# Patient Record
Sex: Male | Born: 1965
Health system: Southern US, Community
[De-identification: ages and names within clinical notes are randomized; demographics above are authoritative.]

## PROBLEM LIST (undated history)

## (undated) DIAGNOSIS — Z720 Tobacco use: Secondary | ICD-10-CM

## (undated) DIAGNOSIS — E785 Hyperlipidemia, unspecified: Secondary | ICD-10-CM

## (undated) DIAGNOSIS — E781 Pure hyperglyceridemia: Secondary | ICD-10-CM

## (undated) DIAGNOSIS — I878 Other specified disorders of veins: Secondary | ICD-10-CM

## (undated) DIAGNOSIS — I10 Essential (primary) hypertension: Secondary | ICD-10-CM

## (undated) DIAGNOSIS — I677 Cerebral arteritis, not elsewhere classified: Secondary | ICD-10-CM

## (undated) DIAGNOSIS — R519 Headache, unspecified: Secondary | ICD-10-CM

## (undated) DIAGNOSIS — I639 Cerebral infarction, unspecified: Secondary | ICD-10-CM

## (undated) DIAGNOSIS — J449 Chronic obstructive pulmonary disease, unspecified: Secondary | ICD-10-CM

## (undated) DIAGNOSIS — F419 Anxiety disorder, unspecified: Secondary | ICD-10-CM

## (undated) DIAGNOSIS — G8929 Other chronic pain: Secondary | ICD-10-CM

## (undated) DIAGNOSIS — R51 Headache: Secondary | ICD-10-CM

## (undated) DIAGNOSIS — G4730 Sleep apnea, unspecified: Secondary | ICD-10-CM

## (undated) DIAGNOSIS — K5190 Ulcerative colitis, unspecified, without complications: Secondary | ICD-10-CM

## (undated) DIAGNOSIS — M1990 Unspecified osteoarthritis, unspecified site: Secondary | ICD-10-CM

## (undated) HISTORY — DX: Pure hyperglyceridemia: E78.1

## (undated) HISTORY — DX: Other specified disorders of veins: I87.8

## (undated) HISTORY — DX: Hyperlipidemia, unspecified: E78.5

## (undated) HISTORY — DX: Headache: R51

## (undated) HISTORY — DX: Tobacco use: Z72.0

## (undated) HISTORY — DX: Other chronic pain: G89.29

## (undated) HISTORY — DX: Headache, unspecified: R51.9

---

## 2001-07-13 ENCOUNTER — Ambulatory Visit (HOSPITAL_COMMUNITY): Admission: RE | Admit: 2001-07-13 | Discharge: 2001-07-13 | Payer: Self-pay | Admitting: Family Medicine

## 2001-07-20 ENCOUNTER — Encounter: Payer: Self-pay | Admitting: *Deleted

## 2001-07-20 ENCOUNTER — Emergency Department (HOSPITAL_COMMUNITY): Admission: EM | Admit: 2001-07-20 | Discharge: 2001-07-20 | Payer: Self-pay | Admitting: Emergency Medicine

## 2001-12-01 ENCOUNTER — Inpatient Hospital Stay (HOSPITAL_COMMUNITY): Admission: RE | Admit: 2001-12-01 | Discharge: 2001-12-04 | Payer: Self-pay | Admitting: Family Medicine

## 2001-12-01 ENCOUNTER — Encounter: Payer: Self-pay | Admitting: Family Medicine

## 2002-07-15 HISTORY — PX: SHOULDER ARTHROSCOPY: SHX128

## 2002-08-12 ENCOUNTER — Encounter: Payer: Self-pay | Admitting: Family Medicine

## 2002-08-12 ENCOUNTER — Ambulatory Visit (HOSPITAL_COMMUNITY): Admission: RE | Admit: 2002-08-12 | Discharge: 2002-08-12 | Payer: Self-pay | Admitting: Family Medicine

## 2002-10-09 ENCOUNTER — Encounter: Admission: RE | Admit: 2002-10-09 | Discharge: 2002-10-09 | Payer: Self-pay | Admitting: Family Medicine

## 2002-10-27 ENCOUNTER — Ambulatory Visit (HOSPITAL_BASED_OUTPATIENT_CLINIC_OR_DEPARTMENT_OTHER): Admission: RE | Admit: 2002-10-27 | Discharge: 2002-10-27 | Payer: Self-pay | Admitting: Orthopedic Surgery

## 2004-03-09 ENCOUNTER — Emergency Department (HOSPITAL_COMMUNITY): Admission: EM | Admit: 2004-03-09 | Discharge: 2004-03-10 | Payer: Self-pay | Admitting: *Deleted

## 2005-02-22 ENCOUNTER — Ambulatory Visit: Payer: Self-pay | Admitting: Gastroenterology

## 2005-07-31 ENCOUNTER — Ambulatory Visit (HOSPITAL_COMMUNITY): Admission: RE | Admit: 2005-07-31 | Discharge: 2005-07-31 | Payer: Self-pay | Admitting: Family Medicine

## 2005-09-05 ENCOUNTER — Ambulatory Visit (HOSPITAL_COMMUNITY): Admission: RE | Admit: 2005-09-05 | Discharge: 2005-09-05 | Payer: Self-pay | Admitting: Family Medicine

## 2005-09-16 ENCOUNTER — Ambulatory Visit: Admission: RE | Admit: 2005-09-16 | Discharge: 2005-09-16 | Payer: Self-pay | Admitting: Orthopedic Surgery

## 2005-11-21 ENCOUNTER — Ambulatory Visit (HOSPITAL_COMMUNITY): Admission: RE | Admit: 2005-11-21 | Discharge: 2005-11-21 | Payer: Self-pay | Admitting: Orthopedic Surgery

## 2006-04-01 ENCOUNTER — Inpatient Hospital Stay (HOSPITAL_COMMUNITY): Admission: RE | Admit: 2006-04-01 | Discharge: 2006-04-02 | Payer: Self-pay | Admitting: Orthopedic Surgery

## 2006-04-01 ENCOUNTER — Encounter (INDEPENDENT_AMBULATORY_CARE_PROVIDER_SITE_OTHER): Payer: Self-pay | Admitting: *Deleted

## 2006-07-15 HISTORY — PX: SHOULDER ARTHROSCOPY: SHX128

## 2006-07-15 HISTORY — PX: CERVICAL SPINE SURGERY: SHX589

## 2006-07-24 ENCOUNTER — Ambulatory Visit: Payer: Self-pay | Admitting: Internal Medicine

## 2006-09-10 ENCOUNTER — Ambulatory Visit (HOSPITAL_COMMUNITY): Admission: RE | Admit: 2006-09-10 | Discharge: 2006-09-10 | Payer: Self-pay | Admitting: Family Medicine

## 2007-04-13 ENCOUNTER — Ambulatory Visit (HOSPITAL_COMMUNITY): Admission: RE | Admit: 2007-04-13 | Discharge: 2007-04-13 | Payer: Self-pay | Admitting: Family Medicine

## 2008-10-17 ENCOUNTER — Ambulatory Visit (HOSPITAL_COMMUNITY): Admission: RE | Admit: 2008-10-17 | Discharge: 2008-10-17 | Payer: Self-pay | Admitting: Family Medicine

## 2010-08-05 ENCOUNTER — Encounter: Payer: Self-pay | Admitting: Orthopedic Surgery

## 2010-09-04 ENCOUNTER — Other Ambulatory Visit (HOSPITAL_COMMUNITY): Payer: Self-pay | Admitting: Family Medicine

## 2010-09-04 DIAGNOSIS — J410 Simple chronic bronchitis: Secondary | ICD-10-CM

## 2010-09-05 ENCOUNTER — Ambulatory Visit (HOSPITAL_COMMUNITY)
Admission: RE | Admit: 2010-09-05 | Discharge: 2010-09-05 | Disposition: A | Discharge status: Home / Self Care | Payer: Medicare Other | Source: Ambulatory Visit | Attending: Family Medicine | Admitting: Family Medicine

## 2010-09-05 DIAGNOSIS — R0602 Shortness of breath: Secondary | ICD-10-CM | POA: Insufficient documentation

## 2010-09-05 DIAGNOSIS — R509 Fever, unspecified: Secondary | ICD-10-CM | POA: Insufficient documentation

## 2010-09-05 DIAGNOSIS — R0789 Other chest pain: Secondary | ICD-10-CM | POA: Insufficient documentation

## 2010-09-05 DIAGNOSIS — R059 Cough, unspecified: Secondary | ICD-10-CM | POA: Insufficient documentation

## 2010-09-05 DIAGNOSIS — J410 Simple chronic bronchitis: Secondary | ICD-10-CM

## 2010-09-05 DIAGNOSIS — R9389 Abnormal findings on diagnostic imaging of other specified body structures: Secondary | ICD-10-CM | POA: Insufficient documentation

## 2010-09-05 DIAGNOSIS — R05 Cough: Secondary | ICD-10-CM | POA: Insufficient documentation

## 2010-09-17 ENCOUNTER — Ambulatory Visit (HOSPITAL_COMMUNITY)
Admission: RE | Admit: 2010-09-17 | Discharge: 2010-09-17 | Disposition: A | Discharge status: Home / Self Care | Payer: Medicare Other | Source: Ambulatory Visit | Attending: Family Medicine | Admitting: Family Medicine

## 2010-09-17 ENCOUNTER — Other Ambulatory Visit (HOSPITAL_COMMUNITY): Payer: Self-pay | Admitting: Family Medicine

## 2010-09-17 DIAGNOSIS — R0602 Shortness of breath: Secondary | ICD-10-CM | POA: Insufficient documentation

## 2010-09-17 DIAGNOSIS — R51 Headache: Secondary | ICD-10-CM | POA: Insufficient documentation

## 2010-09-17 DIAGNOSIS — R05 Cough: Secondary | ICD-10-CM | POA: Insufficient documentation

## 2010-09-17 DIAGNOSIS — R059 Cough, unspecified: Secondary | ICD-10-CM | POA: Insufficient documentation

## 2010-09-17 DIAGNOSIS — R4182 Altered mental status, unspecified: Secondary | ICD-10-CM | POA: Insufficient documentation

## 2010-09-17 DIAGNOSIS — R0600 Dyspnea, unspecified: Secondary | ICD-10-CM

## 2010-09-17 LAB — BLOOD GAS, ARTERIAL
Acid-Base Excess: 4.6 mmol/L — ABNORMAL HIGH (ref 0.0–2.0)
Bicarbonate: 29.3 mEq/L — ABNORMAL HIGH (ref 20.0–24.0)
O2 Saturation: 92.3 %
Patient temperature: 37
TCO2: 25.8 mmol/L (ref 0–100)
pCO2 arterial: 49.3 mmHg — ABNORMAL HIGH (ref 35.0–45.0)
pH, Arterial: 7.391 (ref 7.350–7.450)
pO2, Arterial: 81.7 mmHg (ref 80.0–100.0)

## 2010-09-21 ENCOUNTER — Other Ambulatory Visit (HOSPITAL_COMMUNITY): Payer: Medicare Other

## 2010-11-30 NOTE — H&P (Signed)
[facility]  Patient:    Shaun Harris, Shaun Harris Visit Number: [id number] MRN: [id number]          Service Type: MED Location: 3A A302 01 Attending Physician:  Harlow Asa Dictated by:   Donna Bernard, M.D. Admit Date:  12/01/2001                           History and Physical  CHIEF COMPLAINT:  Sugar is out of control.  Serious dental and gum infection not responding.  HISTORY OF PRESENT ILLNESS:  This patient is a 45 year old white male with a complicated medical history including type 2 diabetes with significant insulin resistance, hypertension, chronic headaches rendering disability, ulcerative colitis, asthma, proteinuria, and hyperlipidemia.  He arrived at the office on the day of admission stating that his sugars have mostly been in the range of 350-500 over the past couple of weeks.  He currently is on Novolin 70/30 up to 100 units q.a.m., 85 units q.h.s.  Despite this, he reports sugars basically through the roof.  For the past month he has been in and out of the dental clinic at [facility] due to a serious gum and dental infection in his upper maxilla.  He is currently on oral clindamycin.  Despite this he continues to manifest infection and ______ until his sugars get in tight control he is going to have ongoing tremendous problems.  The patient claims compliance with his medications.  MEDICATIONS:  1. Asacol 1200 mg b.i.d.  2. D2 200 mg b.i.d.  3. Hydrocodone p.r.n.  4. Glucotrol XL 10 mg b.i.d.  5. Norvasc 5 mg b.i.d.  6. Novolin 70/30 100 units q.a.m., 85 units q.p.m.  7. Magnesium supplement 400 mg daily.  8. Zanaflex 400 mg 2 t.i.d.  9. Lipitor 20 mg daily. 10. Keppra 500 mg 3 b.i.d. 11. Tricor 67 mg b.i.d. 12. Actos 45 mg daily. 13. Xanax 1 mg q.h.s. p.r.n. 14. Hyoscyamine p.r.n.  PAST MEDICAL HISTORY:  Significant for as noted above.  PAST SURGICAL HISTORY:  Recent surgery for gum and maxillary infection.  FAMILY HISTORY:   Positive for hypertension, diabetes, coronary artery disease.  ALLERGIES:  No true allergies.  SOCIAL HISTORY:  The patient is disabled.  Married.  Smokes a pack a day. Three children.  REVIEW OF SYSTEMS:  Otherwise negative.  PHYSICAL EXAMINATION:  VITAL SIGNS:  BP 150/96.  Sugar 320.  GENERAL:  The patient is alert, in moderate malaise.  HEENT:  Mouth dry.  TMs normal.  Fundi, discs sharp.  Gum, inflammation and infection noted.  Pharynx normal.  NECK:  Supple.  LUNGS:  Rare wheeze.  Significant obesity.  HEART:  Regular rate and rhythm.  ABDOMEN:  Soft.  No significant tenderness.  EXTREMITIES:  No edema.  LABORATORY DATA:  Glucose in the office 350.  Other laboratories pending.  IMPRESSION: 1. Type 2 diabetes, basically out of control with tremendous insulin    resistance. 2. Odontogenic infection with patients dental oral surgeons advising the    patient needs to get #1 in control before they can get #2 in control. 3. Hypertension. 4. Chronic disabling headaches. 5. Ulcerative colitis. 6. Hyperlipidemia.  PLAN:  As per orders. Dictated by:   Donna Bernard, M.D. Attending Physician:  Harlow Asa DD:  12/02/01 TD:  12/03/01 Job: [id number]

## 2010-11-30 NOTE — Discharge Summary (Signed)
   NAME:  Shaun Harris, Shaun Harris NO.:  [id number]   MEDICAL RECORD NO.:  [id number]                  PATIENT TYPE:   LOCATION:                                       FACILITY:   PHYSICIAN:  Donna Bernard, M.D.             DATE OF BIRTH:   DATE OF ADMISSION:  DATE OF DISCHARGE:  12/04/2001                                 DISCHARGE SUMMARY   FINAL DIAGNOSES:  1. Direct bilirubin out of control, unresponsive outpatient management.  2. Odontogenic infection.  3. Hypertension.  4. Chronic disabling headaches.   DISPOSITION:  1. The patient discharged to home.  2. Discharge medications:     a. Oral clindamycin as directed.     b. Maintain all chronic medicines.     c. Initiate Lantus q.h.s. 36 units along with sliding scale Humalog for        meals.   ADMISSION HISTORY AND PHYSICAL:  Please see H&P as dictated.   HOSPITAL COURSE:  This patient is a 45 year old white male with history of  complicated type 2 diabetes with significant insulin-resistance,  hypertension, chronic headaches, asthma, who by the office day of admission  with complaints of sugars ranging from 300-500.  He has been in and out with  the dental surgeons due to odontogenic infection.  Currently, he is on oral  clindamycin and they feel until his sugars get better he is going to have no  significant response, as far as his infection.  Based on all this, the  patient was admitted to the hospital.  He was switched over to Lantus at  nighttime, along with Humulog sliding scale.  Within 48-72 hours, his sugars  had improved considerably.  Today, day of discharge, the patient is better  and will be discharged home with diagnosis and disposition as noted above.                                               Donna Bernard, M.D.    Karie Chimera  D:  05/03/2002  T:  05/03/2002  Job:  [id number]

## 2010-11-30 NOTE — Op Note (Signed)
NAME:  Shaun Harris, Shaun Harris NO.:  [id number]   MEDICAL RECORD NO.:  [id number]          PATIENT TYPE:  INP   LOCATION:  5003                         FACILITY:  [facility]   PHYSICIAN:  Nelda Severe, MD      DATE OF BIRTH:  May 29, 1966   DATE OF PROCEDURE:  04/01/2006  DATE OF DISCHARGE:                                 OPERATIVE REPORT   SURGEON:  Nelda Severe, MD.   ASSISTANT:  Lianne Cure, P.A.   PREOPERATIVE DIAGNOSIS:  C6-7 right-sided disk herniation.   POSTOPERATIVE DIAGNOSIS:  C6-7 right-sided foraminal stenosis (hard disk).   OPERATIVE PROCEDURE:  Anterior C6-7 decompression and fusion using an  interbody cage, Vitoss (beta tricalcium phosphate), bone marrow aspirate,  anterior screws and plate.   OPERATIVE NOTE:  The patient was placed under general endotracheal  anesthetic.  No Foley catheter was inserted.  Bilateral sequential  compression devices were placed.  He had received a gram of vancomycin  intravenously.  He was positioned supine on a regular operating table with a  folded sheet under the scapula with the head being supported on foam and  with a donut ring so as to extend the neck.  A cross-table lateral  radiograph was taken prior to preparation and draping with a needle taped to  the skin to establish level.  The decision as to where to make the incision  was somewhat critical in this individual, who has a very thick, short neck.  I knew that with a C6-7 fusion we would be unable to get a satisfactory  lateral radiograph confirming the actual space with a marker in the C6-7  space.  Therefore, we would need to place a marker proximally and then able  to count down.  Therefore, I wanted to be able to expose as far proximally  as C4-5 and, hopefully, C3-4 in order to ensure that a needle could be  placed in the disk and seen on a cross-table lateral.   Therefore, we made a transverse incision to the left side from the midline  laterally a  distance of about 4-5 cm at what was judged to be approximately  the C5-6 level.  The platysma was cut in line with the incision.  Some small  tributaries of the external jugular vein were bipolar cauterized and one  large one ligated with 2-0 Vicryl.  The interval between the strap muscles  of the neck and the sternocleidomastoid was identified and the deep cervical  fascia bluntly dissected.  The carotid sheath was identified and retracted  laterally.  The midline structures were retracted to the right side.  The  longus coli muscles were identified as well as the midline.  We then exposed  somewhat more proximally, placed a needle in the most proximal disk we could  expose and took a cross-table lateral radiograph, which turned out to be C3-  4.  We then counted down and identified the C6-7 level.  Exposure was fairly  difficult because the wound was extremely deep at this point.  This C6-7  level was distal to the  apex of the lordotic curve of the cervical spine.  Eventually Shadow Line self-retaining retractors were adequately placed so  as to expose the annulus of C6-7.  We did take several other x-rays to  confirm that we had counted correctly, etc.  However, never was an x-ray  taken in which a needle was placed in the C6-7 disk space, as this disk  space was literally not visible on a cross-table lateral radiograph because  of overshadowing by his shoulders.   The annulus of the disk was then excised and some of the nucleus curetted  out.  The disk fragments were submitted as specimen.  Ultimately we used a  osteotome to remove the anterior inferior lip of the C6-7 vertebra so as to  gain better exposure towards the back of the disk.  Disk was then enucleated  and removed with pituitary rongeurs and curettes and lateral disk with  Kerrison rongeurs to a point where the disk space became very narrow  posteriorly.  At this point a high-speed bur was used to begin removing bone   primarily from the upper portion of C7 on the right side posteriorly.  It  became evident at this point that there was a large posterior spondylophyte  and that we were drilling very far posteriorly.  Ultimately we were able to  get a nerve hook behind a shell of bone and remove it with a 2-mm Kerrison  rongeur.  We worked our way to the left side and, in fact, the  intervertebral space was significantly shallower on the left.  Ultimately we  removed all of what appeared to be the obstructing the bony structure using  Kerrison rongeurs and doing good foraminal decompression.  We decompressed  back to dura on both sides but did not do a foraminotomy on the left side,  which was asymptomatic.   We also curetted away endplate cartilage from the superior endplate of C7.  We then had two well prepared, raw bleeding bony surfaces on the adjacent  vertebra.   We then chose a 10 mm, 7 degree lordotic intervertebral cage implant  (Invictus), and the trial fit nicely.  At this point another x-ray was taken  with a needle in the immediately proximal disk space C5-6, showing that we  were in the correct disk space.  The x-ray did not reveal the trial at C6-7  however.   We then aspirated approximately 5 mL of bone marrow from the body of C6  using an 18 gauge spinal needle.  This was mixed with 5 mL of beta  tricalcium phosphate (Vitoss).  This was then placed in a 10 mm x 7 degree  cage and the cage impacted carefully and countersunk about 2 mm.   Next a one-level plate was chosen and further contoured and fixed to the  bone of C6 above and C7 below using self-drilling 14 mm screws.  A cross-  table lateral radiograph was taken, but the plate and screws were not  visible.   The wound was then closed in layers using 2-0 interrupted Vicryl suture in  the platysma and 3-0 undyed Vicryl in subcuticular fashion in the skin. Prior to closure a quarter-inch Penrose drain was placed in the depths of   the wound and brought out through the incision medially and secured with a 4-  0 nylon suture.  Skin edges were reinforced with Steri-Strips.  A gauze  bandage was then applied and secured with a towel around the neck.  There were no intraoperative complications.  The blood loss was estimated at  150 mL maximum.  The patient awakened and was able to move all four  extremities to command.  In the recovery room he stated that he did not have  any appreciable pain in his right arm.   Sponge and needle counts were correct.      Nelda Severe, MD  Electronically Signed     MT/MEDQ  D:  04/01/2006  T:  04/02/2006  Job:  [id number]

## 2010-11-30 NOTE — Discharge Summary (Signed)
NAME:  Shaun Harris, DZIEDZIC NO.:  [id number]   MEDICAL RECORD NO.:  [id number]          PATIENT TYPE:  INP   LOCATION:  5003                         FACILITY:  [facility]   PHYSICIAN:  Nelda Severe, MD      DATE OF BIRTH:  11/30/1965   DATE OF ADMISSION:  04/01/2006  DATE OF DISCHARGE:  04/02/2006                                 DISCHARGE SUMMARY   ADMISSION DIAGNOSIS:  C6, C7 herniated nucleus pulposus.   POSTOP DIAGNOSIS:  Cervical C6, C7 fusion anterior cervical spine.   COURSE OF STAY:  Postoperatively, Mr. Cowles was admitted to 5000.  He was  placed on medications as per home use.  He was placed morphine 1 to 4 mg IVP  q.2 hour p.r.n. for pain.  He was also placed on hydrocodone 10 mg/325,  Tylenol 1 to 2 p.o. q.4 hours once on p.o.  There was a Penrose drain placed  on the neck postoperatively and cleaned well.  Dressing was placed over the  dressing and held in place a green surgical sterile towel.  Postoperatively  in the PACU unit, he was actively both bilateral upper and lower  extremities.  Postoperative day 1, patient was ambulating with assistance  from his wife secondary to the IV being connected to him.  He reports  soreness with swallowing.  His speech is clear.  He has been drinking plenty  of fluids over night.  He complains of decreased feeling in the thumb and  pointer finger, which is C5.  No numbness at the level of C6-C7, which was  numb on the right upper extremity prior to surgery.  He states he has  urinated without difficulties.  He is comfortable on pain medicine.  We  ordered an Aspen collar for him to wear when he is out at the house.  He  does have a soft cervical collar at bedside that can be worn for comfort  only, but it is not necessary to wear it at all times.  The Penrose drain  was removed, patient tolerated this well.  A clean, dry dressing was placed  back on his neck.  He and his wife are instructed to change this dressing as  needed  when saturated and he may shower and we want him to followup in  approximately 2 to 3 weeks.  He had hydrocodone at home to be used for pain  control.  He has got 10 of the 650s.  He can take one q.4 p.r.n. for pain  control.  He is welcome to call our office if he needs a refill on his pain  medications.  He had this at home secondary to being treated for headaches  from primary care physician.   DISPOSITION ON DISCHARGE:  Is stable.   DIET:  Advance as tolerate.      Lianne Cure, P.A.      Nelda Severe, MD  Electronically Signed    MC/MEDQ  D:  04/02/2006  T:  04/02/2006  Job:  [id number]

## 2010-11-30 NOTE — Op Note (Signed)
NAME:  Shaun Harris, Shaun Harris                           ACCOUNT NO.:  [id number]   MEDICAL RECORD NO.:  [id number]                   PATIENT TYPE:  AMB   LOCATION:  DSC                                  FACILITY:  [facility]   PHYSICIAN:  Feliberto Gottron. Turner Daniels, M.D.                DATE OF BIRTH:  June 06, 1966   DATE OF PROCEDURE:  10/27/2002  DATE OF DISCHARGE:                                 OPERATIVE REPORT   PREOPERATIVE DIAGNOSES:  Left shoulder impingement with severe  acromioclavicular joint arthritis.   POSTOPERATIVE DIAGNOSES:  Left shoulder impingement with severe  acromioclavicular joint arthritis.   OPERATION PERFORMED:  Left shoulder arthroscopic distal clavicle excision,  anterior-inferior acromioplasty and subacromial bursectomy.   SURGEON:  Feliberto Gottron. Turner Daniels, M.D.   ASSISTANTLaural Benes. Jannet Mantis.   ANESTHESIA:  Attempted interscalene block followed by general endotracheal.   ESTIMATED BLOOD LOSS:  Minimal.   FLUIDS REPLACED:  of crystalloid.   DRAINS:  None.   TOURNIQUET TIME:  None.   INDICATIONS FOR PROCEDURE:  The patient is a 45 year old gentleman with  significant diabetes who has been treated by one of my partners, Otho Darner, M.D. for fairly impressive left shoulder acromioclavicular joint  arthritis.  He only got partial temporary relief from an acromioclavicular  joint injection a few weeks ago, had severe pain that is refractory to anti-  inflammatory medicines, stretching exercises and of course, the cortisone  injections.  Because of severe pain that keeps him up seven of seven nights  per week and because of an MRI scan that clearly shows severe  acromioclavicular joint arthritis which is where he is tender with an intact  rotator cuff, he is taken for arthroscopic distal clavicle excision,  evaluation of the rotator cuff and subacromial decompression.  The risks and  benefits of surgery were understood by the patient.   DESCRIPTION OF PROCEDURE:   The patient was identified by arm band and taken  to the operating room at [facility] where appropriate  anesthetic monitors are attached and an attempt at interscalene block  anesthesia was made.  This failed and he then underwent general endotracheal  anesthesia.  The patient was placed in the beach chair position and the left  upper extremity was prepped and draped in the usual sterile fashion from the  wrist to the hemithorax.  The skin along the anterolateral and posterior  aspects of the acromion process was infiltrated with 3mL of 0.5% Marcaine  with epinephrine solution at each quadrant and then using a #11 blade,  standard portals were made 1.5 cm anterior to the acromioclavicular joint,  lateral to the junction of the middle and posterior thirds of the acromion  and posterior to the posterolateral corner of the acromion process.  The  inflow was placed anteriorly, the arthroscope laterally and a 4.2 great  white sucker  shaver posteriorly allowing visualization of the subacromial  bursa which was then resected.  Small bleeders were cauterized using the  underwater electrocautery.  Using a 4.5 hooded Vortex bur, very significant  subclavicular spurs were then removed and the anterior inferior acromion was  coplaned down to a type 1 acromion.  This allowed better visualization of  the rotator cuff which was noted to be intact consistent with the MRI scan.  The [facility] joint was completely denuded of cartilage, bare bone rubbing on bare  bone and we then removed the distal inferior 1 cm of the distal clavicle  using the posterior portal and then swapped portals bringing the scope in  posteriorly, the Vortex in anteriorly and the water in laterally allowing  completion of the distal clavicle excision and photographic documentation  was made of this as well as the acromioplasty. The arthroscope was then  repositioned into the glenohumeral joint itself using a posterior  portal  where we documented an intact labrum, biceps anchor, biceps tendon, subscap  tendon, supraspinatus and infraspinatus insertions were all noted to be  intact.  The shoulder was then washed out with normal saline solution.  The  arthroscopic instruments were removed.  A dressing of Xeroform, 4 x 4  dressing sponges and paper tape applied, followed by a sling.  Patient  awakened and taken to the recovery room without difficulty.                                                  Feliberto Gottron. Turner Daniels, M.D.    Ovid Curd  D:  10/27/2002  T:  10/27/2002  Job:  [id number]

## 2011-10-01 DIAGNOSIS — E119 Type 2 diabetes mellitus without complications: Secondary | ICD-10-CM | POA: Diagnosis not present

## 2011-10-01 DIAGNOSIS — J449 Chronic obstructive pulmonary disease, unspecified: Secondary | ICD-10-CM | POA: Diagnosis not present

## 2011-10-01 DIAGNOSIS — I10 Essential (primary) hypertension: Secondary | ICD-10-CM | POA: Diagnosis not present

## 2011-10-01 DIAGNOSIS — G8929 Other chronic pain: Secondary | ICD-10-CM | POA: Diagnosis not present

## 2012-01-02 DIAGNOSIS — E119 Type 2 diabetes mellitus without complications: Secondary | ICD-10-CM | POA: Diagnosis not present

## 2012-01-02 DIAGNOSIS — E785 Hyperlipidemia, unspecified: Secondary | ICD-10-CM | POA: Diagnosis not present

## 2012-01-02 DIAGNOSIS — M25569 Pain in unspecified knee: Secondary | ICD-10-CM | POA: Diagnosis not present

## 2012-01-02 DIAGNOSIS — I10 Essential (primary) hypertension: Secondary | ICD-10-CM | POA: Diagnosis not present

## 2012-02-11 ENCOUNTER — Other Ambulatory Visit (HOSPITAL_COMMUNITY): Payer: Self-pay | Admitting: Orthopaedic Surgery

## 2012-02-11 DIAGNOSIS — M25569 Pain in unspecified knee: Secondary | ICD-10-CM

## 2012-02-13 ENCOUNTER — Ambulatory Visit (HOSPITAL_COMMUNITY)
Admission: RE | Admit: 2012-02-13 | Discharge: 2012-02-13 | Disposition: A | Discharge status: Home / Self Care | Payer: Medicare Other | Source: Ambulatory Visit | Attending: Orthopaedic Surgery | Admitting: Orthopaedic Surgery

## 2012-02-13 DIAGNOSIS — M23329 Other meniscus derangements, posterior horn of medial meniscus, unspecified knee: Secondary | ICD-10-CM | POA: Diagnosis not present

## 2012-02-13 DIAGNOSIS — M25569 Pain in unspecified knee: Secondary | ICD-10-CM | POA: Diagnosis not present

## 2012-02-18 DIAGNOSIS — M25569 Pain in unspecified knee: Secondary | ICD-10-CM | POA: Diagnosis not present

## 2012-02-18 DIAGNOSIS — IMO0002 Reserved for concepts with insufficient information to code with codable children: Secondary | ICD-10-CM | POA: Diagnosis not present

## 2012-02-21 ENCOUNTER — Encounter (HOSPITAL_COMMUNITY): Payer: Self-pay

## 2012-02-21 ENCOUNTER — Ambulatory Visit (HOSPITAL_COMMUNITY)
Admission: RE | Admit: 2012-02-21 | Discharge: 2012-02-21 | Disposition: A | Discharge status: Home / Self Care | Payer: Medicare Other | Source: Ambulatory Visit | Attending: Orthopaedic Surgery | Admitting: Orthopaedic Surgery

## 2012-02-21 ENCOUNTER — Encounter (HOSPITAL_COMMUNITY)
Admission: RE | Admit: 2012-02-21 | Discharge: 2012-02-21 | Disposition: A | Discharge status: Home / Self Care | Payer: Medicare Other | Source: Ambulatory Visit | Attending: Orthopaedic Surgery | Admitting: Orthopaedic Surgery

## 2012-02-21 DIAGNOSIS — M23305 Other meniscus derangements, unspecified medial meniscus, unspecified knee: Secondary | ICD-10-CM | POA: Diagnosis not present

## 2012-02-21 DIAGNOSIS — R0902 Hypoxemia: Secondary | ICD-10-CM | POA: Diagnosis not present

## 2012-02-21 DIAGNOSIS — I10 Essential (primary) hypertension: Secondary | ICD-10-CM | POA: Diagnosis not present

## 2012-02-21 DIAGNOSIS — Z01810 Encounter for preprocedural cardiovascular examination: Secondary | ICD-10-CM | POA: Diagnosis not present

## 2012-02-21 DIAGNOSIS — Z794 Long term (current) use of insulin: Secondary | ICD-10-CM | POA: Diagnosis not present

## 2012-02-21 DIAGNOSIS — J449 Chronic obstructive pulmonary disease, unspecified: Secondary | ICD-10-CM | POA: Diagnosis not present

## 2012-02-21 DIAGNOSIS — Z6841 Body Mass Index (BMI) 40.0 and over, adult: Secondary | ICD-10-CM | POA: Diagnosis not present

## 2012-02-21 DIAGNOSIS — E119 Type 2 diabetes mellitus without complications: Secondary | ICD-10-CM | POA: Diagnosis not present

## 2012-02-21 DIAGNOSIS — G4733 Obstructive sleep apnea (adult) (pediatric): Secondary | ICD-10-CM | POA: Diagnosis not present

## 2012-02-21 DIAGNOSIS — Z01812 Encounter for preprocedural laboratory examination: Secondary | ICD-10-CM | POA: Diagnosis not present

## 2012-02-21 DIAGNOSIS — Z79899 Other long term (current) drug therapy: Secondary | ICD-10-CM | POA: Diagnosis not present

## 2012-02-21 HISTORY — DX: Sleep apnea, unspecified: G47.30

## 2012-02-21 HISTORY — DX: Essential (primary) hypertension: I10

## 2012-02-21 HISTORY — DX: Chronic obstructive pulmonary disease, unspecified: J44.9

## 2012-02-21 HISTORY — DX: Ulcerative colitis, unspecified, without complications: K51.90

## 2012-02-21 HISTORY — DX: Anxiety disorder, unspecified: F41.9

## 2012-02-21 LAB — URINALYSIS, ROUTINE W REFLEX MICROSCOPIC
Bilirubin Urine: NEGATIVE
Glucose, UA: 250 mg/dL — AB
Hgb urine dipstick: NEGATIVE
Ketones, ur: NEGATIVE mg/dL
Leukocytes, UA: NEGATIVE
Nitrite: NEGATIVE
Protein, ur: NEGATIVE mg/dL
Specific Gravity, Urine: 1.025 (ref 1.005–1.030)
Urobilinogen, UA: 0.2 mg/dL (ref 0.0–1.0)
pH: 6 (ref 5.0–8.0)

## 2012-02-21 LAB — COMPREHENSIVE METABOLIC PANEL
ALT: 16 U/L (ref 0–53)
AST: 15 U/L (ref 0–37)
Albumin: 4 g/dL (ref 3.5–5.2)
Alkaline Phosphatase: 97 U/L (ref 39–117)
BUN: 18 mg/dL (ref 6–23)
CO2: 31 mEq/L (ref 19–32)
Calcium: 9.5 mg/dL (ref 8.4–10.5)
Chloride: 100 mEq/L (ref 96–112)
Creatinine, Ser: 0.81 mg/dL (ref 0.50–1.35)
GFR calc Af Amer: >90 mL/min (ref >90)
GFR calc non Af Amer: >90 mL/min (ref >90)
Glucose, Bld: 201 mg/dL — ABNORMAL HIGH (ref 70–99)
Potassium: 4.8 mEq/L (ref 3.5–5.1)
Sodium: 139 mEq/L (ref 135–145)
Total Bilirubin: 0.2 mg/dL — ABNORMAL LOW (ref 0.3–1.2)
Total Protein: 6.7 g/dL (ref 6.0–8.3)

## 2012-02-21 LAB — CBC
HCT: 44 % (ref 39.0–52.0)
Hemoglobin: 14.4 g/dL (ref 13.0–17.0)
MCH: 30.3 pg (ref 26.0–34.0)
MCHC: 32.7 g/dL (ref 30.0–36.0)
MCV: 92.6 fL (ref 78.0–100.0)
Platelets: 200 10*3/uL (ref 150–400)
RBC: 4.75 MIL/uL (ref 4.22–5.81)
RDW: 13.6 % (ref 11.5–15.5)
WBC: 7.4 10*3/uL (ref 4.0–10.5)

## 2012-02-21 LAB — SURGICAL PCR SCREEN
MRSA, PCR: NEGATIVE
Staphylococcus aureus: NEGATIVE

## 2012-02-21 NOTE — Patient Instructions (Addendum)
20 Shaun Harris  02/21/2012   Your procedure is scheduled on:  02/25/2012  Report to [facility] at 7:15 am AM.  Call this number if you have problems the morning of surgery: [phone]   Remember:   Do not eat food:After Midnight.  May have clear liquids:until Midnight .  Clear liquids include soda, tea, black coffee, apple or grape juice, broth.  Take these medicines the morning of surgery with A SIP OF WATER: Vasotec, Advair, Xanax, Keppra Hytrin, Pro Air, Lantus (take 1/2 the dosage the night before)   Do not wear jewelry, make-up or nail polish.  Do not wear lotions, powders, or perfumes. You may wear deodorant.  Do not shave 48 hours prior to surgery. Men may shave face and neck.  Do not bring valuables to the hospital.  Contacts, dentures or bridgework may not be worn into surgery.  Leave suitcase in the car. After surgery it may be brought to your room.  For patients admitted to the hospital, checkout time is 11:00 AM the day of discharge.   Patients discharged the day of surgery will not be allowed to drive home.  Name and phone number of your driver: Wife Shaun Harris  [phone]  Special Instructions: CHG Shower Use Special Wash: 1/2 bottle night before surgery and 1/2 bottle morning of surgery.   Please read over the following fact sheets that you were given: Care and Recovery After Surgery

## 2012-02-24 NOTE — H&P (Signed)
Shaun Harris is an 46 y.o. male.   Chief Complaint: Right knee pain HPI: He has had right knee pain since January, getting worse.  He has had swelling and popping of the right knee.  It started giving way several months ago.  I first saw him in the office end of July of this year.  I was concerned about a medial meniscus tear.  MRI done on August 1st shows a tear of the medial meniscus of the right knee as well as degenerative changes of the joint.  I have recommended arthroscopy of the right knee.  Risks and imponderables were discussed.  He appeared to understand and agrees to the procedure as an outpatient.  PT will be done post op.  Past Medical History  Diagnosis Date  . Hypertension   . Diabetes mellitus   . COPD (chronic obstructive pulmonary disease)   . Anxiety   . Sleep apnea   . Ulcerative colitis     Past Surgical History  Procedure Date  . Cervical spine surgery 2008  . Shoulder arthroscopy 2004    left shoulder  . Shoulder arthroscopy 2008    right shoulder    No family history on file. Social History:  reports that he has been smoking Cigarettes.  He has a 70 pack-year smoking history. He does not have any smokeless tobacco history on file. He reports that he does not drink alcohol or use illicit drugs.  Allergies: No Known Allergies  No prescriptions prior to admission    No results found for this or any previous visit (from the past 48 hour(s)). No results found.  Review of Systems  Constitutional: Negative.   HENT: Negative.   Eyes: Negative.   Respiratory:       History of COPD  Cardiovascular:       History of hypertension  Gastrointestinal: Negative.   Musculoskeletal: Positive for joint pain (Pain right knee with giving way and popping and swelling.  He has no locking or redness.).  Skin: Negative.   Endo/Heme/Allergies:       History of diabetes.    There were no vitals taken for this visit. Physical Exam  Constitutional: He appears  well-developed and well-nourished.  HENT:  Head: Normocephalic and atraumatic.  Eyes: Conjunctivae and EOM are normal. Pupils are equal, round, and reactive to light.  Neck: Normal range of motion. Neck supple.  Cardiovascular: Normal rate, regular rhythm, normal heart sounds and intact distal pulses.   Respiratory: Effort normal and breath sounds normal.  GI: Soft. Bowel sounds are normal.  Musculoskeletal: He exhibits tenderness (Pain right knee, swelling, medial tenderness.).       Right knee: He exhibits decreased range of motion, swelling and effusion.       Legs: Neurological: He is alert. He has normal reflexes.  Skin: Skin is warm and dry.  Psychiatric: He has a normal mood and affect. His behavior is normal. Judgment and thought content normal.     Assessment/Plan Tear of the right knee medial meniscus.  History of diabetes.  For arthroscopy of the right knee as an outpatient.  Physical therapy scheduled later in the week.  Emily Massar 02/24/2012, 2:52 PM

## 2012-02-25 ENCOUNTER — Encounter (HOSPITAL_COMMUNITY): Payer: Self-pay | Admitting: Anesthesiology

## 2012-02-25 ENCOUNTER — Ambulatory Visit (HOSPITAL_COMMUNITY)
Admission: RE | Admit: 2012-02-25 | Discharge: 2012-02-25 | Disposition: A | Discharge status: Home / Self Care | Payer: Medicare Other | Source: Ambulatory Visit | Attending: Orthopaedic Surgery | Admitting: Orthopaedic Surgery

## 2012-02-25 ENCOUNTER — Encounter (HOSPITAL_COMMUNITY): Payer: Self-pay

## 2012-02-25 ENCOUNTER — Ambulatory Visit (HOSPITAL_COMMUNITY): Payer: Medicare Other | Admitting: Anesthesiology

## 2012-02-25 ENCOUNTER — Ambulatory Visit (HOSPITAL_COMMUNITY)
Admission: RE | Admit: 2012-02-25 | Discharge: 2012-02-25 | Disposition: A | Discharge status: Home / Self Care | Payer: Medicare Other | Source: Ambulatory Visit | Attending: Family Medicine | Admitting: Family Medicine

## 2012-02-25 ENCOUNTER — Encounter (HOSPITAL_COMMUNITY)
Admission: RE | Disposition: A | Discharge status: Home / Self Care | Payer: Self-pay | Source: Ambulatory Visit | Attending: Orthopaedic Surgery

## 2012-02-25 DIAGNOSIS — I10 Essential (primary) hypertension: Secondary | ICD-10-CM | POA: Diagnosis not present

## 2012-02-25 DIAGNOSIS — Z01810 Encounter for preprocedural cardiovascular examination: Secondary | ICD-10-CM | POA: Diagnosis not present

## 2012-02-25 DIAGNOSIS — IMO0001 Reserved for inherently not codable concepts without codable children: Secondary | ICD-10-CM | POA: Insufficient documentation

## 2012-02-25 DIAGNOSIS — M25669 Stiffness of unspecified knee, not elsewhere classified: Secondary | ICD-10-CM | POA: Insufficient documentation

## 2012-02-25 DIAGNOSIS — IMO0002 Reserved for concepts with insufficient information to code with codable children: Secondary | ICD-10-CM | POA: Diagnosis not present

## 2012-02-25 DIAGNOSIS — Z79899 Other long term (current) drug therapy: Secondary | ICD-10-CM | POA: Insufficient documentation

## 2012-02-25 DIAGNOSIS — M23305 Other meniscus derangements, unspecified medial meniscus, unspecified knee: Secondary | ICD-10-CM | POA: Diagnosis not present

## 2012-02-25 DIAGNOSIS — J449 Chronic obstructive pulmonary disease, unspecified: Secondary | ICD-10-CM | POA: Insufficient documentation

## 2012-02-25 DIAGNOSIS — M25569 Pain in unspecified knee: Secondary | ICD-10-CM | POA: Insufficient documentation

## 2012-02-25 DIAGNOSIS — M6281 Muscle weakness (generalized): Secondary | ICD-10-CM | POA: Diagnosis not present

## 2012-02-25 DIAGNOSIS — M23302 Other meniscus derangements, unspecified lateral meniscus, unspecified knee: Secondary | ICD-10-CM | POA: Diagnosis not present

## 2012-02-25 DIAGNOSIS — G4733 Obstructive sleep apnea (adult) (pediatric): Secondary | ICD-10-CM | POA: Insufficient documentation

## 2012-02-25 DIAGNOSIS — J4489 Other specified chronic obstructive pulmonary disease: Secondary | ICD-10-CM | POA: Insufficient documentation

## 2012-02-25 DIAGNOSIS — Z794 Long term (current) use of insulin: Secondary | ICD-10-CM | POA: Insufficient documentation

## 2012-02-25 DIAGNOSIS — R269 Unspecified abnormalities of gait and mobility: Secondary | ICD-10-CM | POA: Diagnosis not present

## 2012-02-25 DIAGNOSIS — Z6841 Body Mass Index (BMI) 40.0 and over, adult: Secondary | ICD-10-CM | POA: Insufficient documentation

## 2012-02-25 DIAGNOSIS — Z01812 Encounter for preprocedural laboratory examination: Secondary | ICD-10-CM | POA: Insufficient documentation

## 2012-02-25 DIAGNOSIS — E119 Type 2 diabetes mellitus without complications: Secondary | ICD-10-CM | POA: Insufficient documentation

## 2012-02-25 HISTORY — PX: KNEE ARTHROSCOPY: SHX127

## 2012-02-25 LAB — GLUCOSE, CAPILLARY
Glucose-Capillary: 97 mg/dL (ref 70–99)
Glucose-Capillary: 121 mg/dL — ABNORMAL HIGH (ref 70–99)

## 2012-02-25 SURGERY — ARTHROSCOPY, KNEE
Anesthesia: General | Site: Knee | Laterality: Right | Wound class: Clean

## 2012-02-25 MED ORDER — LACTATED RINGERS IV SOLN
INTRAVENOUS | Status: DC
  Administered 2012-02-25: 08:00:00 via INTRAVENOUS

## 2012-02-25 MED ORDER — DEXAMETHASONE SODIUM PHOSPHATE 4 MG/ML IJ SOLN
INTRAMUSCULAR | Status: AC
  Filled 2012-02-25: qty 1

## 2012-02-25 MED ORDER — GLYCOPYRROLATE 0.2 MG/ML IJ SOLN
INTRAMUSCULAR | Status: AC
  Filled 2012-02-25: qty 1

## 2012-02-25 MED ORDER — LACTATED RINGERS IV SOLN
INTRAVENOUS | Status: DC | PRN
  Administered 2012-02-25 (×2): via INTRAVENOUS

## 2012-02-25 MED ORDER — DEXAMETHASONE SODIUM PHOSPHATE 4 MG/ML IJ SOLN
4.0000 mg | Freq: Once | INTRAMUSCULAR | Status: AC
  Administered 2012-02-25: 4 mg via INTRAVENOUS

## 2012-02-25 MED ORDER — BUPIVACAINE HCL (PF) 0.25 % IJ SOLN
INTRAMUSCULAR | Status: DC | PRN
  Administered 2012-02-25: 30 mL

## 2012-02-25 MED ORDER — BUPIVACAINE HCL (PF) 0.25 % IJ SOLN
INTRAMUSCULAR | Status: AC
  Filled 2012-02-25: qty 30

## 2012-02-25 MED ORDER — FENTANYL CITRATE 0.05 MG/ML IJ SOLN
INTRAMUSCULAR | Status: DC | PRN
  Administered 2012-02-25 (×4): 50 ug via INTRAVENOUS

## 2012-02-25 MED ORDER — MIDAZOLAM HCL 2 MG/2ML IJ SOLN
INTRAMUSCULAR | Status: AC
  Filled 2012-02-25: qty 2

## 2012-02-25 MED ORDER — FENTANYL CITRATE 0.05 MG/ML IJ SOLN
INTRAMUSCULAR | Status: AC
  Filled 2012-02-25: qty 2

## 2012-02-25 MED ORDER — GLYCOPYRROLATE 0.2 MG/ML IJ SOLN
0.2000 mg | Freq: Once | INTRAMUSCULAR | Status: AC
  Administered 2012-02-25: 0.2 mg via INTRAVENOUS

## 2012-02-25 MED ORDER — PROPOFOL 10 MG/ML IV EMUL
INTRAVENOUS | Status: AC
  Filled 2012-02-25: qty 20

## 2012-02-25 MED ORDER — PROPOFOL 10 MG/ML IV EMUL
INTRAVENOUS | Status: DC | PRN
  Administered 2012-02-25: 200 mg via INTRAVENOUS

## 2012-02-25 MED ORDER — MIDAZOLAM HCL 2 MG/2ML IJ SOLN
1.0000 mg | INTRAMUSCULAR | Status: DC | PRN
  Administered 2012-02-25: 2 mg via INTRAVENOUS

## 2012-02-25 MED ORDER — ONDANSETRON HCL 4 MG/2ML IJ SOLN
4.0000 mg | Freq: Once | INTRAMUSCULAR | Status: AC
  Administered 2012-02-25: 4 mg via INTRAVENOUS

## 2012-02-25 MED ORDER — LIDOCAINE HCL (PF) 1 % IJ SOLN
INTRAMUSCULAR | Status: AC
  Filled 2012-02-25: qty 5

## 2012-02-25 MED ORDER — OXYCODONE-ACETAMINOPHEN 7.5-325 MG PO TABS: 1.0000 | ORAL_TABLET | ORAL | Status: AC | PRN

## 2012-02-25 MED ORDER — SODIUM CHLORIDE 0.9 % IR SOLN
Status: DC | PRN
  Administered 2012-02-25: 1000 mL

## 2012-02-25 MED ORDER — SUCCINYLCHOLINE CHLORIDE 20 MG/ML IJ SOLN
INTRAMUSCULAR | Status: DC | PRN
  Administered 2012-02-25: 160 mg via INTRAVENOUS

## 2012-02-25 MED ORDER — ONDANSETRON HCL 4 MG/2ML IJ SOLN: 4.0000 mg | Freq: Once | INTRAMUSCULAR | Status: DC | PRN

## 2012-02-25 MED ORDER — FENTANYL CITRATE 0.05 MG/ML IJ SOLN
25.0000 ug | INTRAMUSCULAR | Status: DC | PRN
  Administered 2012-02-25 (×4): 50 ug via INTRAVENOUS

## 2012-02-25 MED ORDER — SUCCINYLCHOLINE CHLORIDE 20 MG/ML IJ SOLN
INTRAMUSCULAR | Status: AC
  Filled 2012-02-25: qty 1

## 2012-02-25 MED ORDER — ONDANSETRON HCL 4 MG/2ML IJ SOLN
INTRAMUSCULAR | Status: AC
  Filled 2012-02-25: qty 2

## 2012-02-25 MED ORDER — GLYCOPYRROLATE 0.2 MG/ML IJ SOLN
INTRAMUSCULAR | Status: DC | PRN
  Administered 2012-02-25: 0.2 mg via INTRAVENOUS

## 2012-02-25 MED ORDER — LIDOCAINE HCL (CARDIAC) 10 MG/ML IV SOLN
INTRAVENOUS | Status: DC | PRN
  Administered 2012-02-25: 50 mg via INTRAVENOUS

## 2012-02-25 MED ORDER — LACTATED RINGERS IR SOLN
Status: DC | PRN
  Administered 2012-02-25 (×2): 3000 mL

## 2012-02-25 MED ORDER — ARTIFICIAL TEARS OP OINT
TOPICAL_OINTMENT | OPHTHALMIC | Status: AC
  Filled 2012-02-25: qty 3.5

## 2012-02-25 SURGICAL SUPPLY — 59 items
4319 ×3 IMPLANT
BAG HAMPER (MISCELLANEOUS) ×3 IMPLANT
BANDAGE ELASTIC 6 VELCRO NS (GAUZE/BANDAGES/DRESSINGS) ×3 IMPLANT
BANDAGE ESMARK 4X12 BL STRL LF (DISPOSABLE) ×2 IMPLANT
BLADE SURG 15 STRL LF DISP TIS (BLADE) ×2 IMPLANT
BLADE SURG 15 STRL SS (BLADE) ×1
BLADE SURG SZ11 CARB STEEL (BLADE) ×3 IMPLANT
BNDG ESMARK 4X12 BLUE STRL LF (DISPOSABLE) ×3
CLOTH BEACON ORANGE TIMEOUT ST (SAFETY) ×3 IMPLANT
CUFF TOURNIQUET SINGLE 34IN LL (TOURNIQUET CUFF) IMPLANT
CUFF TOURNIQUET SINGLE 44IN (TOURNIQUET CUFF) ×3 IMPLANT
CUTTER TOMCAT 4.0M (BURR) ×3 IMPLANT
CUTTER TOMCAT 5.0MM (BURR) ×3 IMPLANT
DECANTER SPIKE VIAL GLASS SM (MISCELLANEOUS) ×3 IMPLANT
DRAPE PROXIMA HALF (DRAPES) ×3 IMPLANT
DRSG XEROFORM 1X8 (GAUZE/BANDAGES/DRESSINGS) ×3 IMPLANT
DURAPREP 26ML APPLICATOR (WOUND CARE) ×3 IMPLANT
ELECT MENISCUS 165MM 90D (ELECTRODE) IMPLANT
FORMALIN 10 PREFIL 480ML (MISCELLANEOUS) ×3 IMPLANT
GAUZE SPONGE 4X4 16PLY XRAY LF (GAUZE/BANDAGES/DRESSINGS) ×3 IMPLANT
GAUZE XEROFORM 5X9 LF (GAUZE/BANDAGES/DRESSINGS) ×3 IMPLANT
GLOVE BIO SURGEON STRL SZ8 (GLOVE) ×3 IMPLANT
GLOVE BIO SURGEON STRL SZ8.5 (GLOVE) ×3 IMPLANT
GLOVE BIOGEL PI IND STRL 7.0 (GLOVE) ×2 IMPLANT
GLOVE BIOGEL PI INDICATOR 7.0 (GLOVE) ×1
GLOVE EXAM NITRILE MD LF STRL (GLOVE) ×3 IMPLANT
GLOVE OPTIFIT SS 6.5 STRL BRWN (GLOVE) ×3 IMPLANT
GOWN STRL REIN XL XLG (GOWN DISPOSABLE) ×6 IMPLANT
HANDPIECE (MISCELLANEOUS) IMPLANT
HLDR LEG FOAM (MISCELLANEOUS) ×2 IMPLANT
IV LACTATED RINGER IRRG 3000ML (IV SOLUTION) ×2
IV LR IRRIG 3000ML ARTHROMATIC (IV SOLUTION) ×4 IMPLANT
KIT BLADEGUARD II DBL (SET/KITS/TRAYS/PACK) ×3 IMPLANT
KIT ROOM TURNOVER AP CYSTO (KITS) ×3 IMPLANT
KNIFE HOOK (BLADE) IMPLANT
KNIFE MENISECTOMY (BLADE) IMPLANT
LEG HOLDER FOAM (MISCELLANEOUS) ×1
MANIFOLD NEPTUNE II (INSTRUMENTS) ×3 IMPLANT
MANIFOLD NEPTUNE WASTE (CANNULA) ×3 IMPLANT
MARKER SKIN DUAL TIP RULER LAB (MISCELLANEOUS) ×3 IMPLANT
NEEDLE HYPO 21X1.5 SAFETY (NEEDLE) ×3 IMPLANT
NEEDLE SPNL 18GX3.5 QUINCKE PK (NEEDLE) ×3 IMPLANT
NS IRRIG 1000ML POUR BTL (IV SOLUTION) ×3 IMPLANT
PACK ARTHRO LIMB DRAPE STRL (MISCELLANEOUS) ×3 IMPLANT
PAD ABD 5X9 TENDERSORB (GAUZE/BANDAGES/DRESSINGS) ×3 IMPLANT
PAD ARMBOARD 7.5X6 YLW CONV (MISCELLANEOUS) ×3 IMPLANT
PADDING CAST COTTON 6X4 STRL (CAST SUPPLIES) ×3 IMPLANT
SET ARTHROSCOPY INST (INSTRUMENTS) ×3 IMPLANT
SET BASIN LINEN APH (SET/KITS/TRAYS/PACK) ×3 IMPLANT
SPONGE GAUZE 4X4 12PLY (GAUZE/BANDAGES/DRESSINGS) ×3 IMPLANT
SUT ETHILON 3 0 FSL (SUTURE) ×3 IMPLANT
SYR 30ML LL (SYRINGE) ×3 IMPLANT
TIP 0 DEGREE (MISCELLANEOUS) IMPLANT
TIP 30 DEGREE (MISCELLANEOUS) IMPLANT
TIP 70 DEGREE (MISCELLANEOUS) IMPLANT
TOWEL OR 17X26 4PK STRL BLUE (TOWEL DISPOSABLE) ×3 IMPLANT
TUBING FLOSTEADY ARTHRO PUMP (IRRIGATION / IRRIGATOR) ×3 IMPLANT
WATER STERILE IRR 1000ML POUR (IV SOLUTION) ×3 IMPLANT
YANKAUER SUCT BULB TIP 10FT TU (MISCELLANEOUS) ×6 IMPLANT

## 2012-02-25 NOTE — Preoperative (Signed)
Beta Blockers   Reason not to administer Beta Blockers:Not Applicable 

## 2012-02-25 NOTE — Progress Notes (Signed)
The History and Physical is unchanged. I have examined the patient. The patient is medically able to have surgery on the right knee . Shaun Harris 

## 2012-02-25 NOTE — Brief Op Note (Signed)
02/25/2012  9:30 AM  PATIENT:  Shaun Harris  46 y.o. male  PRE-OPERATIVE DIAGNOSIS:  tear medial meniscus right knee  POST-OPERATIVE DIAGNOSIS:  tear medial meniscus right knee  PROCEDURE:  Procedure(s) (LRB): ARTHROSCOPY KNEE (Right) partial right medial menisectomy  SURGEON:  Surgeon(s) and Role:    * Darreld Mclean, MD - Primary  PHYSICIAN ASSISTANT:   ASSISTANTS: none   ANESTHESIA:   general  EBL:  Total I/O In: 700 [I.V.:700] Out: 0   BLOOD ADMINISTERED:none  DRAINS: none   LOCAL MEDICATIONS USED:  MARCAINE     SPECIMEN:  Source of Specimen:  right knee medial meniscus shavings  DISPOSITION OF SPECIMEN:  PATHOLOGY  COUNTS:  YES  TOURNIQUET:   Total Tourniquet Time Documented: Thigh (Right) - 18 minutes  DICTATION: .Other Dictation: Dictation Number [id number]  PLAN OF CARE: Discharge to home after PACU  PATIENT DISPOSITION:  PACU - hemodynamically stable.   Delay start of Pharmacological VTE agent (>24hrs) due to surgical blood loss or risk of bleeding: not applicable

## 2012-02-25 NOTE — Progress Notes (Signed)
Physical Therapy gait training note  S: Pt reports that he is a little sore, but is aware of his current situation.  O: Pt is present with wife (AMY).  Level entrance into home (via ramp) Gait training w/ Bilateral axillary crutches x10 minutes for set up and instruction.   A:Pt able to independently demonstrate proper step to pattern w/RLE.  Able to ambulate x50' w/bilateral axillary crutches w/min cueing.   P: All education complete, pt is d/c home w/wife.

## 2012-02-25 NOTE — Anesthesia Preprocedure Evaluation (Signed)
Anesthesia Evaluation  Patient identified by MRN, date of birth, ID band Patient awake    Reviewed: Allergy & Precautions, H&P , NPO status , Patient's Chart, lab work & pertinent test results  History of Anesthesia Complications (+) DIFFICULT AIRWAY  Airway Mallampati: IV TM Distance: <3 FB   Mouth opening: Limited Mouth Opening  Dental  (+) Partial Upper   Pulmonary sleep apnea and Continuous Positive Airway Pressure Ventilation , COPDCurrent Smoker (2 ppd),    Pulmonary exam normal       Cardiovascular Rhythm:Regular     Neuro/Psych Anxiety    GI/Hepatic   Endo/Other  Poorly Controlled, Type 2, Oral Hypoglycemic Agents and Insulin DependentMorbid obesity  Renal/GU      Musculoskeletal   Abdominal   Peds  Hematology   Anesthesia Other Findings   Reproductive/Obstetrics                           Anesthesia Physical Anesthesia Plan  ASA: III  Anesthesia Plan: General   Post-op Pain Management:    Induction: Intravenous, Rapid sequence and Cricoid pressure planned  Airway Management Planned: Oral ETT and Video Laryngoscope Planned  Additional Equipment:   Intra-op Plan:   Post-operative Plan: Extubation in OR  Informed Consent: I have reviewed the patients History and Physical, chart, labs and discussed the procedure including the risks, benefits and alternatives for the proposed anesthesia with the patient or authorized representative who has indicated his/her understanding and acceptance.     Plan Discussed with:   Anesthesia Plan Comments:         Anesthesia Quick Evaluation

## 2012-02-25 NOTE — Anesthesia Postprocedure Evaluation (Signed)
  Anesthesia Post-op Note  Patient: Shaun Harris  Procedure(s) Performed: Procedure(s) (LRB): ARTHROSCOPY KNEE (Right)  Patient Location: PACU  Anesthesia Type: General  Level of Consciousness: awake, alert , oriented and patient cooperative  Airway and Oxygen Therapy: Patient Spontanous Breathing and Patient connected to face mask oxygen  Post-op Pain: mild  Post-op Assessment: Post-op Vital signs reviewed, Patient's Cardiovascular Status Stable, Respiratory Function Stable, Patent Airway and No signs of Nausea or vomiting  Post-op Vital Signs: Reviewed and stable  Complications: No apparent anesthesia complications

## 2012-02-25 NOTE — Anesthesia Procedure Notes (Signed)
Procedure Name: Intubation Date/Time: 02/25/2012 8:49 AM Performed by: Carolyne Littles, AMY L Pre-anesthesia Checklist: Patient identified, Patient being monitored, Timeout performed, Emergency Drugs available and Suction available Patient Re-evaluated:Patient Re-evaluated prior to inductionOxygen Delivery Method: Circle System Utilized Preoxygenation: Pre-oxygenation with 100% oxygen Intubation Type: IV induction and Rapid sequence Grade View: Grade I Tube type: Oral Tube size: 8.0 mm Number of attempts: 1 Airway Equipment and Method: stylet and Video-laryngoscopy Placement Confirmation: ETT inserted through vocal cords under direct vision,  positive ETCO2 and breath sounds checked- equal and bilateral Secured at: 21 cm Tube secured with: Tape Dental Injury: Teeth and Oropharynx as per pre-operative assessment  Difficulty Due To: Difficulty was anticipated

## 2012-02-25 NOTE — Transfer of Care (Signed)
Immediate Anesthesia Transfer of Care Note  Patient: Shaun Harris  Procedure(s) Performed: Procedure(s) (LRB): ARTHROSCOPY KNEE (Right)  Patient Location: PACU  Anesthesia Type: General  Level of Consciousness: awake, alert , oriented and patient cooperative  Airway & Oxygen Therapy: Patient Spontanous Breathing and Patient connected to face mask oxygen  Post-op Assessment: Report given to PACU RN and Post -op Vital signs reviewed and stable  Post vital signs: Reviewed and stable  Complications: No apparent anesthesia complications

## 2012-02-26 NOTE — Op Note (Signed)
NAME:  Shaun Harris, Shaun Harris                 ACCOUNT NO.:  [id number]  MEDICAL RECORD NO.:  [id number]  LOCATION:  [facility]                          FACILITY:  [facility]  PHYSICIAN:  J. Darreld Mclean, M.D. DATE OF BIRTH:  [birth year]  DATE OF PROCEDURE:  02/25/2012 DATE OF DISCHARGE:  02/25/2012                              OPERATIVE REPORT   PREOPERATIVE DIAGNOSIS:  Tear of medial meniscus, right knee.  POSTOPERATIVE DIAGNOSIS:  Tear of medial meniscus, right knee.  PROCEDURE:  Operative arthroscopy of the right knee, partial medial meniscectomy.  ANESTHESIA:  General.  TOURNIQUET TIME:  16 minutes.  SURGEON:  J. Darreld Mclean, M.D.  DRAINS:  No drains.  INDICATIONS:  Patient has trouble with his knee since January of this year.  It progressively got worse.  I first saw him in my office in end of July.  I thought he had a medial meniscal tear by examination and by history.  He had an MRI that showed tear of medial meniscus, posterior horn, and some of the anterior horn.  Surgery was recommended.  I went over the risks and imponderables of the procedure in the office.  He appeared to understand.  He asked appropriate questions.  DESCRIPTION OF PROCEDURE:  The patient was seen in the holding area and identified the right knee as the correct surgical site.  I placed a mark on the right knee.  He was brought to the operating room and given general anesthesia while supine.  Tourniquet and leg holder applied to the left upper thigh after anesthesia was obtained.  He was prepped and draped in usual manner.  A generalized time-out identifying [name] as the patient and his right knee for medial meniscectomy.  All instrumentations were working properly in the room and the OR knew each other.  Leg was elevated, wrapped circumferentially with an Esmarch bandage. Tourniquet inflated to 300 mmHg.  Esmarch bandage was removed.  Inflow cannula was inserted medially.  Lactated Ringer's instilled into  the knee by an infusion pump.  Arthroscope inserted laterally and the knee systematically examined.  Suprapatellar pouch looked good, and grade 2 changes around the patella.  Medially, there was a tear in the anterior and posterior horns with grade 3 changes of femoral condyle and tibial plateau.  Anterior cruciate was intact laterally.  The meniscus with some slight fraying but grade 2 changes of the articular surface. Attention was directed back to the medial side.  Using a meniscal punch and a shaver, good smooth contour was obtained.  Specimen was sent to pathology.  Knee was systematically reexamined and no new pathology found.  Permanent pictures were taken throughout this entire case.  The knee was irrigated with remaining part of lactated Ringer's.  The wound was reapproximated using 3-0 nylon interrupted vertical mattress manner. Marcaine 0.25% was instilled into each portal.  The patient tolerated the procedure well.  Total tourniquet time was 16 minutes.  To recovery in good condition, will be given appropriate analgesia, and I will see him in the office in approximately 10 days to 2 weeks.  Physical therapy has been setup as an outpatient.  If any difficulties, contact me through the  office or hospital beeper system.          ______________________________ Shela Commons. Darreld Mclean, M.D.     JWK/MEDQ  D:  02/25/2012  T:  02/26/2012  Job:  [id number]

## 2012-03-02 ENCOUNTER — Ambulatory Visit (HOSPITAL_COMMUNITY)
Admission: RE | Admit: 2012-03-02 | Discharge: 2012-03-02 | Disposition: A | Discharge status: Home / Self Care | Payer: Medicare Other | Source: Ambulatory Visit | Attending: Orthopaedic Surgery | Admitting: Orthopaedic Surgery

## 2012-03-02 ENCOUNTER — Encounter (HOSPITAL_COMMUNITY): Payer: Self-pay | Admitting: Orthopaedic Surgery

## 2012-03-02 DIAGNOSIS — M25569 Pain in unspecified knee: Secondary | ICD-10-CM | POA: Insufficient documentation

## 2012-03-02 DIAGNOSIS — M25669 Stiffness of unspecified knee, not elsewhere classified: Secondary | ICD-10-CM | POA: Diagnosis not present

## 2012-03-02 DIAGNOSIS — M6281 Muscle weakness (generalized): Secondary | ICD-10-CM | POA: Diagnosis not present

## 2012-03-02 DIAGNOSIS — IMO0001 Reserved for inherently not codable concepts without codable children: Secondary | ICD-10-CM | POA: Diagnosis not present

## 2012-03-02 DIAGNOSIS — R269 Unspecified abnormalities of gait and mobility: Secondary | ICD-10-CM | POA: Diagnosis not present

## 2012-03-02 NOTE — Evaluation (Signed)
Physical Therapy Evaluation  Patient Details  Name: Shaun Harris MRN: [id number] Date of Birth: 03/28/1966  Today's Date: 03/02/2012 Time: 4401-0272 PT Time Calculation (min): 41 min Charges: 1 eval Visit#: 1  of 8   Re-eval: 04/01/12 Assessment Diagnosis: L knee scope  Surgical Date: 02/25/12 Next MD Visit: Dr. Hilda Lias - 03/06/12 Prior Therapy: None  Authorization: MEDICARE  Authorization Time Period: MOBILITY: G CODE: CURRENT: CL, GOAL: CJ  Authorization Visit#: 1  of 10    Past Medical History:  Past Medical History  Diagnosis Date  . Hypertension   . Diabetes mellitus   . COPD (chronic obstructive pulmonary disease)   . Anxiety   . Sleep apnea   . Ulcerative colitis    Past Surgical History:  Past Surgical History  Procedure Date  . Cervical spine surgery 2008  . Shoulder arthroscopy 2004    left shoulder  . Shoulder arthroscopy 2008    right shoulder  . Knee arthroscopy 02/25/2012    Procedure: ARTHROSCOPY KNEE;  Surgeon: Darreld Mclean, MD;  Location: [facility];  Service: Orthopedics;  Laterality: Right;    Subjective Symptoms/Limitations Symptoms: PMH: see hx section. Significant for continous headaches Pertinent History: Pt is referred to PT s/p R knee scope to removal medial meniscus.  He reoprts that back in January his knee started popping of insidious nature.  He then recieved a medial menisectomy on 8/13.  His c/co pain, favoring RLE (increasing L LE pain), difficulty with ADL's (getting into and out of the bathtub) How long can you stand comfortably?: No problems getting ready, able to stand longer as long as he is propping.  How long can you walk comfortably?: Has not attempted to walk around town yet.  Able to complete household ambulating.  Patient Stated Goals: "Pt wishes would like to walk further with less pain" Pain Assessment Currently in Pain?: Yes Pain Score:   6 Pain Location: Knee Pain Orientation: Right Pain Type: Acute pain;Surgical  pain Effect of Pain on Daily Activities: Pt has difficulty getting into and out of the tub and going up and down stairs.   Prior Functioning  Prior Function Vocation: On disability Vocation Requirements: for increased headaches Comments: He reports that he does not have   Cognition/Observation Observation/Other Assessments Observations: Hemosiderin staining to BLE w/increased pitting edema, significant impaired flexibility to hip flexors, gastroc, solues, hamstrings and quadriceps Other Assessments: Favors RLE when standing and with transitional movements  Sensation/Coordination/Flexibility/Functional Tests Functional Tests Functional Tests: LEFS: 24/80  Assessment RLE AROM (degrees) Right Knee Extension: 6  Right Knee Flexion: 82  RLE PROM (degrees) Right Knee Extension: 3  Right Knee Flexion: 93  RLE Strength RLE Overall Strength Comments: R SLR: 3 inches from ground secondary to decreased strength Right Hip Flexion: 5/5 Right Hip Extension: 3+/5 (taken in S/L position) Right Hip ABduction: 3+/5 Right Knee Flexion: 3+/5 Right Knee Extension: 4/5 Palpation Palpation: increased pain and tenderness to R popliteal region   Mobility/Balance  Ambulation/Gait Ambulation/Gait: Yes Ambulation/Gait Assistance: 7: Independent Gait Pattern: Right foot flat;Decreased hip/knee flexion - right;Decreased weight shift to right Stairs: Yes Stairs Assistance: 6: Modified independent (Device/Increase time) Stair Management Technique: One rail Right;Step to pattern Number of Stairs: 2  Height of Stairs: 6  Posture/Postural Control Posture/Postural Control: Postural limitations Postural Limitations: significant upper and lower cross syndrome Static Standing Balance Static Standing - Comment/# of Minutes: each position held for a maximum of 10 seconds Single Leg Stance - Right Leg: 5  Single Leg Stance -  Left Leg: 3  Tandem Stance - Right Leg: 5  Tandem Stance - Left Leg: 8     Exercise/Treatments Stretches Gastroc Stretch: 30 seconds (standing) Seated Long Arc Quad: Right;5 reps Supine Quad Sets: Right;5 reps (5 sec holds) Short Arc [employer]: Right;5 reps (5 sec holds ) Heel Slides: 10 reps  Physical Therapy Assessment and Plan PT Assessment and Plan Clinical Impression Statement: Pt is a 46 year old male referred to PT s/p R knee scope w/medial menisectomy on 02/25/12.   Pt will benefit from skilled therapeutic intervention in order to improve on the following deficits: Abnormal gait;Pain;Decreased range of motion;Decreased strength;Impaired flexibility;Impaired perceived functional ability;Decreased balance Rehab Potential: Fair Clinical Impairments Affecting Rehab Potential: secondary to current medical conditions.  PT Frequency: Min 2X/week PT Duration: 4 weeks PT Treatment/Interventions: Gait training;Stair training;Functional mobility training;Therapeutic activities;Therapeutic exercise;Balance training;Neuromuscular re-education;Patient/family education;Manual techniques;Modalities PT Plan: Continue with LE streghtening.  Squats, heel/toe raises, standing knee flexion, gastroc/hamstring stretch on slant board, standing hip abd and extension, continue with mat exercises.  Use manual techniques to increase ROM, do not use bike for intial visits.     Goals Home Exercise Program Pt will Perform Home Exercise Program: Independently PT Goal: Perform Home Exercise Program - Progress: Goal set today PT Short Term Goals Time to Complete Short Term Goals: 4 weeks PT Short Term Goal 1: Pt will report pain less than 3/10 to his knee and popliteal region for improved QOL/ PT Short Term Goal 2: Pt will improve knee AROM 0-120 degrees in order to comfortably enter his shower with greater ease.  PT Short Term Goal 3: Pt will improve his LE strength to WNL in order to ascend and descend stairs with 1 handrail with reciprocal pattern in order to safely enter  community areas.  PT Short Term Goal 4: Pt will improve his static balance and demonstrate 10 sec on R and L LE.   PT Short Term Goal 5: Pt will improve his LEFS to 40/80 for improved QOL.  Problem List Patient Active Problem List  Diagnosis  . Knee pain  . Knee stiffness    PT Plan of Care PT Home Exercise Plan: see scanned recport  PT Patient Instructions: importance of HEP  Consulted and Agree with Plan of Care: Patient  GP Functional Assessment Tool Used: LEFS: (24/80), MMT, ROM, self-report Functional Limitation: Mobility: Walking and moving around Mobility: Walking and Moving Around Current Status ([id number]): At least 60 percent but less than 80 percent impaired, limited or restricted Mobility: Walking and Moving Around Goal Status [phone]): At least 20 percent but less than 40 percent impaired, limited or restricted  Joselynn Amoroso 03/02/2012, 12:15 PM  Physician Documentation Your signature is required to indicate approval of the treatment plan as stated above.  Please sign and either send electronically or make a copy of this report for your files and return this physician signed original.   Please mark one 1.__approve of plan  2. ___approve of plan with the following conditions.   ______________________________                                                          _____________________ Physician Signature  Date  

## 2012-03-04 ENCOUNTER — Ambulatory Visit (HOSPITAL_COMMUNITY)
Admission: RE | Admit: 2012-03-04 | Discharge: 2012-03-04 | Disposition: A | Discharge status: Home / Self Care | Payer: Medicare Other | Source: Ambulatory Visit | Attending: Physical Therapy | Admitting: Physical Therapy

## 2012-03-04 DIAGNOSIS — R269 Unspecified abnormalities of gait and mobility: Secondary | ICD-10-CM | POA: Diagnosis not present

## 2012-03-04 DIAGNOSIS — M25669 Stiffness of unspecified knee, not elsewhere classified: Secondary | ICD-10-CM | POA: Diagnosis not present

## 2012-03-04 DIAGNOSIS — M25569 Pain in unspecified knee: Secondary | ICD-10-CM | POA: Diagnosis not present

## 2012-03-04 DIAGNOSIS — M6281 Muscle weakness (generalized): Secondary | ICD-10-CM | POA: Diagnosis not present

## 2012-03-04 DIAGNOSIS — IMO0001 Reserved for inherently not codable concepts without codable children: Secondary | ICD-10-CM | POA: Diagnosis not present

## 2012-03-04 NOTE — Progress Notes (Signed)
Physical Therapy Treatment Patient Details  Name: Shaun Harris MRN: [id number] Date of Birth: 1966-02-18  Today's Date: 03/04/2012 Time: 4098-1191 PT Time Calculation (min): 46 min Charges: 1 estim, 10' manual, 20' TE Visit#: 2  of 8   Re-eval: 04/01/12 Assessment Diagnosis: L knee scope  Surgical Date: 02/25/12 Next MD Visit: Dr. Hilda Lias - 03/06/12  Authorization: MEDICARE  Authorization Time Period: MOBILITY: G CODE: CURRENT: CL, GOAL: CJ  Authorization Visit#: 2  of 10    Subjective: Symptoms/Limitations Symptoms: Pt reports his knee is really hurting a lot and popping a lot.   Pain Assessment Currently in Pain?: Yes Pain Score:   7 Pain Location: Knee Pain Orientation: Right Pain Type: Acute pain  Precautions/Restrictions  Precautions Precaution Comments: No Bike - per PT POC  Exercise/Treatments Stretches Gastroc Stretch: 3 reps;30 seconds (slant board) Standing Heel Raises: 10 reps;Limitations Heel Raises Limitations: Toe Raises: 10 reps Functional Squat: 10 reps Seated Long Arc Quad: Right;10 reps (5 sec hold) Heel Slides: Right;10 reps (5 sec hold) Supine Quad Sets: Right;10 reps (5 sec) Short Arc Quad Sets: Right;15 reps;Other (comment) (5 sec) Heel Slides: Right;15 reps Bridges: 10 reps;Both Straight Leg Raises: Right;15 reps Sidelying Hip ABduction: Right;15 reps Prone  Hamstring Curl: 10 reps Hip Extension: Right;10 reps  Modalities Modalities: Electrical Stimulation;Cryotherapy Manual Therapy Manual Therapy: Other (comment) Joint Mobilization: Grade I-II to patella in all directions, proximal tib and fib to improve knee flexion.  w/PROM to improve flexion after.  Other Manual Therapy: STM to lateral joint line to decrease pain.  Cryotherapy Number Minutes Cryotherapy: 15 Minutes Cryotherapy Location: Knee Type of Cryotherapy: Ice pack Pharmacologist Location: R lateral knee Electrical Stimulation Action:  IFES started to feel at 20 volts  x15 w/ice Electrical Stimulation Parameters: Volts up to 40 Electrical Stimulation Goals: Pain  Physical Therapy Assessment and Plan PT Assessment and Plan Clinical Impression Statement: Pt was greatly limited in his activities today secondary to pain and tenderness to medial joint line.  Pt able to complete AROM to the same degrees as LLE.   Pt will benefit from skilled therapeutic intervention in order to improve on the following deficits: Abnormal gait;Pain;Decreased range of motion;Decreased strength;Impaired flexibility;Impaired perceived functional ability;Decreased balance Rehab Potential: Fair Clinical Impairments Affecting Rehab Potential: secondary to current medical conditions.  PT Frequency: Min 2X/week PT Duration: 4 weeks PT Treatment/Interventions: Gait training;Stair training;Functional mobility training;Therapeutic activities;Therapeutic exercise;Balance training;Neuromuscular re-education;Patient/family education;Manual techniques;Modalities PT Plan: Add: standing hip abd and extension, continue with mat exercises. Use manual techniques to increase ROM, do not use bike for intial visits    Goals    Problem List Patient Active Problem List  Diagnosis  . Knee pain  . Knee stiffness   Dequann Vandervelden, PT 03/04/2012, 11:44 AM

## 2012-03-09 ENCOUNTER — Ambulatory Visit (HOSPITAL_COMMUNITY)
Admission: RE | Admit: 2012-03-09 | Discharge: 2012-03-09 | Disposition: A | Discharge status: Home / Self Care | Payer: Medicare Other | Source: Ambulatory Visit | Attending: Physical Therapy | Admitting: Physical Therapy

## 2012-03-09 DIAGNOSIS — M25569 Pain in unspecified knee: Secondary | ICD-10-CM | POA: Diagnosis not present

## 2012-03-09 DIAGNOSIS — IMO0001 Reserved for inherently not codable concepts without codable children: Secondary | ICD-10-CM | POA: Diagnosis not present

## 2012-03-09 DIAGNOSIS — M25669 Stiffness of unspecified knee, not elsewhere classified: Secondary | ICD-10-CM | POA: Diagnosis not present

## 2012-03-09 DIAGNOSIS — R269 Unspecified abnormalities of gait and mobility: Secondary | ICD-10-CM | POA: Diagnosis not present

## 2012-03-09 DIAGNOSIS — M6281 Muscle weakness (generalized): Secondary | ICD-10-CM | POA: Diagnosis not present

## 2012-03-09 NOTE — Progress Notes (Signed)
Physical Therapy Treatment Patient Details  Name: EMANI MORAD MRN: [id number] Date of Birth: [birth year]  Today's Date: 03/09/2012 Time: 1100-1155 PT Time Calculation (min): 55 min  Visit#: 3  of 8   Re-eval: 04/01/12 Charges: Therex x 35' IFES w/ice x 15'  Authorization: MEDICARE  Authorization Time Period: MOBILITY: G CODE: CURRENT: CL, GOAL: CJ  Authorization Visit#: 3  of 10    Subjective: Symptoms/Limitations Symptoms: I am still really sore and popping on my leg.   Pain Assessment Currently in Pain?: Yes Pain Score:   5 Pain Location: Knee Pain Orientation: Right   Exercise/Treatments Stretches Passive Hamstring Stretch: 3 reps;30 seconds Quad Stretch: 3 reps;30 seconds Gastroc Stretch: 3 reps;30 seconds Standing Heel Raises: 15 reps Heel Raises Limitations: Toe Raises: 15 reps Knee Flexion: 10 reps;Limitations Knee Flexion Limitations: 2# Supine Short Arc Quad Sets: Right;20 reps;Other (comment) (5 sec holds) Straight Leg Raises: Right;20 reps Sidelying Hip ABduction: Right;20 reps Prone  Hamstring Curl: 20 reps;Limitations Hamstring Curl Limitations: 2# Hip Extension: Right;20 reps   Modalities Modalities: Electrical Stimulation;Cryotherapy Manual Therapy Manual Therapy: Other (comment) Cryotherapy Number Minutes Cryotherapy: 15 Minutes Cryotherapy Location: Knee Type of Cryotherapy: Ice pack Pharmacologist Location: R lateral knee Electrical Stimulation Action: IFES started to feel at 20 volts x15 w/ice  Electrical Stimulation Parameters: Volts up to 34 Electrical Stimulation Goals: Pain  Physical Therapy Assessment and Plan PT Assessment and Plan Clinical Impression Statement: Pt completes therex well. Attempted small functional squats but this increased pt's pain so they were held this session. Ice and estim applied to R knee to limit pain and swelling. Pt will benefit from skilled therapeutic intervention in  order to improve on the following deficits: Abnormal gait;Pain;Decreased range of motion;Decreased strength;Impaired flexibility;Impaired perceived functional ability;Decreased balance Rehab Potential: Fair Clinical Impairments Affecting Rehab Potential: secondary to current medical conditions.  PT Frequency: Min 2X/week PT Duration: 4 weeks PT Treatment/Interventions: Gait training;Stair training;Functional mobility training;Therapeutic activities;Therapeutic exercise;Balance training;Neuromuscular re-education;Patient/family education;Manual techniques;Modalities PT Plan: Continue to progress open chain exercises per PT POC. Continue to hold bike secondary to pain/popping.     Problem List Patient Active Problem List  Diagnosis  . Knee pain  . Knee stiffness    PT - End of Session Activity Tolerance: Patient tolerated treatment well General Behavior During Session: [facility] for tasks performed Cognition: [facility] for tasks performed   Seth Bake, PTA 03/09/2012, 12:00 PM

## 2012-03-11 ENCOUNTER — Ambulatory Visit (HOSPITAL_COMMUNITY): Payer: Medicare Other | Admitting: Physical Therapy

## 2012-03-17 ENCOUNTER — Ambulatory Visit (HOSPITAL_COMMUNITY)
Admission: RE | Admit: 2012-03-17 | Discharge: 2012-03-17 | Disposition: A | Discharge status: Home / Self Care | Payer: Medicare Other | Source: Ambulatory Visit | Attending: Family Medicine | Admitting: Family Medicine

## 2012-03-17 DIAGNOSIS — M6281 Muscle weakness (generalized): Secondary | ICD-10-CM | POA: Insufficient documentation

## 2012-03-17 DIAGNOSIS — M25569 Pain in unspecified knee: Secondary | ICD-10-CM | POA: Insufficient documentation

## 2012-03-17 DIAGNOSIS — M25669 Stiffness of unspecified knee, not elsewhere classified: Secondary | ICD-10-CM | POA: Insufficient documentation

## 2012-03-17 DIAGNOSIS — R269 Unspecified abnormalities of gait and mobility: Secondary | ICD-10-CM | POA: Diagnosis not present

## 2012-03-17 DIAGNOSIS — IMO0001 Reserved for inherently not codable concepts without codable children: Secondary | ICD-10-CM | POA: Insufficient documentation

## 2012-03-17 NOTE — Progress Notes (Addendum)
Physical Therapy Treatment Patient Details  Name: ZAAHIR PICKNEY MRN: [id number] Date of Birth: Oct 26, 1965  Today's Date: 03/17/2012 Time: 1102-1205 PT Time Calculation (min): 63 min  Visit#: 4  of 8   Re-eval: 04/01/12 Assessment Diagnosis: L knee scope  Surgical Date: 02/25/12 Next MD Visit: Dr. Hilda Lias - 03/20/2012 Charge: therex 50 min IFES/Ice 10 min/ 1 unit  Authorization: MEDICARE  Authorization Time Period: MOBILITY: G CODE: CURRENT: CL, GOAL: CJ  Authorization Visit#: 4  of 10    Subjective: Symptoms/Limitations Symptoms: My leg sore today, pain scale 4-5/10 today. Pain Assessment Currently in Pain?: Yes Pain Score:   5 Pain Location: Knee Pain Orientation: Right  Objective:   Exercise/Treatments Stretches Passive Hamstring Stretch: 3 reps;30 seconds Quad Stretch: 3 reps;30 seconds Gastroc Stretch: 3 reps;30 seconds Standing Heel Raises: 20 reps Heel Raises Limitations: Toe Raises: 20 reps Knee Flexion: 15 reps;Limitations Knee Flexion Limitations: 2# Functional Squat: 15 reps Seated Long Arc Quad: Right;20 reps;Weights Long Arc Quad Weight: 2 lbs. Supine Quad Sets: Right;10 reps;Limitations Quad Sets Limitations: 10 sec holds Short Arc [employer]: Right;20 reps;Other (comment) (5 Sec holds) Heel Slides: Right;10 reps;Limitations Heel Slides Limitations: 10 sec holds Bridges: 15 reps Straight Leg Raises: Right;20 reps;Limitations Straight Leg Raises Limitations: 2# Patellar Mobs: Done  Knee Extension: PROM;2 sets Knee Flexion: PROM;2 sets Sidelying Hip ABduction: 20 reps;Limitations Hip ABduction Limitations: 2# Prone  Hamstring Curl: 20 reps;Limitations Hamstring Curl Limitations: 3 Hip Extension: 20 reps;Limitations Hip Extension Limitations: 2  IFES/Ice with legs elevated    Physical Therapy Assessment and Plan PT Assessment and Plan Clinical Impression Statement: Pt tolerated treatment well with good control noted with therex, able to  add/increase weight with mat activities.  Added PROM to POC today with pain increased.  Pain reduced at end of session following IFES and ice. PT Plan: Reassess next session prior MD session on 03/20/2012.  Continue with therex for LE strengthening and to increase ROM.  Continue holding bike due to increased pain and knee popping.    Goals    Problem List Patient Active Problem List  Diagnosis  . Knee pain  . Knee stiffness    PT - End of Session Activity Tolerance: Patient tolerated treatment well General Behavior During Session: [facility] for tasks performed Cognition: [facility] for tasks performed  GP    Juel Burrow 03/17/2012, 12:12 PM

## 2012-03-19 ENCOUNTER — Ambulatory Visit (HOSPITAL_COMMUNITY)
Admission: RE | Admit: 2012-03-19 | Discharge: 2012-03-19 | Disposition: A | Discharge status: Home / Self Care | Payer: Medicare Other | Source: Ambulatory Visit | Attending: Physical Therapy | Admitting: Physical Therapy

## 2012-03-19 NOTE — Progress Notes (Signed)
Physical Therapy Re-evaluation  Patient Details  Name: Shaun Harris MRN: [id number] Date of Birth: 1965/11/24  Today's Date: 03/19/2012 Time: 5621-3086 PT Time Calculation (min): 65 min  Visit#: 5  of 8   Re-eval: 04/01/12 Assessment Diagnosis: L knee scope  Surgical Date: 02/25/12 Next MD Visit: Dr. Hilda Lias - 03/20/2012 Charge: MMT 1 unit ROM Measurement 1 unit therex 45 [place] 8  Authorization: MEDICARE  Authorization Time Period: MOBILITY: G CODE: CURRENT: CK, GOAL: CJ  Authorization Visit#: 5  (Gcode complete on visit 5) of 15    Subjective Symptoms/Limitations Symptoms: My leg sore today, pain scale 4-5/10 today.  Pt stated knee popping has increased since surgery. How long can you sit comfortably?: able to sit for 2 hours with knee movements How long can you stand comfortably?: Able to stand unlimited length of time but pt favors L leg, stands with more weight on R leg How long can you walk comfortably?: Able to walk for 30-45 minutes Pain Assessment Currently in Pain?: Yes Pain Score:   5 Pain Location: Knee Pain Orientation: Right  Objective:   Sensation/Coordination/Flexibility/Functional Tests Functional Tests Functional Tests: LEFS: 44/80 (was 24/80)  Assessment RLE AROM (degrees) Right Knee Extension: 1  (was 2) Right Knee Flexion: 105  (was 82 initial eval, 103 on 03/17/2012) RLE PROM (degrees) Right Knee Extension: 0  Right Knee Flexion: 107  RLE Strength Right Hip Extension: 4/5 (was 3+/5) Right Hip ABduction: 5/5 (was 3+/5) Right Knee Flexion: 4/5 (was 3+/5) Right Knee Extension:  (4+/5was 4/5)  Exercise/Treatments Stretches Active Hamstring Stretch: 2 reps;60 seconds Passive Hamstring Stretch: 3 reps;30 seconds Quad Stretch: 3 reps;30 seconds Gastroc Stretch: 3 reps;30 seconds Standing Heel Raises: 20 reps Heel Raises Limitations: Toe Raises: 20 reps Knee Flexion: 15 reps;Limitations Knee Flexion Limitations: 2# Functional Squat:  Limitations Functional Squat Limitations: held due to c/o knee popping Stairs: stair training reciprocal ascending, step to pattern desceding noted knee popping when trying reciprocal descending  Supine Quad Sets: Right;10 reps;Limitations Heel Slides: Right;10 reps;Limitations Heel Slides Limitations: 10 sec holds Patellar Mobs: Done  Knee Extension: PROM;1 set Knee Flexion: PROM;1 set  Physical Therapy Assessment and Plan PT Assessment and Plan Clinical Impression Statement: Reassessment complete prior MD apt tomorrow. [name] has had 5 OPPT sessions with the following findings: Pt has met 1/5 and progressing towards other goals. Pt stated pain average 4-5/10 with compliants of painful knee popping with closed chain activities (squats, stairs. AROM has increased to 1-105 degrees. LE strength has increased, pt is able to demonstrate reciprocal pattern ascending stairs, step to pattern descending stairs with c/o knee popping. Pt with improved static balance, able to SLS R 5", L 11". Pt with increased perceived LE functional abilites by 20 points since initial eval. Changed modalities to [place], pt was explained benefits of modality. Pt stated increased overall soreness at end of session. PT Plan: Recommend continuing PT to increase ROM, improve functional strengthening and balance and reduce pain.  Assess pain relief following [place], if positive begin continous to promote circulation to area.  Continue holding bike due to knee popping/pain.    Goals    Problem List Patient Active Problem List  Diagnosis  . Knee pain  . Knee stiffness    PT - End of Session Activity Tolerance: Patient tolerated treatment well General Behavior During Session: [facility] for tasks performed Cognition: [facility] for tasks performed  GP Functional Assessment Tool Used: LEFS, MMT, ROM, self-report Functional Limitation: Mobility: Walking and moving around Mobility: Walking  and Moving Around Current Status [phone]): At least  40 percent but less than 60 percent impaired, limited or restricted Mobility: Walking and Moving Around Goal Status [phone]): At least 20 percent but less than 40 percent impaired, limited or restricted  Juel Burrow 03/19/2012, 5:42 PM

## 2012-03-20 DIAGNOSIS — IMO0002 Reserved for concepts with insufficient information to code with codable children: Secondary | ICD-10-CM | POA: Diagnosis not present

## 2012-03-20 DIAGNOSIS — M25569 Pain in unspecified knee: Secondary | ICD-10-CM | POA: Diagnosis not present

## 2012-03-23 ENCOUNTER — Ambulatory Visit (HOSPITAL_COMMUNITY)
Admission: RE | Admit: 2012-03-23 | Discharge: 2012-03-23 | Disposition: A | Discharge status: Home / Self Care | Payer: Medicare Other | Source: Ambulatory Visit | Attending: Family Medicine | Admitting: Family Medicine

## 2012-03-23 NOTE — Progress Notes (Signed)
Physical Therapy Treatment Patient Details  Name: Shaun Harris MRN: [id number] Date of Birth: 05/06/1966  Today's Date: 03/23/2012 Time: 1478-2956 PT Time Calculation (min): 42 min  Visit#: 6  of 16   Re-eval: 04/01/12 Charges: Therex x 30' [place] x8'  Authorization: MEDICARE  Authorization Time Period: MOBILITY: G CODE: CURRENT: CK, GOAL: CJ  Authorization Visit#: 6  (Gcode complete on visit 5) of 15    Subjective: Symptoms/Limitations Symptoms: Pt states that he went to MD Friday and he removed fluid from his knee. Pain Assessment Currently in Pain?: Yes Pain Score:   5 Pain Location: Knee Pain Orientation: Right   Exercise/Treatments Standing Heel Raises: 20 reps Heel Raises Limitations: Toe Raises: 20 reps Knee Flexion: 15 reps;Limitations Knee Flexion Limitations: 3# Supine Quad Sets: Right;10 reps;Limitations Quad Sets Limitations: 10 sec holds Heel Slides: Right;10 reps;Limitations Heel Slides Limitations: 10 sec holds   Modalities Modalities: Ultrasound Manual Therapy Other Manual Therapy: STM to R medial knee to decrease fascial restrictions Ultrasound Ultrasound Location: R quad insertion; medial knee Ultrasound Parameters: .8 w/cm2 x 8' continuous Ultrasound Goals: Pain  Physical Therapy Assessment and Plan PT Assessment and Plan Clinical Impression Statement: Pt continues to have popping and pain with wt bearing exercises. Continued [place] this session to improved blood flow and facilitate heeling in R knee area. Manual techniques completed to R medial knee to decrease fascial restrictions. Pt reports no change in pain at end of session. PT Plan: Continue PT x 4 wks per MD.     Problem List Patient Active Problem List  Diagnosis  . Knee pain  . Knee stiffness    PT - End of Session Activity Tolerance: Patient tolerated treatment well General Behavior During Session: [facility] for tasks performed Cognition: [facility] for tasks performed  GP Functional  Assessment Tool Used: LEFS, MMT, ROM, self-report  Seth Bake, PTA 03/23/2012, 12:17 PM

## 2012-03-25 ENCOUNTER — Ambulatory Visit (HOSPITAL_COMMUNITY)
Admission: RE | Admit: 2012-03-25 | Discharge: 2012-03-25 | Disposition: A | Discharge status: Home / Self Care | Payer: Medicare Other | Source: Ambulatory Visit | Attending: Family Medicine | Admitting: Family Medicine

## 2012-03-25 NOTE — Progress Notes (Signed)
Physical Therapy Treatment Patient Details  Name: Shaun Harris MRN: [id number] Date of Birth: August 30, 1965  Today's Date: 03/25/2012 Time: 1103-1200 PT Time Calculation (min): 57 min Charges: 1 e-stim; Manual x43 Manual  Visit#: 7  of 16   Re-eval: 04/24/12   Authorization: MEDICARE  Authorization Time Period: MOBILITY: G CODE: CURRENT: CK, GOAL: CJ.  G-code on 5th visit  Authorization Visit#: 7  of 15    Subjective: Symptoms/Limitations Symptoms: Pt reports that he continues to be limited by his swelling and popping in his knee.    Exercise/Treatments Modalities Modalities: Electrical Stimulation;Moist Heat Manual Therapy Manual Therapy: Myofascial release Joint Mobilization: PRONE: knee flexion with grade I-III joint mobs w/PROM after to increase knee flexion and improve quadricep length. Myofascial Release: SUPINE: to anterior, anterio-later, anterio-medial of R distal quadriceps and ITB and patella to decrease fascial restrcitons and improve ROM w/STM after Moist Heat Therapy Number Minutes Moist Heat: 15 Minutes (w/estim at end of tx) Moist Heat Location: Other (comment) (knee) Electrical Stimulation Electrical Stimulation Location: R lateral knee  Electrical Stimulation Action: IFES started to feel at 20 volts x15 w/heat Electrical Stimulation Goals: Pain  Physical Therapy Assessment and Plan PT Assessment and Plan Clinical Impression Statement: Treatment consisted of decreasing fascial restrcitons and pain with use of manual techniques and mobility.  Pt able to demnstrate improved knee flexion without knee popping after manual techniques.  Encouraged pt to continue with gentle ROM thorughout the day to encourage muscle length.  PT Plan: F/u on popping frequency.     Goals    Problem List Patient Active Problem List  Diagnosis  . Knee pain  . Knee stiffness    PT Plan of Care PT Patient Instructions: Encouraged to drink extra water, increase knee AROM exercises  with all activities to encourage muscle length.  Consulted and Agree with Plan of Care: Patient  Annett Fabian, PT 03/25/2012, 11:53 AM

## 2012-03-31 ENCOUNTER — Ambulatory Visit (HOSPITAL_COMMUNITY)
Admission: RE | Admit: 2012-03-31 | Discharge: 2012-03-31 | Disposition: A | Discharge status: Home / Self Care | Payer: Medicare Other | Source: Ambulatory Visit | Attending: Family Medicine | Admitting: Family Medicine

## 2012-03-31 NOTE — Progress Notes (Signed)
Physical Therapy Treatment Patient Details  Name: Shaun Harris MRN: [id number] Date of Birth: 1965/07/16  Today's Date: 03/31/2012 Time: 7829-5621 PT Time Calculation (min): 53 min  Visit#: 8  of 16   Re-eval: 04/24/12 Assessment Diagnosis: L knee scope  Surgical Date: 02/25/12 Next MD Visit: Dr. Hilda Lias - 04/03/2012 Charge: Manual x 23, therex 15, IFES/MHP x 15   Authorization: Medicare  Authorization Time Period: MOBILITY: G CODE: CURRENT: CK, GOAL: CJ. G-code on 5th visit   Authorization Visit#: 8  of 15    Subjective: Symptoms/Limitations Symptoms: Pt reports knee felt the best it has in a long time following last session, still c/o popping and increased pain 4-5/10 Pain Assessment Currently in Pain?: Yes Pain Score:   5 Pain Location: Knee Pain Orientation: Right  Objective:   Exercise/Treatments Stretches Passive Hamstring Stretch: 3 reps;30 seconds Quad Stretch: 3 reps;30 seconds Supine Heel Slides: Right;10 reps;Limitations Heel Slides Limitations: 10 sec holds Patellar Mobs: Done  Knee Extension: PROM;1 set Knee Flexion: PROM;1 set  Manual Therapy Joint Mobilization: PRONE: knee flexion with grade I-III joint mobs w/PROM after to increase knee flexion and improve quadricep length. Myofascial Release: SUPINE: to anterior, anterio-later, anterio-medial of R distal quadriceps and ITB and patella to decrease fascial restrcitons and improve ROM w/STM after  Moist Heat Therapy Number Minutes Moist Heat: 15 Minutes Moist Heat Location:  (knee) Electrical Stimulation Electrical Stimulation Location: R lateral knee Electrical Stimulation Action: IFES/MHP hi/lo sweep 38 volts to reduce pain Electrical Stimulation Parameters: IFES hi/lo sweep 80/150 38 v Electrical Stimulation Goals: Pain  Physical Therapy Assessment and Plan PT Assessment and Plan Clinical Impression Statement: Continued manual techniques to decrease fascial restrictions, improve patella mobility  and increase ROM.  Pt able to demonstrate improved knee flexion following manual techniques but continues to c/o knee popping,   PT Plan: Reassess next session prior MD apt.    Goals    Problem List Patient Active Problem List  Diagnosis  . Knee pain  . Knee stiffness    PT - End of Session Activity Tolerance: Patient tolerated treatment well General Behavior During Session: [facility] for tasks performed Cognition: [facility] for tasks performed  GP    Juel Burrow 03/31/2012, 1:24 PM

## 2012-04-02 ENCOUNTER — Ambulatory Visit (HOSPITAL_COMMUNITY)
Admission: RE | Admit: 2012-04-02 | Discharge: 2012-04-02 | Disposition: A | Discharge status: Home / Self Care | Payer: Medicare Other | Source: Ambulatory Visit | Attending: Family Medicine | Admitting: Family Medicine

## 2012-04-02 DIAGNOSIS — I10 Essential (primary) hypertension: Secondary | ICD-10-CM | POA: Diagnosis not present

## 2012-04-02 DIAGNOSIS — R51 Headache: Secondary | ICD-10-CM | POA: Diagnosis not present

## 2012-04-02 DIAGNOSIS — E119 Type 2 diabetes mellitus without complications: Secondary | ICD-10-CM | POA: Diagnosis not present

## 2012-04-02 DIAGNOSIS — E785 Hyperlipidemia, unspecified: Secondary | ICD-10-CM | POA: Diagnosis not present

## 2012-04-02 DIAGNOSIS — Z23 Encounter for immunization: Secondary | ICD-10-CM | POA: Diagnosis not present

## 2012-04-02 NOTE — Evaluation (Signed)
Physical Therapy Re-Evaluation/Discharge/G-code  Patient Details  Name: Shaun Harris MRN: [id number] Date of Birth: 07-09-1966  Today's Date: 04/02/2012 Time: 1000-1053 PT Time Calculation (min): 53 min Charges: 1 ROM, 1 MMT, 15' TE, 10' Self Care, 1 estim Visit#: 9  of 16   Re-eval: 04/24/12 Assessment Diagnosis: L knee scope  Surgical Date: 02/25/12 Next MD Visit: Dr. Hilda Lias - 04/02/2012  Authorization: Medicare  Authorization Time Period: MOBILITY: G CODE: CURRENT: CK, GOAL: CJ. G-code on 5th visit   Authorization Visit#: 9  of 15    Subjective Symptoms/Limitations Symptoms: Pt reports that he is seeing Dr. Hilda Lias this afternoon.  He reports he has continued pain with activities which does not seem to be improving with out modalities and has increased popping which is frusturating.  Reports pain 4-5/10. Pain Assessment Currently in Pain?: Yes Pain Score:   5 Pain Location: Knee Pain Orientation: Right  Assessment RLE AROM (degrees) Right Knee Extension: 0  (was 1) Right Knee Flexion: 112  (was 105) RLE Strength Right Hip Flexion: 5/5 Right Hip Extension:  (4+/5, was 4/5) Right Hip ABduction: 5/5 Right Knee Flexion: 5/5 (was 4/5) Right Knee Extension: 5/5 (was 4+/5)  Mobility/Balance  Static Standing Balance Single Leg Stance - Right Leg: 13  (was 5)   Exercise/Treatments Aerobic Stationary Bike: 6' @ 2.0 for AROM Standing Heel Raises: 20 reps Heel Raises Limitations: Toe Raises: 20 reps Knee Flexion: 20 reps Functional Squat: 20 reps (mini squats) Stairs: 1 RT, reciprocal to ascend, step to to descend w/1 handrail SLS: R SLS max 13 seconds Supine Straight Leg Raises: Right;20 reps Sidelying Hip ABduction: 20 reps  Modalities Modalities: Electrical Stimulation;Cryotherapy Moist Heat Therapy Number Minutes Moist Heat: 15 Minutes Moist Heat Location: Other (comment) (knee) Cryotherapy Number Minutes Cryotherapy: 15 Minutes Cryotherapy Location:  Knee Type of Cryotherapy: Ice pack Pharmacologist Location: R anterior knee  Electrical Stimulation Action: IFES/MHP hi/lo sweep 38 volts to reduce pain  Electrical Stimulation Parameters: IFES hi/lo sweep 80/150 39 v Electrical Stimulation Goals: Pain  Physical Therapy Assessment and Plan PT Assessment and Plan Clinical Impression Statement: Mr. Alms has attended 9 OP PT visits s/p R knee scope with the following findings: he continues to have greatest limitations due to pain and popping to his knee which decreases his overall function.  His strength and ROM have improved, however is limited due to pain at end range of motion. Will D/C w/HEP secndary to continued lack of gains with therapy.  PT Plan: D/C w/HEP    Goals Pt will Perform Home Exercise Program: Independently - Met PT Short Term Goals: 4 weeks PT Short Term Goal 1: Pt will report pain less than 3/10 to his knee and popliteal region for improved QOL/: Not met (pain on avg 4-5/10) PT Short Term Goal 2: Pt will improve knee AROM 0-120 degrees in order to comfortably enter his shower with greater ease.  (0-112): Partly met PT Short Term Goal 3: Pt will improve his LE strength to WNL in order to ascend and descend stairs with 1 handrail with reciprocal pattern in order to safely enter community areas.: Partly met (ascend w/reciprocal, descend step to due to pain) PT Short Term Goal 4: Pt will improve his static balance and demonstrate 10 sec on R and L LE. : Met (13 seconds) PT Short Term Goal 5: Pt will improve his LEFS to 40/80 for improved QOL.: Met (41/80)  Problem List Patient Active Problem List  Diagnosis  . Knee  pain  . Knee stiffness   PT - End of Session Activity Tolerance: Patient tolerated treatment well General Behavior During Session: [facility] for tasks performed Cognition: [facility] for tasks performed PT Plan of Care PT Patient Instructions: Educated to continue with HEP exercises to  continue with functional ROM and strength.  Discussed w/pt LEFS Consulted and Agree with Plan of Care: Patient  GP Functional Assessment Tool Used: LEFS, MMT, ROM, self-report  Functional Limitation: Mobility: Walking and moving around Mobility: Walking and Moving Around Current Status [phone]): At least 20 percent but less than 40 percent impaired, limited or restricted Mobility: Walking and Moving Around Goal Status [phone]): At least 20 percent but less than 40 percent impaired, limited or restricted Mobility: Walking and Moving Around Discharge Status [phone]): At least 20 percent but less than 40 percent impaired, limited or restricted  Saori Umholtz, PT 04/02/2012, 10:47 AM  Physician Documentation Your signature is required to indicate approval of the treatment plan as stated above.  Please sign and either send electronically or make a copy of this report for your files and return this physician signed original.   Please mark one 1.__approve of plan  2. ___approve of plan with the following conditions.   ______________________________                                                          _____________________ Physician Signature                                                                                                             Date

## 2012-04-07 ENCOUNTER — Ambulatory Visit (HOSPITAL_COMMUNITY): Payer: Medicare Other

## 2012-04-09 ENCOUNTER — Ambulatory Visit (HOSPITAL_COMMUNITY): Payer: Medicare Other | Admitting: Physical Therapy

## 2012-05-01 DIAGNOSIS — M25569 Pain in unspecified knee: Secondary | ICD-10-CM | POA: Diagnosis not present

## 2012-05-07 DIAGNOSIS — G8929 Other chronic pain: Secondary | ICD-10-CM | POA: Diagnosis not present

## 2012-05-21 DIAGNOSIS — M25569 Pain in unspecified knee: Secondary | ICD-10-CM | POA: Diagnosis not present

## 2012-06-18 ENCOUNTER — Other Ambulatory Visit (HOSPITAL_COMMUNITY): Payer: Self-pay | Admitting: Orthopaedic Surgery

## 2012-06-18 DIAGNOSIS — M25561 Pain in right knee: Secondary | ICD-10-CM

## 2012-06-18 DIAGNOSIS — IMO0002 Reserved for concepts with insufficient information to code with codable children: Secondary | ICD-10-CM | POA: Diagnosis not present

## 2012-06-18 DIAGNOSIS — M25569 Pain in unspecified knee: Secondary | ICD-10-CM | POA: Diagnosis not present

## 2012-06-22 ENCOUNTER — Ambulatory Visit (HOSPITAL_COMMUNITY)
Admission: RE | Admit: 2012-06-22 | Discharge: 2012-06-22 | Disposition: A | Discharge status: Home / Self Care | Payer: Medicare Other | Source: Ambulatory Visit | Attending: Orthopaedic Surgery | Admitting: Orthopaedic Surgery

## 2012-06-22 DIAGNOSIS — M25569 Pain in unspecified knee: Secondary | ICD-10-CM | POA: Diagnosis not present

## 2012-06-22 DIAGNOSIS — M2240 Chondromalacia patellae, unspecified knee: Secondary | ICD-10-CM | POA: Diagnosis not present

## 2012-06-22 DIAGNOSIS — M1710 Unilateral primary osteoarthritis, unspecified knee: Secondary | ICD-10-CM | POA: Insufficient documentation

## 2012-06-22 DIAGNOSIS — M25561 Pain in right knee: Secondary | ICD-10-CM

## 2012-06-25 DIAGNOSIS — M25569 Pain in unspecified knee: Secondary | ICD-10-CM | POA: Diagnosis not present

## 2012-07-01 DIAGNOSIS — J45909 Unspecified asthma, uncomplicated: Secondary | ICD-10-CM | POA: Diagnosis not present

## 2012-07-01 DIAGNOSIS — E119 Type 2 diabetes mellitus without complications: Secondary | ICD-10-CM | POA: Diagnosis not present

## 2012-07-01 DIAGNOSIS — G8929 Other chronic pain: Secondary | ICD-10-CM | POA: Diagnosis not present

## 2012-07-23 DIAGNOSIS — I10 Essential (primary) hypertension: Secondary | ICD-10-CM | POA: Diagnosis not present

## 2012-07-23 DIAGNOSIS — M25569 Pain in unspecified knee: Secondary | ICD-10-CM | POA: Diagnosis not present

## 2012-07-23 DIAGNOSIS — IMO0002 Reserved for concepts with insufficient information to code with codable children: Secondary | ICD-10-CM | POA: Diagnosis not present

## 2012-08-04 DIAGNOSIS — I10 Essential (primary) hypertension: Secondary | ICD-10-CM | POA: Diagnosis not present

## 2012-08-04 DIAGNOSIS — M25569 Pain in unspecified knee: Secondary | ICD-10-CM | POA: Diagnosis not present

## 2012-08-25 DIAGNOSIS — M1710 Unilateral primary osteoarthritis, unspecified knee: Secondary | ICD-10-CM | POA: Diagnosis not present

## 2012-08-25 DIAGNOSIS — IMO0002 Reserved for concepts with insufficient information to code with codable children: Secondary | ICD-10-CM | POA: Diagnosis not present

## 2012-09-16 DIAGNOSIS — M25569 Pain in unspecified knee: Secondary | ICD-10-CM | POA: Diagnosis not present

## 2012-09-16 DIAGNOSIS — I10 Essential (primary) hypertension: Secondary | ICD-10-CM | POA: Diagnosis not present

## 2012-09-23 ENCOUNTER — Encounter: Payer: Self-pay | Admitting: *Deleted

## 2012-09-26 ENCOUNTER — Encounter: Payer: Self-pay | Admitting: *Deleted

## 2012-10-01 ENCOUNTER — Ambulatory Visit (INDEPENDENT_AMBULATORY_CARE_PROVIDER_SITE_OTHER): Payer: Medicare Other | Admitting: Family Medicine

## 2012-10-01 ENCOUNTER — Encounter: Payer: Self-pay | Admitting: Family Medicine

## 2012-10-01 VITALS — BP 156/90 | HR 90 | Wt 312.5 lb

## 2012-10-01 DIAGNOSIS — E785 Hyperlipidemia, unspecified: Secondary | ICD-10-CM | POA: Diagnosis not present

## 2012-10-01 DIAGNOSIS — E119 Type 2 diabetes mellitus without complications: Secondary | ICD-10-CM | POA: Diagnosis not present

## 2012-10-01 DIAGNOSIS — G8929 Other chronic pain: Secondary | ICD-10-CM | POA: Insufficient documentation

## 2012-10-01 DIAGNOSIS — G894 Chronic pain syndrome: Secondary | ICD-10-CM | POA: Diagnosis not present

## 2012-10-01 DIAGNOSIS — I10 Essential (primary) hypertension: Secondary | ICD-10-CM | POA: Diagnosis not present

## 2012-10-01 DIAGNOSIS — R1013 Epigastric pain: Secondary | ICD-10-CM

## 2012-10-01 LAB — LIPID PANEL
Cholesterol: 145 mg/dL (ref 0–200)
HDL: 33 mg/dL — ABNORMAL LOW (ref >39)
LDL Cholesterol: 91 mg/dL (ref 0–99)
Total CHOL/HDL Ratio: 4.4 Ratio
Triglycerides: 107 mg/dL (ref <150)
VLDL: 21 mg/dL (ref 0–40)

## 2012-10-01 LAB — HEPATIC FUNCTION PANEL
ALT: 17 U/L (ref 0–53)
AST: 16 U/L (ref 0–37)
Albumin: 4.3 g/dL (ref 3.5–5.2)
Alkaline Phosphatase: 79 U/L (ref 39–117)
Bilirubin, Direct: 0.1 mg/dL (ref 0.0–0.3)
Indirect Bilirubin: 0.4 mg/dL (ref 0.0–0.9)
Total Bilirubin: 0.5 mg/dL (ref 0.3–1.2)
Total Protein: 6.5 g/dL (ref 6.0–8.3)

## 2012-10-01 LAB — POCT GLYCOSYLATED HEMOGLOBIN (HGB A1C): Hemoglobin A1C: 5.8

## 2012-10-01 NOTE — Patient Instructions (Signed)
Stop actos as directed. Call us if glu fasting numbers exceed 175.

## 2012-10-01 NOTE — Progress Notes (Signed)
  Subjective:    Patient ID: Shaun Harris, male    DOB: Nov 27, 1965, 47 y.o.   MRN: [id number]  Diabetes He has type 2 diabetes mellitus. His disease course has been improving. Hypoglycemia symptoms include headaches (terrible headaches. ). Pertinent negatives for diabetes include no blurred vision, no chest pain and no fatigue. Symptoms are improving. (Significant swelling. Both legs chronic but getting worse) Risk factors for coronary artery disease include dyslipidemia and male sex. Current diabetic treatment includes insulin injections and diet. He is compliant with treatment most of the time.   also chronic pain. Fairly well controlled on morphine. Ongoing severe headaches. Recently had a spell that almost sent him to the emergency room. Patient would like to get off Actos if at all possible    Review of Systems  Constitutional: Positive for activity change. Negative for fatigue.  HENT: Positive for facial swelling.   Eyes: Negative for blurred vision.  Respiratory: Positive for cough. Negative for apnea and chest tightness.   Cardiovascular: Positive for leg swelling. Negative for chest pain and palpitations.  Gastrointestinal: Negative for abdominal pain.  Endocrine: Negative for heat intolerance.  Neurological: Positive for headaches (terrible headaches. ).  All other systems reviewed and are negative.       Results for orders placed in visit on 10/01/12  POCT GLYCOSYLATED HEMOGLOBIN (HGB A1C)      Result Value Range   Hemoglobin A1C 5.8     Objective:   Physical Exam  Constitutional: He appears well-developed.  HENT:  Head: Normocephalic and atraumatic.  Eyes: Pupils are equal, round, and reactive to light.  Neck: Neck supple.  Cardiovascular: Normal rate and regular rhythm.   No murmur heard. Pulmonary/Chest: Effort normal. No respiratory distress.  Musculoskeletal: He exhibits edema (2+ edema both legs).  Skin: Skin is warm.  Skin sensory exam intact with feet           Assessment & Plan:  Impression #1 type 2 diabetes control tight. #2 chronic pain ongoing. Patient in need of medications. #3 hyperlipidemia status uncertain discuss. #4 peripheral edema likely secondary to Actos.  Plan appropriate blood work. Medications refilled. Diet exercise discussed. Stop Actos. Call if glucoses rise.

## 2012-10-28 DIAGNOSIS — I10 Essential (primary) hypertension: Secondary | ICD-10-CM | POA: Diagnosis not present

## 2012-10-28 DIAGNOSIS — M25569 Pain in unspecified knee: Secondary | ICD-10-CM | POA: Diagnosis not present

## 2012-11-05 ENCOUNTER — Telehealth: Payer: Self-pay | Admitting: Family Medicine

## 2012-11-05 NOTE — Telephone Encounter (Signed)
Patient needs a refill of his xanax to [facility]

## 2012-11-05 NOTE — Telephone Encounter (Signed)
Notified Amy (wife) that RX was faxed to pharmacy.

## 2012-11-05 NOTE — Telephone Encounter (Signed)
Ok if time 

## 2012-11-13 ENCOUNTER — Telehealth: Payer: Self-pay | Admitting: Family Medicine

## 2012-11-13 MED ORDER — HYDROCODONE-ACETAMINOPHEN 10-325 MG PO TABS: 1.0000 | ORAL_TABLET | ORAL | Status: DC | PRN

## 2012-11-13 NOTE — Telephone Encounter (Signed)
Ok. i think chart came your way

## 2012-11-13 NOTE — Telephone Encounter (Signed)
Patient needs a refill of hydrocodone to [facility]

## 2012-11-16 ENCOUNTER — Telehealth: Payer: Self-pay | Admitting: *Deleted

## 2012-12-14 ENCOUNTER — Other Ambulatory Visit: Payer: Self-pay

## 2012-12-14 ENCOUNTER — Other Ambulatory Visit: Payer: Self-pay | Admitting: *Deleted

## 2012-12-14 MED ORDER — INSULIN GLARGINE 100 UNIT/ML ~~LOC~~ SOLN: 54.0000 [IU] | Freq: Two times a day (BID) | SUBCUTANEOUS | Status: DC

## 2012-12-14 MED ORDER — HYDROCODONE-ACETAMINOPHEN 10-325 MG PO TABS: 1.0000 | ORAL_TABLET | ORAL | Status: DC | PRN

## 2012-12-14 MED ORDER — CHLORZOXAZONE 500 MG PO TABS: 500.0000 mg | ORAL_TABLET | Freq: Three times a day (TID) | ORAL | Status: DC | PRN

## 2012-12-16 ENCOUNTER — Ambulatory Visit (INDEPENDENT_AMBULATORY_CARE_PROVIDER_SITE_OTHER): Payer: Medicare Other | Admitting: Family Medicine

## 2012-12-16 ENCOUNTER — Encounter: Payer: Self-pay | Admitting: Family Medicine

## 2012-12-16 VITALS — BP 132/82 | Temp 97.7°F | Wt 314.8 lb

## 2012-12-16 DIAGNOSIS — L0390 Cellulitis, unspecified: Secondary | ICD-10-CM | POA: Diagnosis not present

## 2012-12-16 DIAGNOSIS — L0291 Cutaneous abscess, unspecified: Secondary | ICD-10-CM

## 2012-12-16 MED ORDER — DOXYCYCLINE HYCLATE 100 MG PO CAPS: 100.0000 mg | ORAL_CAPSULE | Freq: Two times a day (BID) | ORAL | Status: DC

## 2012-12-16 NOTE — Progress Notes (Signed)
  Subjective:    Patient ID: Shaun Harris, male    DOB: 1966-01-21, 47 y.o.   MRN: [id number]  HPI Patient arrives office with right leg pain. And swelling. Progressive over the past week. No obvious fever. No chills. Tender at times. Now draining.   Review of Systems    no chest pain no shortness of breath. ROS otherwise negative. Objective:   Physical Exam  Alert mild distress secondary to leg pain. Vitals stable. Lungs clear heart regular rate and rhythm. Right anterior leg tender positive discharge evident. Zone of erythema around weeping wound.     Assessment & Plan:    Impression cellulitis discussed at length. Plan Doxy 100 twice a day. Symptomatic care discussed. Expect gradual resolution.

## 2012-12-20 LAB — WOUND CULTURE
Gram Stain: NONE SEEN
Gram Stain: NONE SEEN

## 2012-12-24 MED ORDER — LEVOFLOXACIN 500 MG PO TABS: 500.0000 mg | ORAL_TABLET | Freq: Every day | ORAL | Status: AC

## 2012-12-24 NOTE — Addendum Note (Signed)
Addended by: Metro Kung on: 12/24/2012 12:01 PM   Modules accepted: Orders

## 2012-12-30 ENCOUNTER — Ambulatory Visit (INDEPENDENT_AMBULATORY_CARE_PROVIDER_SITE_OTHER): Payer: Medicare Other | Admitting: Family Medicine

## 2012-12-30 ENCOUNTER — Encounter: Payer: Self-pay | Admitting: Family Medicine

## 2012-12-30 VITALS — BP 167/92 | Temp 98.1°F | Wt 308.8 lb

## 2012-12-30 DIAGNOSIS — G894 Chronic pain syndrome: Secondary | ICD-10-CM

## 2012-12-30 DIAGNOSIS — E119 Type 2 diabetes mellitus without complications: Secondary | ICD-10-CM

## 2012-12-30 DIAGNOSIS — I10 Essential (primary) hypertension: Secondary | ICD-10-CM

## 2012-12-30 DIAGNOSIS — E785 Hyperlipidemia, unspecified: Secondary | ICD-10-CM | POA: Diagnosis not present

## 2012-12-30 DIAGNOSIS — M25569 Pain in unspecified knee: Secondary | ICD-10-CM | POA: Diagnosis not present

## 2012-12-30 NOTE — Progress Notes (Signed)
  Subjective:    Patient ID: Shaun Harris, male    DOB: 06-20-1966, 47 y.o.   MRN: [id number]  HPI  Leg still painful. Feels throbing still.  States sugars overall are in good level. Patient claims compliance with his diabetes medicine. No low sugar spells. Trying to watch diet.  Chronic pain ongoing.still needs medicine. Patient claims compliance with his medication.  Positive history of hypertension. Watching salt intake. Unfortunately still smoking. Claims compliance with medication.  Review of Systems No chest pain no headache no abdominal pain no change in bowel habits ROS otherwise negative.    Objective:   Physical Exam Alert no acute distress. Vitals reviewed. Blood pressure improved on repeat 144/82. Lungs clear. Heart regular in rhythm. Feet sensation intact. Pulses good. No significant edema. Anterior leg still inflamed and somewhat tender though improved.   Results for orders placed in visit on 12/16/12  WOUND CULTURE      Result Value Range   Culture       Value: Abundant SERRATIA MARCESCENS     Moderate STAPHYLOCOCCUS AUREUS   GRAM STAIN No WBC Seen     GRAM STAIN No Squamous Epithelial Cells Seen     GRAM STAIN Rare Gram Positive Cocci In Pairs     Organism ID, Bacteria SERRATIA MARCESCENS     Organism ID, Bacteria STAPHYLOCOCCUS AUREUS         Assessment & Plan:  Impression 1 resolving cellulitis discussed. #2 hypertension decent control but not ideal. #3 type 2 diabetes control improving. #4 asthma ongoing. #5 chronic pain discussed. Plan encouraged to stop smoking. Finish antibiotics. Pain medicines refilled. Recheck in 3 months. WSL

## 2013-01-07 ENCOUNTER — Telehealth: Payer: Self-pay | Admitting: Family Medicine

## 2013-01-07 NOTE — Telephone Encounter (Signed)
I see where patient has had 2 courses of antibiotics.  According to C&S report, Levaquin should have worked.  Did it improve?  Resolve and come back?  Fever?

## 2013-01-07 NOTE — Telephone Encounter (Signed)
Pt needs another antibiotic called in because his leg is still draining and has green puss in it.  [employer].

## 2013-01-08 ENCOUNTER — Other Ambulatory Visit: Payer: Self-pay | Admitting: Nurse Practitioner

## 2013-01-08 MED ORDER — SULFAMETHOXAZOLE-TMP DS 800-160 MG PO TABS: 1.0000 | ORAL_TABLET | Freq: Two times a day (BID) | ORAL | Status: DC

## 2013-01-08 NOTE — Telephone Encounter (Signed)
Discussed with wife Amy antibiotic sent in. Pt to call back next week to schedule appt for a recheck

## 2013-01-08 NOTE — Telephone Encounter (Signed)
Amy states that patient's leg never improved. She states that it is more blisters in the area, it is red, draining green discharge and it looks "nasty". No fever has been noted so far.

## 2013-01-08 NOTE — Telephone Encounter (Signed)
Left message on voicemail to return call.

## 2013-01-08 NOTE — Telephone Encounter (Signed)
Will send in a different antibiotic; will cover both bacteria; recommend office visit next week for recheck

## 2013-01-12 ENCOUNTER — Other Ambulatory Visit: Payer: Self-pay | Admitting: *Deleted

## 2013-01-12 MED ORDER — MESALAMINE ER 0.375 G PO CP24: 375.0000 mg | ORAL_CAPSULE | Freq: Four times a day (QID) | ORAL | Status: DC

## 2013-01-12 MED ORDER — HYDROCODONE-ACETAMINOPHEN 10-325 MG PO TABS: 1.0000 | ORAL_TABLET | ORAL | Status: DC | PRN

## 2013-01-12 MED ORDER — HYOSCYAMINE SULFATE 0.125 MG SL SUBL: 0.1250 mg | SUBLINGUAL_TABLET | SUBLINGUAL | Status: DC | PRN

## 2013-02-02 ENCOUNTER — Encounter: Payer: Self-pay | Admitting: Family Medicine

## 2013-02-02 ENCOUNTER — Ambulatory Visit (INDEPENDENT_AMBULATORY_CARE_PROVIDER_SITE_OTHER): Payer: Medicare Other | Admitting: Family Medicine

## 2013-02-02 VITALS — BP 132/90 | Temp 98.3°F | Wt 309.0 lb

## 2013-02-02 DIAGNOSIS — L02419 Cutaneous abscess of limb, unspecified: Secondary | ICD-10-CM | POA: Diagnosis not present

## 2013-02-02 DIAGNOSIS — L03119 Cellulitis of unspecified part of limb: Secondary | ICD-10-CM | POA: Diagnosis not present

## 2013-02-02 MED ORDER — AMOXICILLIN-POT CLAVULANATE 875-125 MG PO TABS: 1.0000 | ORAL_TABLET | Freq: Two times a day (BID) | ORAL | Status: AC

## 2013-02-02 MED ORDER — MUPIROCIN CALCIUM 2 % EX CREA: TOPICAL_CREAM | CUTANEOUS | Status: DC

## 2013-02-02 MED ORDER — CIPROFLOXACIN HCL 500 MG PO TABS: 500.0000 mg | ORAL_TABLET | Freq: Two times a day (BID) | ORAL | Status: AC

## 2013-02-02 NOTE — Progress Notes (Signed)
  Subjective:    Patient ID: Shaun Harris, male    DOB: 06-29-1966, 47 y.o.   MRN: [id number]  HPI Patient has ongoing challenges with right leg. He has used doxycycline Levaquin and Bactrim in the past. He still continues to drain. Odor at times. No obvious fever or chills. Notes tremendous amount of pain despite his high dose of narcotics. Some swelling ongoing discharge.   Review of Systems ROS otherwise negative    Objective:   Physical Exam  Alert moderate malaise lungs clear. Heart regular rate and rhythm. Anterior leg erythema tenderness positive ulceration with discharge      Assessment & Plan:  Impression cellulitis with central ulcer plan Augmentin twice a day 10 days. Cipro twice a day 10 days. Culture wound wound care consultation. WSL

## 2013-02-05 LAB — WOUND CULTURE
Gram Stain: NONE SEEN
Gram Stain: NONE SEEN
Gram Stain: NONE SEEN

## 2013-02-22 ENCOUNTER — Encounter (HOSPITAL_BASED_OUTPATIENT_CLINIC_OR_DEPARTMENT_OTHER): Payer: Medicare Other | Attending: Plastic Surgery

## 2013-02-22 DIAGNOSIS — J45909 Unspecified asthma, uncomplicated: Secondary | ICD-10-CM | POA: Diagnosis not present

## 2013-02-22 DIAGNOSIS — T07XXXA Unspecified multiple injuries, initial encounter: Secondary | ICD-10-CM | POA: Diagnosis not present

## 2013-02-22 DIAGNOSIS — I8390 Asymptomatic varicose veins of unspecified lower extremity: Secondary | ICD-10-CM | POA: Insufficient documentation

## 2013-02-22 DIAGNOSIS — I10 Essential (primary) hypertension: Secondary | ICD-10-CM | POA: Diagnosis not present

## 2013-02-22 DIAGNOSIS — L97809 Non-pressure chronic ulcer of other part of unspecified lower leg with unspecified severity: Secondary | ICD-10-CM | POA: Insufficient documentation

## 2013-02-22 DIAGNOSIS — I999 Unspecified disorder of circulatory system: Secondary | ICD-10-CM | POA: Diagnosis not present

## 2013-02-22 DIAGNOSIS — G4730 Sleep apnea, unspecified: Secondary | ICD-10-CM | POA: Insufficient documentation

## 2013-02-22 DIAGNOSIS — E1169 Type 2 diabetes mellitus with other specified complication: Secondary | ICD-10-CM | POA: Diagnosis not present

## 2013-02-23 NOTE — Progress Notes (Signed)
[facility]  NAME:  Shaun Harris, Shaun Harris                 ACCOUNT NO.:  [id number]  MEDICAL RECORD NO.:  [id number]      DATE OF BIRTH:  [birth year]  PHYSICIAN:  Wayland Denis, DO       VISIT DATE:  02/22/2013                                  OFFICE VISIT   HISTORY OF PRESENT ILLNESS:  The patient is a 47 year old gentleman who is here for evaluation of his right lower extremity ulcer.  He states he has had this for 3 months.  He has undergone antibiotic treatment and local care without improvement, so he presents for further care.  He says this happened in the past and then it gets better.  He is concerned with the redness, swelling, cellulitis that he has had.  PAST MEDICAL HISTORY:  Positive for hypertension, cellulitis, diabetes, sleep apnea, asthma, headaches, diabetic neuropathy, and osteoarthritis.  SURGERIES:  Right knee surgery, right shoulder, left shoulder, and neck.  MEDICATIONS:  Terazosin, enalapril, Lipitor, Niaspan, Glucotrol, Actos, Lantus, NovoLog, ProAir inhaler, Advair Diskus.  ALLERGIES:  None.  SOCIAL HISTORY:  Married and lives at home.  He is a smoker.  REVIEW OF SYSTEMS:  Otherwise negative, but he is not a very healthy person.  PHYSICAL EXAMINATION:  GENERAL:  On exam, he is alert, oriented, and cooperative.  He is not in any acute distress, but complains of fair bit about his leg. LUNGS:  His breathing is unlabored. HEART:  His heart rate is regular. ABDOMEN:  His abdomen is large, but soft.  No organomegaly appreciated. NECK:  No cervical lymphadenopathy. EXTREMITIES:  His upper and lower extremity pulses are equal bilaterally.  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  This is measuring 2.0 x 1.0 x 0.1.  ASSESSMENT:  Chronic lower extremity vascular insufficiency and diabetic ulcer.  RECOMMENDATION:  Elevation, multivitamin, vitamin C,  zinc, check his pre- albumin, stop smoking, Vascular consult, and a Profore Lite, and see him back in a week.     Wayland Denis, DO     CS/MEDQ  D:  02/22/2013  T:  02/23/2013  Job:  [id number]

## 2013-02-24 ENCOUNTER — Encounter: Payer: Self-pay | Admitting: Cardiovascular Disease

## 2013-02-24 ENCOUNTER — Other Ambulatory Visit (HOSPITAL_COMMUNITY): Payer: Self-pay | Admitting: Plastic Surgery

## 2013-02-24 DIAGNOSIS — I872 Venous insufficiency (chronic) (peripheral): Secondary | ICD-10-CM

## 2013-02-24 DIAGNOSIS — I739 Peripheral vascular disease, unspecified: Secondary | ICD-10-CM

## 2013-03-01 DIAGNOSIS — I999 Unspecified disorder of circulatory system: Secondary | ICD-10-CM | POA: Diagnosis not present

## 2013-03-01 DIAGNOSIS — I8390 Asymptomatic varicose veins of unspecified lower extremity: Secondary | ICD-10-CM | POA: Diagnosis not present

## 2013-03-01 DIAGNOSIS — E1169 Type 2 diabetes mellitus with other specified complication: Secondary | ICD-10-CM | POA: Diagnosis not present

## 2013-03-01 DIAGNOSIS — L97809 Non-pressure chronic ulcer of other part of unspecified lower leg with unspecified severity: Secondary | ICD-10-CM | POA: Diagnosis not present

## 2013-03-01 NOTE — Progress Notes (Signed)
[facility]  NAME:  Shaun Harris, Shaun Harris NO.:  [id number]  MEDICAL RECORD NO.:  [id number]      DATE OF BIRTH:  Jul 08, 1966  PHYSICIAN:  Wayland Denis, DO            VISIT DATE:                                  OFFICE VISIT   HISTORY OF PRESENT ILLNESS:  The patient is a 47 year old male, who is here for followup on his right lower extremity chronic venous insufficiency ulcer.  He is still smoking.  He has had the wrap with Silvercel and Santyl over the past week, and overall the area looks better with improvement in the swelling and a decrease in the wound size.  There has been no change in his medications or social history.  PHYSICAL EXAMINATION:  GENERAL:  He is alert, oriented, cooperative, not in any acute distress. HEENT:  His pupils are equal.  Extraocular muscles are intact. LUNGS:  His breathing is unlabored. HEART:  His heart rate is regular. EXTREMITIES:  He still has obvious venous disease of the lower extremities.  Pulses present.  He is scheduled for a lower extremity ultrasound for Friday.  We continue to recommend elevation, multivitamin, vitamin C, zinc, protein, and we will do Silvercel unwrap.  His pre-albumin came back 21, so encouraged him to keep that elevated.     Wayland Denis, DO     CS/MEDQ  D:  03/01/2013  T:  03/01/2013  Job:  [id number]

## 2013-03-05 ENCOUNTER — Ambulatory Visit (HOSPITAL_COMMUNITY)
Admission: RE | Admit: 2013-03-05 | Discharge: 2013-03-05 | Disposition: A | Discharge status: Home / Self Care | Payer: Medicare Other | Source: Ambulatory Visit | Attending: Cardiovascular Disease | Admitting: Cardiovascular Disease

## 2013-03-05 DIAGNOSIS — I739 Peripheral vascular disease, unspecified: Secondary | ICD-10-CM | POA: Diagnosis not present

## 2013-03-05 DIAGNOSIS — L97909 Non-pressure chronic ulcer of unspecified part of unspecified lower leg with unspecified severity: Secondary | ICD-10-CM

## 2013-03-05 DIAGNOSIS — I872 Venous insufficiency (chronic) (peripheral): Secondary | ICD-10-CM

## 2013-03-05 DIAGNOSIS — I999 Unspecified disorder of circulatory system: Secondary | ICD-10-CM | POA: Diagnosis not present

## 2013-03-05 DIAGNOSIS — M7989 Other specified soft tissue disorders: Secondary | ICD-10-CM

## 2013-03-05 DIAGNOSIS — M79609 Pain in unspecified limb: Secondary | ICD-10-CM

## 2013-03-05 NOTE — Progress Notes (Signed)
Venous Duplex Lower Ext. Completed. Edvin Albus, BS, RDMS, RVT  

## 2013-03-05 NOTE — Progress Notes (Signed)
Lower arterial duplex complete [facility] 

## 2013-03-08 DIAGNOSIS — I8390 Asymptomatic varicose veins of unspecified lower extremity: Secondary | ICD-10-CM | POA: Diagnosis not present

## 2013-03-08 DIAGNOSIS — E1169 Type 2 diabetes mellitus with other specified complication: Secondary | ICD-10-CM | POA: Diagnosis not present

## 2013-03-08 DIAGNOSIS — L97809 Non-pressure chronic ulcer of other part of unspecified lower leg with unspecified severity: Secondary | ICD-10-CM | POA: Diagnosis not present

## 2013-03-08 DIAGNOSIS — I999 Unspecified disorder of circulatory system: Secondary | ICD-10-CM | POA: Diagnosis not present

## 2013-03-10 ENCOUNTER — Encounter: Payer: Self-pay | Admitting: Cardiovascular Disease

## 2013-03-10 ENCOUNTER — Ambulatory Visit (INDEPENDENT_AMBULATORY_CARE_PROVIDER_SITE_OTHER): Payer: Medicare Other | Admitting: Cardiovascular Disease

## 2013-03-10 VITALS — BP 102/68 | HR 89 | Ht 67.0 in | Wt 299.0 lb

## 2013-03-10 DIAGNOSIS — J449 Chronic obstructive pulmonary disease, unspecified: Secondary | ICD-10-CM | POA: Diagnosis not present

## 2013-03-10 DIAGNOSIS — Z8249 Family history of ischemic heart disease and other diseases of the circulatory system: Secondary | ICD-10-CM

## 2013-03-10 DIAGNOSIS — I10 Essential (primary) hypertension: Secondary | ICD-10-CM

## 2013-03-10 DIAGNOSIS — R600 Localized edema: Secondary | ICD-10-CM

## 2013-03-10 DIAGNOSIS — IMO0001 Reserved for inherently not codable concepts without codable children: Secondary | ICD-10-CM

## 2013-03-10 DIAGNOSIS — I83009 Varicose veins of unspecified lower extremity with ulcer of unspecified site: Secondary | ICD-10-CM | POA: Diagnosis not present

## 2013-03-10 DIAGNOSIS — L97909 Non-pressure chronic ulcer of unspecified part of unspecified lower leg with unspecified severity: Secondary | ICD-10-CM | POA: Diagnosis not present

## 2013-03-10 DIAGNOSIS — F17200 Nicotine dependence, unspecified, uncomplicated: Secondary | ICD-10-CM

## 2013-03-10 DIAGNOSIS — R609 Edema, unspecified: Secondary | ICD-10-CM

## 2013-03-10 DIAGNOSIS — Z720 Tobacco use: Secondary | ICD-10-CM | POA: Insufficient documentation

## 2013-03-10 DIAGNOSIS — E119 Type 2 diabetes mellitus without complications: Secondary | ICD-10-CM | POA: Insufficient documentation

## 2013-03-10 MED ORDER — FUROSEMIDE 20 MG PO TABS: 20.0000 mg | ORAL_TABLET | Freq: Every day | ORAL | Status: DC

## 2013-03-10 NOTE — Progress Notes (Signed)
03/10/2013 Shaun Harris   11-08-1965  [id number]  Primary Physician Shaun Asa, MD Primary Cardiologist: Shaun Gess MD Roseanne Reno   HPI:  Mr. Is an unfortunate 47 year old morbidly overweight married Caucasian male father of 3 children accompanied by his wife today. He has been disabled since 1997 for various reasons. His cardiac risk factor profile is positive for 2 diabetes, hypertension and hyperlipidemia. He smoked 1-1/2 packs a day for the last 30 years. His father recently had bypass surgery. He has never had a heart attack or stroke and denies chest pain or shortness of breath. He said changes of venous stasis the last 5 or 6 years and was referred by Dr. Alena Harris. [facility]inger at [facility] will care center for a slowly healing right pretibial venous stasis ulcer. Arterial and venous Doppler studies performed at our lab were normal. In particular, there is no evidence of venous reflux disease.   Current Outpatient Prescriptions  Medication Sig Dispense Refill  . ADVAIR DISKUS 250-50 MCG/DOSE AEPB Inhale 1 puff into the lungs 2 (two) times daily.       Marland Kitchen ALPRAZolam (XANAX) 1 MG tablet Take 1 mg by mouth 2 (two) times daily as needed.      Marland Kitchen aspirin EC 325 MG tablet Take 325 mg by mouth at bedtime.      Marland Kitchen atorvastatin (LIPITOR) 20 MG tablet Take 20 mg by mouth every morning.       . chlorzoxazone (PARAFON) 500 MG tablet Take 1 tablet (500 mg total) by mouth 3 (three) times daily as needed.  90 tablet  5  . diclofenac sodium (VOLTAREN) 1 % GEL Apply topically 3 (three) times daily.      . enalapril (VASOTEC) 20 MG tablet Take 20 mg by mouth 2 (two) times daily.       Marland Kitchen glipiZIDE (GLUCOTROL XL) 10 MG 24 hr tablet 2 (two) times daily.       Marland Kitchen HYDROcodone-acetaminophen (NORCO) 10-325 MG per tablet Take 1 tablet by mouth every 4 (four) hours as needed for pain. No more than 5 a day-Must last 30 days.  150 tablet  2  . hyoscyamine (LEVSIN/SL) 0.125 MG SL tablet Place 1 tablet  (0.125 mg total) under the tongue every 4 (four) hours as needed for cramping.  30 tablet  2  . insulin glargine (LANTUS) 100 UNIT/ML injection Inject 0.54 mLs (54 Units total) into the skin 2 (two) times daily.  10 mL  11  . levETIRAcetam (KEPPRA) 500 MG tablet Take 1,500 mg by mouth 2 (two) times daily.       . mesalamine (APRISO) 0.375 G 24 hr capsule Take 1 capsule (0.375 g total) by mouth 4 (four) times daily.  120 capsule  2  . morphine (MS CONTIN) 30 MG 12 hr tablet Take 30 mg by mouth 2 (two) times daily.       . Multiple Vitamin (MULTIVITAMIN) capsule Take 1 capsule by mouth daily.      Marland Kitchen NIASPAN 1000 MG CR tablet Take 2,000 mg by mouth at bedtime.       . NON FORMULARY cpap at night with 2L of oxygen      . NOVOLOG 100 UNIT/ML injection Inject 4-12 Units into the skin 3 (three) times daily before meals.       Marland Kitchen PROAIR HFA 108 (90 BASE) MCG/ACT inhaler Inhale 2 puffs into the lungs every 4 (four) hours as needed. For shortness of breath      .  terazosin (HYTRIN) 10 MG capsule Take 10 mg by mouth at bedtime.       . vitamin C (ASCORBIC ACID) 500 MG tablet Take 500 mg by mouth daily.      Marland Kitchen zinc gluconate 50 MG tablet Take 50 mg by mouth. 4 tablets daily      . furosemide (LASIX) 20 MG tablet Take 1 tablet (20 mg total) by mouth daily.  90 tablet  3   No current facility-administered medications for this visit.    No Known Allergies  History   Social History  . Marital Status: Married    Spouse Name: N/A    Number of Children: N/A  . Years of Education: N/A   Occupational History  . Not on file.   Social History Main Topics  . Smoking status: Current Every Day Smoker -- 1.00 packs/day for 35 years    Types: Cigarettes  . Smokeless tobacco: Not on file  . Alcohol Use: No  . Drug Use: No  . Sexual Activity: Not on file   Other Topics Concern  . Not on file   Social History Narrative  . No narrative on file     Review of Systems: General: negative for chills, fever,  night sweats or weight changes.  Cardiovascular: negative for chest pain, dyspnea on exertion, edema, orthopnea, palpitations, paroxysmal nocturnal dyspnea or shortness of breath Dermatological: negative for rash Respiratory: negative for cough or wheezing Urologic: negative for hematuria Abdominal: negative for nausea, vomiting, diarrhea, bright red blood per rectum, melena, or hematemesis Neurologic: negative for visual changes, syncope, or dizziness All other systems reviewed and are otherwise negative except as noted above.    Blood pressure 102/68, pulse 89, height 5\' 7"  (1.702 m), weight 299 lb (135.626 kg).  General appearance: alert and no distress Neck: no adenopathy, no carotid bruit, no JVD, supple, symmetrical, trachea midline and thyroid not enlarged, symmetric, no tenderness/mass/nodules Lungs: clear to auscultation bilaterally Heart: regular rate and rhythm, S1, S2 normal, no murmur, click, rub or gallop Extremities: extremities normal, atraumatic, no cyanosis or edema, venous stasis dermatitis noted and 2+ pedal pulses Pulses: 2+ and symmetric  EKG normal sinus rhythm at 89 without ST or T wave changes  ASSESSMENT AND PLAN:   Venous stasis ulcer Patient apparently had changes of venostasis 45-6 years. He developed a right pretibial venostasis ulcer and has been treated by Dr. Shella [facility]. He was referred here for further evaluation. Arterial and venous Doppler studies were essentially normal.      Shaun Gess MD [facility], [facility] 03/10/2013 4:54 PM

## 2013-03-10 NOTE — Assessment & Plan Note (Signed)
Patient apparently had changes of venostasis 45-6 years. He developed a right pretibial venostasis ulcer and has been treated by Dr. Shella Spe[facility]. He was referred here for further evaluation. Arterial and venous Doppler studies were essentially normal.

## 2013-03-10 NOTE — Patient Instructions (Addendum)
  We will see you back in follow up only as needed  Dr Allyson Sabal has ordered for you to start lasix 20mg  daily   Give [place] a call when you are ready to get measured for the compression hose

## 2013-03-22 ENCOUNTER — Encounter (HOSPITAL_BASED_OUTPATIENT_CLINIC_OR_DEPARTMENT_OTHER): Payer: Medicare Other | Attending: Plastic Surgery

## 2013-03-22 DIAGNOSIS — I739 Peripheral vascular disease, unspecified: Secondary | ICD-10-CM | POA: Insufficient documentation

## 2013-03-22 DIAGNOSIS — L97809 Non-pressure chronic ulcer of other part of unspecified lower leg with unspecified severity: Secondary | ICD-10-CM | POA: Diagnosis not present

## 2013-03-22 DIAGNOSIS — I872 Venous insufficiency (chronic) (peripheral): Secondary | ICD-10-CM | POA: Diagnosis not present

## 2013-03-22 DIAGNOSIS — F17200 Nicotine dependence, unspecified, uncomplicated: Secondary | ICD-10-CM | POA: Insufficient documentation

## 2013-03-22 DIAGNOSIS — I890 Lymphedema, not elsewhere classified: Secondary | ICD-10-CM | POA: Diagnosis not present

## 2013-03-23 NOTE — Progress Notes (Signed)
[facility]  NAME:  Shaun Harris, Shaun Harris NO.:  [id number]  MEDICAL RECORD NO.:  [id number]      DATE OF BIRTH:  06-Aug-1965  PHYSICIAN:  Wayland Denis, DO            VISIT DATE:                                  OFFICE VISIT   The patient is a 48 year old gentleman who is here for followup on his right lower extremity, chronic venous insufficiency ulcers.  He is using Profore Lite.  There is improvement in the overall appearance of both the leg and the wound.  He is still smoking and has not increased his protein intake according to what he reports.  There has been no change in his medications or social history.  On exam, he is alert, oriented, cooperative, not in any acute distress. He is pleasant.  Pupils are equal.  Extraocular muscles are intact. Review of systems is otherwise negative.  His breathing is unlabored, and his heart rate is regular.  He has a lot of hemosiderosis of the lower extremities.  He had fortunately no sign of infection, just breakdown, which does seem to be improving.  We will continue with the Profore.  Recommend elevation, multivitamin, vitamin C, zinc, no smoking, and increasing protein.     Wayland Denis, DO     CS/MEDQ  D:  03/22/2013  T:  03/23/2013  Job:  [id number]

## 2013-03-31 DIAGNOSIS — F17200 Nicotine dependence, unspecified, uncomplicated: Secondary | ICD-10-CM | POA: Diagnosis not present

## 2013-03-31 DIAGNOSIS — I10 Essential (primary) hypertension: Secondary | ICD-10-CM | POA: Diagnosis not present

## 2013-03-31 DIAGNOSIS — M25569 Pain in unspecified knee: Secondary | ICD-10-CM | POA: Diagnosis not present

## 2013-04-01 ENCOUNTER — Encounter: Payer: Self-pay | Admitting: Family Medicine

## 2013-04-01 ENCOUNTER — Ambulatory Visit (INDEPENDENT_AMBULATORY_CARE_PROVIDER_SITE_OTHER): Payer: Medicare Other | Admitting: Family Medicine

## 2013-04-01 VITALS — BP 128/80 | Ht 67.0 in | Wt 295.0 lb

## 2013-04-01 DIAGNOSIS — Z23 Encounter for immunization: Secondary | ICD-10-CM

## 2013-04-01 DIAGNOSIS — E782 Mixed hyperlipidemia: Secondary | ICD-10-CM

## 2013-04-01 DIAGNOSIS — E119 Type 2 diabetes mellitus without complications: Secondary | ICD-10-CM | POA: Diagnosis not present

## 2013-04-01 DIAGNOSIS — I10 Essential (primary) hypertension: Secondary | ICD-10-CM

## 2013-04-01 DIAGNOSIS — Z79899 Other long term (current) drug therapy: Secondary | ICD-10-CM

## 2013-04-01 DIAGNOSIS — J449 Chronic obstructive pulmonary disease, unspecified: Secondary | ICD-10-CM

## 2013-04-01 LAB — LIPID PANEL
Cholesterol: 178 mg/dL (ref 0–200)
HDL: 36 mg/dL — ABNORMAL LOW (ref >39)
LDL Cholesterol: 108 mg/dL — ABNORMAL HIGH (ref 0–99)
Total CHOL/HDL Ratio: 4.9 Ratio
Triglycerides: 168 mg/dL — ABNORMAL HIGH (ref <150)
VLDL: 34 mg/dL (ref 0–40)

## 2013-04-01 LAB — BASIC METABOLIC PANEL
BUN: 15 mg/dL (ref 6–23)
CO2: 30 mEq/L (ref 19–32)
Calcium: 10.2 mg/dL (ref 8.4–10.5)
Chloride: 100 mEq/L (ref 96–112)
Creat: 0.78 mg/dL (ref 0.50–1.35)
Glucose, Bld: 216 mg/dL — ABNORMAL HIGH (ref 70–99)
Potassium: 4.9 mEq/L (ref 3.5–5.3)
Sodium: 141 mEq/L (ref 135–145)

## 2013-04-01 LAB — POCT GLYCOSYLATED HEMOGLOBIN (HGB A1C): Hemoglobin A1C: 6.3

## 2013-04-01 LAB — HEPATIC FUNCTION PANEL
ALT: 22 U/L (ref 0–53)
AST: 13 U/L (ref 0–37)
Albumin: 4.7 g/dL (ref 3.5–5.2)
Alkaline Phosphatase: 122 U/L — ABNORMAL HIGH (ref 39–117)
Bilirubin, Direct: 0.1 mg/dL (ref 0.0–0.3)
Indirect Bilirubin: 0.3 mg/dL (ref 0.0–0.9)
Total Bilirubin: 0.4 mg/dL (ref 0.3–1.2)
Total Protein: 7.6 g/dL (ref 6.0–8.3)

## 2013-04-01 MED ORDER — MORPHINE SULFATE ER 30 MG PO TBCR: 30.0000 mg | EXTENDED_RELEASE_TABLET | Freq: Two times a day (BID) | ORAL | Status: DC

## 2013-04-01 MED ORDER — HYDROCODONE-ACETAMINOPHEN 10-325 MG PO TABS: 1.0000 | ORAL_TABLET | ORAL | Status: DC | PRN

## 2013-04-01 NOTE — Progress Notes (Signed)
  Subjective:    Patient ID: Shaun Harris, male    DOB: Jan 30, 1966, 47 y.o.   MRN: [id number]  Diabetes He presents for his follow-up diabetic visit. He has type 2 diabetes mellitus. His disease course has been stable. There are no hypoglycemic associated symptoms. There are no diabetic associated symptoms. There are no hypoglycemic complications. Symptoms are stable. There are no diabetic complications. There are no known risk factors for coronary artery disease. Current diabetic treatment includes insulin injections and oral agent (dual therapy). He is compliant with treatment all of the time.  Patient states he has no concerns today.   Results for orders placed in visit on 04/01/13  POCT GLYCOSYLATED HEMOGLOBIN (HGB A1C)      Result Value Range   Hemoglobin A1C 6.3    glu mostly in 90 to 100 range.    Breathing stable some cong. No wheezes  Headaches ongoing/severe in nature. Disabling.  Compliant with bp meds. Trying to cut down salt intake. Blood pressure usually good elsewhere.  Wound care folks watching pt. Recent cellulitis.small sore and swelling  Claims compliance with cholesterol medication. Review of Systems No chest pain no back pain no abdominal pain ROS otherwise negative    Objective:   Physical Exam  Alert HEENT normal. Lungs clear. Heart regular rate and rhythm. Feet without edema      Assessment & Plan:  Impression 1 type 2 diabetes good control. #2 hypertension good control. #3 hyperlipidemia status uncertain. #4 chronic pain ongoing severe and disabling plan medications written out. Appropriate blood work. Recheck in several months further recommendations based results. WSL

## 2013-04-05 DIAGNOSIS — L97809 Non-pressure chronic ulcer of other part of unspecified lower leg with unspecified severity: Secondary | ICD-10-CM | POA: Diagnosis not present

## 2013-04-05 DIAGNOSIS — I739 Peripheral vascular disease, unspecified: Secondary | ICD-10-CM | POA: Diagnosis not present

## 2013-04-05 DIAGNOSIS — I872 Venous insufficiency (chronic) (peripheral): Secondary | ICD-10-CM | POA: Diagnosis not present

## 2013-04-05 DIAGNOSIS — I890 Lymphedema, not elsewhere classified: Secondary | ICD-10-CM | POA: Diagnosis not present

## 2013-04-06 NOTE — Progress Notes (Signed)
[facility]  NAME:  Shaun Harris, Shaun Harris NO.:  [id number]  MEDICAL RECORD NO.:  [id number]      DATE OF BIRTH:  July 09, 1966  PHYSICIAN:  Wayland Denis, DO            VISIT DATE:                                  OFFICE VISIT   The patient is a 47 year old male here for evaluation of his right lower extremity chronic lymphedema and peripheral vascular disease.  He is actually doing very well and is completely healed up.  He has not made major changes in his lifestyle but enough to cause this to heal.  We gave him a script for pressure stockings and have recommended strongly that he get those filled and start wearing them on both legs.  He has acknowledged agreeing with this and we will see him back as needed.     Wayland Denis, DO     CS/MEDQ  D:  04/05/2013  T:  04/06/2013  Job:  [id number]

## 2013-04-08 ENCOUNTER — Encounter: Payer: Self-pay | Admitting: Family Medicine

## 2013-04-14 ENCOUNTER — Other Ambulatory Visit: Payer: Self-pay | Admitting: *Deleted

## 2013-04-14 MED ORDER — MESALAMINE ER 0.375 G PO CP24: 375.0000 mg | ORAL_CAPSULE | Freq: Four times a day (QID) | ORAL | Status: DC

## 2013-04-15 ENCOUNTER — Encounter: Payer: Self-pay | Admitting: Family Medicine

## 2013-04-29 ENCOUNTER — Telehealth: Payer: Self-pay | Admitting: Family Medicine

## 2013-04-29 MED ORDER — ALPRAZOLAM 1 MG PO TABS: 1.0000 mg | ORAL_TABLET | Freq: Two times a day (BID) | ORAL | Status: DC | PRN

## 2013-04-29 NOTE — Telephone Encounter (Signed)
Patient needs Rx for alprazolam    [facility]

## 2013-04-29 NOTE — Telephone Encounter (Signed)
Please call when complete. °

## 2013-04-29 NOTE — Telephone Encounter (Signed)
May ref plus five ref

## 2013-04-29 NOTE — Telephone Encounter (Signed)
Last office visit 04/01/13.

## 2013-04-29 NOTE — Telephone Encounter (Signed)
Wife was notified RX will be faxed to pharmacy.

## 2013-05-15 ENCOUNTER — Other Ambulatory Visit: Payer: Self-pay | Admitting: *Deleted

## 2013-05-15 MED ORDER — INSULIN GLARGINE 100 UNIT/ML ~~LOC~~ SOLN: 54.0000 [IU] | Freq: Two times a day (BID) | SUBCUTANEOUS | Status: DC

## 2013-06-01 ENCOUNTER — Other Ambulatory Visit: Payer: Self-pay | Admitting: Family Medicine

## 2013-06-05 ENCOUNTER — Other Ambulatory Visit: Payer: Self-pay | Admitting: Family Medicine

## 2013-06-14 ENCOUNTER — Other Ambulatory Visit: Payer: Self-pay | Admitting: Family Medicine

## 2013-06-28 ENCOUNTER — Encounter: Payer: Self-pay | Admitting: Family Medicine

## 2013-06-28 ENCOUNTER — Ambulatory Visit (INDEPENDENT_AMBULATORY_CARE_PROVIDER_SITE_OTHER): Payer: Medicare Other | Admitting: Family Medicine

## 2013-06-28 VITALS — BP 130/82 | Temp 98.5°F | Ht 67.0 in | Wt 286.0 lb

## 2013-06-28 DIAGNOSIS — J111 Influenza due to unidentified influenza virus with other respiratory manifestations: Secondary | ICD-10-CM

## 2013-06-28 MED ORDER — LEVOFLOXACIN 500 MG PO TABS: 500.0000 mg | ORAL_TABLET | Freq: Every day | ORAL | Status: AC

## 2013-06-28 MED ORDER — PREDNISONE 20 MG PO TABS: ORAL_TABLET | ORAL | Status: DC

## 2013-06-28 NOTE — Progress Notes (Signed)
   Subjective:    Patient ID: Shaun Harris, male    DOB: 04-10-1966, 47 y.o.   MRN: [id number]  Cough This is a new problem. The current episode started in the past 7 days. Associated symptoms include a fever, headaches, nasal congestion and wheezing.   Headache is severe. Achiness all the joints. Chills intermittently. Measured a fever of 101. Cough occasionally productive.   Review of Systems  Constitutional: Positive for fever.  Respiratory: Positive for cough and wheezing.   Neurological: Positive for headaches.   no vomiting no diarrhea ROS otherwise negative     Objective:   Physical Exam Alert hydration good significant malaise. Afebrile currently HEENT normal intermittent deep cough heart regular in rhythm       Assessment & Plan:  Impression likely flu with secondary bronchitis started 3 days ago. Tamiflu will no longer help. Plan antibiotics prescribed. Albuterol when necessary. Warning signs discussed. WSL

## 2013-06-30 DIAGNOSIS — I10 Essential (primary) hypertension: Secondary | ICD-10-CM | POA: Diagnosis not present

## 2013-06-30 DIAGNOSIS — F17200 Nicotine dependence, unspecified, uncomplicated: Secondary | ICD-10-CM | POA: Diagnosis not present

## 2013-06-30 DIAGNOSIS — M25569 Pain in unspecified knee: Secondary | ICD-10-CM | POA: Diagnosis not present

## 2013-07-02 ENCOUNTER — Ambulatory Visit (INDEPENDENT_AMBULATORY_CARE_PROVIDER_SITE_OTHER): Payer: Medicare Other | Admitting: Family Medicine

## 2013-07-02 ENCOUNTER — Encounter: Payer: Self-pay | Admitting: Family Medicine

## 2013-07-02 VITALS — BP 138/88 | Ht 67.0 in | Wt 286.0 lb

## 2013-07-02 DIAGNOSIS — E119 Type 2 diabetes mellitus without complications: Secondary | ICD-10-CM | POA: Diagnosis not present

## 2013-07-02 DIAGNOSIS — J449 Chronic obstructive pulmonary disease, unspecified: Secondary | ICD-10-CM

## 2013-07-02 DIAGNOSIS — I10 Essential (primary) hypertension: Secondary | ICD-10-CM | POA: Diagnosis not present

## 2013-07-02 LAB — POCT GLYCOSYLATED HEMOGLOBIN (HGB A1C): Hemoglobin A1C: 5.4

## 2013-07-02 MED ORDER — MORPHINE SULFATE ER 30 MG PO TBCR: 30.0000 mg | EXTENDED_RELEASE_TABLET | Freq: Two times a day (BID) | ORAL | Status: DC

## 2013-07-02 MED ORDER — HYDROCODONE-ACETAMINOPHEN 10-325 MG PO TABS: 1.0000 | ORAL_TABLET | ORAL | Status: DC | PRN

## 2013-07-02 MED ORDER — INSULIN GLARGINE 100 UNIT/ML ~~LOC~~ SOLN: 48.0000 [IU] | Freq: Two times a day (BID) | SUBCUTANEOUS | Status: DC

## 2013-07-02 NOTE — Progress Notes (Signed)
   Subjective:    Patient ID: Shaun Harris, male    DOB: 1966-01-21, 47 y.o.   MRN: [id number]  HPI Patient arrives for a follow up on diabetes and chronic pain. Results for orders placed in visit on 07/02/13  POCT GLYCOSYLATED HEMOGLOBIN (HGB A1C)      Result Value Range   Hemoglobin A1C 5.4    glicoses low but not hypoglyucemic  On steroids so glu up this wk 54 units daily, running out a few days early, runs out  Not eating as much  Less income currently. Not exercising at this point.  Claims compliance with blood pressure medicine. Trying to watch salt intake. No obvious side effects from the medicine.  Clinically improved from flu, though still very fatigued. Still significant cough. Wheezing has improved somewhat. Using the nebulizer regularly. Compliant with medications.  Chronic pain ongoing challenge. Permanently disabling to patient. Severe particularly in the back. Also severe with chronic headaches.     Review of Systems No chest pain no abdominal pain no change in bowel habits no rash no blood in stool ROS otherwise negative    Objective:   Physical Exam Alert HEENT moderate nasal congestion. Moderate malaise. Vital stable. Blood pressure improved on repeat. Lungs minimal wheezes no tachypnea no crackles heart regular rate and rhythm. Ankles without edema feet sensation intact pulses good.       Assessment & Plan:  Impression 1 type 2 diabetes control tight in fact. Discussed. #2 chronic pain ongoing discussed. #3 flu with secondary bronchitis clinically improved. #4 hypertension good control. #5 hyperlipidemia plan maintain all medications. Pain medicine refilled. Decrease Lantus to 48 units twice a day rationale discussed. Recheck in several months. WSL

## 2013-07-02 NOTE — Patient Instructions (Signed)
Decrease lantus to 48 twice per d

## 2013-07-19 ENCOUNTER — Ambulatory Visit (INDEPENDENT_AMBULATORY_CARE_PROVIDER_SITE_OTHER): Payer: Medicare Other | Admitting: Family Medicine

## 2013-07-19 ENCOUNTER — Encounter: Payer: Self-pay | Admitting: Family Medicine

## 2013-07-19 VITALS — BP 132/86 | Temp 98.5°F | Ht 67.0 in | Wt 288.4 lb

## 2013-07-19 DIAGNOSIS — J45901 Unspecified asthma with (acute) exacerbation: Secondary | ICD-10-CM | POA: Diagnosis not present

## 2013-07-19 MED ORDER — PREDNISONE 20 MG PO TABS: ORAL_TABLET | ORAL | Status: DC

## 2013-07-19 MED ORDER — AMOXICILLIN-POT CLAVULANATE ER 1000-62.5 MG PO TB12: ORAL_TABLET | ORAL | Status: DC

## 2013-07-19 NOTE — Progress Notes (Signed)
   Subjective:    Patient ID: Shaun Harris, male    DOB: [birth year], 48 y.o.   MRN: [id number]  Cough This is a new problem. The current episode started 1 to 4 weeks ago. The problem has been unchanged. The problem occurs constantly. The cough is non-productive. Associated symptoms include a fever, headaches, myalgias and wheezing. Nothing aggravates the symptoms. Treatments tried: breathing treatments, antibiotic.   Patient some wheezing intermittently.  Cough is productive. Also still having low-grade fevers at times.  Diminished energy   Review of Systems  Constitutional: Positive for fever.  Respiratory: Positive for cough and wheezing.   Musculoskeletal: Positive for myalgias.  Neurological: Positive for headaches.       Objective:   Physical Exam Moderate malaise. Vital stable. No apparent distress. Lungs bilateral wheezes. Heart regular rate and rhythm.       Assessment & Plan:  Impression persistent bronchitis and reactive airways post flu plan Augmentin XR 1000 mg 2 tabs twice a day. Prednisone taper. Decrease Lantus secondary to spells of low sugar. Followup regular appointment. Warning signs discussed.

## 2013-08-26 ENCOUNTER — Telehealth: Payer: Self-pay | Admitting: Family Medicine

## 2013-08-26 ENCOUNTER — Other Ambulatory Visit: Payer: Self-pay | Admitting: Family Medicine

## 2013-08-26 MED ORDER — TIZANIDINE HCL 4 MG PO TABS: 4.0000 mg | ORAL_TABLET | Freq: Three times a day (TID) | ORAL | Status: DC | PRN

## 2013-08-26 NOTE — Telephone Encounter (Signed)
Letter received from [place] for pt's CHLORZOXAZONE 500mg  tab, not on the formulary.  Do you want to change to a med on the formulary or have me try to get covered?  Please advise Please see paper chart (formulary list too)

## 2013-08-26 NOTE — Telephone Encounter (Signed)
Rx sent electronically to pharmacy. Patient notified. 

## 2013-08-26 NOTE — Telephone Encounter (Signed)
zanaflex 4 mg 90 one tid prn five ref

## 2013-09-29 DIAGNOSIS — I10 Essential (primary) hypertension: Secondary | ICD-10-CM | POA: Diagnosis not present

## 2013-09-29 DIAGNOSIS — F17200 Nicotine dependence, unspecified, uncomplicated: Secondary | ICD-10-CM | POA: Diagnosis not present

## 2013-09-29 DIAGNOSIS — M25569 Pain in unspecified knee: Secondary | ICD-10-CM | POA: Diagnosis not present

## 2013-09-30 ENCOUNTER — Ambulatory Visit (INDEPENDENT_AMBULATORY_CARE_PROVIDER_SITE_OTHER): Payer: Medicare Other | Admitting: Family Medicine

## 2013-09-30 ENCOUNTER — Encounter: Payer: Self-pay | Admitting: Family Medicine

## 2013-09-30 VITALS — BP 132/90 | Ht 68.0 in | Wt 285.0 lb

## 2013-09-30 DIAGNOSIS — J449 Chronic obstructive pulmonary disease, unspecified: Secondary | ICD-10-CM

## 2013-09-30 DIAGNOSIS — I83009 Varicose veins of unspecified lower extremity with ulcer of unspecified site: Secondary | ICD-10-CM

## 2013-09-30 DIAGNOSIS — J4489 Other specified chronic obstructive pulmonary disease: Secondary | ICD-10-CM

## 2013-09-30 DIAGNOSIS — E785 Hyperlipidemia, unspecified: Secondary | ICD-10-CM

## 2013-09-30 DIAGNOSIS — G894 Chronic pain syndrome: Secondary | ICD-10-CM

## 2013-09-30 DIAGNOSIS — E119 Type 2 diabetes mellitus without complications: Secondary | ICD-10-CM

## 2013-09-30 DIAGNOSIS — I10 Essential (primary) hypertension: Secondary | ICD-10-CM | POA: Diagnosis not present

## 2013-09-30 DIAGNOSIS — Z79899 Other long term (current) drug therapy: Secondary | ICD-10-CM | POA: Diagnosis not present

## 2013-09-30 DIAGNOSIS — L97909 Non-pressure chronic ulcer of unspecified part of unspecified lower leg with unspecified severity: Secondary | ICD-10-CM

## 2013-09-30 LAB — LIPID PANEL
Cholesterol: 113 mg/dL (ref 0–200)
HDL: 37 mg/dL — ABNORMAL LOW (ref >39)
LDL Cholesterol: 64 mg/dL (ref 0–99)
Total CHOL/HDL Ratio: 3.1 Ratio
Triglycerides: 62 mg/dL (ref <150)
VLDL: 12 mg/dL (ref 0–40)

## 2013-09-30 LAB — HEPATIC FUNCTION PANEL
ALT: 18 U/L (ref 0–53)
AST: 16 U/L (ref 0–37)
Albumin: 4.5 g/dL (ref 3.5–5.2)
Alkaline Phosphatase: 81 U/L (ref 39–117)
Bilirubin, Direct: 0.1 mg/dL (ref 0.0–0.3)
Indirect Bilirubin: 0.4 mg/dL (ref 0.2–1.2)
Total Bilirubin: 0.5 mg/dL (ref 0.2–1.2)
Total Protein: 6.6 g/dL (ref 6.0–8.3)

## 2013-09-30 LAB — POCT GLYCOSYLATED HEMOGLOBIN (HGB A1C): Hemoglobin A1C: 5.5

## 2013-09-30 MED ORDER — HYDROCODONE-ACETAMINOPHEN 10-325 MG PO TABS: 1.0000 | ORAL_TABLET | ORAL | Status: DC | PRN

## 2013-09-30 MED ORDER — MORPHINE SULFATE ER 30 MG PO TBCR: 30.0000 mg | EXTENDED_RELEASE_TABLET | Freq: Two times a day (BID) | ORAL | Status: DC

## 2013-09-30 MED ORDER — MORPHINE SULFATE ER 30 MG PO TBCR
30.0000 mg | EXTENDED_RELEASE_TABLET | Freq: Two times a day (BID) | ORAL | Status: DC
Start: 2013-09-30 — End: 2013-12-29

## 2013-09-30 MED ORDER — AZITHROMYCIN 250 MG PO TABS: ORAL_TABLET | ORAL | Status: DC

## 2013-09-30 MED ORDER — INSULIN GLARGINE 100 UNIT/ML ~~LOC~~ SOLN: SUBCUTANEOUS | Status: DC

## 2013-09-30 NOTE — Progress Notes (Signed)
   Subjective:    Patient ID: Shaun Harris, male    DOB: [birth year], 48 y.o.   MRN: [id number]  Diabetes He presents for his follow-up diabetic visit. He has type 2 diabetes mellitus. (100 - 150) He does not see a podiatrist.Eye exam is not current.   Sore throat for the past 4 -5 days. Cough intermittently productive at times. No fever. Next  Claims compliance with blood pressure medicine. No obvious side effects. Watching salt intake.  Asthma overall has been stable even with recent infection.  Ongoing chronic severe pain. Please see prior notes. Disabled because of this. Time for refills.   Review of Systems Chronic headaches, no chest pain no back pain no abdominal pain no change in bowel habits no blood in stool ROS otherwise negative.    Objective:   Physical Exam  Alert no acute distress. HEENT normal. Lungs clear. Heart regular in rhythm.  Results for orders placed in visit on 09/30/13  LIPID PANEL      Result Value Ref Range   Cholesterol 113  0 - 200 mg/dL   Triglycerides 62  <150 mg/dL   HDL 37 (*) >39 mg/dL   Total CHOL/HDL Ratio 3.1     VLDL 12  0 - 40 mg/dL   LDL Cholesterol 64  0 - 99 mg/dL  HEPATIC FUNCTION PANEL      Result Value Ref Range   Total Bilirubin 0.5  0.2 - 1.2 mg/dL   Bilirubin, Direct 0.1  0.0 - 0.3 mg/dL   Indirect Bilirubin 0.4  0.2 - 1.2 mg/dL   Alkaline Phosphatase 81  39 - 117 U/L   AST 16  0 - 37 U/L   ALT 18  0 - 53 U/L   Total Protein 6.6  6.0 - 8.3 g/dL   Albumin 4.5  3.5 - 5.2 g/dL  POCT GLYCOSYLATED HEMOGLOBIN (HGB A1C)      Result Value Ref Range   Hemoglobin A1C 5.5         Assessment & Plan:  Impression 1 type 2 diabetes clinically stable. #2 chronic pain discussed. #3 rhinosinusitis. #4 hypertension good control. #5 asthma stable. #6 hyperlipidemia ongoing. Plan diet exercise discussed. Maintain same medications. Recheck in several months. Encouraged to stop smoking. WSL

## 2013-10-08 ENCOUNTER — Ambulatory Visit: Payer: Medicare Other | Admitting: Family Medicine

## 2013-10-11 ENCOUNTER — Telehealth: Payer: Self-pay | Admitting: Family Medicine

## 2013-10-11 ENCOUNTER — Other Ambulatory Visit: Payer: Self-pay | Admitting: *Deleted

## 2013-10-11 MED ORDER — ALBUTEROL SULFATE HFA 108 (90 BASE) MCG/ACT IN AERS: 2.0000 | INHALATION_SPRAY | Freq: Four times a day (QID) | RESPIRATORY_TRACT | Status: DC | PRN

## 2013-10-11 NOTE — Telephone Encounter (Signed)
Pt was taking 38 units BID but was dropped to 30 units BID at last visit on 03/19. States his blood sugars are between 200 - 290 now.  Please advise.

## 2013-10-11 NOTE — Telephone Encounter (Signed)
Go back up to 36 bid

## 2013-10-11 NOTE — Telephone Encounter (Signed)
Discussed with patient to go to 36 units BID.

## 2013-10-11 NOTE — Telephone Encounter (Signed)
Ever since patients lantus has been dropped to 30 units, his sugar has been staying up past 200. Please advise.

## 2013-10-12 NOTE — Progress Notes (Signed)
Patient notified and verbalized understanding of the test results. No further questions. 

## 2013-10-29 ENCOUNTER — Telehealth: Payer: Self-pay | Admitting: Family Medicine

## 2013-10-29 MED ORDER — ALPRAZOLAM 1 MG PO TABS: 1.0000 mg | ORAL_TABLET | Freq: Two times a day (BID) | ORAL | Status: DC | PRN

## 2013-10-29 NOTE — Telephone Encounter (Signed)
Done

## 2013-10-29 NOTE — Telephone Encounter (Signed)
Ok if time plus five monthly ref 

## 2013-10-29 NOTE — Telephone Encounter (Signed)
Patient needs Rx for alprazolam.

## 2013-11-02 ENCOUNTER — Telehealth: Payer: Self-pay | Admitting: Family Medicine

## 2013-11-02 NOTE — Telephone Encounter (Signed)
See attached to chart a Tax Disability form to be filled out by PCP   Call pt when done

## 2013-11-04 NOTE — Telephone Encounter (Signed)
Will do!

## 2013-11-05 ENCOUNTER — Telehealth: Payer: Self-pay | Admitting: Family Medicine

## 2013-11-05 MED ORDER — MESALAMINE ER 0.375 G PO CP24: 375.0000 mg | ORAL_CAPSULE | Freq: Four times a day (QID) | ORAL | Status: DC

## 2013-11-05 NOTE — Telephone Encounter (Signed)
Pt.notified

## 2013-11-05 NOTE — Telephone Encounter (Signed)
Patient needs Rx for apriso ER .375 gram caps

## 2013-11-05 NOTE — Telephone Encounter (Signed)
Medication sent to pharmacy  

## 2013-11-05 NOTE — Telephone Encounter (Signed)
Last seen 10/12/13

## 2013-11-05 NOTE — Telephone Encounter (Signed)
May refill x 5 

## 2013-12-15 ENCOUNTER — Other Ambulatory Visit: Payer: Self-pay | Admitting: Family Medicine

## 2013-12-29 ENCOUNTER — Ambulatory Visit (INDEPENDENT_AMBULATORY_CARE_PROVIDER_SITE_OTHER): Payer: Medicare Other | Admitting: Family Medicine

## 2013-12-29 ENCOUNTER — Encounter: Payer: Self-pay | Admitting: Family Medicine

## 2013-12-29 VITALS — BP 134/84 | Ht 66.0 in | Wt 275.0 lb

## 2013-12-29 DIAGNOSIS — E119 Type 2 diabetes mellitus without complications: Secondary | ICD-10-CM

## 2013-12-29 DIAGNOSIS — J449 Chronic obstructive pulmonary disease, unspecified: Secondary | ICD-10-CM

## 2013-12-29 DIAGNOSIS — I10 Essential (primary) hypertension: Secondary | ICD-10-CM | POA: Diagnosis not present

## 2013-12-29 DIAGNOSIS — M25569 Pain in unspecified knee: Secondary | ICD-10-CM | POA: Diagnosis not present

## 2013-12-29 DIAGNOSIS — F17200 Nicotine dependence, unspecified, uncomplicated: Secondary | ICD-10-CM | POA: Diagnosis not present

## 2013-12-29 LAB — POCT GLYCOSYLATED HEMOGLOBIN (HGB A1C): Hemoglobin A1C: 6.6

## 2013-12-29 MED ORDER — MORPHINE SULFATE ER 30 MG PO TBCR: 30.0000 mg | EXTENDED_RELEASE_TABLET | Freq: Two times a day (BID) | ORAL | Status: DC

## 2013-12-29 MED ORDER — HYDROCODONE-ACETAMINOPHEN 10-325 MG PO TABS: 1.0000 | ORAL_TABLET | ORAL | Status: DC | PRN

## 2013-12-29 NOTE — Progress Notes (Signed)
   Subjective:    Patient ID: Shaun Harris, male    DOB: 07-Jun-1966, 48 y.o.   MRN: [id number]  Diabetes He presents for his follow-up diabetic visit. He has type 2 diabetes mellitus. There are no hypoglycemic associated symptoms. There are no diabetic associated symptoms. Symptoms are stable. Current diabetic treatment includes insulin injections and oral agent (monotherapy). He is compliant with treatment all of the time. He monitors blood glucose at home 3-4 x per day. Blood glucose monitoring compliance is excellent. His overall blood glucose range is 90-110 mg/dl. He does not see a podiatrist.Eye exam is not current.    Trying to eat the right stuff,  Not exrcising  Breathing has been acting up some but not bad. Unfortunately still smoking. Patient claims it more one outside.  Compliant with blood pressure medication. Watching salt intake. No obvious side effects.  Ongoing chronic pain. Severe in nature. Both headache and back pain. States she definitely needs his pain medication.    Review of Systems No chest pain no fever no change about habits no blood in stool no rash ROS otherwise negative    Objective:   Physical Exam  Alert substantial obesity present. Blood pressure improved on repeat. HEENT normal. Lungs no wheezes no crackles heart regular in rhythm. C. diabetic foot exam      Assessment & Plan:  Impression 1 type 2 diabetes control good. #2 asthma clinically stable. With some self abusive via smoking. #3 chronic pain ongoing. #4 hypertension stable plan maintain same meds. Diet exercise discussed. Pain medicines written. Recheck in several months. WSL

## 2013-12-30 ENCOUNTER — Ambulatory Visit: Payer: Medicare Other | Admitting: Family Medicine

## 2014-01-14 ENCOUNTER — Other Ambulatory Visit: Payer: Self-pay | Admitting: Family Medicine

## 2014-01-28 ENCOUNTER — Other Ambulatory Visit: Payer: Self-pay | Admitting: Family Medicine

## 2014-01-28 NOTE — Telephone Encounter (Signed)
Last seen 12/29/13

## 2014-02-11 ENCOUNTER — Other Ambulatory Visit: Payer: Self-pay | Admitting: Family Medicine

## 2014-02-23 ENCOUNTER — Other Ambulatory Visit: Payer: Self-pay | Admitting: *Deleted

## 2014-02-23 ENCOUNTER — Other Ambulatory Visit: Payer: Self-pay | Admitting: Family Medicine

## 2014-02-23 MED ORDER — ATORVASTATIN CALCIUM 20 MG PO TABS: 20.0000 mg | ORAL_TABLET | Freq: Every day | ORAL | Status: DC

## 2014-03-24 ENCOUNTER — Other Ambulatory Visit: Payer: Self-pay | Admitting: *Deleted

## 2014-03-24 MED ORDER — FUROSEMIDE 20 MG PO TABS: 20.0000 mg | ORAL_TABLET | Freq: Every day | ORAL | Status: DC

## 2014-03-25 ENCOUNTER — Other Ambulatory Visit: Payer: Self-pay | Admitting: Family Medicine

## 2014-03-28 ENCOUNTER — Other Ambulatory Visit: Payer: Self-pay | Admitting: *Deleted

## 2014-03-28 MED ORDER — INSULIN GLARGINE 100 UNIT/ML ~~LOC~~ SOLN: 36.0000 [IU] | Freq: Two times a day (BID) | SUBCUTANEOUS | Status: DC

## 2014-03-30 ENCOUNTER — Ambulatory Visit (INDEPENDENT_AMBULATORY_CARE_PROVIDER_SITE_OTHER): Payer: Medicare Other | Admitting: Family Medicine

## 2014-03-30 ENCOUNTER — Other Ambulatory Visit: Payer: Self-pay | Admitting: Family Medicine

## 2014-03-30 ENCOUNTER — Encounter: Payer: Self-pay | Admitting: Family Medicine

## 2014-03-30 VITALS — BP 132/80 | Ht 66.0 in | Wt 272.0 lb

## 2014-03-30 DIAGNOSIS — E119 Type 2 diabetes mellitus without complications: Secondary | ICD-10-CM | POA: Diagnosis not present

## 2014-03-30 DIAGNOSIS — I10 Essential (primary) hypertension: Secondary | ICD-10-CM | POA: Diagnosis not present

## 2014-03-30 DIAGNOSIS — M25569 Pain in unspecified knee: Secondary | ICD-10-CM | POA: Diagnosis not present

## 2014-03-30 DIAGNOSIS — Z23 Encounter for immunization: Secondary | ICD-10-CM

## 2014-03-30 DIAGNOSIS — Z79899 Other long term (current) drug therapy: Secondary | ICD-10-CM | POA: Diagnosis not present

## 2014-03-30 DIAGNOSIS — F17200 Nicotine dependence, unspecified, uncomplicated: Secondary | ICD-10-CM | POA: Diagnosis not present

## 2014-03-30 DIAGNOSIS — E782 Mixed hyperlipidemia: Secondary | ICD-10-CM | POA: Diagnosis not present

## 2014-03-30 LAB — POCT GLYCOSYLATED HEMOGLOBIN (HGB A1C): Hemoglobin A1C: 6.1

## 2014-03-30 MED ORDER — HYDROCODONE-ACETAMINOPHEN 10-325 MG PO TABS: 1.0000 | ORAL_TABLET | ORAL | Status: DC | PRN

## 2014-03-30 MED ORDER — MORPHINE SULFATE ER 30 MG PO TBCR: 30.0000 mg | EXTENDED_RELEASE_TABLET | Freq: Two times a day (BID) | ORAL | Status: DC

## 2014-03-30 NOTE — Progress Notes (Signed)
   Subjective:    Patient ID: Shaun Harris, male    DOB: [birth year], 47 y.o.   MRN: [id number]  Diabetes He presents for his follow-up diabetic visit. He has type 2 diabetes mellitus. His disease course has been stable. There are no diabetic complications. He is compliant with treatment all of the time.   Patient reports his morning sugars are generally in good control. Compliant with lipid medication. No obvious side effects. Current status uncertain.  Compliant with blood pressure medicine. No obvious side effects. Watching salt intake. Blood pressure good when checked elsewhere.  Ongoing chronic pain. States definitely needs pain medicine and alert adequately control his discomfort.  Results for orders placed in visit on 03/30/14  POCT GLYCOSYLATED HEMOGLOBIN (HGB A1C)      Result Value Ref Range   Hemoglobin A1C 6.1      A1C 6.1    Review of Systems No abdominal pain no change about habits no blood in stools no rash ROS otherwise negative    Objective:   Physical Exam Alert vital stable. HEENT normal lungs clear heart regular in rhythm substantial obesity present ankles venous stasis changes noted.       Assessment & Plan:  Impression #1 type 2 diabetes good control. #2 hypertension good control. #3 hyperlipidemia status uncertain. #4 chronic pain ongoing discussed. Plan meds refilled. Diet exercise discussed. Flu shot given. Appropriate blood work. Further recommendations based results. Recheck in 3 months. WSL

## 2014-03-31 LAB — HEPATIC FUNCTION PANEL
ALT: 21 U/L (ref 0–53)
AST: 18 U/L (ref 0–37)
Albumin: 4.4 g/dL (ref 3.5–5.2)
Alkaline Phosphatase: 86 U/L (ref 39–117)
Bilirubin, Direct: 0.1 mg/dL (ref 0.0–0.3)
Indirect Bilirubin: 0.3 mg/dL (ref 0.2–1.2)
Total Bilirubin: 0.4 mg/dL (ref 0.2–1.2)
Total Protein: 6.8 g/dL (ref 6.0–8.3)

## 2014-03-31 LAB — LIPID PANEL
Cholesterol: 118 mg/dL (ref 0–200)
HDL: 33 mg/dL — ABNORMAL LOW (ref >39)
LDL Cholesterol: 67 mg/dL (ref 0–99)
Total CHOL/HDL Ratio: 3.6 Ratio
Triglycerides: 90 mg/dL (ref <150)
VLDL: 18 mg/dL (ref 0–40)

## 2014-04-03 ENCOUNTER — Encounter: Payer: Self-pay | Admitting: Family Medicine

## 2014-04-29 ENCOUNTER — Other Ambulatory Visit: Payer: Self-pay | Admitting: Family Medicine

## 2014-04-29 MED ORDER — ALPRAZOLAM 1 MG PO TABS: 1.0000 mg | ORAL_TABLET | Freq: Two times a day (BID) | ORAL | Status: DC | PRN

## 2014-04-29 NOTE — Telephone Encounter (Signed)
Patient would like a refill on:   ALPRAZolam (XANAX) 1 MG tablet   # 60 tablet   Take 1 tablet (1 mg total) by mouth 2 (two) times daily as needed.   Last seen 9/16  Last filled: 4/17 with 5 refills

## 2014-04-29 NOTE — Telephone Encounter (Signed)
Ok plus five ref 

## 2014-05-25 ENCOUNTER — Other Ambulatory Visit: Payer: Self-pay | Admitting: Family Medicine

## 2014-05-30 ENCOUNTER — Other Ambulatory Visit: Payer: Self-pay | Admitting: Family Medicine

## 2014-06-28 ENCOUNTER — Encounter: Payer: Self-pay | Admitting: Family Medicine

## 2014-06-28 ENCOUNTER — Other Ambulatory Visit: Payer: Self-pay | Admitting: Family Medicine

## 2014-06-28 ENCOUNTER — Ambulatory Visit (INDEPENDENT_AMBULATORY_CARE_PROVIDER_SITE_OTHER): Payer: Medicare Other | Admitting: Family Medicine

## 2014-06-28 VITALS — BP 138/94 | Ht 67.0 in | Wt 277.0 lb

## 2014-06-28 DIAGNOSIS — E119 Type 2 diabetes mellitus without complications: Secondary | ICD-10-CM

## 2014-06-28 DIAGNOSIS — G894 Chronic pain syndrome: Secondary | ICD-10-CM | POA: Diagnosis not present

## 2014-06-28 DIAGNOSIS — I10 Essential (primary) hypertension: Secondary | ICD-10-CM

## 2014-06-28 LAB — POCT GLYCOSYLATED HEMOGLOBIN (HGB A1C): Hemoglobin A1C: 6.4

## 2014-06-28 MED ORDER — HYDROCODONE-ACETAMINOPHEN 10-325 MG PO TABS: 1.0000 | ORAL_TABLET | ORAL | Status: DC | PRN

## 2014-06-28 MED ORDER — MORPHINE SULFATE ER 30 MG PO TBCR: 30.0000 mg | EXTENDED_RELEASE_TABLET | Freq: Two times a day (BID) | ORAL | Status: DC

## 2014-06-28 NOTE — Progress Notes (Signed)
   Subjective:    Patient ID: Shaun Harris, male    DOB: 11-Jun-1966, 48 y.o.   MRN: [id number]  Diabetes He presents for his follow-up diabetic visit. He has type 2 diabetes mellitus. He is compliant with treatment all of the time. His breakfast blood glucose range is generally 90-110 mg/dl. He does not see a podiatrist.Eye exam is not current.   Needs refill on hydrocodone and morphine. Takes as prescribed. States definitely needs the pain medicine to maintain current function. Having ongoing knee difficulties. Sees Dr. Luna Glasgow for this. [id number]  Results for orders placed or performed in visit on 06/28/14  POCT glycosylated hemoglobin (Hb A1C)  Result Value Ref Range   Hemoglobin A1C 6.4    Compliant with blood pressure medication. No obvious side effects. Watching salt intake.  Asthma overall stable. Unfortunately still smoking. Some exercise but not a lot. Review of Systems No shortness of breath except with exertion no chest pain no nausea no diaphoresis no joint pain some headache ongoing despite meds    Objective:   Physical Exam  Alert no apparent distress. HEENT normal. Vitals reviewed. Lungs clear. Heart regular in rhythm. No wheezes currently ankles without edema.      Assessment & Plan:  Impression 1 type 2 diabetes good control. #2 hypertension good control. #3 asthma clinically stable #4 chronic pain ongoing discussed plan maintain same medications. Diet exercise discussed. Recheck in several months. WSL

## 2014-06-29 ENCOUNTER — Ambulatory Visit: Payer: Medicare Other | Admitting: Family Medicine

## 2014-06-29 ENCOUNTER — Telehealth: Payer: Self-pay | Admitting: Family Medicine

## 2014-06-29 DIAGNOSIS — M25561 Pain in right knee: Secondary | ICD-10-CM | POA: Diagnosis not present

## 2014-06-29 DIAGNOSIS — M25562 Pain in left knee: Secondary | ICD-10-CM | POA: Diagnosis not present

## 2014-06-29 DIAGNOSIS — I10 Essential (primary) hypertension: Secondary | ICD-10-CM | POA: Diagnosis not present

## 2014-06-29 DIAGNOSIS — F17200 Nicotine dependence, unspecified, uncomplicated: Secondary | ICD-10-CM | POA: Diagnosis not present

## 2014-06-29 NOTE — Telephone Encounter (Signed)
Pt was getting his insulin needles through a diabetic supply company but they  No longer carry them  She needs to have a supply of the long needles (she says 29 gauge) sent to  [facility]   She states the short needles if used will just shoot back out (?)   She said questions give her a call

## 2014-06-29 NOTE — Telephone Encounter (Signed)
ntsw and please adjust order as needed

## 2014-06-30 NOTE — Telephone Encounter (Signed)
Script faxed to pharmacy. Amy was notified.

## 2014-07-28 ENCOUNTER — Other Ambulatory Visit: Payer: Self-pay | Admitting: Family Medicine

## 2014-08-23 ENCOUNTER — Other Ambulatory Visit: Payer: Self-pay | Admitting: Family Medicine

## 2014-08-25 ENCOUNTER — Telehealth: Payer: Self-pay | Admitting: Family Medicine

## 2014-08-25 NOTE — Telephone Encounter (Signed)
error 

## 2014-09-12 ENCOUNTER — Other Ambulatory Visit: Payer: Self-pay | Admitting: Family Medicine

## 2014-09-20 ENCOUNTER — Ambulatory Visit (INDEPENDENT_AMBULATORY_CARE_PROVIDER_SITE_OTHER): Payer: Medicare Other | Admitting: Family Medicine

## 2014-09-20 ENCOUNTER — Encounter: Payer: Self-pay | Admitting: Family Medicine

## 2014-09-20 VITALS — BP 142/90 | Ht 67.0 in | Wt 283.0 lb

## 2014-09-20 DIAGNOSIS — Z79899 Other long term (current) drug therapy: Secondary | ICD-10-CM | POA: Diagnosis not present

## 2014-09-20 DIAGNOSIS — E119 Type 2 diabetes mellitus without complications: Secondary | ICD-10-CM

## 2014-09-20 DIAGNOSIS — Z13220 Encounter for screening for lipoid disorders: Secondary | ICD-10-CM | POA: Diagnosis not present

## 2014-09-20 DIAGNOSIS — G894 Chronic pain syndrome: Secondary | ICD-10-CM | POA: Diagnosis not present

## 2014-09-20 DIAGNOSIS — I10 Essential (primary) hypertension: Secondary | ICD-10-CM

## 2014-09-20 MED ORDER — MORPHINE SULFATE ER 30 MG PO TBCR: 30.0000 mg | EXTENDED_RELEASE_TABLET | Freq: Two times a day (BID) | ORAL | Status: DC

## 2014-09-20 MED ORDER — HYDROCODONE-ACETAMINOPHEN 10-325 MG PO TABS: 1.0000 | ORAL_TABLET | ORAL | Status: DC | PRN

## 2014-09-20 NOTE — Progress Notes (Signed)
   Subjective:    Patient ID: LARWENCE TU, male    DOB: [birth year], 49 y.o.   MRN: [id number]  HPI This patient was seen today for chronic pain  The medication list was reviewed and updated.   -Compliance with pain medication: yes  The patient was advised the importance of maintaining medication and not using illegal substances with these.  Refills needed: hydrocodone and morphine  The patient was educated that we can provide 3 monthly scripts for their medication, it is their responsibility to follow the instructions.  Side effects or complications from medications: none  Patient is aware that pain medications are meant to minimize the severity of the pain to allow their pain levels to improve to allow for better function. They are aware of that pain medications cannot totally remove their pain.  Due for UDT ( at least once per year) :   Diabetes. Last A1C was 6.4 on 12/15.    Pt states no concerns today.    Review of Systems Ongoing chronic headaches no chest pain no abdominal pain. Some wheezing some swelling and ankle slight    Objective:   Physical Exam  Alert no acute distress vital stable. Blood pressure good on repeat HEENT slight nasal congestion. Lungs clear. Heart regular in rhythm. Ankles currently without edema      Assessment & Plan:  Impression 1 type 2 diabetes good control #2 chronic pain discussed definitely needs meds to function #3 hypertension good control #4 asthma clinically stable plan appropriate blood work. Encouraged to see eye doctor. Diet exercise discussed. Medications refilled. Recheck in several months. WSL

## 2014-09-21 LAB — BASIC METABOLIC PANEL
BUN/Creatinine Ratio: 18 (ref 9–20)
BUN: 12 mg/dL (ref 6–24)
CO2: 26 mmol/L (ref 18–29)
Calcium: 9.3 mg/dL (ref 8.7–10.2)
Chloride: 100 mmol/L (ref 97–108)
Creatinine, Ser: 0.65 mg/dL — ABNORMAL LOW (ref 0.76–1.27)
GFR calc Af Amer: 133 mL/min/{1.73_m2} (ref >59)
GFR calc non Af Amer: 115 mL/min/{1.73_m2} (ref >59)
Glucose: 128 mg/dL — ABNORMAL HIGH (ref 65–99)
Potassium: 4.6 mmol/L (ref 3.5–5.2)
Sodium: 143 mmol/L (ref 134–144)

## 2014-09-21 LAB — HEPATIC FUNCTION PANEL
ALT: 28 IU/L (ref 0–44)
AST: 20 IU/L (ref 0–40)
Albumin: 4.5 g/dL (ref 3.5–5.5)
Alkaline Phosphatase: 94 IU/L (ref 39–117)
Bilirubin Total: 0.3 mg/dL (ref 0.0–1.2)
Bilirubin, Direct: 0.08 mg/dL (ref 0.00–0.40)
Total Protein: 6.9 g/dL (ref 6.0–8.5)

## 2014-09-21 LAB — LIPID PANEL
Chol/HDL Ratio: 3.4 ratio units (ref 0.0–5.0)
Cholesterol, Total: 141 mg/dL (ref 100–199)
HDL: 42 mg/dL (ref >39)
LDL Calculated: 83 mg/dL (ref 0–99)
Triglycerides: 82 mg/dL (ref 0–149)
VLDL Cholesterol Cal: 16 mg/dL (ref 5–40)

## 2014-09-21 LAB — MICROALBUMIN, URINE: Microalbumin, Urine: 20.2 ug/mL — ABNORMAL HIGH (ref 0.0–17.0)

## 2014-09-23 ENCOUNTER — Other Ambulatory Visit: Payer: Self-pay | Admitting: *Deleted

## 2014-09-23 MED ORDER — FUROSEMIDE 20 MG PO TABS: 20.0000 mg | ORAL_TABLET | Freq: Every day | ORAL | Status: DC

## 2014-09-23 MED ORDER — ALBUTEROL SULFATE HFA 108 (90 BASE) MCG/ACT IN AERS: INHALATION_SPRAY | RESPIRATORY_TRACT | Status: DC

## 2014-09-28 DIAGNOSIS — J449 Chronic obstructive pulmonary disease, unspecified: Secondary | ICD-10-CM | POA: Diagnosis not present

## 2014-09-28 DIAGNOSIS — I10 Essential (primary) hypertension: Secondary | ICD-10-CM | POA: Diagnosis not present

## 2014-09-28 DIAGNOSIS — M25561 Pain in right knee: Secondary | ICD-10-CM | POA: Diagnosis not present

## 2014-09-28 DIAGNOSIS — F17200 Nicotine dependence, unspecified, uncomplicated: Secondary | ICD-10-CM | POA: Diagnosis not present

## 2014-09-28 DIAGNOSIS — M25562 Pain in left knee: Secondary | ICD-10-CM | POA: Diagnosis not present

## 2014-09-28 DIAGNOSIS — Z6841 Body Mass Index (BMI) 40.0 and over, adult: Secondary | ICD-10-CM | POA: Diagnosis not present

## 2014-09-28 DIAGNOSIS — E109 Type 1 diabetes mellitus without complications: Secondary | ICD-10-CM | POA: Diagnosis not present

## 2014-10-04 ENCOUNTER — Encounter: Payer: Self-pay | Admitting: Family Medicine

## 2014-10-25 ENCOUNTER — Other Ambulatory Visit: Payer: Self-pay | Admitting: *Deleted

## 2014-10-25 MED ORDER — ALPRAZOLAM 1 MG PO TABS: 1.0000 mg | ORAL_TABLET | Freq: Two times a day (BID) | ORAL | Status: DC | PRN

## 2014-10-25 NOTE — Telephone Encounter (Signed)
Ok plus five mo ref 

## 2014-11-24 ENCOUNTER — Other Ambulatory Visit: Payer: Self-pay | Admitting: Family Medicine

## 2014-11-30 DIAGNOSIS — M542 Cervicalgia: Secondary | ICD-10-CM | POA: Diagnosis not present

## 2014-11-30 DIAGNOSIS — M25569 Pain in unspecified knee: Secondary | ICD-10-CM | POA: Diagnosis not present

## 2014-12-05 ENCOUNTER — Telehealth: Payer: Self-pay | Admitting: Family Medicine

## 2014-12-05 NOTE — Telephone Encounter (Signed)
DONE

## 2014-12-05 NOTE — Telephone Encounter (Signed)
Pt is needing to be seen for knee pain, neck pain and nauseated due to a  [place], was [place] at an urgent care but  Needs to follow up with you. I offered them this Friday but she states she  Can not wait that long to be seen   Please advise

## 2014-12-06 ENCOUNTER — Encounter: Payer: Self-pay | Admitting: Family Medicine

## 2014-12-06 ENCOUNTER — Telehealth: Payer: Self-pay | Admitting: *Deleted

## 2014-12-06 ENCOUNTER — Ambulatory Visit (INDEPENDENT_AMBULATORY_CARE_PROVIDER_SITE_OTHER): Payer: Self-pay | Admitting: Family Medicine

## 2014-12-06 VITALS — BP 136/88 | Ht 67.0 in | Wt 287.0 lb

## 2014-12-06 DIAGNOSIS — S83209A Unspecified tear of unspecified meniscus, current injury, unspecified knee, initial encounter: Secondary | ICD-10-CM

## 2014-12-06 DIAGNOSIS — M25562 Pain in left knee: Secondary | ICD-10-CM
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
## 2014-12-06 NOTE — Telephone Encounter (Signed)
Pt MRI scheduled [facility] June 2nd register 6:30 PM.

## 2014-12-06 NOTE — Telephone Encounter (Signed)
Notified Amy MRI scheduled [facility] June 2nd register 6:30 PM.

## 2014-12-06 NOTE — Telephone Encounter (Signed)
LMRC

## 2014-12-13 ENCOUNTER — Ambulatory Visit (HOSPITAL_COMMUNITY)
Admission: RE | Admit: 2014-12-13 | Discharge: 2014-12-13 | Disposition: A | Discharge status: Home / Self Care | Payer: No Typology Code available for payment source | Source: Ambulatory Visit | Attending: Family Medicine | Admitting: Family Medicine

## 2014-12-13 DIAGNOSIS — M1712 Unilateral primary osteoarthritis, left knee: Secondary | ICD-10-CM | POA: Diagnosis not present

## 2014-12-13 DIAGNOSIS — S83422A Sprain of lateral collateral ligament of left knee, initial encounter: Secondary | ICD-10-CM | POA: Insufficient documentation

## 2014-12-13 DIAGNOSIS — M25462 Effusion, left knee: Secondary | ICD-10-CM | POA: Insufficient documentation

## 2014-12-13 DIAGNOSIS — M25562 Pain in left knee: Secondary | ICD-10-CM | POA: Diagnosis not present

## 2014-12-13 DIAGNOSIS — S83242A Other tear of medial meniscus, current injury, left knee, initial encounter: Secondary | ICD-10-CM | POA: Diagnosis not present

## 2014-12-15 ENCOUNTER — Ambulatory Visit (HOSPITAL_COMMUNITY): Payer: PRIVATE HEALTH INSURANCE

## 2014-12-15 NOTE — Addendum Note (Signed)
Addended by: Carmelina Noun on: 12/15/2014 08:11 AM   Modules accepted: Orders

## 2014-12-20 ENCOUNTER — Encounter: Payer: Self-pay | Admitting: Family Medicine

## 2014-12-20 ENCOUNTER — Ambulatory Visit (INDEPENDENT_AMBULATORY_CARE_PROVIDER_SITE_OTHER): Payer: Medicare Other | Admitting: Family Medicine

## 2014-12-20 ENCOUNTER — Telehealth: Payer: Self-pay | Admitting: *Deleted

## 2014-12-20 VITALS — Ht 67.0 in

## 2014-12-20 DIAGNOSIS — Z79899 Other long term (current) drug therapy: Secondary | ICD-10-CM | POA: Diagnosis not present

## 2014-12-20 DIAGNOSIS — E119 Type 2 diabetes mellitus without complications: Secondary | ICD-10-CM | POA: Diagnosis not present

## 2014-12-20 LAB — POCT GLYCOSYLATED HEMOGLOBIN (HGB A1C): Hemoglobin A1C: 6.1

## 2014-12-20 MED ORDER — HYDROCODONE-ACETAMINOPHEN 10-325 MG PO TABS: 1.0000 | ORAL_TABLET | ORAL | Status: DC | PRN

## 2014-12-20 MED ORDER — MORPHINE SULFATE ER 30 MG PO TBCR: 30.0000 mg | EXTENDED_RELEASE_TABLET | Freq: Two times a day (BID) | ORAL | Status: DC

## 2014-12-20 NOTE — Telephone Encounter (Signed)
Pt seen today and received script for hydrocodone. Pt then saw dr. Luna Glasgow and was given rx for percocet. Wife called and wanted to let Dr. Richardson Landry know that pt wants to fill percocet. He is having surgery on Friday.  Ok per Dr.Steve to fill percocet and cancel hydrocodone. The script for hydrocodone was turned into pharm. i called to cancel script for hydrocodone.

## 2014-12-20 NOTE — Progress Notes (Signed)
   Subjective:    Patient ID: Shaun Harris, male    DOB: 26-Dec-1965, 49 y.o.   MRN: [id number]  Diabetes He presents for his follow-up diabetic visit. He has type 2 diabetes mellitus. Risk factors for coronary artery disease include dyslipidemia, diabetes mellitus, hypertension and obesity. Current diabetic treatment includes oral agent (dual therapy). He is compliant with treatment all of the time. He is following a diabetic diet. He has not had a previous visit with a dietitian. He rarely participates in exercise. He does not see a podiatrist.Eye exam current: has appt monday 6/13.   Patient also here for pain management, patient has chronic joint pain and chronic back pain. Also chronic headache.  Also has new injury with left knee tear of medial meniscus  Sugars overall running good,  Numbers generally in the 100 range  Results for orders placed or performed in visit on 12/20/14  POCT glycosylated hemoglobin (Hb A1C)  Result Value Ref Range   Hemoglobin A1C 6.1     Compliant with blood pressure medication. Overall good control watching salt intake.  Asthma overall stable. Using Advair faithfully Review of Systems    no abdominal pain no change in bowel habits no rash no blood in stool Objective:   Physical Exam  Alert vitals stable blood pressure good H&T normal. Neck supple lungs clear. Heart regular in rhythm. Ankles without edema.      Assessment & Plan:  Impression 1 hypertension good control discussed #2 type 2 diabetes good control discussed #3 chronic pain discussed #4 asthma clinically stable plan obtain same medications. Patient to see orthopedist for knee injury later today. Diet exercise discussed. Recheck in several months. Pain medicines written. WSL

## 2014-12-21 ENCOUNTER — Other Ambulatory Visit: Payer: Self-pay | Admitting: Family Medicine

## 2014-12-21 ENCOUNTER — Encounter (HOSPITAL_COMMUNITY)
Admission: RE | Admit: 2014-12-21 | Discharge: 2014-12-21 | Disposition: A | Discharge status: Home / Self Care | Payer: No Typology Code available for payment source | Source: Ambulatory Visit | Attending: Orthopaedic Surgery | Admitting: Orthopaedic Surgery

## 2014-12-21 ENCOUNTER — Encounter (HOSPITAL_COMMUNITY): Payer: Self-pay

## 2014-12-21 ENCOUNTER — Other Ambulatory Visit: Payer: Self-pay | Admitting: Radiology

## 2014-12-21 DIAGNOSIS — Z8673 Personal history of transient ischemic attack (TIA), and cerebral infarction without residual deficits: Secondary | ICD-10-CM | POA: Diagnosis not present

## 2014-12-21 DIAGNOSIS — Z01818 Encounter for other preprocedural examination: Secondary | ICD-10-CM | POA: Insufficient documentation

## 2014-12-21 DIAGNOSIS — M170 Bilateral primary osteoarthritis of knee: Secondary | ICD-10-CM | POA: Diagnosis not present

## 2014-12-21 DIAGNOSIS — S83242A Other tear of medial meniscus, current injury, left knee, initial encounter: Secondary | ICD-10-CM | POA: Diagnosis not present

## 2014-12-21 DIAGNOSIS — E119 Type 2 diabetes mellitus without complications: Secondary | ICD-10-CM | POA: Diagnosis not present

## 2014-12-21 DIAGNOSIS — E781 Pure hyperglyceridemia: Secondary | ICD-10-CM | POA: Diagnosis not present

## 2014-12-21 DIAGNOSIS — G4730 Sleep apnea, unspecified: Secondary | ICD-10-CM | POA: Diagnosis not present

## 2014-12-21 DIAGNOSIS — G894 Chronic pain syndrome: Secondary | ICD-10-CM | POA: Diagnosis not present

## 2014-12-21 DIAGNOSIS — F17210 Nicotine dependence, cigarettes, uncomplicated: Secondary | ICD-10-CM | POA: Diagnosis not present

## 2014-12-21 DIAGNOSIS — E785 Hyperlipidemia, unspecified: Secondary | ICD-10-CM | POA: Diagnosis not present

## 2014-12-21 DIAGNOSIS — M160 Bilateral primary osteoarthritis of hip: Secondary | ICD-10-CM | POA: Diagnosis not present

## 2014-12-21 DIAGNOSIS — F419 Anxiety disorder, unspecified: Secondary | ICD-10-CM | POA: Diagnosis not present

## 2014-12-21 DIAGNOSIS — I10 Essential (primary) hypertension: Secondary | ICD-10-CM | POA: Diagnosis not present

## 2014-12-21 DIAGNOSIS — J449 Chronic obstructive pulmonary disease, unspecified: Secondary | ICD-10-CM | POA: Diagnosis not present

## 2014-12-21 HISTORY — DX: Unspecified osteoarthritis, unspecified site: M19.90

## 2014-12-21 HISTORY — DX: Cerebral infarction, unspecified: I63.9

## 2014-12-21 HISTORY — DX: Cerebral arteritis, not elsewhere classified: I67.7

## 2014-12-21 LAB — CBC WITH DIFFERENTIAL/PLATELET
Basophils Absolute: 0 10*3/uL (ref 0.0–0.1)
Basophils Relative: 0 % (ref 0–1)
Eosinophils Absolute: 0.1 10*3/uL (ref 0.0–0.7)
Eosinophils Relative: 2 % (ref 0–5)
HCT: 44.7 % (ref 39.0–52.0)
Hemoglobin: 15.1 g/dL (ref 13.0–17.0)
Lymphocytes Relative: 25 % (ref 12–46)
Lymphs Abs: 2 10*3/uL (ref 0.7–4.0)
MCH: 30.9 pg (ref 26.0–34.0)
MCHC: 33.8 g/dL (ref 30.0–36.0)
MCV: 91.4 fL (ref 78.0–100.0)
Monocytes Absolute: 0.6 10*3/uL (ref 0.1–1.0)
Monocytes Relative: 8 % (ref 3–12)
Neutro Abs: 5.2 10*3/uL (ref 1.7–7.7)
Neutrophils Relative %: 65 % (ref 43–77)
Platelets: 163 10*3/uL (ref 150–400)
RBC: 4.89 MIL/uL (ref 4.22–5.81)
RDW: 12.9 % (ref 11.5–15.5)
WBC: 8.1 10*3/uL (ref 4.0–10.5)

## 2014-12-21 LAB — COMPREHENSIVE METABOLIC PANEL
ALT: 31 U/L (ref 17–63)
AST: 27 U/L (ref 15–41)
Albumin: 4.3 g/dL (ref 3.5–5.0)
Alkaline Phosphatase: 78 U/L (ref 38–126)
Anion gap: 9 (ref 5–15)
BUN: 10 mg/dL (ref 6–20)
CO2: 31 mmol/L (ref 22–32)
Calcium: 9.3 mg/dL (ref 8.9–10.3)
Chloride: 104 mmol/L (ref 101–111)
Creatinine, Ser: 0.61 mg/dL (ref 0.61–1.24)
GFR calc Af Amer: >60 mL/min (ref >60)
GFR calc non Af Amer: >60 mL/min (ref >60)
Glucose, Bld: 54 mg/dL — ABNORMAL LOW (ref 65–99)
Potassium: 3.9 mmol/L (ref 3.5–5.1)
Sodium: 144 mmol/L (ref 135–145)
Total Bilirubin: 0.6 mg/dL (ref 0.3–1.2)
Total Protein: 7 g/dL (ref 6.5–8.1)

## 2014-12-21 LAB — URINALYSIS, ROUTINE W REFLEX MICROSCOPIC
Bilirubin Urine: NEGATIVE
Glucose, UA: NEGATIVE mg/dL
Hgb urine dipstick: NEGATIVE
Ketones, ur: NEGATIVE mg/dL
Leukocytes, UA: NEGATIVE
Nitrite: NEGATIVE
Protein, ur: NEGATIVE mg/dL
Specific Gravity, Urine: 1.02 (ref 1.005–1.030)
Urobilinogen, UA: 0.2 mg/dL (ref 0.0–1.0)
pH: 5.5 (ref 5.0–8.0)
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
## 2014-12-21 NOTE — Patient Instructions (Signed)
Shaun Harris  12/21/2014     @PREFPERIOPPHARMACY @   Your procedure is scheduled on 09/24/2014  Report to [facility] at  68  A.M.  Call this number if you have problems the morning of surgery:  [phone]   Remember:  Do not eat food or drink liquids after midnight.  Take these medicines the morning of surgery with A SIP OF WATER  Hytrin,zanaflex, xanax, voltaren, vasotec, apriso, keppra, morphine. Take your inhlaer and advair before you come. Take 1.2 of your usual insulin dosage the night before your procedure. Do not take anything for your diabetes the morning of surgery.   Do not wear jewelry, make-up or nail polish.  Do not wear lotions, powders, or perfumes.   Do not shave 48 hours prior to surgery.  Men may shave face and neck.  Do not bring valuables to the hospital.  [facility] is not responsible for any belongings or valuables.  Contacts, dentures or bridgework may not be worn into surgery.  Leave your suitcase in the car.  After surgery it may be brought to your room.  For patients admitted to the hospital, discharge time will be determined by your treatment team.  Patients discharged the day of surgery will not be allowed to drive home.   Name and phone number of your driver:   Mother Special instructions:  none  Please read over the following fact sheets that you were given. Pain Booklet, Coughing and Deep Breathing, Surgical Site Infection Prevention, Anesthesia Post-op Instructions and Care and Recovery After Surgery      Arthroscopic Procedure, Knee An arthroscopic procedure can find what is wrong with your knee. PROCEDURE Arthroscopy is a surgical technique that allows your orthopedic surgeon to diagnose and treat your knee injury with accuracy. They will look into your knee through a small instrument. This is almost like a small (pencil sized) telescope. Because arthroscopy affects your knee less than open knee surgery, you can anticipate a more  rapid recovery. Taking an active role by following your caregiver's instructions will help with rapid and complete recovery. Use crutches, rest, elevation, ice, and knee exercises as instructed. The length of recovery depends on various factors including type of injury, age, physical condition, medical conditions, and your rehabilitation. Your knee is the joint between the large bones (femur and tibia) in your leg. Cartilage covers these bone ends which are smooth and slippery and allow your knee to bend and move smoothly. Two menisci, thick, semi-lunar shaped pads of cartilage which form a rim inside the joint, help absorb shock and stabilize your knee. Ligaments bind the bones together and support your knee joint. Muscles move the joint, help support your knee, and take stress off the joint itself. Because of this all programs and physical therapy to rehabilitate an injured or repaired knee require rebuilding and strengthening your muscles. AFTER THE PROCEDURE  After the procedure, you will be moved to a recovery area until most of the effects of the medication have worn off. Your caregiver will discuss the test results with you.  Only take over-the-counter or prescription medicines for pain, discomfort, or fever as directed by your caregiver. SEEK MEDICAL CARE IF:   You have increased bleeding from your wounds.  You see redness, swelling, or have increasing pain in your wounds.  You have pus coming from your wound.  You have an oral temperature above 102 F (38.9 C).  You notice a bad smell coming  from the wound or dressing.  You have severe pain with any motion of your knee. SEEK IMMEDIATE MEDICAL CARE IF:   You develop a rash.  You have difficulty breathing.  You have any allergic problems. Document Released: 06/28/2000 Document Revised: 09/23/2011 Document Reviewed: 01/20/2008 [facility] Patient Information 2015 [place], [place]. This information is not intended to replace advice  given to you by your health care provider. Make sure you discuss any questions you have with your health care provider. PATIENT INSTRUCTIONS POST-ANESTHESIA  IMMEDIATELY FOLLOWING SURGERY:  Do not drive or operate machinery for the first twenty four hours after surgery.  Do not make any important decisions for twenty four hours after surgery or while taking narcotic pain medications or sedatives.  If you develop intractable nausea and vomiting or a severe headache please notify your doctor immediately.  FOLLOW-UP:  Please make an appointment with your surgeon as instructed. You do not need to follow up with anesthesia unless specifically instructed to do so.  WOUND CARE INSTRUCTIONS (if applicable):  Keep a dry clean dressing on the anesthesia/puncture wound site if there is drainage.  Once the wound has quit draining you may leave it open to air.  Generally you should leave the bandage intact for twenty four hours unless there is drainage.  If the epidural site drains for more than 36-48 hours please call the anesthesia department.  QUESTIONS?:  Please feel free to call your physician or the hospital operator if you have any questions, and they will be happy to assist you.

## 2014-12-21 NOTE — Pre-Procedure Instructions (Signed)
Patient given information to sign up for my chart at home. 

## 2014-12-23 ENCOUNTER — Encounter (HOSPITAL_COMMUNITY): Payer: Self-pay | Admitting: *Deleted

## 2014-12-23 ENCOUNTER — Ambulatory Visit (HOSPITAL_COMMUNITY)
Admission: RE | Admit: 2014-12-23 | Discharge: 2014-12-23 | Disposition: A | Discharge status: Home / Self Care | Payer: No Typology Code available for payment source | Source: Ambulatory Visit | Attending: Orthopaedic Surgery | Admitting: Orthopaedic Surgery

## 2014-12-23 ENCOUNTER — Encounter (HOSPITAL_COMMUNITY)
Admission: RE | Disposition: A | Discharge status: Home / Self Care | Payer: Self-pay | Source: Ambulatory Visit | Attending: Orthopaedic Surgery

## 2014-12-23 ENCOUNTER — Ambulatory Visit (HOSPITAL_COMMUNITY): Payer: No Typology Code available for payment source | Admitting: Anesthesiology

## 2014-12-23 DIAGNOSIS — F419 Anxiety disorder, unspecified: Secondary | ICD-10-CM | POA: Insufficient documentation

## 2014-12-23 DIAGNOSIS — J449 Chronic obstructive pulmonary disease, unspecified: Secondary | ICD-10-CM | POA: Insufficient documentation

## 2014-12-23 DIAGNOSIS — E785 Hyperlipidemia, unspecified: Secondary | ICD-10-CM | POA: Insufficient documentation

## 2014-12-23 DIAGNOSIS — M170 Bilateral primary osteoarthritis of knee: Secondary | ICD-10-CM | POA: Diagnosis not present

## 2014-12-23 DIAGNOSIS — M160 Bilateral primary osteoarthritis of hip: Secondary | ICD-10-CM | POA: Diagnosis not present

## 2014-12-23 DIAGNOSIS — I10 Essential (primary) hypertension: Secondary | ICD-10-CM | POA: Diagnosis not present

## 2014-12-23 DIAGNOSIS — S83242A Other tear of medial meniscus, current injury, left knee, initial encounter: Secondary | ICD-10-CM | POA: Diagnosis not present

## 2014-12-23 DIAGNOSIS — G4730 Sleep apnea, unspecified: Secondary | ICD-10-CM | POA: Insufficient documentation

## 2014-12-23 DIAGNOSIS — G894 Chronic pain syndrome: Secondary | ICD-10-CM | POA: Insufficient documentation

## 2014-12-23 DIAGNOSIS — E781 Pure hyperglyceridemia: Secondary | ICD-10-CM | POA: Insufficient documentation

## 2014-12-23 DIAGNOSIS — E119 Type 2 diabetes mellitus without complications: Secondary | ICD-10-CM | POA: Diagnosis not present

## 2014-12-23 DIAGNOSIS — F17210 Nicotine dependence, cigarettes, uncomplicated: Secondary | ICD-10-CM | POA: Insufficient documentation

## 2014-12-23 DIAGNOSIS — Z8673 Personal history of transient ischemic attack (TIA), and cerebral infarction without residual deficits: Secondary | ICD-10-CM | POA: Insufficient documentation

## 2014-12-23 HISTORY — PX: KNEE ARTHROSCOPY WITH MEDIAL MENISECTOMY: SHX5651

## 2014-12-23 LAB — GLUCOSE, CAPILLARY
Glucose-Capillary: 120 mg/dL — ABNORMAL HIGH (ref 65–99)
Glucose-Capillary: 93 mg/dL (ref 65–99)

## 2014-12-23 SURGERY — ARTHROSCOPY, KNEE, WITH MEDIAL MENISCECTOMY
Anesthesia: General | Site: Knee | Laterality: Left

## 2014-12-23 MED ORDER — ROCURONIUM BROMIDE 100 MG/10ML IV SOLN
INTRAVENOUS | Status: DC | PRN
  Administered 2014-12-23: 8 mg via INTRAVENOUS

## 2014-12-23 MED ORDER — LACTATED RINGERS IR SOLN
Status: DC | PRN
  Administered 2014-12-23 (×2): 3000 mL

## 2014-12-23 MED ORDER — FENTANYL CITRATE (PF) 100 MCG/2ML IJ SOLN
25.0000 ug | INTRAMUSCULAR | Status: DC | PRN
  Administered 2014-12-23 (×2): 25 ug via INTRAVENOUS

## 2014-12-23 MED ORDER — MIDAZOLAM HCL 2 MG/2ML IJ SOLN
1.0000 mg | INTRAMUSCULAR | Status: DC | PRN
  Administered 2014-12-23: 2 mg via INTRAVENOUS

## 2014-12-23 MED ORDER — SUCCINYLCHOLINE CHLORIDE 20 MG/ML IJ SOLN
INTRAMUSCULAR | Status: DC | PRN
  Administered 2014-12-23: 160 mg via INTRAVENOUS

## 2014-12-23 MED ORDER — ONDANSETRON HCL 4 MG/2ML IJ SOLN
4.0000 mg | Freq: Once | INTRAMUSCULAR | Status: AC
Start: 2014-12-23 — End: 2014-12-23
  Administered 2014-12-23: 4 mg via INTRAVENOUS

## 2014-12-23 MED ORDER — ONDANSETRON HCL 4 MG/2ML IJ SOLN
INTRAMUSCULAR | Status: AC
  Filled 2014-12-23: qty 2

## 2014-12-23 MED ORDER — LACTATED RINGERS IV SOLN
INTRAVENOUS | Status: DC
Start: 2014-12-23 — End: 2014-12-23
  Administered 2014-12-23: 12:00:00 via INTRAVENOUS

## 2014-12-23 MED ORDER — PROPOFOL 10 MG/ML IV BOLUS
INTRAVENOUS | Status: AC
  Filled 2014-12-23: qty 20

## 2014-12-23 MED ORDER — MIDAZOLAM HCL 2 MG/2ML IJ SOLN
INTRAMUSCULAR | Status: AC
  Filled 2014-12-23: qty 2

## 2014-12-23 MED ORDER — BUPIVACAINE HCL (PF) 0.25 % IJ SOLN
INTRAMUSCULAR | Status: AC
  Filled 2014-12-23: qty 30

## 2014-12-23 MED ORDER — FENTANYL CITRATE (PF) 250 MCG/5ML IJ SOLN
INTRAMUSCULAR | Status: AC
  Filled 2014-12-23: qty 5

## 2014-12-23 MED ORDER — FENTANYL CITRATE (PF) 100 MCG/2ML IJ SOLN
INTRAMUSCULAR | Status: AC
  Filled 2014-12-23: qty 2

## 2014-12-23 MED ORDER — BUPIVACAINE HCL (PF) 0.25 % IJ SOLN
INTRAMUSCULAR | Status: DC | PRN
  Administered 2014-12-23: 30 mL

## 2014-12-23 MED ORDER — PROPOFOL 10 MG/ML IV BOLUS
INTRAVENOUS | Status: DC | PRN
  Administered 2014-12-23: 200 mg via INTRAVENOUS
  Administered 2014-12-23: 50 mg via INTRAVENOUS

## 2014-12-23 MED ORDER — ONDANSETRON HCL 4 MG/2ML IJ SOLN: 4.0000 mg | Freq: Once | INTRAMUSCULAR | Status: DC | PRN

## 2014-12-23 MED ORDER — GLYCOPYRROLATE 0.2 MG/ML IJ SOLN
INTRAMUSCULAR | Status: AC
  Filled 2014-12-23: qty 1

## 2014-12-23 MED ORDER — MIDAZOLAM HCL 2 MG/2ML IJ SOLN
INTRAMUSCULAR | Status: DC | PRN
  Administered 2014-12-23: 2 mg via INTRAVENOUS

## 2014-12-23 MED ORDER — FENTANYL CITRATE (PF) 100 MCG/2ML IJ SOLN
25.0000 ug | INTRAMUSCULAR | Status: DC | PRN
  Administered 2014-12-23: 25 ug via INTRAVENOUS

## 2014-12-23 MED ORDER — GLYCOPYRROLATE 0.2 MG/ML IJ SOLN
0.2000 mg | Freq: Once | INTRAMUSCULAR | Status: AC
  Administered 2014-12-23: 0.2 mg via INTRAVENOUS

## 2014-12-23 MED ORDER — FENTANYL CITRATE (PF) 100 MCG/2ML IJ SOLN
INTRAMUSCULAR | Status: DC | PRN
  Administered 2014-12-23 (×4): 50 ug via INTRAVENOUS

## 2014-12-23 SURGICAL SUPPLY — 58 items
BAG HAMPER (MISCELLANEOUS) ×2 IMPLANT
BANDAGE ELASTIC 6 VELCRO NS (GAUZE/BANDAGES/DRESSINGS) ×2 IMPLANT
BANDAGE ESMARK 4X12 BL STRL LF (DISPOSABLE) ×1 IMPLANT
BLADE SURG 15 STRL LF DISP TIS (BLADE) ×1 IMPLANT
BLADE SURG 15 STRL SS (BLADE) ×1
BLADE SURG SZ11 CARB STEEL (BLADE) ×2 IMPLANT
BNDG ESMARK 4X12 BLUE STRL LF (DISPOSABLE) ×2
CLOTH BEACON ORANGE TIMEOUT ST (SAFETY) ×2 IMPLANT
CUFF TOURNIQUET SINGLE 34IN LL (TOURNIQUET CUFF) IMPLANT
CUFF TOURNIQUET SINGLE 44IN (TOURNIQUET CUFF) ×2 IMPLANT
CUTTER TOMCAT 4.0M (BURR) ×2 IMPLANT
CUTTER TOMCAT 5.0MM (BURR) ×2 IMPLANT
DECANTER SPIKE VIAL GLASS SM (MISCELLANEOUS) ×2 IMPLANT
DRAPE PROXIMA HALF (DRAPES) ×2 IMPLANT
DRSG XEROFORM 1X8 (GAUZE/BANDAGES/DRESSINGS) ×2 IMPLANT
DURAPREP 26ML APPLICATOR (WOUND CARE) ×2 IMPLANT
ELECT MENISCUS 165MM 90D (ELECTRODE) IMPLANT
FORMALIN 10 PREFIL 480ML (MISCELLANEOUS) ×2 IMPLANT
GAUZE SPONGE 4X4 12PLY STRL (GAUZE/BANDAGES/DRESSINGS) ×2 IMPLANT
GAUZE SPONGE 4X4 16PLY XRAY LF (GAUZE/BANDAGES/DRESSINGS) IMPLANT
GLOVE BIO SURGEON STRL SZ8 (GLOVE) ×2 IMPLANT
GLOVE BIO SURGEON STRL SZ8.5 (GLOVE) ×2 IMPLANT
GLOVE BIOGEL PI IND STRL 7.0 (GLOVE) ×2 IMPLANT
GLOVE BIOGEL PI IND STRL 7.5 (GLOVE) ×1 IMPLANT
GLOVE BIOGEL PI INDICATOR 7.0 (GLOVE) ×2
GLOVE BIOGEL PI INDICATOR 7.5 (GLOVE) ×1
GLOVE ECLIPSE 7.0 STRL STRAW (GLOVE) ×2 IMPLANT
GOWN STRL REUS W/TWL LRG LVL3 (GOWN DISPOSABLE) ×4 IMPLANT
GOWN STRL REUS W/TWL XL LVL3 (GOWN DISPOSABLE) ×2 IMPLANT
HANDPIECE (MISCELLANEOUS) IMPLANT
HLDR LEG FOAM (MISCELLANEOUS) ×1 IMPLANT
IV LACTATED RINGER IRRG 3000ML (IV SOLUTION) ×2
IV LR IRRIG 3000ML ARTHROMATIC (IV SOLUTION) ×2 IMPLANT
KIT BLADEGUARD II DBL (SET/KITS/TRAYS/PACK) ×2 IMPLANT
KIT ROOM TURNOVER AP CYSTO (KITS) ×2 IMPLANT
KNIFE HOOK (BLADE) IMPLANT
KNIFE MENISECTOMY (BLADE) IMPLANT
LEG HOLDER FOAM (MISCELLANEOUS) ×1
MANIFOLD NEPTUNE II (INSTRUMENTS) ×2 IMPLANT
MARKER SKIN DUAL TIP RULER LAB (MISCELLANEOUS) ×2 IMPLANT
NEEDLE HYPO 21X1.5 SAFETY (NEEDLE) ×2 IMPLANT
NEEDLE SPNL 18GX3.5 QUINCKE PK (NEEDLE) ×2 IMPLANT
NS IRRIG 1000ML POUR BTL (IV SOLUTION) ×2 IMPLANT
PACK ARTHRO LIMB DRAPE STRL (MISCELLANEOUS) ×2 IMPLANT
PAD ABD 5X9 TENDERSORB (GAUZE/BANDAGES/DRESSINGS) ×2 IMPLANT
PAD ARMBOARD 7.5X6 YLW CONV (MISCELLANEOUS) ×2 IMPLANT
PADDING CAST COTTON 6X4 STRL (CAST SUPPLIES) ×2 IMPLANT
PADDING WEBRIL 6 STERILE (GAUZE/BANDAGES/DRESSINGS) ×2 IMPLANT
SET ARTHROSCOPY INST (INSTRUMENTS) ×2 IMPLANT
SET BASIN LINEN APH (SET/KITS/TRAYS/PACK) ×2 IMPLANT
SPONGE GAUZE 4X4 12PLY (GAUZE/BANDAGES/DRESSINGS) ×2 IMPLANT
SUT ETHILON 3 0 FSL (SUTURE) ×2 IMPLANT
SYR 30ML LL (SYRINGE) ×2 IMPLANT
TIP 0 DEGREE (MISCELLANEOUS) IMPLANT
TIP 30 DEGREE (MISCELLANEOUS) IMPLANT
TIP 70 DEGREE (MISCELLANEOUS) IMPLANT
TUBING FLOSTEADY ARTHRO PUMP (IRRIGATION / IRRIGATOR) ×2 IMPLANT
YANKAUER SUCT BULB TIP 10FT TU (MISCELLANEOUS) ×4 IMPLANT

## 2014-12-23 NOTE — Discharge Instructions (Signed)
Use crutches/walker.  Use ice to the left knee as desired.  You may remove the left knee dressing when you want.  Cleanse wound with peroxide.  Otherwise keep the wounds dry.  You may apply Band-aids to the stitches.  Keep appointment with Dr. Luna Glasgow in about ten days.  Keep appointment with physical therapy.  Call Dr. Brooke Bonito office at [phone] if any problem, or if after hours, call the hospital at [phone].  Arthroscopic Procedure, Knee, Care After Refer to this sheet in the next few weeks. These discharge instructions provide you with general information on caring for yourself after you leave the hospital. Your health care provider may also give you specific instructions. Your treatment has been planned according to the most current medical practices available, but unavoidable complications sometimes occur. If you have any problems or questions after discharge, please call your health care provider. HOME CARE INSTRUCTIONS   It is normal to be sore for a couple days after surgery. See your health care provider if this seems to be getting worse rather than better.  Only take over-the-counter or prescription medicines for pain, discomfort, or fever as directed by your health care provider.  Take showers rather than baths, or as directed by your health care provider.  Change bandages (dressings) if necessary or as directed.  You may resume normal diet and activities as directed or allowed.  Avoid lifting and driving until you are directed otherwise.  Make an appointment to see your health care provider for stitches (suture) or staple removal as directed.  You may put ice on the area.  Put the ice in a plastic bag. Place a towel between your skin and the bag.  Leave the ice on for 15-20 minutes, three to four times per day for the first 2 days.  Elevate the knee above the level of your heart to reduce swelling, and avoid dangling the leg.  Do 10-15 ankle pumps (pointing  your toes toward you and then away from you) two to three times daily.  If you are given compression stockings to wear after surgery, use them for as long as your surgeon tells you (around 10-14 days).  Avoid smoking and exposure to secondhand smoke. SEEK MEDICAL CARE IF:   You have increased bleeding from your wounds.  You see redness or swelling or you have increasing pain in your wounds.  You have pus coming from your wound.  You have a fever or persistent symptoms for more than 2-3 days.  You notice a bad smell coming from the wound or dressing.  You have severe pain with any motion of your knee. SEEK IMMEDIATE MEDICAL CARE IF:   You develop a rash.  You have difficulty breathing.  You develop any reaction or side effects to medicines taken.  You develop pain in the calves or back of the knee.  You develop chest pain, shortness of breath, or difficulty breathing.  You develop numbness or tingling in the leg or foot. MAKE SURE YOU:   Understand these instructions.  Will watch your condition.  Will get help right away if you are not doing well or you get worse. Document Released: 01/18/2005 Document Revised: 07/06/2013 Document Reviewed: 11/26/2012 [facility] Patient Information 2015 [place], [place]. This information is not intended to replace advice given to you by your health care provider. Make sure you discuss any questions you have with your health care provider.

## 2014-12-23 NOTE — Transfer of Care (Signed)
Immediate Anesthesia Transfer of Care Note  Patient: Shaun Harris  Procedure(s) Performed: Procedure(s): LEFT KNEE ARTHROSCOPY WITH PARTIAL MENISECTOMY (Left)  Patient Location: PACU  Anesthesia Type:General  Level of Consciousness: awake, alert  and oriented  Airway & Oxygen Therapy: Patient Spontanous Breathing  Post-op Assessment: Report given to RN  Post vital signs: Reviewed and stable  Last Vitals:  Filed Vitals:   12/23/14 1220  BP:   Pulse:   Temp:   Resp: 15    Complications: No apparent anesthesia complications

## 2014-12-23 NOTE — Anesthesia Procedure Notes (Signed)
Procedure Name: Intubation Date/Time: 12/23/2014 12:57 PM Performed by: Tressie Stalker E Pre-anesthesia Checklist: Patient identified, Patient being monitored, Timeout performed, Emergency Drugs available and Suction available Patient Re-evaluated:Patient Re-evaluated prior to inductionOxygen Delivery Method: Circle System Utilized Preoxygenation: Pre-oxygenation with 100% oxygen Intubation Type: IV induction and Rapid sequence Ventilation: Mask ventilation without difficulty Grade View: Grade I Tube type: Oral Tube size: 7.0 mm Number of attempts: 1 Airway Equipment and Method: Stylet and Video-laryngoscopy Placement Confirmation: ETT inserted through vocal cords under direct vision,  positive ETCO2 and breath sounds checked- equal and bilateral Secured at: 21 cm Tube secured with: Tape Dental Injury: Teeth and Oropharynx as per pre-operative assessment  Difficulty Due To: Difficulty was anticipated Comments: Dr. Patsey Berthold present and performed intubation.

## 2014-12-23 NOTE — Anesthesia Postprocedure Evaluation (Addendum)
  Anesthesia Post-op Note  Patient: Shaun Harris  Procedure(s) Performed: Procedure(s): LEFT KNEE ARTHROSCOPY WITH PARTIAL MENISECTOMY (Left)  Patient Location: PACU  Anesthesia Type:General  Level of Consciousness: awake, alert  and oriented  Airway and Oxygen Therapy: Patient Spontanous Breathing  Post-op Pain: moderate  Post-op Assessment: Post-op Vital signs reviewed, Patient's Cardiovascular Status Stable, Respiratory Function Stable, Patent Airway and No signs of Nausea or vomiting              Post-op Vital Signs: Reviewed and stable  Last Vitals:  Filed Vitals:   12/23/14 1430  BP: 117/55  Pulse: 92  Temp:   Resp: 17    Complications: No apparent anesthesia complications.  Explained to patient that a decayed tooth came out when we removed the oral airway.  Patient understood and was not upset or concerned with that.  Pain under control and beint discharged home.
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
## 2014-12-23 NOTE — Anesthesia Preprocedure Evaluation (Signed)
Anesthesia Evaluation  Patient identified by MRN, date of birth, ID band Patient awake    Reviewed: Allergy & Precautions, H&P , NPO status , Patient's Chart, lab work & pertinent test results  History of Anesthesia Complications (+) DIFFICULT AIRWAY and history of anesthetic complications (difficult intubation w/ first surgery in 2004, no problems with later surgery.)  Airway Mallampati: IV  TM Distance: <3 FB   Mouth opening: Limited Mouth Opening  Dental  (+) Partial Upper   Pulmonary sleep apnea and Continuous Positive Airway Pressure Ventilation , COPDCurrent Smoker,    Pulmonary exam normal       Cardiovascular hypertension, Pt. on medications Rhythm:Regular     Neuro/Psych  Headaches, Anxiety CVA    GI/Hepatic   Endo/Other  diabetes, Poorly Controlled, Type 2, Oral Hypoglycemic Agents, Insulin DependentMorbid obesity  Renal/GU      Musculoskeletal   Abdominal   Peds  Hematology   Anesthesia Other Findings   Reproductive/Obstetrics                             Anesthesia Physical Anesthesia Plan  ASA: III  Anesthesia Plan: General   Post-op Pain Management:    Induction: Intravenous, Rapid sequence and Cricoid pressure planned  Airway Management Planned: Oral ETT and Video Laryngoscope Planned  Additional Equipment:   Intra-op Plan:   Post-operative Plan: Extubation in OR  Informed Consent: I have reviewed the patients History and Physical, chart, labs and discussed the procedure including the risks, benefits and alternatives for the proposed anesthesia with the patient or authorized representative who has indicated his/her understanding and acceptance.     Plan Discussed with:   Anesthesia Plan Comments:         Anesthesia Quick Evaluation

## 2014-12-23 NOTE — Brief Op Note (Signed)
12/23/2014  1:36 PM  PATIENT:  Shaun Harris  49 y.o. male  PRE-OPERATIVE DIAGNOSIS:  tear left knee medial meniscus  POST-OPERATIVE DIAGNOSIS:  tear left knee medial meniscus  PROCEDURE:  Procedure(s): LEFT KNEE ARTHROSCOPY WITH PARTIAL MENISECTOMY (Left)  SURGEON:  Surgeon(s) and Role:    * Sanjuana Kava, MD - Primary  PHYSICIAN ASSISTANT:   ASSISTANTS: none   ANESTHESIA:   general  EBL:     BLOOD ADMINISTERED:none  DRAINS: none   LOCAL MEDICATIONS USED:  MARCAINE     SPECIMEN:  Source of Specimen:  Left knee medial meniscus shavings  DISPOSITION OF SPECIMEN:  PATHOLOGY  COUNTS:  YES  TOURNIQUET:   Total Tourniquet Time Documented: Thigh (Left) - 18 minutes Total: Thigh (Left) - 18 minutes   DICTATION: .Other Dictation: Dictation Number [id number]  PLAN OF CARE: Discharge to home after PACU  PATIENT DISPOSITION:  PACU - hemodynamically stable.   Delay start of Pharmacological VTE agent (>24hrs) due to surgical blood loss or risk of bleeding: not applicable
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
## 2014-12-26 ENCOUNTER — Ambulatory Visit (HOSPITAL_COMMUNITY): Payer: No Typology Code available for payment source | Admitting: Physical Therapy

## 2014-12-26 ENCOUNTER — Encounter (HOSPITAL_COMMUNITY): Payer: Self-pay | Admitting: Orthopaedic Surgery

## 2014-12-26 DIAGNOSIS — R293 Abnormal posture: Secondary | ICD-10-CM | POA: Insufficient documentation

## 2014-12-26 DIAGNOSIS — R531 Weakness: Secondary | ICD-10-CM | POA: Insufficient documentation

## 2014-12-26 DIAGNOSIS — M25562 Pain in left knee: Secondary | ICD-10-CM

## 2014-12-26 DIAGNOSIS — M25462 Effusion, left knee: Secondary | ICD-10-CM | POA: Insufficient documentation

## 2014-12-26 DIAGNOSIS — M25662 Stiffness of left knee, not elsewhere classified: Secondary | ICD-10-CM | POA: Insufficient documentation

## 2014-12-26 DIAGNOSIS — M25659 Stiffness of unspecified hip, not elsewhere classified: Secondary | ICD-10-CM | POA: Insufficient documentation

## 2014-12-26 DIAGNOSIS — Z9889 Other specified postprocedural states: Secondary | ICD-10-CM

## 2014-12-26 DIAGNOSIS — R269 Unspecified abnormalities of gait and mobility: Secondary | ICD-10-CM | POA: Insufficient documentation

## 2014-12-26 DIAGNOSIS — R609 Edema, unspecified: Secondary | ICD-10-CM

## 2014-12-26 DIAGNOSIS — R29898 Other symptoms and signs involving the musculoskeletal system: Secondary | ICD-10-CM

## 2014-12-26 LAB — TOXASSURE SELECT 13 (MW), URINE: PDF: 0

## 2014-12-26 NOTE — Patient Instructions (Signed)
   Quad Sets  Sit with leg out straight in front of you. Place small towel roll under knee. Tighten the muscles around your knee and press towel into the mat. Hold for 3 seconds, then relax.  Repeat 10 times, 2-3 times a day.      ANKLE PUMPS - AP  Bend your foot up and down at your ankle joint as shown. Repeat 10-20 times in laying down or sitting up, 2-3 times a day.      Short Arc Quads (SAQs)  While lying face up, place two rolled towels under the knee to lift it about 5 inches off the mat. Bring your toes towards your head (dorsiflexion)then straighten the knee for a hold of 3-5 seconds. Slowly lower your leg back to the mat. Repeat 10 times, twice a day.    Seated Hip Adduction/Pillow  Squeeze  In seated position, place a ball (or pillow or bolster) between knees. Squeeze firmly. Relax. Repeat as instructed, 10 times, twice a day. START POSITION: Horse Stance   Stand with feet flat, parallel, hip-width apart. Knees slightly bent, weight evenly distributed over feet. Low back relaxed, chin tucked, head lifted from above, arms relaxed. Eyes look forward with level gaze, awareness of periphery. Gently shift your weight to the R side, then the L side; if you have sharp or increased pain, do not push through it, simply shift your weight to the other leg. Repeat 20 times with your walker in front of you.    Copyright  [employer]. All rights reserved.
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
## 2014-12-27 ENCOUNTER — Ambulatory Visit (HOSPITAL_COMMUNITY): Payer: Medicare Other | Admitting: Physical Therapy

## 2014-12-28 ENCOUNTER — Ambulatory Visit (HOSPITAL_COMMUNITY): Payer: Medicare Other | Attending: Orthopaedic Surgery | Admitting: Physical Therapy

## 2014-12-28 ENCOUNTER — Telehealth (HOSPITAL_COMMUNITY): Payer: Self-pay | Admitting: Physical Therapy

## 2014-12-28 DIAGNOSIS — M25462 Effusion, left knee: Secondary | ICD-10-CM | POA: Diagnosis not present

## 2014-12-28 DIAGNOSIS — Z9889 Other specified postprocedural states: Secondary | ICD-10-CM

## 2014-12-28 DIAGNOSIS — R609 Edema, unspecified: Secondary | ICD-10-CM

## 2014-12-28 DIAGNOSIS — R29898 Other symptoms and signs involving the musculoskeletal system: Secondary | ICD-10-CM

## 2014-12-28 DIAGNOSIS — M25562 Pain in left knee: Secondary | ICD-10-CM | POA: Diagnosis not present

## 2014-12-28 DIAGNOSIS — R531 Weakness: Secondary | ICD-10-CM | POA: Diagnosis not present

## 2014-12-28 DIAGNOSIS — R269 Unspecified abnormalities of gait and mobility: Secondary | ICD-10-CM | POA: Diagnosis not present

## 2014-12-28 DIAGNOSIS — M25662 Stiffness of left knee, not elsewhere classified: Secondary | ICD-10-CM

## 2014-12-28 DIAGNOSIS — M25659 Stiffness of unspecified hip, not elsewhere classified: Secondary | ICD-10-CM | POA: Diagnosis not present

## 2014-12-28 NOTE — Patient Instructions (Signed)
Heel Raise: Bilateral (Standing)   Rise on balls of feet. Repeat __10__ times per set. Do _1___ sets per session. Do _2-3___ sessions per day.  http://orth.exer.us/38   Copyright  VHI. All rights reserved.  Functional Quadriceps: Chair Squat   Keeping feet flat on floor, shoulder width apart, squat as low as is comfortable. Use support as necessary. Repeat _10___ times per set. Do __1__ sets per session. Do _2-3___ sessions per day.  http://orth.exer.us/736   Copyright  VHI. All rights reserved.  Strengthening: Straight Leg Raise (Phase 1)   Tighten muscles on front of right thigh, then lift leg _15-18___ inches from surface, keeping knee locked.  Repeat __10__ times per set. Do _1___ sets per session. Do _2-3___ sessions per day.  http://orth.exer.us/614   Copyright  VHI. All rights reserved.  Strengthening: Hip Abduction (Side-Lying)   Tighten muscles on front of left thigh, then lift leg _15__ inches from surface, keeping knee locked.  Repeat __10__ times per set. Do __1__ sets per session. Do _2-3___ sessions per day.  http://orth.exer.us/622   Copyright  VHI. All rights reserved.  Strengthening: Hip Extension (Prone)   Tighten muscles on front of left thigh, then lift leg _2___ inches from surface, keeping knee locked. Repeat __10__ times per set. Do _1___ sets per session. Do __2-3__ sessions per day.  http://orth.exer.us/620   Copyright  VHI. All rights reserved.  Self-Mobilization: Knee Flexion (Prone)   Bring left heel toward buttocks as close as possible. Hold __30__ seconds. Relax. Repeat __3__ times per set. Do _1___ sets per session. Do _2-3___ sessions per day.  http://orth.exer.us/596   Copyright  VHI. All rights reserved.
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
## 2014-12-28 NOTE — Therapy (Signed)
[place], [place], [address] Phone: [phone]   Fax:  [phone]  Physical Therapy Treatment  Patient Details  Name: Shaun Harris MRN: [id number] Date of Birth: 06/12/1966 Referring Provider:  Sanjuana Kava, MD  Encounter Date: 12/28/2014      PT End of Session - 12/28/14 1104    Visit Number 2   Number of Visits 12   Date for PT Re-Evaluation 01/23/15   Authorization Type Medicare    Authorization Time Period 12/26/14 to 02/25/15   Authorization - Visit Number 2   Authorization - Number of Visits 10   PT Start Time 1021   PT Stop Time 1102   PT Time Calculation (min) 41 min   Activity Tolerance Patient tolerated treatment well      Past Medical History  Diagnosis Date  . Hypertension   . Diabetes mellitus   . COPD (chronic obstructive pulmonary disease)   . Anxiety   . Ulcerative colitis   . Ulcerative colitis   . Hypertriglyceridemia   . Venous stasis   . COPD (chronic obstructive pulmonary disease)   . Tobacco abuse   . Hyperlipidemia   . Stroke     pt states,"they arent sure if i had a stroke in 1997, there is still a question about that with my doctors". No deficits.  . Chronic headaches     constant since possible stroke in 1997,"Vasculitis of brain with increased pressure of brain ".  . Sleep apnea     has but doesnt use, cannot tolerate; PCP aware  . Arthritis   . Cerebral vasculitis     Past Surgical History  Procedure Laterality Date  . Cervical spine surgery  2008  . Shoulder arthroscopy  2004    left shoulder  . Shoulder arthroscopy  2008    right shoulder  . Knee arthroscopy  02/25/2012    Procedure: ARTHROSCOPY KNEE;  Surgeon: Sanjuana Kava, MD;  Location: [facility];  Service: Orthopedics;  Laterality: Right;  . Knee arthroscopy with medial menisectomy Left 12/23/2014    Procedure: LEFT KNEE ARTHROSCOPY WITH PARTIAL MENISECTOMY;  Surgeon: Sanjuana Kava, MD;  Location: [facility];  Service: Orthopedics;  Laterality: Left;    There were no vitals filed for this visit.  Visit Diagnosis:  S/P left knee arthroscopy  Left knee pain  Knee stiffness, left  Edema  Bilateral leg weakness      Subjective Assessment - 12/28/14 1020    Subjective Pt states he has been icing and complleting his exercises   Pain Score 6    Pain Location Knee   Pain Orientation Left;Medial;Posterior              [facility] Adult PT Treatment/Exercise - 12/28/14 0001    Exercises   Exercises Knee/Hip   Knee/Hip Exercises: Stretches   Passive Hamstring Stretch 3 reps;30 seconds   Gastroc Stretch 3 reps;30 seconds   Gastroc Stretch Limitations slant boar f    Knee/Hip Exercises: Standing   Heel Raises 10 reps   Knee Flexion Strengthening;10 reps   Knee Flexion Limitations 4 pounds   Functional Squat 10 reps   Knee/Hip Exercises: Seated   Long Arc Quad 10 reps   Knee/Hip Exercises: Supine   Quad Sets Left;10 reps   Short Arc [employer] 10 reps   Short Arc Quad Sets Limitations 4 pounds   Straight Leg Raises Left;5 reps   Knee/Hip Exercises: Sidelying   Hip ABduction 10  reps   Hip ABduction Limitations 4 pounds   Knee/Hip Exercises: Prone   Hamstring Curl 10 reps   Hamstring Curl Limitations 4 pounds   Hip Extension 10 reps   Hip Extension Limitations 4 pounds                PT Education - 12/28/14 1103    Education provided Yes   Education Details Pt educated on HEP   Person(s) Educated Patient   Methods Explanation;Handout   Comprehension Verbalized understanding;Returned demonstration          PT Short Term Goals - 12/28/14 1109    PT SHORT TERM GOAL #1   Title Patient will experience no more than 4/10 pain in his L knee during all gait based takss and weight bearing functional activities    Time 2   Period Weeks   Status On-going   PT SHORT TERM GOAL #2   Title Patient will demonstrate the ability to safely and efficiently use crutches, both  bilateral and unilaterally, to assist with mobility and gait with no falls reported on a consistent basis    Time 2   Period Weeks   Status On-going   PT SHORT TERM GOAL #3   Title Patient will demonstrate bilateral hip ER of at least 35 degrees and bilateral hip IR of at least 30 degrees in order to improve overall mobility and mechanics    Time 2   Period Weeks   Status On-going   PT SHORT TERM GOAL #4   Title Patient will demonstrate at least 4+/5 strength in bilateral lower extremities and at least 4-/5 strength in all proximal musculature    Time 2   Period Weeks   Status On-going   PT SHORT TERM GOAL #5   Title Patient will demonstrate enhanced gait mechanics as evidenced by improved ability to bear weight down through L LE, improved step length R LE, improved stance time L LE, and improved overall gait speed with increased cadence    Time 2   Period Weeks   Status On-going           PT Long Term Goals - 12/28/14 1110    PT LONG TERM GOAL #1   Title Patient will be independent in correctly and consistently performing appropriate HEP, to be updated PRN    Time 4   Period Weeks   Status On-going   PT LONG TERM GOAL #2   Title Patient will experience no more than 1/10 pain in his L knee with all functional weight bearing tasks for an unlimited amount of time with no assistive device    Period Weeks   Status On-going   PT LONG TERM GOAL #3   Title Patient will be able to ambulate unlimited distances and over both even and unven surfaces with no assistive device and pain no more than 1/10 L knee    Time 4   Period Weeks   Status On-going   PT LONG TERM GOAL #4   Title Patient will be able to verbalize the importance of good, correct posture and will be able to maintain proper posture during all functional tasks with no cues    Time 4   Period Weeks   Status On-going   PT LONG TERM GOAL #5   Title Patient to demonstrate L knee extension of no more than 3 degrees and L  knee flexion of at least 120 degrees with pain 0/10   Time 4  Period Weeks   Status On-going               Plan - 12/28/14 1105    Clinical Impression Statement Pt continues to demonstrate weakness in Lt quadriceps and hamstring with edema present in Lt knee. He required verbal cueing to complete LAQ and SAQ due to quad fatigue. Pt demonstrates extension lag when completing SLR. Significant fatigue was noted with pt demonstrating shaking in musculature during quad and hamstring strengthening.    PT Next Visit Plan Begin forward and lateral step ups as well as beginning balance activities. Give pt copy of evaluation.    PT Home Exercise Plan given         Problem List Patient Active Problem List   Diagnosis Date Noted  . Venous stasis ulcer 03/10/2013  . COPD (chronic obstructive pulmonary disease) 03/10/2013  . Tobacco abuse 03/10/2013  . Morbid obesity 03/10/2013  . Diabetes 03/10/2013  . Diabetes mellitus without complication 05/17/1593  . HTN (hypertension) 10/01/2012  . Other and unspecified hyperlipidemia 10/01/2012  . Chronic pain syndrome 10/01/2012  . Knee pain 03/02/2012  . Knee stiffness 03/02/2012   Rayetta Humphrey, PT CLT [phone] 12/28/2014, 11:13 AM  [place], [place], [address] Phone: [phone]   Fax:  [phone]

## 2014-12-30 ENCOUNTER — Ambulatory Visit (HOSPITAL_COMMUNITY): Payer: Medicare Other

## 2014-12-30 DIAGNOSIS — R269 Unspecified abnormalities of gait and mobility: Secondary | ICD-10-CM

## 2014-12-30 DIAGNOSIS — R29898 Other symptoms and signs involving the musculoskeletal system: Secondary | ICD-10-CM

## 2014-12-30 DIAGNOSIS — M25659 Stiffness of unspecified hip, not elsewhere classified: Secondary | ICD-10-CM | POA: Diagnosis not present

## 2014-12-30 DIAGNOSIS — Z9889 Other specified postprocedural states: Secondary | ICD-10-CM

## 2014-12-30 DIAGNOSIS — M25462 Effusion, left knee: Secondary | ICD-10-CM | POA: Diagnosis not present

## 2014-12-30 DIAGNOSIS — R531 Weakness: Secondary | ICD-10-CM | POA: Diagnosis not present

## 2014-12-30 DIAGNOSIS — M25562 Pain in left knee: Secondary | ICD-10-CM

## 2014-12-30 DIAGNOSIS — M25662 Stiffness of left knee, not elsewhere classified: Secondary | ICD-10-CM

## 2014-12-30 DIAGNOSIS — R293 Abnormal posture: Secondary | ICD-10-CM

## 2014-12-30 DIAGNOSIS — R609 Edema, unspecified: Secondary | ICD-10-CM

## 2014-12-30 NOTE — Patient Instructions (Signed)
Continue HEP as planned. Continue icing knee PRN for pain relief.
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
## 2015-01-02 ENCOUNTER — Encounter (HOSPITAL_COMMUNITY): Payer: Medicare Other | Admitting: Physical Therapy

## 2015-01-04 ENCOUNTER — Ambulatory Visit (HOSPITAL_COMMUNITY): Payer: Medicare Other | Admitting: Physical Therapy

## 2015-01-04 DIAGNOSIS — M25562 Pain in left knee: Secondary | ICD-10-CM

## 2015-01-04 DIAGNOSIS — M25659 Stiffness of unspecified hip, not elsewhere classified: Secondary | ICD-10-CM | POA: Diagnosis not present

## 2015-01-04 DIAGNOSIS — R531 Weakness: Secondary | ICD-10-CM | POA: Diagnosis not present

## 2015-01-04 DIAGNOSIS — M25662 Stiffness of left knee, not elsewhere classified: Secondary | ICD-10-CM

## 2015-01-04 DIAGNOSIS — R29898 Other symptoms and signs involving the musculoskeletal system: Secondary | ICD-10-CM

## 2015-01-04 DIAGNOSIS — R269 Unspecified abnormalities of gait and mobility: Secondary | ICD-10-CM | POA: Diagnosis not present

## 2015-01-04 DIAGNOSIS — R609 Edema, unspecified: Secondary | ICD-10-CM

## 2015-01-04 DIAGNOSIS — R293 Abnormal posture: Secondary | ICD-10-CM

## 2015-01-04 DIAGNOSIS — Z9889 Other specified postprocedural states: Secondary | ICD-10-CM

## 2015-01-04 DIAGNOSIS — M25462 Effusion, left knee: Secondary | ICD-10-CM | POA: Diagnosis not present

## 2015-01-04 NOTE — Therapy (Signed)
[place], [place], [address] Phone: [phone]   Fax:  [phone]  Physical Therapy Treatment  Patient Details  Name: Shaun Harris MRN: [id number] Date of Birth: October 29, 1965 Referring Provider:  Mikey Kirschner, MD  Encounter Date: 01/04/2015      PT End of Session - 01/04/15 1013    Visit Number 4   Number of Visits 12   Date for PT Re-Evaluation 01/23/15   Authorization Type Medicare    Authorization Time Period 12/26/14 to 02/25/15   Authorization - Visit Number 4   Authorization - Number of Visits 10   PT Start Time 0930   PT Stop Time 1012   PT Time Calculation (min) 42 min   Activity Tolerance Patient tolerated treatment well   Behavior During Therapy [facility] for tasks assessed/performed      Past Medical History  Diagnosis Date  . Hypertension   . Diabetes mellitus   . COPD (chronic obstructive pulmonary disease)   . Anxiety   . Ulcerative colitis   . Ulcerative colitis   . Hypertriglyceridemia   . Venous stasis   . COPD (chronic obstructive pulmonary disease)   . Tobacco abuse   . Hyperlipidemia   . Stroke     pt states,"they arent sure if i had a stroke in 1997, there is still a question about that with my doctors". No deficits.  . Chronic headaches     constant since possible stroke in 1997,"Vasculitis of brain with increased pressure of brain ".  . Sleep apnea     has but doesnt use, cannot tolerate; PCP aware  . Arthritis   . Cerebral vasculitis     Past Surgical History  Procedure Laterality Date  . Cervical spine surgery  2008  . Shoulder arthroscopy  2004    left shoulder  . Shoulder arthroscopy  2008    right shoulder  . Knee arthroscopy  02/25/2012    Procedure: ARTHROSCOPY KNEE;  Surgeon: Sanjuana Kava, MD;  Location: [facility];  Service: Orthopedics;  Laterality: Right;  . Knee arthroscopy with medial menisectomy Left 12/23/2014    Procedure: LEFT KNEE ARTHROSCOPY WITH  PARTIAL MENISECTOMY;  Surgeon: Sanjuana Kava, MD;  Location: [facility];  Service: Orthopedics;  Laterality: Left;    There were no vitals filed for this visit.  Visit Diagnosis:  S/P left knee arthroscopy  Left knee pain  Knee stiffness, left  Edema  Bilateral leg weakness  Abnormality of gait  Hip stiffness, unspecified laterality  Poor posture      Subjective Assessment - 01/04/15 0941    Subjective Pt states his pain is still high at 5/10 in Lt knee.  States his Rt knee also hurts and  plans on having a TKR on Rt when he looses a little more weight.  PT comes in today without AD however with antalgic gait noted.   Currently in Pain? Yes   Pain Score 5    Pain Location Knee   Pain Orientation Left            [facility] PT Assessment - 01/04/15 0001    Assessment   Medical Diagnosis L knee arthroscopy    Onset Date/Surgical Date 12/23/14   Next MD Visit 01/18/2015   Precautions   Precautions Knee   Precaution Comments WBAT                      [facility] Adult PT Treatment/Exercise -  01/04/15 0934    Knee/Hip Exercises: Stretches   Active Hamstring Stretch Left;3 reps;30 seconds   Active Hamstring Stretch Limitations 14" box   Gastroc Stretch 3 reps;30 seconds   Gastroc Stretch Limitations slant boar f    Knee/Hip Exercises: Standing   Heel Raises 15 reps   Heel Raises Limitations toeraises 15 reps   Knee Flexion Both;10 reps   Knee Flexion Limitations 4#   Forward Lunges Both;10 reps   Forward Lunges Limitations 4" step   Side Lunges Both;10 reps   Side Lunges Limitations 4" step   Lateral Step Up Both;10 reps   Forward Step Up 10 reps;Left;Step Height: 8";Both   Functional Squat 10 reps   SLS Rt: 31", Lt: 5" max of 3 no UE's                  PT Short Term Goals - 12/28/14 1109    PT SHORT TERM GOAL #1   Title Patient will experience no more than 4/10 pain in his L knee during all gait based takss and weight bearing functional activities     Time 2   Period Weeks   Status On-going   PT SHORT TERM GOAL #2   Title Patient will demonstrate the ability to safely and efficiently use crutches, both bilateral and unilaterally, to assist with mobility and gait with no falls reported on a consistent basis    Time 2   Period Weeks   Status On-going   PT SHORT TERM GOAL #3   Title Patient will demonstrate bilateral hip ER of at least 35 degrees and bilateral hip IR of at least 30 degrees in order to improve overall mobility and mechanics    Time 2   Period Weeks   Status On-going   PT SHORT TERM GOAL #4   Title Patient will demonstrate at least 4+/5 strength in bilateral lower extremities and at least 4-/5 strength in all proximal musculature    Time 2   Period Weeks   Status On-going   PT SHORT TERM GOAL #5   Title Patient will demonstrate enhanced gait mechanics as evidenced by improved ability to bear weight down through L LE, improved step length R LE, improved stance time L LE, and improved overall gait speed with increased cadence    Time 2   Period Weeks   Status On-going           PT Long Term Goals - 12/28/14 1110    PT LONG TERM GOAL #1   Title Patient will be independent in correctly and consistently performing appropriate HEP, to be updated PRN    Time 4   Period Weeks   Status On-going   PT LONG TERM GOAL #2   Title Patient will experience no more than 1/10 pain in his L knee with all functional weight bearing tasks for an unlimited amount of time with no assistive device    Period Weeks   Status On-going   PT LONG TERM GOAL #3   Title Patient will be able to ambulate unlimited distances and over both even and unven surfaces with no assistive device and pain no more than 1/10 L knee    Time 4   Period Weeks   Status On-going   PT LONG TERM GOAL #4   Title Patient will be able to verbalize the importance of good, correct posture and will be able to maintain proper posture during all functional tasks with  no cues  Time 4   Period Weeks   Status On-going   PT LONG TERM GOAL #5   Title Patient to demonstrate L knee extension of no more than 3 degrees and L knee flexion of at least 120 degrees with pain 0/10   Time 4   Period Weeks   Status On-going               Plan - 01/04/15 1013    Clinical Impression Statement Pt with noted antalgic gait today without use of AD; suggested he continue to use it to prevent back or hip pain.  Noted bowing of legs with Lt worse than Rt.   Added SLS with Rt LE showing 4X stabilitiy of Lt.   PRogressed to forward and lateral lunges today.  Latereal more difficult with decreased ROM acheived.  Able to complete step ups without c/o pain or audible grinding of knee joints.  Pt reports complaince with HEP.    PT Next Visit Plan Progress balance actvities and add forward step downs to increase eccentric strength.  Pt to add SLS to HEP   PT Home Exercise Plan given. No changes.    Consulted and Agree with Plan of Care Patient        Problem List Patient Active Problem List   Diagnosis Date Noted  . Venous stasis ulcer 03/10/2013  . COPD (chronic obstructive pulmonary disease) 03/10/2013  . Tobacco abuse 03/10/2013  . Morbid obesity 03/10/2013  . Diabetes 03/10/2013  . Diabetes mellitus without complication 79/15/0569  . HTN (hypertension) 10/01/2012  . Other and unspecified hyperlipidemia 10/01/2012  . Chronic pain syndrome 10/01/2012  . Knee pain 03/02/2012  . Knee stiffness 03/02/2012    Teena Irani, PTA/CLT [phone]  01/04/2015, 10:16 AM  [place], [place], [address] Phone: [phone]   Fax:  [phone]

## 2015-01-06 ENCOUNTER — Ambulatory Visit (HOSPITAL_COMMUNITY): Payer: Medicare Other

## 2015-01-06 ENCOUNTER — Encounter (HOSPITAL_COMMUNITY): Payer: Self-pay

## 2015-01-06 DIAGNOSIS — R531 Weakness: Secondary | ICD-10-CM | POA: Diagnosis not present

## 2015-01-06 DIAGNOSIS — R609 Edema, unspecified: Secondary | ICD-10-CM

## 2015-01-06 DIAGNOSIS — R269 Unspecified abnormalities of gait and mobility: Secondary | ICD-10-CM | POA: Diagnosis not present

## 2015-01-06 DIAGNOSIS — M25562 Pain in left knee: Secondary | ICD-10-CM

## 2015-01-06 DIAGNOSIS — M25662 Stiffness of left knee, not elsewhere classified: Secondary | ICD-10-CM

## 2015-01-06 DIAGNOSIS — Z9889 Other specified postprocedural states: Secondary | ICD-10-CM

## 2015-01-06 DIAGNOSIS — M25659 Stiffness of unspecified hip, not elsewhere classified: Secondary | ICD-10-CM

## 2015-01-06 DIAGNOSIS — R293 Abnormal posture: Secondary | ICD-10-CM

## 2015-01-06 DIAGNOSIS — R29898 Other symptoms and signs involving the musculoskeletal system: Secondary | ICD-10-CM

## 2015-01-06 DIAGNOSIS — M25462 Effusion, left knee: Secondary | ICD-10-CM | POA: Diagnosis not present
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
## 2015-01-09 ENCOUNTER — Ambulatory Visit (HOSPITAL_COMMUNITY): Payer: Medicare Other | Admitting: Physical Therapy

## 2015-01-09 DIAGNOSIS — M25659 Stiffness of unspecified hip, not elsewhere classified: Secondary | ICD-10-CM

## 2015-01-09 DIAGNOSIS — M25462 Effusion, left knee: Secondary | ICD-10-CM | POA: Diagnosis not present

## 2015-01-09 DIAGNOSIS — M25562 Pain in left knee: Secondary | ICD-10-CM | POA: Diagnosis not present

## 2015-01-09 DIAGNOSIS — Z9889 Other specified postprocedural states: Secondary | ICD-10-CM

## 2015-01-09 DIAGNOSIS — R609 Edema, unspecified: Secondary | ICD-10-CM

## 2015-01-09 DIAGNOSIS — R269 Unspecified abnormalities of gait and mobility: Secondary | ICD-10-CM | POA: Diagnosis not present

## 2015-01-09 DIAGNOSIS — M25662 Stiffness of left knee, not elsewhere classified: Secondary | ICD-10-CM

## 2015-01-09 DIAGNOSIS — R293 Abnormal posture: Secondary | ICD-10-CM

## 2015-01-09 DIAGNOSIS — R29898 Other symptoms and signs involving the musculoskeletal system: Secondary | ICD-10-CM

## 2015-01-09 DIAGNOSIS — R531 Weakness: Secondary | ICD-10-CM | POA: Diagnosis not present

## 2015-01-09 NOTE — Therapy (Signed)
[place], [place], [address] Phone: [phone]   Fax:  [phone]  Physical Therapy Treatment  Patient Details  Name: Shaun Harris MRN: [id number] Date of Birth: 1966/07/08 Referring Provider:  Mikey Kirschner, MD  Encounter Date: 01/09/2015      PT End of Session - 01/09/15 1213    Visit Number 6   Number of Visits 12   Date for PT Re-Evaluation 01/23/15   Authorization Type Medicare    Authorization Time Period 12/26/14 to 02/25/15   Authorization - Visit Number 6   Authorization - Number of Visits 10   PT Start Time [phone]   PT Stop Time 1018   PT Time Calculation (min) 40 min   Activity Tolerance Patient tolerated treatment well  Mild increase in pain c patella mobs.    Behavior During Therapy [facility] for tasks assessed/performed      Past Medical History  Diagnosis Date  . Hypertension   . Diabetes mellitus   . COPD (chronic obstructive pulmonary disease)   . Anxiety   . Ulcerative colitis   . Ulcerative colitis   . Hypertriglyceridemia   . Venous stasis   . COPD (chronic obstructive pulmonary disease)   . Tobacco abuse   . Hyperlipidemia   . Stroke     pt states,"they arent sure if i had a stroke in 1997, there is still a question about that with my doctors". No deficits.  . Chronic headaches     constant since possible stroke in 1997,"Vasculitis of brain with increased pressure of brain ".  . Sleep apnea     has but doesnt use, cannot tolerate; PCP aware  . Arthritis   . Cerebral vasculitis     Past Surgical History  Procedure Laterality Date  . Cervical spine surgery  2008  . Shoulder arthroscopy  2004    left shoulder  . Shoulder arthroscopy  2008    right shoulder  . Knee arthroscopy  02/25/2012    Procedure: ARTHROSCOPY KNEE;  Surgeon: Sanjuana Kava, MD;  Location: [facility];  Service: Orthopedics;  Laterality: Right;  . Knee arthroscopy with medial menisectomy Left 12/23/2014   Procedure: LEFT KNEE ARTHROSCOPY WITH PARTIAL MENISECTOMY;  Surgeon: Sanjuana Kava, MD;  Location: [facility];  Service: Orthopedics;  Laterality: Left;    There were no vitals filed for this visit.  Visit Diagnosis:  S/P left knee arthroscopy  Left knee pain  Knee stiffness, left  Edema  Bilateral leg weakness  Abnormality of gait  Hip stiffness, unspecified laterality  Poor posture      Subjective Assessment - 01/09/15 0942    Subjective Pt states he did nothing over the weekend and knee doesn't feel that bad today, 4/10 pain.    Currently in Pain? Yes   Pain Score 4    Pain Orientation Left                         [facility] Adult PT Treatment/Exercise - 01/09/15 0943    Knee/Hip Exercises: Stretches   Active Hamstring Stretch Left;3 reps;30 seconds   Active Hamstring Stretch Limitations 14" box   Gastroc Stretch 3 reps;30 seconds   Gastroc Stretch Limitations slant boar f    Knee/Hip Exercises: Standing   Heel Raises 15 reps;2 sets   Heel Raises Limitations toeraises  2x15    Forward Lunges Both;15 reps   Forward Lunges Limitations 4" step   Side  Lunges Both;15 reps   Side Lunges Limitations 4" step   Functional Squat 15 reps   SLS Rt: 35", Lt: 8" max of 3 no UE's                  PT Short Term Goals - 12/28/14 1109    PT SHORT TERM GOAL #1   Title Patient will experience no more than 4/10 pain in his L knee during all gait based takss and weight bearing functional activities    Time 2   Period Weeks   Status On-going   PT SHORT TERM GOAL #2   Title Patient will demonstrate the ability to safely and efficiently use crutches, both bilateral and unilaterally, to assist with mobility and gait with no falls reported on a consistent basis    Time 2   Period Weeks   Status On-going   PT SHORT TERM GOAL #3   Title Patient will demonstrate bilateral hip ER of at least 35 degrees and bilateral hip IR of at least 30 degrees in order to improve  overall mobility and mechanics    Time 2   Period Weeks   Status On-going   PT SHORT TERM GOAL #4   Title Patient will demonstrate at least 4+/5 strength in bilateral lower extremities and at least 4-/5 strength in all proximal musculature    Time 2   Period Weeks   Status On-going   PT SHORT TERM GOAL #5   Title Patient will demonstrate enhanced gait mechanics as evidenced by improved ability to bear weight down through L LE, improved step length R LE, improved stance time L LE, and improved overall gait speed with increased cadence    Time 2   Period Weeks   Status On-going           PT Long Term Goals - 12/28/14 1110    PT LONG TERM GOAL #1   Title Patient will be independent in correctly and consistently performing appropriate HEP, to be updated PRN    Time 4   Period Weeks   Status On-going   PT LONG TERM GOAL #2   Title Patient will experience no more than 1/10 pain in his L knee with all functional weight bearing tasks for an unlimited amount of time with no assistive device    Period Weeks   Status On-going   PT LONG TERM GOAL #3   Title Patient will be able to ambulate unlimited distances and over both even and unven surfaces with no assistive device and pain no more than 1/10 L knee    Time 4   Period Weeks   Status On-going   PT LONG TERM GOAL #4   Title Patient will be able to verbalize the importance of good, correct posture and will be able to maintain proper posture during all functional tasks with no cues    Time 4   Period Weeks   Status On-going   PT LONG TERM GOAL #5   Title Patient to demonstrate L knee extension of no more than 3 degrees and L knee flexion of at least 120 degrees with pain 0/10   Time 4   Period Weeks   Status On-going               Plan - 01/09/15 1215    Clinical Impression Statement Continued focus on increasing quad strength and overall function.  Pt continues to have antalgic gait, especially after initially standing.   Decreased  step height to enable patient to complete using 1 HHA rather than 2.  Noted instability with step downs.     PT Next Visit Plan Progress balance actvities next visit.  Re-evaluate X 3 more visits prior to MD appointment.    Consulted and Agree with Plan of Care Patient        Problem List Patient Active Problem List   Diagnosis Date Noted  . Venous stasis ulcer 03/10/2013  . COPD (chronic obstructive pulmonary disease) 03/10/2013  . Tobacco abuse 03/10/2013  . Morbid obesity 03/10/2013  . Diabetes 03/10/2013  . Diabetes mellitus without complication 67/34/1937  . HTN (hypertension) 10/01/2012  . Other and unspecified hyperlipidemia 10/01/2012  . Chronic pain syndrome 10/01/2012  . Knee pain 03/02/2012  . Knee stiffness 03/02/2012    Teena Irani, PTA/CLT [phone]  01/09/2015, 12:22 PM  [place] [address] [place], [place], [address] Phone: [phone]   Fax:  [phone]

## 2015-01-11 ENCOUNTER — Encounter: Payer: Self-pay | Admitting: Family Medicine

## 2015-01-11 ENCOUNTER — Ambulatory Visit (HOSPITAL_COMMUNITY)
Admission: RE | Admit: 2015-01-11 | Discharge: 2015-01-11 | Disposition: A | Discharge status: Home / Self Care | Payer: Medicare Other | Source: Ambulatory Visit | Attending: Family Medicine | Admitting: Family Medicine

## 2015-01-11 ENCOUNTER — Ambulatory Visit (INDEPENDENT_AMBULATORY_CARE_PROVIDER_SITE_OTHER): Payer: Medicare Other | Admitting: Family Medicine

## 2015-01-11 ENCOUNTER — Ambulatory Visit (HOSPITAL_COMMUNITY): Admission: RE | Admit: 2015-01-11 | Payer: Medicare Other | Source: Ambulatory Visit

## 2015-01-11 ENCOUNTER — Ambulatory Visit (HOSPITAL_COMMUNITY): Payer: Medicare Other | Admitting: Physical Therapy

## 2015-01-11 VITALS — BP 126/70 | Temp 97.8°F | Ht 67.0 in | Wt 286.1 lb

## 2015-01-11 DIAGNOSIS — R269 Unspecified abnormalities of gait and mobility: Secondary | ICD-10-CM

## 2015-01-11 DIAGNOSIS — M25662 Stiffness of left knee, not elsewhere classified: Secondary | ICD-10-CM

## 2015-01-11 DIAGNOSIS — R609 Edema, unspecified: Secondary | ICD-10-CM

## 2015-01-11 DIAGNOSIS — R0602 Shortness of breath: Secondary | ICD-10-CM | POA: Diagnosis not present

## 2015-01-11 DIAGNOSIS — Z9889 Other specified postprocedural states: Secondary | ICD-10-CM | POA: Diagnosis not present

## 2015-01-11 DIAGNOSIS — R079 Chest pain, unspecified: Secondary | ICD-10-CM | POA: Diagnosis not present

## 2015-01-11 DIAGNOSIS — M25562 Pain in left knee: Secondary | ICD-10-CM

## 2015-01-11 DIAGNOSIS — M25659 Stiffness of unspecified hip, not elsewhere classified: Secondary | ICD-10-CM | POA: Diagnosis not present

## 2015-01-11 DIAGNOSIS — R531 Weakness: Secondary | ICD-10-CM | POA: Diagnosis not present

## 2015-01-11 DIAGNOSIS — M25462 Effusion, left knee: Secondary | ICD-10-CM | POA: Diagnosis not present

## 2015-01-11 MED ORDER — IOHEXOL 350 MG/ML SOLN
100.0000 mL | Freq: Once | INTRAVENOUS | Status: AC | PRN
  Administered 2015-01-11: 100 mL via INTRAVENOUS
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
## 2015-01-11 NOTE — Therapy (Signed)
[place], [place], [address] Phone: [phone]   Fax:  [phone]  Physical Therapy Treatment  Patient Details  Name: BRIAR WITHERSPOON MRN: [id number] Date of Birth: [birth year] Referring Provider:  Sanjuana Kava, MD  Encounter Date: 01/11/2015      PT End of Session - 01/11/15 1011    Visit Number 7   Number of Visits 12   Date for PT Re-Evaluation 01/23/15   Authorization Type Medicare    Authorization Time Period 12/26/14 to 02/25/15   Authorization - Visit Number 7   Authorization - Number of Visits 10   PT Start Time 0937   PT Stop Time 1018   PT Time Calculation (min) 41 min      Past Medical History  Diagnosis Date  . Hypertension   . Diabetes mellitus   . COPD (chronic obstructive pulmonary disease)   . Anxiety   . Ulcerative colitis   . Ulcerative colitis   . Hypertriglyceridemia   . Venous stasis   . COPD (chronic obstructive pulmonary disease)   . Tobacco abuse   . Hyperlipidemia   . Stroke     pt states,"they arent sure if i had a stroke in 1997, there is still a question about that with my doctors". No deficits.  . Chronic headaches     constant since possible stroke in 1997,"Vasculitis of brain with increased pressure of brain ".  . Sleep apnea     has but doesnt use, cannot tolerate; PCP aware  . Arthritis   . Cerebral vasculitis     Past Surgical History  Procedure Laterality Date  . Cervical spine surgery  2008  . Shoulder arthroscopy  2004    left shoulder  . Shoulder arthroscopy  2008    right shoulder  . Knee arthroscopy  02/25/2012    Procedure: ARTHROSCOPY KNEE;  Surgeon: Sanjuana Kava, MD;  Location: [facility];  Service: Orthopedics;  Laterality: Right;  . Knee arthroscopy with medial menisectomy Left 12/23/2014    Procedure: LEFT KNEE ARTHROSCOPY WITH PARTIAL MENISECTOMY;  Surgeon: Sanjuana Kava, MD;  Location: [facility];  Service: Orthopedics;  Laterality: Left;    There  were no vitals filed for this visit.  Visit Diagnosis:  S/P left knee arthroscopy  Left knee pain  Knee stiffness, left  Edema  Abnormality of gait      Subjective Assessment - 01/11/15 0941    Subjective I think I have a touch of pneumonia; my back is hurting.    Currently in Pain? No/denies  more soreness than pain              [facility] Adult PT Treatment/Exercise - 01/11/15 0001    Knee/Hip Exercises: Stretches   Active Hamstring Stretch Left;60 seconds   Active Hamstring Stretch Limitations 18" step box    Quad Stretch Left;3 reps;30 seconds   Gastroc Stretch 3 reps;30 seconds   Gastroc Stretch Limitations slant boar f    Knee/Hip Exercises: Standing   Forward Lunges Left;15 reps   Forward Lunges Limitations 7" step   Lateral Step Up Left;15 reps;Hand Hold: 1   Forward Step Up Left;15 reps;Hand Hold: 1;Step Height: 4"   Functional Squat 15 reps   Rocker Board 2 minutes   SLS with Vectors 3 x 10"   Knee/Hip Exercises: Supine   Quad Sets 10 reps   Knee Extension Limitations 2 degrees   Knee Flexion Limitations 110  PT Education - 01/11/15 1011    Education provided Yes   Education Details The importance of not smoking   Person(s) Educated Patient   Methods Explanation;Handout   Comprehension Verbalized understanding          PT Short Term Goals - 12/28/14 1109    PT SHORT TERM GOAL #1   Title Patient will experience no more than 4/10 pain in his L knee during all gait based takss and weight bearing functional activities    Time 2   Period Weeks   Status On-going   PT SHORT TERM GOAL #2   Title Patient will demonstrate the ability to safely and efficiently use crutches, both bilateral and unilaterally, to assist with mobility and gait with no falls reported on a consistent basis    Time 2   Period Weeks   Status On-going   PT SHORT TERM GOAL #3   Title Patient will demonstrate bilateral hip ER of at least 35 degrees and  bilateral hip IR of at least 30 degrees in order to improve overall mobility and mechanics    Time 2   Period Weeks   Status On-going   PT SHORT TERM GOAL #4   Title Patient will demonstrate at least 4+/5 strength in bilateral lower extremities and at least 4-/5 strength in all proximal musculature    Time 2   Period Weeks   Status On-going   PT SHORT TERM GOAL #5   Title Patient will demonstrate enhanced gait mechanics as evidenced by improved ability to bear weight down through L LE, improved step length R LE, improved stance time L LE, and improved overall gait speed with increased cadence    Time 2   Period Weeks   Status On-going           PT Long Term Goals - 12/28/14 1110    PT LONG TERM GOAL #1   Title Patient will be independent in correctly and consistently performing appropriate HEP, to be updated PRN    Time 4   Period Weeks   Status On-going   PT LONG TERM GOAL #2   Title Patient will experience no more than 1/10 pain in his L knee with all functional weight bearing tasks for an unlimited amount of time with no assistive device    Period Weeks   Status On-going   PT LONG TERM GOAL #3   Title Patient will be able to ambulate unlimited distances and over both even and unven surfaces with no assistive device and pain no more than 1/10 L knee    Time 4   Period Weeks   Status On-going   PT LONG TERM GOAL #4   Title Patient will be able to verbalize the importance of good, correct posture and will be able to maintain proper posture during all functional tasks with no cues    Time 4   Period Weeks   Status On-going   PT LONG TERM GOAL #5   Title Patient to demonstrate L knee extension of no more than 3 degrees and L knee flexion of at least 120 degrees with pain 0/10   Time 4   Period Weeks   Status On-going               Plan - 01/11/15 1012    Clinical Impression Statement Pt continues to demonstate decreased functional strength with Lt LE although  this is improving.  Pt continues to need to focus on balance and  improving flexion as welll.  All exercises were completed with therapist cuing for proper technique.     Pt will benefit from skilled therapeutic intervention in order to improve on the following deficits Abnormal gait;Decreased endurance;Hypomobility;Increased edema;Obesity;Decreased activity tolerance;Decreased knowledge of use of DME;Decreased strength;Pain;Decreased balance;Decreased mobility;Difficulty walking;Decreased range of motion;Improper body mechanics;Decreased coordination;Decreased safety awareness;Postural dysfunction   Rehab Potential Good   PT Next Visit Plan reassess in 2 more visits         Problem List Patient Active Problem List   Diagnosis Date Noted  . Venous stasis ulcer 03/10/2013  . COPD (chronic obstructive pulmonary disease) 03/10/2013  . Tobacco abuse 03/10/2013  . Morbid obesity 03/10/2013  . Diabetes 03/10/2013  . Diabetes mellitus without complication 03/47/4259  . HTN (hypertension) 10/01/2012  . Other and unspecified hyperlipidemia 10/01/2012  . Chronic pain syndrome 10/01/2012  . Knee pain 03/02/2012  . Knee stiffness 03/02/2012   Rayetta Humphrey, PT CLT [phone] 01/11/2015, 10:17 AM  [place], [place], [address] Phone: [phone]   Fax:  [phone]

## 2015-01-13 ENCOUNTER — Ambulatory Visit (HOSPITAL_COMMUNITY): Payer: Medicare Other | Attending: Orthopaedic Surgery | Admitting: Physical Therapy

## 2015-01-13 DIAGNOSIS — Z9889 Other specified postprocedural states: Secondary | ICD-10-CM

## 2015-01-13 DIAGNOSIS — R269 Unspecified abnormalities of gait and mobility: Secondary | ICD-10-CM

## 2015-01-13 DIAGNOSIS — R29898 Other symptoms and signs involving the musculoskeletal system: Secondary | ICD-10-CM | POA: Diagnosis not present

## 2015-01-13 DIAGNOSIS — M25659 Stiffness of unspecified hip, not elsewhere classified: Secondary | ICD-10-CM | POA: Diagnosis not present

## 2015-01-13 DIAGNOSIS — R609 Edema, unspecified: Secondary | ICD-10-CM | POA: Diagnosis not present

## 2015-01-13 DIAGNOSIS — M25562 Pain in left knee: Secondary | ICD-10-CM

## 2015-01-13 DIAGNOSIS — R293 Abnormal posture: Secondary | ICD-10-CM | POA: Diagnosis not present

## 2015-01-13 DIAGNOSIS — M25662 Stiffness of left knee, not elsewhere classified: Secondary | ICD-10-CM | POA: Diagnosis not present
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
## 2015-01-17 ENCOUNTER — Ambulatory Visit (HOSPITAL_COMMUNITY): Payer: Medicare Other | Admitting: Physical Therapy

## 2015-01-17 DIAGNOSIS — R269 Unspecified abnormalities of gait and mobility: Secondary | ICD-10-CM

## 2015-01-17 DIAGNOSIS — M25562 Pain in left knee: Secondary | ICD-10-CM | POA: Diagnosis not present

## 2015-01-17 DIAGNOSIS — M25662 Stiffness of left knee, not elsewhere classified: Secondary | ICD-10-CM

## 2015-01-17 DIAGNOSIS — R609 Edema, unspecified: Secondary | ICD-10-CM | POA: Diagnosis not present

## 2015-01-17 DIAGNOSIS — Z9889 Other specified postprocedural states: Secondary | ICD-10-CM | POA: Diagnosis not present

## 2015-01-17 DIAGNOSIS — R29898 Other symptoms and signs involving the musculoskeletal system: Secondary | ICD-10-CM | POA: Diagnosis not present
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
## 2015-01-19 ENCOUNTER — Ambulatory Visit (HOSPITAL_COMMUNITY): Payer: Medicare Other

## 2015-01-19 DIAGNOSIS — R29898 Other symptoms and signs involving the musculoskeletal system: Secondary | ICD-10-CM

## 2015-01-19 DIAGNOSIS — R293 Abnormal posture: Secondary | ICD-10-CM

## 2015-01-19 DIAGNOSIS — R269 Unspecified abnormalities of gait and mobility: Secondary | ICD-10-CM | POA: Diagnosis not present

## 2015-01-19 DIAGNOSIS — R609 Edema, unspecified: Secondary | ICD-10-CM | POA: Diagnosis not present

## 2015-01-19 DIAGNOSIS — M25662 Stiffness of left knee, not elsewhere classified: Secondary | ICD-10-CM | POA: Diagnosis not present

## 2015-01-19 DIAGNOSIS — M25659 Stiffness of unspecified hip, not elsewhere classified: Secondary | ICD-10-CM

## 2015-01-19 DIAGNOSIS — M25562 Pain in left knee: Secondary | ICD-10-CM

## 2015-01-19 DIAGNOSIS — Z9889 Other specified postprocedural states: Secondary | ICD-10-CM | POA: Diagnosis not present

## 2015-01-19 NOTE — Therapy (Signed)
[facility] [address] [place], [place], [address] Phone: [phone]   Fax:  [phone]  Physical Therapy Treatment  Patient Details  Name: Shaun Harris MRN: [id number] Date of Birth: 04/27/1966 Referring Provider:  Keeling, Wayne, MD  Encounter Date: 01/19/2015      PT End of Session - 01/19/15 0940    Visit Number 10   Number of Visits 12   Date for PT Re-Evaluation 02/07/15   Authorization Type Medicare    Authorization Time Period 12/26/14 to 02/25/15   Authorization - Visit Number 10   Authorization - Number of Visits 20   PT Start Time 0935   PT Stop Time 1025   PT Time Calculation (min) 50 min   Activity Tolerance Patient limited by pain   Behavior During Therapy WFL for tasks assessed/performed      Past Medical History  Diagnosis Date  . Hypertension   . Diabetes mellitus   . COPD (chronic obstructive pulmonary disease)   . Anxiety   . Ulcerative colitis   . Ulcerative colitis   . Hypertriglyceridemia   . Venous stasis   . COPD (chronic obstructive pulmonary disease)   . Tobacco abuse   . Hyperlipidemia   . Stroke     pt states,"they arent sure if i had a stroke in 1997, there is still a question about that with my doctors". No deficits.  . Chronic headaches     constant since possible stroke in 1997,"Vasculitis of brain with increased pressure of brain ".  . Sleep apnea     has but doesnt use, cannot tolerate; PCP aware  . Arthritis   . Cerebral vasculitis     Past Surgical History  Procedure Laterality Date  . Cervical spine surgery  2008  . Shoulder arthroscopy  2004    left shoulder  . Shoulder arthroscopy  2008    right shoulder  . Knee arthroscopy  02/25/2012    Procedure: ARTHROSCOPY KNEE;  Surgeon: Wayne Keeling, MD;  Location: [facility];  Service: Orthopedics;  Laterality: Right;  . Knee arthroscopy with medial menisectomy Left 12/23/2014    Procedure: LEFT KNEE ARTHROSCOPY WITH PARTIAL  MENISECTOMY;  Surgeon: Wayne Keeling, MD;  Location: [facility];  Service: Orthopedics;  Laterality: Left;    There were no vitals filed for this visit.  Visit Diagnosis:  S/P left knee arthroscopy  Left knee pain  Knee stiffness, left  Abnormality of gait  Edema  Bilateral leg weakness  Hip stiffness, unspecified laterality  Poor posture      Subjective Assessment - 01/19/15 0939    Subjective Pt stated his knee pain is the highest since surgery, pain scale 7-8/10 today   Currently in Pain? Yes   Pain Score 7    Pain Location Knee   Pain Orientation Left   Pain Descriptors / Indicators Aching                     PT Short Term Goals - 01/19/15 0940    PT SHORT TERM GOAL #1   Title Patient will experience no more than 4/10 pain in his L knee during all gait based takss and weight bearing functional activities    Status On-going   PT SHORT TERM GOAL #2   Title Patient will demonstrate the ability to safely and efficiently use crutches, both bilateral and unilaterally, to assist with mobility and gait with no falls reported on a consistent basis      Status Achieved   PT SHORT TERM GOAL #3   Title Patient will demonstrate bilateral hip ER of at least 35 degrees and bilateral hip IR of at least 30 degrees in order to improve overall mobility and mechanics    Status Partially Met   PT SHORT TERM GOAL #4   Title Patient will demonstrate at least 4+/5 strength in bilateral lower extremities and at least 4-/5 strength in all proximal musculature    Status Partially Met   PT SHORT TERM GOAL #5   Title Patient will demonstrate enhanced gait mechanics as evidenced by improved ability to bear weight down through L LE, improved step length R LE, improved stance time L LE, and improved overall gait speed with increased cadence    Status Achieved           PT Long Term Goals - January 27, 2015 0941    PT LONG TERM GOAL #1   Title Patient will be independent in correctly and  consistently performing appropriate HEP, to be updated PRN    Status Achieved   PT LONG TERM GOAL #2   Title Patient will experience no more than 1/10 pain in his L knee with all functional weight bearing tasks for an unlimited amount of time with no assistive device    Status Not Met   PT LONG TERM GOAL #3   Title Patient will be able to ambulate unlimited distances and over both even and unven surfaces with no assistive device and pain no more than 1/10 L knee    PT LONG TERM GOAL #4   Title Patient will be able to verbalize the importance of good, correct posture and will be able to maintain proper posture during all functional tasks with no cues    Status Achieved   PT LONG TERM GOAL #5   Title Patient to demonstrate L knee extension of no more than 3 degrees and L knee flexion of at least 120 degrees with pain 0/10   Status On-going               Plan - Jan 27, 2015 1304    Clinical Impression Statement Pt limited by pain this session.  Session focus on improving gait mechanics, quadricep and gluteal strenghtening and increasing ROM.  Pt with cueing to increase stance phase and equalize stride length to improve gait mechanics.  Stretches and exercises complete to improve knee flexion and functional strenghtening activities for quad and gluteal strenghtening with cueing to improve form and technique with all exercises.  FOTO complete with vast improved perceived functional abilities graded by score (was 74% now 53%)  Ice applied to knee at end of session and encouraged pt to apply more frequently at home for pain and edema control.   PT Next Visit Plan concentrate on increasing knee flexion ROM; knee extension and hip extension strength as well as balance.          G-Codes - 01/27/2015 0109    Functional Assessment Tool Used foto   Functional Limitation Mobility: Walking and moving around   Mobility: Walking and Moving Around Current Status [phone]) At least 40 percent but less than 60  percent impaired, limited or restricted   Mobility: Walking and Moving Around Goal Status [phone]) At least 40 percent but less than 60 percent impaired, limited or restricted      Problem List Patient Active Problem List   Diagnosis Date Noted  . Venous stasis ulcer 03/10/2013  . COPD (chronic obstructive pulmonary disease) 03/10/2013  .  Tobacco abuse 03/10/2013  . Morbid obesity 03/10/2013  . Diabetes 03/10/2013  . Diabetes mellitus without complication 10/01/2012  . HTN (hypertension) 10/01/2012  . Other and unspecified hyperlipidemia 10/01/2012  . Chronic pain syndrome 10/01/2012  . Knee pain 03/02/2012  . Knee stiffness 03/02/2012   Casey Cockerham, LPTA; CBIS [phone]  Cynthia Russell, PT CLT [phone] 01/20/2015, 8:30 AM  [facility] [address] [place], [place], [address] Phone: [phone]   Fax:  [phone]      

## 2015-01-20 ENCOUNTER — Other Ambulatory Visit: Payer: Self-pay | Admitting: Family Medicine

## 2015-01-24 ENCOUNTER — Ambulatory Visit (HOSPITAL_COMMUNITY): Payer: Medicare Other | Admitting: Physical Therapy

## 2015-01-24 DIAGNOSIS — R609 Edema, unspecified: Secondary | ICD-10-CM | POA: Diagnosis not present

## 2015-01-24 DIAGNOSIS — M25662 Stiffness of left knee, not elsewhere classified: Secondary | ICD-10-CM

## 2015-01-24 DIAGNOSIS — R269 Unspecified abnormalities of gait and mobility: Secondary | ICD-10-CM | POA: Diagnosis not present

## 2015-01-24 DIAGNOSIS — Z9889 Other specified postprocedural states: Secondary | ICD-10-CM

## 2015-01-24 DIAGNOSIS — M25562 Pain in left knee: Secondary | ICD-10-CM

## 2015-01-24 DIAGNOSIS — R29898 Other symptoms and signs involving the musculoskeletal system: Secondary | ICD-10-CM

## 2015-01-24 NOTE — Therapy (Signed)
[place], [place], [address] Phone: [phone]   Fax:  [phone]  Physical Therapy Treatment  Patient Details  Name: Shaun Harris MRN: [id number] Date of Birth: 08/01/1965 Referring Provider:  Sanjuana Kava, MD  Encounter Date: 01/24/2015      PT End of Session - 01/24/15 1157    Visit Number 11   Number of Visits 12   Date for PT Re-Evaluation 02/07/15   Authorization Type Medicare    Authorization Time Period 12/26/14 to 02/25/15   Authorization - Visit Number 11   Authorization - Number of Visits 20   PT Start Time 1101   PT Stop Time 1143   PT Time Calculation (min) 42 min   Activity Tolerance Patient tolerated treatment well   Behavior During Therapy [facility] for tasks assessed/performed      Past Medical History  Diagnosis Date  . Hypertension   . Diabetes mellitus   . COPD (chronic obstructive pulmonary disease)   . Anxiety   . Ulcerative colitis   . Ulcerative colitis   . Hypertriglyceridemia   . Venous stasis   . COPD (chronic obstructive pulmonary disease)   . Tobacco abuse   . Hyperlipidemia   . Stroke     pt states,"they arent sure if i had a stroke in 1997, there is still a question about that with my doctors". No deficits.  . Chronic headaches     constant since possible stroke in 1997,"Vasculitis of brain with increased pressure of brain ".  . Sleep apnea     has but doesnt use, cannot tolerate; PCP aware  . Arthritis   . Cerebral vasculitis     Past Surgical History  Procedure Laterality Date  . Cervical spine surgery  2008  . Shoulder arthroscopy  2004    left shoulder  . Shoulder arthroscopy  2008    right shoulder  . Knee arthroscopy  02/25/2012    Procedure: ARTHROSCOPY KNEE;  Surgeon: Sanjuana Kava, MD;  Location: [facility];  Service: Orthopedics;  Laterality: Right;  . Knee arthroscopy with medial menisectomy Left 12/23/2014    Procedure: LEFT KNEE ARTHROSCOPY WITH  PARTIAL MENISECTOMY;  Surgeon: Sanjuana Kava, MD;  Location: [facility];  Service: Orthopedics;  Laterality: Left;    There were no vitals filed for this visit.  Visit Diagnosis:  S/P left knee arthroscopy  Left knee pain  Knee stiffness, left  Abnormality of gait  Bilateral leg weakness      Subjective Assessment - 01/24/15 1103    Subjective Pt reports that his pain has decreased since last session, but it is still sore. Rates pain as 4-5/10.   Currently in Pain? Yes   Pain Score 4    Pain Location Knee   Pain Orientation Left               [facility] Adult PT Treatment/Exercise - 01/24/15 0001    Knee/Hip Exercises: Stretches   Active Hamstring Stretch Both;3 reps;30 seconds   Active Hamstring Stretch Limitations 12 inch box    Gastroc Stretch 3 reps;30 seconds   Gastroc Stretch Limitations slantboard    Knee/Hip Exercises: Standing   Terminal Knee Extension Limitations x15 with green tband   Lateral Step Up Left;10 reps;Step Height: 6"   Forward Step Up Left;10 reps;Step Height: 6"   Wall Squat 10 reps;5 seconds   Rocker Board Limitations x20 R/L, x 20 A/P   Knee/Hip Exercises: Seated   Long Arc  Quad 15 reps;Weights   Long [employer] Weight 5 lbs.   Knee/Hip Exercises: Supine   Short Arc Quad Sets 2 sets;10 reps   Short Arc Quad Sets Limitations 5 pounds                PT Education - 01/24/15 1157    Education provided No          PT Short Term Goals - 01/19/15 0940    PT SHORT TERM GOAL #1   Title Patient will experience no more than 4/10 pain in his L knee during all gait based takss and weight bearing functional activities    Status On-going   PT SHORT TERM GOAL #2   Title Patient will demonstrate the ability to safely and efficiently use crutches, both bilateral and unilaterally, to assist with mobility and gait with no falls reported on a consistent basis    Status Achieved   PT SHORT TERM GOAL #3   Title Patient will demonstrate bilateral hip  ER of at least 35 degrees and bilateral hip IR of at least 30 degrees in order to improve overall mobility and mechanics    Status Partially Met   PT SHORT TERM GOAL #4   Title Patient will demonstrate at least 4+/5 strength in bilateral lower extremities and at least 4-/5 strength in all proximal musculature    Status Partially Met   PT SHORT TERM GOAL #5   Title Patient will demonstrate enhanced gait mechanics as evidenced by improved ability to bear weight down through L LE, improved step length R LE, improved stance time L LE, and improved overall gait speed with increased cadence    Status Achieved           PT Long Term Goals - 01/19/15 0941    PT LONG TERM GOAL #1   Title Patient will be independent in correctly and consistently performing appropriate HEP, to be updated PRN    Status Achieved   PT LONG TERM GOAL #2   Title Patient will experience no more than 1/10 pain in his L knee with all functional weight bearing tasks for an unlimited amount of time with no assistive device    Status Not Met   PT LONG TERM GOAL #3   Title Patient will be able to ambulate unlimited distances and over both even and unven surfaces with no assistive device and pain no more than 1/10 L knee    PT LONG TERM GOAL #4   Title Patient will be able to verbalize the importance of good, correct posture and will be able to maintain proper posture during all functional tasks with no cues    Status Achieved   PT LONG TERM GOAL #5   Title Patient to demonstrate L knee extension of no more than 3 degrees and L knee flexion of at least 120 degrees with pain 0/10   Status On-going               Plan - 01/24/15 1157    Clinical Impression Statement Pt continues to struggle with achieving terminal knee extension with LAQ and SAQ, requiring verbal and tactile cueing to complete. He responded well to wall squats, however, weakness of LLE was evident during the exercise with shaking of the LLE. Pt will  benefit from continued functional strengthening.    PT Next Visit Plan concentrate on increasing knee flexion ROM; knee extension and hip extension strength as well as balance.  Problem List Patient Active Problem List   Diagnosis Date Noted  . Venous stasis ulcer 03/10/2013  . COPD (chronic obstructive pulmonary disease) 03/10/2013  . Tobacco abuse 03/10/2013  . Morbid obesity 03/10/2013  . Diabetes 03/10/2013  . Diabetes mellitus without complication 83/03/4075  . HTN (hypertension) 10/01/2012  . Other and unspecified hyperlipidemia 10/01/2012  . Chronic pain syndrome 10/01/2012  . Knee pain 03/02/2012  . Knee stiffness 03/02/2012    Hilma Favors, PT, DPT [phone] 01/24/2015, 12:00 PM  [facility] [address] [place], [place], [address] Phone: [phone]   Fax:  [phone]

## 2015-01-26 ENCOUNTER — Ambulatory Visit (HOSPITAL_COMMUNITY): Payer: Medicare Other | Admitting: Physical Therapy

## 2015-01-26 DIAGNOSIS — R293 Abnormal posture: Secondary | ICD-10-CM

## 2015-01-26 DIAGNOSIS — R29898 Other symptoms and signs involving the musculoskeletal system: Secondary | ICD-10-CM

## 2015-01-26 DIAGNOSIS — M25562 Pain in left knee: Secondary | ICD-10-CM | POA: Diagnosis not present

## 2015-01-26 DIAGNOSIS — M25662 Stiffness of left knee, not elsewhere classified: Secondary | ICD-10-CM

## 2015-01-26 DIAGNOSIS — R269 Unspecified abnormalities of gait and mobility: Secondary | ICD-10-CM | POA: Diagnosis not present

## 2015-01-26 DIAGNOSIS — R609 Edema, unspecified: Secondary | ICD-10-CM | POA: Diagnosis not present

## 2015-01-26 DIAGNOSIS — Z9889 Other specified postprocedural states: Secondary | ICD-10-CM

## 2015-01-26 DIAGNOSIS — M25659 Stiffness of unspecified hip, not elsewhere classified: Secondary | ICD-10-CM
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
## 2015-01-30 ENCOUNTER — Other Ambulatory Visit: Payer: Self-pay | Admitting: *Deleted

## 2015-01-30 MED ORDER — MORPHINE SULFATE ER 30 MG PO TBCR: 30.0000 mg | EXTENDED_RELEASE_TABLET | Freq: Two times a day (BID) | ORAL | Status: DC

## 2015-02-01 ENCOUNTER — Ambulatory Visit (HOSPITAL_COMMUNITY): Payer: Medicare Other | Admitting: Physical Therapy

## 2015-02-01 DIAGNOSIS — Z9889 Other specified postprocedural states: Secondary | ICD-10-CM | POA: Diagnosis not present

## 2015-02-01 DIAGNOSIS — R269 Unspecified abnormalities of gait and mobility: Secondary | ICD-10-CM | POA: Diagnosis not present

## 2015-02-01 DIAGNOSIS — M25662 Stiffness of left knee, not elsewhere classified: Secondary | ICD-10-CM

## 2015-02-01 DIAGNOSIS — M25562 Pain in left knee: Secondary | ICD-10-CM | POA: Diagnosis not present

## 2015-02-01 DIAGNOSIS — R29898 Other symptoms and signs involving the musculoskeletal system: Secondary | ICD-10-CM | POA: Diagnosis not present

## 2015-02-01 DIAGNOSIS — R293 Abnormal posture: Secondary | ICD-10-CM

## 2015-02-01 DIAGNOSIS — R609 Edema, unspecified: Secondary | ICD-10-CM | POA: Diagnosis not present

## 2015-02-01 DIAGNOSIS — M25659 Stiffness of unspecified hip, not elsewhere classified: Secondary | ICD-10-CM
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
## 2015-02-03 ENCOUNTER — Ambulatory Visit (HOSPITAL_COMMUNITY): Payer: Medicare Other | Admitting: Physical Therapy

## 2015-02-03 DIAGNOSIS — R29898 Other symptoms and signs involving the musculoskeletal system: Secondary | ICD-10-CM | POA: Diagnosis not present

## 2015-02-03 DIAGNOSIS — Z9889 Other specified postprocedural states: Secondary | ICD-10-CM | POA: Diagnosis not present

## 2015-02-03 DIAGNOSIS — M25662 Stiffness of left knee, not elsewhere classified: Secondary | ICD-10-CM

## 2015-02-03 DIAGNOSIS — M25562 Pain in left knee: Secondary | ICD-10-CM

## 2015-02-03 DIAGNOSIS — R269 Unspecified abnormalities of gait and mobility: Secondary | ICD-10-CM | POA: Diagnosis not present

## 2015-02-03 DIAGNOSIS — R609 Edema, unspecified: Secondary | ICD-10-CM | POA: Diagnosis not present

## 2015-02-03 NOTE — Therapy (Signed)
[place], [place], [address] Phone: [phone]   Fax:  [phone]  Physical Therapy Treatment  Patient Details  Name: Shaun Harris MRN: [id number] Date of Birth: Sep 17, 1965 Referring Provider:  Sanjuana Kava, MD  Encounter Date: 02/03/2015      PT End of Session - 02/03/15 1552    Visit Number 14   Number of Visits 24   Date for PT Re-Evaluation 03/01/15   Authorization Type Medicare    Authorization - Visit Number 14   Authorization - Number of Visits 20   PT Start Time 1400   PT Stop Time 7001   PT Time Calculation (min) 47 min   Activity Tolerance Patient tolerated treatment well      Past Medical History  Diagnosis Date  . Hypertension   . Diabetes mellitus   . COPD (chronic obstructive pulmonary disease)   . Anxiety   . Ulcerative colitis   . Ulcerative colitis   . Hypertriglyceridemia   . Venous stasis   . COPD (chronic obstructive pulmonary disease)   . Tobacco abuse   . Hyperlipidemia   . Stroke     pt states,"they arent sure if i had a stroke in 1997, there is still a question about that with my doctors". No deficits.  . Chronic headaches     constant since possible stroke in 1997,"Vasculitis of brain with increased pressure of brain ".  . Sleep apnea     has but doesnt use, cannot tolerate; PCP aware  . Arthritis   . Cerebral vasculitis     Past Surgical History  Procedure Laterality Date  . Cervical spine surgery  2008  . Shoulder arthroscopy  2004    left shoulder  . Shoulder arthroscopy  2008    right shoulder  . Knee arthroscopy  02/25/2012    Procedure: ARTHROSCOPY KNEE;  Surgeon: Sanjuana Kava, MD;  Location: [facility];  Service: Orthopedics;  Laterality: Right;  . Knee arthroscopy with medial menisectomy Left 12/23/2014    Procedure: LEFT KNEE ARTHROSCOPY WITH PARTIAL MENISECTOMY;  Surgeon: Sanjuana Kava, MD;  Location: [facility];  Service: Orthopedics;  Laterality: Left;     There were no vitals filed for this visit.  Visit Diagnosis:  S/P left knee arthroscopy  Left knee pain  Knee stiffness, left  Abnormality of gait  Edema  Bilateral leg weakness      Subjective Assessment - 02/03/15 1400    Subjective Pt got on his riding lawn mower and his knee is very sore today but no pain   Currently in Pain? No/denies               [facility] Adult PT Treatment/Exercise - 02/03/15 0001    Knee/Hip Exercises: Aerobic   Stationary Bike x10" to increase flexion   Knee/Hip Exercises: Machines for Strengthening   Cybex Knee Extension 4 Pl x 10    Cybex Knee Flexion 5 pl x 10    Cybex Leg Press 6 Pl x 10   Knee/Hip Exercises: Standing   Heel Raises 10 reps   Heel Raises Limitations Lt only   Forward Lunges 15 reps   Forward Lunges Limitations 4' step   Functional Squat 10 reps   Wall Squat 10 reps   SLS with Vectors 3 x 10"   Knee/Hip Exercises: Seated   Sit to Sand 10 reps  PT Short Term Goals - 02/01/15 0919    PT SHORT TERM GOAL #1   Title Patient will experience no more than 4/10 pain in his L knee during all gait based takss and weight bearing functional activities    Baseline 7/20- getting close to meeting goal    Time 2   Period Weeks   Status On-going   PT SHORT TERM GOAL #2   Title Patient will demonstrate the ability to safely and efficiently use crutches, both bilateral and unilaterally, to assist with mobility and gait with no falls reported on a consistent basis    Baseline 01/17/2015:  No assistive device being used    Time 2   Period Weeks   Status Achieved   PT SHORT TERM GOAL #3   Title Patient will demonstrate bilateral hip ER of at least 35 degrees and bilateral hip IR of at least 30 degrees in order to improve overall mobility and mechanics    Time 2   Period Weeks   Status Partially Met   PT SHORT TERM GOAL #4   Title Patient will demonstrate at least 4+/5 strength in bilateral lower  extremities and at least 4-/5 strength in all proximal musculature    Baseline  01/17/2015:  knee extension is still lacking    Time 2   Period Weeks   Status Partially Met   PT SHORT TERM GOAL #5   Title Patient will demonstrate enhanced gait mechanics as evidenced by improved ability to bear weight down through L LE, improved step length R LE, improved stance time L LE, and improved overall gait speed with increased cadence    Time 2   Period Weeks   Status Achieved           PT Long Term Goals - 02/01/15 5284    PT LONG TERM GOAL #1   Title Patient will be independent in correctly and consistently performing appropriate HEP, to be updated PRN    Time 4   Period Weeks   Status Achieved   PT LONG TERM GOAL #2   Title Patient will experience no more than 1/10 pain in his L knee with all functional weight bearing tasks for an unlimited amount of time with no assistive device    Time 4   Period Weeks   Status On-going   PT LONG TERM GOAL #3   Title Patient will be able to ambulate unlimited distances and over both even and unven surfaces with no assistive device and pain no more than 1/10 L knee    Time 4   Period Weeks   Status Partially Met   PT LONG TERM GOAL #4   Title Patient will be able to verbalize the importance of good, correct posture and will be able to maintain proper posture during all functional tasks with no cues    Time 4   Period Weeks   Status Achieved   PT LONG TERM GOAL #5   Title Patient to demonstrate L knee extension of no more than 3 degrees and L knee flexion of at least 120 degrees with pain 0/10   Baseline 7/20- 0 to 108   Time 4   Period Weeks   Status On-going   Additional Long Term Goals   Additional Long Term Goals Yes   PT LONG TERM GOAL #6   Title Patient will be able to walk downhill for unlimited amounts of time with pain no more than 2/10 pain in L knee  Time 4   Period Weeks   Status New               Plan - 02/03/15 1552     Clinical Impression Statement Noted mm fatigue in Lt LE with exercises.  Pt continues to have deficits in ROM, strength and balance.  Added strengthening machines to pt program with minimal difficulty.  Pt performing exercises with decreased verbal cuing.    PT Next Visit Plan Continue progressing pt begin quad stretching for increased ROM next treatemnt.        Problem List Patient Active Problem List   Diagnosis Date Noted  . Venous stasis ulcer 03/10/2013  . COPD (chronic obstructive pulmonary disease) 03/10/2013  . Tobacco abuse 03/10/2013  . Morbid obesity 03/10/2013  . Diabetes 03/10/2013  . Diabetes mellitus without complication 46/19/0122  . HTN (hypertension) 10/01/2012  . Other and unspecified hyperlipidemia 10/01/2012  . Chronic pain syndrome 10/01/2012  . Knee pain 03/02/2012  . Knee stiffness 03/02/2012   Rayetta Humphrey, PT CLT [phone] 02/03/2015, 3:55 PM  [place] [address] [place], [place], [address] Phone: [phone]   Fax:  [phone]

## 2015-02-07 ENCOUNTER — Other Ambulatory Visit: Payer: Self-pay | Admitting: Family Medicine

## 2015-02-07 ENCOUNTER — Ambulatory Visit (HOSPITAL_COMMUNITY): Payer: Medicare Other | Admitting: Physical Therapy

## 2015-02-07 DIAGNOSIS — Z9889 Other specified postprocedural states: Secondary | ICD-10-CM | POA: Diagnosis not present

## 2015-02-07 DIAGNOSIS — R269 Unspecified abnormalities of gait and mobility: Secondary | ICD-10-CM

## 2015-02-07 DIAGNOSIS — M25562 Pain in left knee: Secondary | ICD-10-CM | POA: Diagnosis not present

## 2015-02-07 DIAGNOSIS — R29898 Other symptoms and signs involving the musculoskeletal system: Secondary | ICD-10-CM

## 2015-02-07 DIAGNOSIS — R609 Edema, unspecified: Secondary | ICD-10-CM | POA: Diagnosis not present

## 2015-02-07 DIAGNOSIS — M25662 Stiffness of left knee, not elsewhere classified: Secondary | ICD-10-CM | POA: Diagnosis not present

## 2015-02-07 NOTE — Therapy (Signed)
[place], [place], [address] Phone: [phone]   Fax:  [phone]  Physical Therapy Treatment  Patient Details  Name: Shaun Harris MRN: [id number] Date of Birth: 12/19/1965 Referring Provider:  Sanjuana Kava, MD  Encounter Date: 02/07/2015      PT End of Session - 02/07/15 0845    Visit Number 15   Number of Visits 24   Date for PT Re-Evaluation 03/01/15   Authorization Type Medicare    Authorization Time Period 2015-01-17 to 03/19/2015; G-codes done 10th session    Authorization - Visit Number 15   Authorization - Number of Visits 20   PT Start Time 0809   PT Stop Time 0853   PT Time Calculation (min) 44 min   Activity Tolerance Patient tolerated treatment well   Behavior During Therapy [facility] for tasks assessed/performed      Past Medical History  Diagnosis Date  . Hypertension   . Diabetes mellitus   . COPD (chronic obstructive pulmonary disease)   . Anxiety   . Ulcerative colitis   . Ulcerative colitis   . Hypertriglyceridemia   . Venous stasis   . COPD (chronic obstructive pulmonary disease)   . Tobacco abuse   . Hyperlipidemia   . Stroke     pt states,"they arent sure if i had a stroke in 1997, there is still a question about that with my doctors". No deficits.  . Chronic headaches     constant since possible stroke in 1997,"Vasculitis of brain with increased pressure of brain ".  . Sleep apnea     has but doesnt use, cannot tolerate; PCP aware  . Arthritis   . Cerebral vasculitis     Past Surgical History  Procedure Laterality Date  . Cervical spine surgery  2008  . Shoulder arthroscopy  2004    left shoulder  . Shoulder arthroscopy  2008    right shoulder  . Knee arthroscopy  03/18/2012    Procedure: ARTHROSCOPY KNEE;  Surgeon: Sanjuana Kava, MD;  Location: [facility];  Service: Orthopedics;  Laterality: Right;  . Knee arthroscopy with medial menisectomy Left 12/23/2014    Procedure: LEFT  KNEE ARTHROSCOPY WITH PARTIAL MENISECTOMY;  Surgeon: Sanjuana Kava, MD;  Location: [facility];  Service: Orthopedics;  Laterality: Left;    There were no vitals filed for this visit.  Visit Diagnosis:  S/P left knee arthroscopy  Left knee pain  Knee stiffness, left  Abnormality of gait  Bilateral leg weakness      Subjective Assessment - 02/07/15 0810    Subjective Pt reports that his pain is the worst when he first wakes up, so he is having some increased pain at this morning's treatment session.    Pain Score 4    Pain Location Knee   Pain Orientation Left               [facility] Adult PT Treatment/Exercise - 02/07/15 0001    Knee/Hip Exercises: Stretches   Active Hamstring Stretch Both;3 reps;30 seconds   Active Hamstring Stretch Limitations 14 inch box, 3 way    Quad Stretch 3 reps;30 seconds;Left   Quad Stretch Limitations prone    Knee: Self-Stretch to increase Flexion 10 seconds   Knee: Self-Stretch Limitations 10 reps at 12" step   Gastroc Stretch 3 reps;30 seconds   Gastroc Stretch Limitations slantboard    Knee/Hip Exercises: Aerobic   Stationary Bike 10' to increase flexion   Knee/Hip Exercises: Machines  for Strengthening   Cybex Knee Extension 4 Pl x 10    Cybex Knee Flexion 5 pl x 10    Knee/Hip Exercises: Standing   Heel Raises 10 reps   Heel Raises Limitations Lt only   Forward Lunges 15 reps   Forward Lunges Limitations 4' step   Step Down 15 reps;Step Height: 2";Left   Wall Squat 10 reps   SLS x4, 10" max                PT Education - 02/07/15 0845    Education provided No          PT Short Term Goals - 02/01/15 0919    PT SHORT TERM GOAL #1   Title Patient will experience no more than 4/10 pain in his L knee during all gait based takss and weight bearing functional activities    Baseline 7/20- getting close to meeting goal    Time 2   Period Weeks   Status On-going   PT SHORT TERM GOAL #2   Title Patient will demonstrate the  ability to safely and efficiently use crutches, both bilateral and unilaterally, to assist with mobility and gait with no falls reported on a consistent basis    Baseline 01/17/2015:  No assistive device being used    Time 2   Period Weeks   Status Achieved   PT SHORT TERM GOAL #3   Title Patient will demonstrate bilateral hip ER of at least 35 degrees and bilateral hip IR of at least 30 degrees in order to improve overall mobility and mechanics    Time 2   Period Weeks   Status Partially Met   PT SHORT TERM GOAL #4   Title Patient will demonstrate at least 4+/5 strength in bilateral lower extremities and at least 4-/5 strength in all proximal musculature    Baseline  01/17/2015:  knee extension is still lacking    Time 2   Period Weeks   Status Partially Met   PT SHORT TERM GOAL #5   Title Patient will demonstrate enhanced gait mechanics as evidenced by improved ability to bear weight down through L LE, improved step length R LE, improved stance time L LE, and improved overall gait speed with increased cadence    Time 2   Period Weeks   Status Achieved           PT Long Term Goals - 02/01/15 1962    PT LONG TERM GOAL #1   Title Patient will be independent in correctly and consistently performing appropriate HEP, to be updated PRN    Time 4   Period Weeks   Status Achieved   PT LONG TERM GOAL #2   Title Patient will experience no more than 1/10 pain in his L knee with all functional weight bearing tasks for an unlimited amount of time with no assistive device    Time 4   Period Weeks   Status On-going   PT LONG TERM GOAL #3   Title Patient will be able to ambulate unlimited distances and over both even and unven surfaces with no assistive device and pain no more than 1/10 L knee    Time 4   Period Weeks   Status Partially Met   PT LONG TERM GOAL #4   Title Patient will be able to verbalize the importance of good, correct posture and will be able to maintain proper posture  during all functional tasks with no cues  Time 4   Period Weeks   Status Achieved   PT LONG TERM GOAL #5   Title Patient to demonstrate L knee extension of no more than 3 degrees and L knee flexion of at least 120 degrees with pain 0/10   Baseline 7/20- 0 to 108   Time 4   Period Weeks   Status On-going   Additional Long Term Goals   Additional Long Term Goals Yes   PT LONG TERM GOAL #6   Title Patient will be able to walk downhill for unlimited amounts of time with pain no more than 2/10 pain in L knee    Time 4   Period Weeks   Status New               Plan - 02/07/15 0845    Clinical Impression Statement Treatment focused on increasing quad strength and L knee ROM. He demonstrated visible fatigue of LLE with cybex machines and wall squats. Pt continues to demonstrate decreased knee flexion ROM.    PT Next Visit Plan Continue with quad strengthening        Problem List Patient Active Problem List   Diagnosis Date Noted  . Venous stasis ulcer 03/10/2013  . COPD (chronic obstructive pulmonary disease) 03/10/2013  . Tobacco abuse 03/10/2013  . Morbid obesity 03/10/2013  . Diabetes 03/10/2013  . Diabetes mellitus without complication 73/34/4830  . HTN (hypertension) 10/01/2012  . Other and unspecified hyperlipidemia 10/01/2012  . Chronic pain syndrome 10/01/2012  . Knee pain 03/02/2012  . Knee stiffness 03/02/2012    Hilma Favors, PT, DPT [phone] 02/07/2015, 8:47 AM  [place] [address] [place], [place], [address] Phone: [phone]   Fax:  [phone]

## 2015-02-09 ENCOUNTER — Ambulatory Visit (HOSPITAL_COMMUNITY): Payer: Medicare Other | Admitting: Physical Therapy

## 2015-02-09 DIAGNOSIS — M25662 Stiffness of left knee, not elsewhere classified: Secondary | ICD-10-CM | POA: Diagnosis not present

## 2015-02-09 DIAGNOSIS — R269 Unspecified abnormalities of gait and mobility: Secondary | ICD-10-CM | POA: Diagnosis not present

## 2015-02-09 DIAGNOSIS — M25562 Pain in left knee: Secondary | ICD-10-CM | POA: Diagnosis not present

## 2015-02-09 DIAGNOSIS — R29898 Other symptoms and signs involving the musculoskeletal system: Secondary | ICD-10-CM

## 2015-02-09 DIAGNOSIS — Z9889 Other specified postprocedural states: Secondary | ICD-10-CM | POA: Diagnosis not present

## 2015-02-09 DIAGNOSIS — R609 Edema, unspecified: Secondary | ICD-10-CM | POA: Diagnosis not present

## 2015-02-09 NOTE — Therapy (Signed)
[place], [place], [address] Phone: [phone]   Fax:  [phone]  Physical Therapy Treatment  Patient Details  Name: Shaun Harris MRN: [id number] Date of Birth: [birth year] Referring Provider:  Sanjuana Kava, MD  Encounter Date: 02/09/2015      PT End of Session - 02/09/15 0928    Visit Number 16   Number of Visits 24   Date for PT Re-Evaluation 03/01/15   Authorization Type Medicare    Authorization Time Period 01/05/2015 to 2015-03-07; G-codes done 10th session    Authorization - Visit Number 16   Authorization - Number of Visits 20   PT Start Time 0846   PT Stop Time 0932   PT Time Calculation (min) 46 min   Activity Tolerance Patient tolerated treatment well   Behavior During Therapy [facility] for tasks assessed/performed      Past Medical History  Diagnosis Date  . Hypertension   . Diabetes mellitus   . COPD (chronic obstructive pulmonary disease)   . Anxiety   . Ulcerative colitis   . Ulcerative colitis   . Hypertriglyceridemia   . Venous stasis   . COPD (chronic obstructive pulmonary disease)   . Tobacco abuse   . Hyperlipidemia   . Stroke     pt states,"they arent sure if i had a stroke in 1997, there is still a question about that with my doctors". No deficits.  . Chronic headaches     constant since possible stroke in 1997,"Vasculitis of brain with increased pressure of brain ".  . Sleep apnea     has but doesnt use, cannot tolerate; PCP aware  . Arthritis   . Cerebral vasculitis     Past Surgical History  Procedure Laterality Date  . Cervical spine surgery  2008  . Shoulder arthroscopy  2004    left shoulder  . Shoulder arthroscopy  2008    right shoulder  . Knee arthroscopy  03-06-12    Procedure: ARTHROSCOPY KNEE;  Surgeon: Sanjuana Kava, MD;  Location: [facility];  Service: Orthopedics;  Laterality: Right;  . Knee arthroscopy with medial menisectomy Left 12/23/2014    Procedure: LEFT  KNEE ARTHROSCOPY WITH PARTIAL MENISECTOMY;  Surgeon: Sanjuana Kava, MD;  Location: [facility];  Service: Orthopedics;  Laterality: Left;    There were no vitals filed for this visit.  Visit Diagnosis:  S/P left knee arthroscopy  Left knee pain  Knee stiffness, left  Abnormality of gait  Bilateral leg weakness      Subjective Assessment - 02/09/15 0849    Subjective Pt reports that his whole leg is sore this morning, and he states that he didn't do anything yesterday to increase his pain. He rates pain as a 6/10 today.    Currently in Pain? Yes   Pain Score 6    Pain Location Leg   Pain Orientation Left                         [facility] Adult PT Treatment/Exercise - 02/09/15 0001    Knee/Hip Exercises: Stretches   Active Hamstring Stretch Both;3 reps;30 seconds   Active Hamstring Stretch Limitations 14 inch box, 3 way    Quad Stretch 3 reps;30 seconds;Left   Quad Stretch Limitations prone    Knee: Self-Stretch to increase Flexion 10 seconds   Knee: Self-Stretch Limitations 10 reps at 12" step   Gastroc Stretch 3 reps;30 seconds   Gastroc Stretch  Limitations slantboard    Knee/Hip Exercises: Aerobic   Stationary Bike 10' to increase flexion   Knee/Hip Exercises: Machines for Strengthening   Cybex Knee Extension 5 pl x 10   Cybex Knee Flexion 5 pl x 10    Knee/Hip Exercises: Standing   Heel Raises 15 reps   Heel Raises Limitations Lt only   Forward Lunges 15 reps   Forward Lunges Limitations 4' step   Step Down 15 reps;Step Height: 2";Left   Other Standing Knee Exercises Eccentric sit to stands 1x10 with 3 second hold    Other Standing Knee Exercises sidestepping with red tband x 2 RT                  PT Short Term Goals - 02/01/15 0919    PT SHORT TERM GOAL #1   Title Patient will experience no more than 4/10 pain in his L knee during all gait based takss and weight bearing functional activities    Baseline 7/20- getting close to meeting goal     Time 2   Period Weeks   Status On-going   PT SHORT TERM GOAL #2   Title Patient will demonstrate the ability to safely and efficiently use crutches, both bilateral and unilaterally, to assist with mobility and gait with no falls reported on a consistent basis    Baseline 01/17/2015:  No assistive device being used    Time 2   Period Weeks   Status Achieved   PT SHORT TERM GOAL #3   Title Patient will demonstrate bilateral hip ER of at least 35 degrees and bilateral hip IR of at least 30 degrees in order to improve overall mobility and mechanics    Time 2   Period Weeks   Status Partially Met   PT SHORT TERM GOAL #4   Title Patient will demonstrate at least 4+/5 strength in bilateral lower extremities and at least 4-/5 strength in all proximal musculature    Baseline  01/17/2015:  knee extension is still lacking    Time 2   Period Weeks   Status Partially Met   PT SHORT TERM GOAL #5   Title Patient will demonstrate enhanced gait mechanics as evidenced by improved ability to bear weight down through L LE, improved step length R LE, improved stance time L LE, and improved overall gait speed with increased cadence    Time 2   Period Weeks   Status Achieved           PT Long Term Goals - 02/01/15 1443    PT LONG TERM GOAL #1   Title Patient will be independent in correctly and consistently performing appropriate HEP, to be updated PRN    Time 4   Period Weeks   Status Achieved   PT LONG TERM GOAL #2   Title Patient will experience no more than 1/10 pain in his L knee with all functional weight bearing tasks for an unlimited amount of time with no assistive device    Time 4   Period Weeks   Status On-going   PT LONG TERM GOAL #3   Title Patient will be able to ambulate unlimited distances and over both even and unven surfaces with no assistive device and pain no more than 1/10 L knee    Time 4   Period Weeks   Status Partially Met   PT LONG TERM GOAL #4   Title Patient will be  able to verbalize the importance of good,  correct posture and will be able to maintain proper posture during all functional tasks with no cues    Time 4   Period Weeks   Status Achieved   PT LONG TERM GOAL #5   Title Patient to demonstrate L knee extension of no more than 3 degrees and L knee flexion of at least 120 degrees with pain 0/10   Baseline 7/20- 0 to 108   Time 4   Period Weeks   Status On-going   Additional Long Term Goals   Additional Long Term Goals Yes   PT LONG TERM GOAL #6   Title Patient will be able to walk downhill for unlimited amounts of time with pain no more than 2/10 pain in L knee    Time 4   Period Weeks   Status New               Plan - 02/09/15 9802    Clinical Impression Statement Pt continues to demonstrate weakness of LLE, evidenced by shaking when completing wall squats, heel raises, and machines. He will benefit from continued strengthening of L quad, abductors, and hamstrings to improve stability of L knee.    PT Next Visit Plan Continue with functional strengthening, single leg activities        Problem List Patient Active Problem List   Diagnosis Date Noted  . Venous stasis ulcer 03/10/2013  . COPD (chronic obstructive pulmonary disease) 03/10/2013  . Tobacco abuse 03/10/2013  . Morbid obesity 03/10/2013  . Diabetes 03/10/2013  . Diabetes mellitus without complication 21/79/8102  . HTN (hypertension) 10/01/2012  . Other and unspecified hyperlipidemia 10/01/2012  . Chronic pain syndrome 10/01/2012  . Knee pain 03/02/2012  . Knee stiffness 03/02/2012    Hilma Favors, PT, DPT [phone] 02/09/2015, 9:30 AM  [facility] [address] [place], [place], [address] Phone: [phone]   Fax:  [phone]

## 2015-02-20 ENCOUNTER — Other Ambulatory Visit: Payer: Self-pay | Admitting: Family Medicine

## 2015-02-20 ENCOUNTER — Ambulatory Visit (HOSPITAL_COMMUNITY): Payer: Medicare Other | Attending: Orthopaedic Surgery | Admitting: Physical Therapy

## 2015-02-20 DIAGNOSIS — R29898 Other symptoms and signs involving the musculoskeletal system: Secondary | ICD-10-CM

## 2015-02-20 DIAGNOSIS — R269 Unspecified abnormalities of gait and mobility: Secondary | ICD-10-CM

## 2015-02-20 DIAGNOSIS — R609 Edema, unspecified: Secondary | ICD-10-CM | POA: Insufficient documentation

## 2015-02-20 DIAGNOSIS — M25662 Stiffness of left knee, not elsewhere classified: Secondary | ICD-10-CM | POA: Diagnosis not present

## 2015-02-20 DIAGNOSIS — R293 Abnormal posture: Secondary | ICD-10-CM | POA: Insufficient documentation

## 2015-02-20 DIAGNOSIS — M25659 Stiffness of unspecified hip, not elsewhere classified: Secondary | ICD-10-CM | POA: Diagnosis not present

## 2015-02-20 DIAGNOSIS — Z9889 Other specified postprocedural states: Secondary | ICD-10-CM

## 2015-02-20 DIAGNOSIS — M25562 Pain in left knee: Secondary | ICD-10-CM | POA: Diagnosis not present

## 2015-02-20 NOTE — Therapy (Signed)
[place], [place], [address] Phone: [phone]   Fax:  [phone]  Physical Therapy Treatment  Patient Details  Name: Shaun Harris MRN: [id number] Date of Birth: 1966/01/26 Referring Provider:  Sanjuana Kava, MD  Encounter Date: 02/20/2015      PT End of Session - 02/20/15 0923    Visit Number 17   Number of Visits 24   Date for PT Re-Evaluation 03-02-2015   Authorization Type Medicare    Authorization Time Period 12-31-14 to Mar 02, 2015; G-codes done 10th session    Authorization - Visit Number 17   Authorization - Number of Visits 20   PT Start Time 0845   PT Stop Time 0931   PT Time Calculation (min) 46 min   Activity Tolerance Patient tolerated treatment well   Behavior During Therapy [facility] for tasks assessed/performed      Past Medical History  Diagnosis Date  . Hypertension   . Diabetes mellitus   . COPD (chronic obstructive pulmonary disease)   . Anxiety   . Ulcerative colitis   . Ulcerative colitis   . Hypertriglyceridemia   . Venous stasis   . COPD (chronic obstructive pulmonary disease)   . Tobacco abuse   . Hyperlipidemia   . Stroke     pt states,"they arent sure if i had a stroke in 1997, there is still a question about that with my doctors". No deficits.  . Chronic headaches     constant since possible stroke in 1997,"Vasculitis of brain with increased pressure of brain ".  . Sleep apnea     has but doesnt use, cannot tolerate; PCP aware  . Arthritis   . Cerebral vasculitis     Past Surgical History  Procedure Laterality Date  . Cervical spine surgery  2008  . Shoulder arthroscopy  2004    left shoulder  . Shoulder arthroscopy  2008    right shoulder  . Knee arthroscopy  03/01/2012    Procedure: ARTHROSCOPY KNEE;  Surgeon: Sanjuana Kava, MD;  Location: [facility];  Service: Orthopedics;  Laterality: Right;  . Knee arthroscopy with medial menisectomy Left 12/23/2014    Procedure: LEFT  KNEE ARTHROSCOPY WITH PARTIAL MENISECTOMY;  Surgeon: Sanjuana Kava, MD;  Location: [facility];  Service: Orthopedics;  Laterality: Left;    There were no vitals filed for this visit.  Visit Diagnosis:  S/P left knee arthroscopy  Left knee pain  Knee stiffness, left  Abnormality of gait  Bilateral leg weakness      Subjective Assessment - 02/20/15 0848    Subjective Pt reports that his pain level is a 4-5/10 today. He reports that he had trouble walking on the sand when he was at the beach, and it was also hard to maintain his balance while he was out on the boat.    Currently in Pain? Yes   Pain Score 4    Pain Location Knee   Pain Orientation Left              [facility] Adult PT Treatment/Exercise - 02/20/15 0001    Knee/Hip Exercises: Stretches   Active Hamstring Stretch Both;3 reps;30 seconds   Active Hamstring Stretch Limitations 14 inch box, 3 way    Knee: Self-Stretch to increase Flexion 10 seconds   Knee: Self-Stretch Limitations 10 reps at 12" step   Gastroc Stretch 3 reps;30 seconds   Gastroc Stretch Limitations slantboard    Knee/Hip Exercises: Aerobic   Stationary Bike 10'  to increase flexion   Knee/Hip Exercises: Machines for Strengthening   Cybex Knee Extension 4 Pl x 15   Cybex Knee Flexion 5 pl x 15   Knee/Hip Exercises: Standing   Forward Lunges 15 reps   Forward Lunges Limitations 4' step   Side Lunges 15 reps   Side Lunges Limitations 4" step   Step Down 15 reps;Step Height: 4"   Wall Squat 10 reps   SLS x5, 8" max   Other Standing Knee Exercises tandem stance 2x30"   Other Standing Knee Exercises sidestepping with green tband x 2 RT                  PT Short Term Goals - 02/01/15 0919    PT SHORT TERM GOAL #1   Title Patient will experience no more than 4/10 pain in his L knee during all gait based takss and weight bearing functional activities    Baseline 7/20- getting close to meeting goal    Time 2   Period Weeks   Status On-going    PT SHORT TERM GOAL #2   Title Patient will demonstrate the ability to safely and efficiently use crutches, both bilateral and unilaterally, to assist with mobility and gait with no falls reported on a consistent basis    Baseline 01/17/2015:  No assistive device being used    Time 2   Period Weeks   Status Achieved   PT SHORT TERM GOAL #3   Title Patient will demonstrate bilateral hip ER of at least 35 degrees and bilateral hip IR of at least 30 degrees in order to improve overall mobility and mechanics    Time 2   Period Weeks   Status Partially Met   PT SHORT TERM GOAL #4   Title Patient will demonstrate at least 4+/5 strength in bilateral lower extremities and at least 4-/5 strength in all proximal musculature    Baseline  01/17/2015:  knee extension is still lacking    Time 2   Period Weeks   Status Partially Met   PT SHORT TERM GOAL #5   Title Patient will demonstrate enhanced gait mechanics as evidenced by improved ability to bear weight down through L LE, improved step length R LE, improved stance time L LE, and improved overall gait speed with increased cadence    Time 2   Period Weeks   Status Achieved           PT Long Term Goals - 02/01/15 1610    PT LONG TERM GOAL #1   Title Patient will be independent in correctly and consistently performing appropriate HEP, to be updated PRN    Time 4   Period Weeks   Status Achieved   PT LONG TERM GOAL #2   Title Patient will experience no more than 1/10 pain in his L knee with all functional weight bearing tasks for an unlimited amount of time with no assistive device    Time 4   Period Weeks   Status On-going   PT LONG TERM GOAL #3   Title Patient will be able to ambulate unlimited distances and over both even and unven surfaces with no assistive device and pain no more than 1/10 L knee    Time 4   Period Weeks   Status Partially Met   PT LONG TERM GOAL #4   Title Patient will be able to verbalize the importance of good,  correct posture and will be able to maintain proper  posture during all functional tasks with no cues    Time 4   Period Weeks   Status Achieved   PT LONG TERM GOAL #5   Title Patient to demonstrate L knee extension of no more than 3 degrees and L knee flexion of at least 120 degrees with pain 0/10   Baseline 7/20- 0 to 108   Time 4   Period Weeks   Status On-going   Additional Long Term Goals   Additional Long Term Goals Yes   PT LONG TERM GOAL #6   Title Patient will be able to walk downhill for unlimited amounts of time with pain no more than 2/10 pain in L knee    Time 4   Period Weeks   Status New               Plan - 02/20/15 5498    Clinical Impression Statement Pt continues to have difficulty with SLS, and was only able to maintain for 8 seconds max on the LLE. Pt continues to demonstrate LLE weakness with quad strengthening exercises, and will benefit from continued eccentric quad strengthening and balance training to improve stability of L knee.    PT Next Visit Plan Continue with eccentric quad strengthening, SLS, single leg strengthening.         Problem List Patient Active Problem List   Diagnosis Date Noted  . Venous stasis ulcer 03/10/2013  . COPD (chronic obstructive pulmonary disease) 03/10/2013  . Tobacco abuse 03/10/2013  . Morbid obesity 03/10/2013  . Diabetes 03/10/2013  . Diabetes mellitus without complication 26/41/5830  . HTN (hypertension) 10/01/2012  . Other and unspecified hyperlipidemia 10/01/2012  . Chronic pain syndrome 10/01/2012  . Knee pain 03/02/2012  . Knee stiffness 03/02/2012    Hilma Favors, PT, DPT [phone] 02/20/2015, 9:27 AM  [facility] [address] [place], [place], [address] Phone: [phone]   Fax:  [phone]

## 2015-02-22 ENCOUNTER — Ambulatory Visit (HOSPITAL_COMMUNITY): Payer: Medicare Other

## 2015-02-22 DIAGNOSIS — M25662 Stiffness of left knee, not elsewhere classified: Secondary | ICD-10-CM | POA: Diagnosis not present

## 2015-02-22 DIAGNOSIS — R269 Unspecified abnormalities of gait and mobility: Secondary | ICD-10-CM

## 2015-02-22 DIAGNOSIS — R29898 Other symptoms and signs involving the musculoskeletal system: Secondary | ICD-10-CM

## 2015-02-22 DIAGNOSIS — M25562 Pain in left knee: Secondary | ICD-10-CM

## 2015-02-22 DIAGNOSIS — Z9889 Other specified postprocedural states: Secondary | ICD-10-CM

## 2015-02-22 DIAGNOSIS — R293 Abnormal posture: Secondary | ICD-10-CM

## 2015-02-22 DIAGNOSIS — M25659 Stiffness of unspecified hip, not elsewhere classified: Secondary | ICD-10-CM

## 2015-02-22 DIAGNOSIS — R609 Edema, unspecified: Secondary | ICD-10-CM

## 2015-02-22 NOTE — Therapy (Signed)
[facility] [address] [place], [place], [address] Phone: [phone]   Fax:  [phone]  Physical Therapy Treatment  Patient Details  Name: Shaun Harris MRN: [id number] Date of Birth: 12/26/1965 Referring Provider:  Keeling, Wayne, MD  Encounter Date: 02/22/2015      PT End of Session - 02/22/15 0941    Visit Number 18   Number of Visits 24   Date for PT Re-Evaluation 02/25/15   Authorization Type Medicare    Authorization Time Period 12/26/14 to 02/25/15; G-codes done 10th session    Authorization - Visit Number 18   Authorization - Number of Visits 20   PT Start Time 0848   PT Stop Time 0941   PT Time Calculation (min) 53 min   Activity Tolerance Patient tolerated treatment well   Behavior During Therapy [facility] for tasks assessed/performed      Past Medical History  Diagnosis Date  . Hypertension   . Diabetes mellitus   . COPD (chronic obstructive pulmonary disease)   . Anxiety   . Ulcerative colitis   . Ulcerative colitis   . Hypertriglyceridemia   . Venous stasis   . COPD (chronic obstructive pulmonary disease)   . Tobacco abuse   . Hyperlipidemia   . Stroke     pt states,"they arent sure if i had a stroke in 1997, there is still a question about that with my doctors". No deficits.  . Chronic headaches     constant since possible stroke in 1997,"Vasculitis of brain with increased pressure of brain ".  . Sleep apnea     has but doesnt use, cannot tolerate; PCP aware  . Arthritis   . Cerebral vasculitis     Past Surgical History  Procedure Laterality Date  . Cervical spine surgery  2008  . Shoulder arthroscopy  2004    left shoulder  . Shoulder arthroscopy  2008    right shoulder  . Knee arthroscopy  02/25/2012    Procedure: ARTHROSCOPY KNEE;  Surgeon: [place] Keeling, MD;  Location: [facility];  Service: Orthopedics;  Laterality: Right;  . Knee arthroscopy with medial menisectomy Left 12/23/2014    Procedure: LEFT  KNEE ARTHROSCOPY WITH PARTIAL MENISECTOMY;  Surgeon: Wayne Keeling, MD;  Location: [facility];  Service: Orthopedics;  Laterality: Left;    There were no vitals filed for this visit.  Visit Diagnosis:  S/P left knee arthroscopy  Left knee pain  Knee stiffness, left  Abnormality of gait  Bilateral leg weakness  Edema  Hip stiffness, unspecified laterality  Poor posture      Subjective Assessment - 02/22/15 0907    Subjective Pt stated pain scale 2-3/10   Currently in Pain? Yes   Pain Score 2    Pain Location Knee   Pain Orientation Left;Anterior   Pain Descriptors / Indicators Aching;Sore          [facility] Adult PT Treatment/Exercise - 02/22/15 0001    Exercises   Exercises Knee/Hip   Knee/Hip Exercises: Stretches   Gastroc Stretch 3 reps;30 seconds   Gastroc Stretch Limitations slantboard    Knee/Hip Exercises: Aerobic   Stationary Bike 10' to increase flexion seat 8 for ROM   Knee/Hip Exercises: Machines for Strengthening   Cybex Knee Extension 4 Pl x 15   Cybex Knee Flexion 7Pl x 15   Knee/Hip Exercises: Standing   Forward Lunges 15 reps   Forward Lunges Limitations 4' step   Side Lunges 15 reps     Side Lunges Limitations 4" step   Lateral Step Up Left;15 reps;Hand Hold: 2;Step Height: 4"   Step Down 15 reps;Step Height: 4"   Wall Squat 10 reps;5 seconds   SLS Lt 12", Rt 32" max of 3   Other Standing Knee Exercises tandem stance 2x30" on [employer]   Other Standing Knee Exercises sidestepping with green tband x 2 RT             PT Short Term Goals - 02/01/15 0919    PT SHORT TERM GOAL #1   Title Patient will experience no more than 4/10 pain in his L knee during all gait based takss and weight bearing functional activities    Baseline 7/20- getting close to meeting goal    Time 2   Period Weeks   Status On-going   PT SHORT TERM GOAL #2   Title Patient will demonstrate the ability to safely and efficiently use crutches, both bilateral and unilaterally, to  assist with mobility and gait with no falls reported on a consistent basis    Baseline 01/17/2015:  No assistive device being used    Time 2   Period Weeks   Status Achieved   PT SHORT TERM GOAL #3   Title Patient will demonstrate bilateral hip ER of at least 35 degrees and bilateral hip IR of at least 30 degrees in order to improve overall mobility and mechanics    Time 2   Period Weeks   Status Partially Met   PT SHORT TERM GOAL #4   Title Patient will demonstrate at least 4+/5 strength in bilateral lower extremities and at least 4-/5 strength in all proximal musculature    Baseline  01/17/2015:  knee extension is still lacking    Time 2   Period Weeks   Status Partially Met   PT SHORT TERM GOAL #5   Title Patient will demonstrate enhanced gait mechanics as evidenced by improved ability to bear weight down through L LE, improved step length R LE, improved stance time L LE, and improved overall gait speed with increased cadence    Time 2   Period Weeks   Status Achieved           PT Long Term Goals - 02/01/15 1610    PT LONG TERM GOAL #1   Title Patient will be independent in correctly and consistently performing appropriate HEP, to be updated PRN    Time 4   Period Weeks   Status Achieved   PT LONG TERM GOAL #2   Title Patient will experience no more than 1/10 pain in his L knee with all functional weight bearing tasks for an unlimited amount of time with no assistive device    Time 4   Period Weeks   Status On-going   PT LONG TERM GOAL #3   Title Patient will be able to ambulate unlimited distances and over both even and unven surfaces with no assistive device and pain no more than 1/10 L knee    Time 4   Period Weeks   Status Partially Met   PT LONG TERM GOAL #4   Title Patient will be able to verbalize the importance of good, correct posture and will be able to maintain proper posture during all functional tasks with no cues    Time 4   Period Weeks   Status Achieved    PT LONG TERM GOAL #5   Title Patient to demonstrate L knee extension of no more than  3 degrees and L knee flexion of at least 120 degrees with pain 0/10   Baseline 7/20- 0 to 108   Time 4   Period Weeks   Status On-going   Additional Long Term Goals   Additional Long Term Goals Yes   PT LONG TERM GOAL #6   Title Patient will be able to walk downhill for unlimited amounts of time with pain no more than 2/10 pain in L knee    Time 4   Period Weeks   Status New               Plan - 02/22/15 0941    Clinical Impression Statement Pt balance is improving slowly, SLS Lt 12", Rt 32" max of 3 attempts.  Added foam with tandem stance with min assistance for LOB on dynamic surfaces.  Pt does continue to demonstre Lt LE weakness with quad strengthening exercises, verbal cueing to reduce rotation and improve eccentric control while descending stairs.  No reports of pain through sessinon.   PT Next Visit Plan Continue with eccentric quad strengthening, SLS, single leg strengthening.         Problem List Patient Active Problem List   Diagnosis Date Noted  . Venous stasis ulcer 03/10/2013  . COPD (chronic obstructive pulmonary disease) 03/10/2013  . Tobacco abuse 03/10/2013  . Morbid obesity 03/10/2013  . Diabetes 03/10/2013  . Diabetes mellitus without complication 74/94/4967  . HTN (hypertension) 10/01/2012  . Other and unspecified hyperlipidemia 10/01/2012  . Chronic pain syndrome 10/01/2012  . Knee pain 03/02/2012  . Knee stiffness 03/02/2012   Ihor Austin, LPTA; CBIS [phone]   Aldona Lento 02/22/2015, 10:18 AM  [place], [place], [address] Phone: [phone]   Fax:  [phone]

## 2015-02-27 ENCOUNTER — Telehealth: Payer: Self-pay | Admitting: Family Medicine

## 2015-02-27 ENCOUNTER — Ambulatory Visit (HOSPITAL_COMMUNITY): Payer: Medicare Other | Admitting: Physical Therapy

## 2015-02-27 DIAGNOSIS — R609 Edema, unspecified: Secondary | ICD-10-CM | POA: Diagnosis not present

## 2015-02-27 DIAGNOSIS — R269 Unspecified abnormalities of gait and mobility: Secondary | ICD-10-CM

## 2015-02-27 DIAGNOSIS — M25662 Stiffness of left knee, not elsewhere classified: Secondary | ICD-10-CM | POA: Diagnosis not present

## 2015-02-27 DIAGNOSIS — M25562 Pain in left knee: Secondary | ICD-10-CM | POA: Diagnosis not present

## 2015-02-27 DIAGNOSIS — Z9889 Other specified postprocedural states: Secondary | ICD-10-CM | POA: Diagnosis not present

## 2015-02-27 DIAGNOSIS — R29898 Other symptoms and signs involving the musculoskeletal system: Secondary | ICD-10-CM | POA: Diagnosis not present

## 2015-02-27 MED ORDER — HYDROCODONE-ACETAMINOPHEN 10-325 MG PO TABS: 1.0000 | ORAL_TABLET | ORAL | Status: DC | PRN

## 2015-02-27 MED ORDER — MORPHINE SULFATE ER 30 MG PO TBCR: 30.0000 mg | EXTENDED_RELEASE_TABLET | Freq: Two times a day (BID) | ORAL | Status: DC
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
## 2015-02-27 NOTE — Telephone Encounter (Signed)
Scripts ready for pickup and Amy was notified.

## 2015-02-27 NOTE — Telephone Encounter (Signed)
Nurse to assess may ref if time

## 2015-02-27 NOTE — Telephone Encounter (Signed)
morphine (MS CONTIN) 30 MG 12 hr tablet HYDROcodone-acetaminophen (NORCO) 10-325 MG per tablet   Pt spouse states he needs a refill on both these meds, advised that he should have a script Already to cover him for a refill on the 11th of Aug but she states they didn't get that refill  Only the morphine   Can we get a refill on these two   Dr Luna Glasgow is no longer seeing him and he is not taking the oxycodone any further.  Spoke with the pharmacy an the only thing turned in was the morphine

## 2015-03-01 ENCOUNTER — Encounter (HOSPITAL_COMMUNITY): Payer: Medicare Other | Admitting: Physical Therapy

## 2015-03-06 ENCOUNTER — Encounter (HOSPITAL_COMMUNITY): Payer: Medicare Other | Admitting: Physical Therapy

## 2015-03-08 ENCOUNTER — Encounter (HOSPITAL_COMMUNITY): Payer: Medicare Other | Admitting: Physical Therapy

## 2015-03-21 ENCOUNTER — Other Ambulatory Visit: Payer: Self-pay | Admitting: Family Medicine

## 2015-03-22 ENCOUNTER — Ambulatory Visit (INDEPENDENT_AMBULATORY_CARE_PROVIDER_SITE_OTHER): Payer: Medicare Other | Admitting: Family Medicine

## 2015-03-22 ENCOUNTER — Telehealth: Payer: Self-pay | Admitting: Family Medicine

## 2015-03-22 ENCOUNTER — Encounter: Payer: Self-pay | Admitting: Family Medicine

## 2015-03-22 VITALS — BP 140/92 | Ht 67.0 in | Wt 278.4 lb

## 2015-03-22 DIAGNOSIS — Z23 Encounter for immunization: Secondary | ICD-10-CM | POA: Diagnosis not present

## 2015-03-22 DIAGNOSIS — G894 Chronic pain syndrome: Secondary | ICD-10-CM | POA: Diagnosis not present

## 2015-03-22 DIAGNOSIS — J449 Chronic obstructive pulmonary disease, unspecified: Secondary | ICD-10-CM | POA: Diagnosis not present

## 2015-03-22 DIAGNOSIS — J431 Panlobular emphysema: Secondary | ICD-10-CM

## 2015-03-22 DIAGNOSIS — M542 Cervicalgia: Secondary | ICD-10-CM

## 2015-03-22 DIAGNOSIS — H6092 Unspecified otitis externa, left ear: Secondary | ICD-10-CM

## 2015-03-22 DIAGNOSIS — E119 Type 2 diabetes mellitus without complications: Secondary | ICD-10-CM

## 2015-03-22 DIAGNOSIS — F17200 Nicotine dependence, unspecified, uncomplicated: Secondary | ICD-10-CM | POA: Diagnosis not present

## 2015-03-22 DIAGNOSIS — I10 Essential (primary) hypertension: Secondary | ICD-10-CM | POA: Diagnosis not present

## 2015-03-22 DIAGNOSIS — Z6841 Body Mass Index (BMI) 40.0 and over, adult: Secondary | ICD-10-CM | POA: Diagnosis not present

## 2015-03-22 DIAGNOSIS — S83242D Other tear of medial meniscus, current injury, left knee, subsequent encounter: Secondary | ICD-10-CM | POA: Diagnosis not present

## 2015-03-22 DIAGNOSIS — M25562 Pain in left knee: Secondary | ICD-10-CM | POA: Diagnosis not present

## 2015-03-22 LAB — POCT GLYCOSYLATED HEMOGLOBIN (HGB A1C): Hemoglobin A1C: 6.8

## 2015-03-22 MED ORDER — HYDROCODONE-ACETAMINOPHEN 10-325 MG PO TABS: 1.0000 | ORAL_TABLET | ORAL | Status: DC | PRN

## 2015-03-22 MED ORDER — CIPROFLOXACIN HCL 500 MG PO TABS: 500.0000 mg | ORAL_TABLET | Freq: Two times a day (BID) | ORAL | Status: DC

## 2015-03-22 MED ORDER — MORPHINE SULFATE ER 30 MG PO TBCR: 30.0000 mg | EXTENDED_RELEASE_TABLET | Freq: Two times a day (BID) | ORAL | Status: DC

## 2015-03-22 MED ORDER — MORPHINE SULFATE ER 30 MG PO TBCR
30.0000 mg | EXTENDED_RELEASE_TABLET | Freq: Two times a day (BID) | ORAL | Status: DC
Start: 2015-03-22 — End: 2015-03-22

## 2015-03-22 MED ORDER — NEOMYCIN-POLYMYXIN-HC 3.5-10000-1 OT SOLN: OTIC | Status: DC

## 2015-03-22 NOTE — Progress Notes (Signed)
   Subjective:    Patient ID: Shaun Harris, male    DOB: 10/07/1965, 49 y.o.   MRN: [id number] Patient arrives for discussion of numerous concerns.  Patient has chronic pain. Disabling in nature. Actually needs pain medication without it cannot function. Time for refills. Claims compliance and no abuse. No daytime drowsiness.  Patient compliant with blood pressure medicine. Watching salt intake. Generally does not miss a dose.  Ongoing challenges with wheezing. Uses Ventolin when necessary. Maybe once per week. On Advair Diskus with good benefit from breathing.   Diabetes He presents for his follow-up diabetic visit. He has type 2 diabetes mellitus. There are no hypoglycemic associated symptoms. There are no diabetic associated symptoms. There are no hypoglycemic complications. There are no diabetic complications. There are no known risk factors for coronary artery disease. Current diabetic treatment includes insulin injections. He is compliant with treatment all of the time.   Patient states that he has neck and left ear pain. This has been present for about 2 days now. Left ear pain feels like swimmers ear    Last diabetic eye exam- has never had one done. Patient is going to reschedule appointment.   Results for orders placed or performed in visit on 03/22/15  POCT glycosylated hemoglobin (Hb A1C)  Result Value Ref Range   Hemoglobin A1C 6.8     Review of Systems Positive chronic headache no chest pain some shortness breath and wheezing your pain see present illness no change in bowel habits no blood in stool    Objective:   Physical Exam Alert vitals stable blood pressure good on repeat HEENT normal. Lungs clear. Heart rare rhythm. Ankles without edema.       Assessment & Plan:  Impression 1 type 2 diabetes good control maintain same #2 left external otitis discussed with some central information on 3 hypertension good control #4 chronic pain in need of multiple narcotics  compliance discussed plan medications refilled. Antibiotics prescribed. Diet exercise discussed. Maintain other chronic medications. Recheck in several months. WSL

## 2015-03-22 NOTE — Telephone Encounter (Signed)
Yes di one yrs worth when possible, pt sees [place] every three mo faithfully

## 2015-03-22 NOTE — Telephone Encounter (Signed)
Referral in epic Tried to call patient no answer.
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
## 2015-03-22 NOTE — Telephone Encounter (Signed)
There is a statutes of limitations they are working against which is why they are requesting this be done on high priority

## 2015-03-23 NOTE — Telephone Encounter (Signed)
Pt notified on vm

## 2015-03-26 ENCOUNTER — Encounter: Payer: Self-pay | Admitting: Family Medicine

## 2015-03-27 ENCOUNTER — Other Ambulatory Visit (HOSPITAL_COMMUNITY): Payer: Self-pay | Admitting: Orthopaedic Surgery

## 2015-03-27 ENCOUNTER — Ambulatory Visit (HOSPITAL_COMMUNITY)
Admission: RE | Admit: 2015-03-27 | Discharge: 2015-03-27 | Disposition: A | Discharge status: Home / Self Care | Payer: Medicare Other | Source: Ambulatory Visit | Attending: Orthopaedic Surgery | Admitting: Orthopaedic Surgery

## 2015-03-27 DIAGNOSIS — M542 Cervicalgia: Secondary | ICD-10-CM | POA: Diagnosis not present

## 2015-03-28 DIAGNOSIS — E119 Type 2 diabetes mellitus without complications: Secondary | ICD-10-CM | POA: Diagnosis not present

## 2015-03-28 DIAGNOSIS — Z6841 Body Mass Index (BMI) 40.0 and over, adult: Secondary | ICD-10-CM | POA: Diagnosis not present

## 2015-03-28 DIAGNOSIS — F17200 Nicotine dependence, unspecified, uncomplicated: Secondary | ICD-10-CM | POA: Diagnosis not present

## 2015-03-28 DIAGNOSIS — J449 Chronic obstructive pulmonary disease, unspecified: Secondary | ICD-10-CM | POA: Diagnosis not present

## 2015-03-28 DIAGNOSIS — M542 Cervicalgia: Secondary | ICD-10-CM | POA: Diagnosis not present

## 2015-03-28 DIAGNOSIS — I10 Essential (primary) hypertension: Secondary | ICD-10-CM | POA: Diagnosis not present

## 2015-03-29 ENCOUNTER — Other Ambulatory Visit (HOSPITAL_COMMUNITY): Payer: Self-pay | Admitting: Orthopaedic Surgery

## 2015-03-29 DIAGNOSIS — M542 Cervicalgia: Secondary | ICD-10-CM

## 2015-04-03 DIAGNOSIS — Z720 Tobacco use: Secondary | ICD-10-CM | POA: Diagnosis not present

## 2015-04-03 DIAGNOSIS — Z794 Long term (current) use of insulin: Secondary | ICD-10-CM | POA: Diagnosis not present

## 2015-04-03 DIAGNOSIS — H524 Presbyopia: Secondary | ICD-10-CM | POA: Diagnosis not present

## 2015-04-03 DIAGNOSIS — E119 Type 2 diabetes mellitus without complications: Secondary | ICD-10-CM | POA: Diagnosis not present

## 2015-04-04 LAB — HM DIABETES EYE EXAM

## 2015-04-05 ENCOUNTER — Ambulatory Visit (HOSPITAL_COMMUNITY)
Admission: RE | Admit: 2015-04-05 | Discharge: 2015-04-05 | Disposition: A | Discharge status: Home / Self Care | Payer: Medicare Other | Source: Ambulatory Visit | Attending: Orthopaedic Surgery | Admitting: Orthopaedic Surgery

## 2015-04-05 DIAGNOSIS — M542 Cervicalgia: Secondary | ICD-10-CM | POA: Diagnosis not present

## 2015-04-05 DIAGNOSIS — I10 Essential (primary) hypertension: Secondary | ICD-10-CM | POA: Diagnosis not present

## 2015-04-05 DIAGNOSIS — E119 Type 2 diabetes mellitus without complications: Secondary | ICD-10-CM | POA: Diagnosis not present

## 2015-04-05 DIAGNOSIS — M5022 Other cervical disc displacement, mid-cervical region: Secondary | ICD-10-CM | POA: Diagnosis not present

## 2015-04-05 LAB — CREATININE, SERUM
Creatinine, Ser: 0.63 mg/dL (ref 0.61–1.24)
GFR calc Af Amer: >60 mL/min (ref >60)
GFR calc non Af Amer: >60 mL/min (ref >60)

## 2015-04-05 MED ORDER — GADOBENATE DIMEGLUMINE 529 MG/ML IV SOLN
20.0000 mL | Freq: Once | INTRAVENOUS | Status: AC | PRN
  Administered 2015-04-05: 20 mL via INTRAVENOUS

## 2015-04-10 DIAGNOSIS — J449 Chronic obstructive pulmonary disease, unspecified: Secondary | ICD-10-CM | POA: Diagnosis not present

## 2015-04-10 DIAGNOSIS — I10 Essential (primary) hypertension: Secondary | ICD-10-CM | POA: Diagnosis not present

## 2015-04-10 DIAGNOSIS — E119 Type 2 diabetes mellitus without complications: Secondary | ICD-10-CM | POA: Diagnosis not present

## 2015-04-10 DIAGNOSIS — R600 Localized edema: Secondary | ICD-10-CM | POA: Diagnosis not present

## 2015-04-10 DIAGNOSIS — Z6841 Body Mass Index (BMI) 40.0 and over, adult: Secondary | ICD-10-CM | POA: Diagnosis not present

## 2015-04-10 DIAGNOSIS — F17200 Nicotine dependence, unspecified, uncomplicated: Secondary | ICD-10-CM | POA: Diagnosis not present

## 2015-04-10 DIAGNOSIS — M542 Cervicalgia: Secondary | ICD-10-CM | POA: Diagnosis not present

## 2015-04-10 DIAGNOSIS — Z8709 Personal history of other diseases of the respiratory system: Secondary | ICD-10-CM | POA: Diagnosis not present

## 2015-04-17 ENCOUNTER — Other Ambulatory Visit: Payer: Self-pay | Admitting: *Deleted

## 2015-04-17 ENCOUNTER — Telehealth: Payer: Self-pay | Admitting: Family Medicine

## 2015-04-17 ENCOUNTER — Telehealth (HOSPITAL_COMMUNITY): Payer: Self-pay | Admitting: Physical Therapy

## 2015-04-17 MED ORDER — ALPRAZOLAM 1 MG PO TABS: 1.0000 mg | ORAL_TABLET | Freq: Two times a day (BID) | ORAL | Status: DC | PRN

## 2015-04-17 NOTE — Telephone Encounter (Signed)
Med sent to pharm. Pt notified.  

## 2015-04-17 NOTE — Telephone Encounter (Signed)
ALPRAZolam (XANAX) 1 MG tablet  Pt states pharmacy has been faxing [place] to get this  Refilled
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
## 2015-04-17 NOTE — Telephone Encounter (Signed)
Last seen 9/7

## 2015-04-17 NOTE — Telephone Encounter (Signed)
Ok if time with five monthly ref

## 2015-04-18 ENCOUNTER — Ambulatory Visit (HOSPITAL_COMMUNITY): Payer: Medicare Other | Attending: Orthopaedic Surgery | Admitting: Physical Therapy

## 2015-04-18 DIAGNOSIS — M5410 Radiculopathy, site unspecified: Secondary | ICD-10-CM | POA: Diagnosis not present

## 2015-04-18 DIAGNOSIS — R29898 Other symptoms and signs involving the musculoskeletal system: Secondary | ICD-10-CM | POA: Diagnosis not present

## 2015-04-18 DIAGNOSIS — M542 Cervicalgia: Secondary | ICD-10-CM | POA: Diagnosis not present

## 2015-04-18 DIAGNOSIS — R293 Abnormal posture: Secondary | ICD-10-CM | POA: Diagnosis not present

## 2015-04-18 DIAGNOSIS — R6889 Other general symptoms and signs: Secondary | ICD-10-CM | POA: Insufficient documentation
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
## 2015-04-18 NOTE — Patient Instructions (Signed)
SUPINE CHIN TUCKS  Lie on back.  Hands on belly or to side.  Press  back of head to surface.  Relax and repeat.        Isometric right cervical rotation with eye gaze  Hold your hand against your head and provide light resistance to your head while turning to look to the right.  CERVICAL EXTENSION  Tilt your head upwards, then return back to looking straight ahead.   CERVICAL FLEXION  Tilt your head downwards, then return back to looking straight ahead.  Shoulder Blade Squeezes (Scapular Retraction)  Retract shouders back, squeezing shoulder blades together.  Hold for 5 seconds for 10-20 repititions

## 2015-04-20 ENCOUNTER — Ambulatory Visit (HOSPITAL_COMMUNITY): Payer: Medicare Other

## 2015-04-20 DIAGNOSIS — R29898 Other symptoms and signs involving the musculoskeletal system: Secondary | ICD-10-CM

## 2015-04-20 DIAGNOSIS — R293 Abnormal posture: Secondary | ICD-10-CM | POA: Diagnosis not present

## 2015-04-20 DIAGNOSIS — M5410 Radiculopathy, site unspecified: Secondary | ICD-10-CM

## 2015-04-20 DIAGNOSIS — M542 Cervicalgia: Secondary | ICD-10-CM

## 2015-04-20 DIAGNOSIS — R6889 Other general symptoms and signs: Secondary | ICD-10-CM | POA: Diagnosis not present

## 2015-04-20 NOTE — Therapy (Signed)
[place], [place], [address] Phone: [phone]   Fax:  [phone]  Physical Therapy Treatment  Patient Details  Name: Shaun Harris MRN: [id number] Date of Birth: June 25, 1966 Referring Provider:  Sanjuana Kava, MD  Encounter Date: 04/20/2015      PT End of Session - 04/20/15 0941    Visit Number 2   Number of Visits 10   Date for PT Re-Evaluation 05/16/15   Authorization Type Medicare    Authorization Time Period 04/18/15-06/13/15   Authorization - Visit Number 2   Authorization - Number of Visits 10   PT Start Time 0934   PT Stop Time 1018   PT Time Calculation (min) 44 min   Activity Tolerance Patient tolerated treatment well   Behavior During Therapy [facility] for tasks assessed/performed      Past Medical History  Diagnosis Date  . Hypertension   . Diabetes mellitus   . COPD (chronic obstructive pulmonary disease)   . Anxiety   . Ulcerative colitis   . Ulcerative colitis   . Hypertriglyceridemia   . Venous stasis   . COPD (chronic obstructive pulmonary disease)   . Tobacco abuse   . Hyperlipidemia   . Stroke     pt states,"they arent sure if i had a stroke in 1997, there is still a question about that with my doctors". No deficits.  . Chronic headaches     constant since possible stroke in 1997,"Vasculitis of brain with increased pressure of brain ".  . Sleep apnea     has but doesnt use, cannot tolerate; PCP aware  . Arthritis   . Cerebral vasculitis     Past Surgical History  Procedure Laterality Date  . Cervical spine surgery  2008  . Shoulder arthroscopy  2004    left shoulder  . Shoulder arthroscopy  2008    right shoulder  . Knee arthroscopy  02/25/2012    Procedure: ARTHROSCOPY KNEE;  Surgeon: Sanjuana Kava, MD;  Location: [facility];  Service: Orthopedics;  Laterality: Right;  . Knee arthroscopy with medial menisectomy Left 12/23/2014    Procedure: LEFT KNEE ARTHROSCOPY WITH PARTIAL  MENISECTOMY;  Surgeon: Sanjuana Kava, MD;  Location: [facility];  Service: Orthopedics;  Laterality: Left;    There were no vitals filed for this visit.  Visit Diagnosis:  Neck pain  Radiculopathy affecting upper extremity  Weakness of right arm  Poor posture  Decreased functional activity tolerance      Subjective Assessment - 04/20/15 0938    Subjective Pt stated neck is really sore today with radicular symptoms to 2 middle fingers Rt UE.  Reports compliance for HEP without question.   Pertinent History Hx of anterior cervical fusion of C6/7 in 2008.   Currently in Pain? Yes   Pain Score 5    Pain Location Neck   Pain Descriptors / Indicators Sore;Aching            [facility] Adult PT Treatment/Exercise - 04/20/15 0001    Exercises   Exercises Neck   Neck Exercises: Seated   Neck Retraction 10 reps   Neck Retraction Limitations verbal and tactile cueing   Cervical Rotation 10 reps   Cervical Rotation Limitations 3D cervical excursion   Lateral Flexion 10 reps   Lateral Flexion Limitations 3D cervical excursion   Shoulder Rolls Backwards;10 reps   Shoulder Rolls Limitations up, back and down   Postural Training Education on importance of proper seated posture  Other Seated Exercise 3D thoracic excursion with arms crossed on chest   Manual Therapy   Manual Therapy Soft tissue mobilization   Soft tissue mobilization supine position cervical mm and upper traps with LE elevated            PT Short Term Goals - 04/18/15 1332    PT SHORT TERM GOAL #1   Title Pt will be independent with HEP   Time 3   Period Weeks   Status New   PT SHORT TERM GOAL #2   Title Improve cervical ROM by 10 degrees in all planes to demonstrate improved cervical mobility with decreased pain.   Time 3   Period Weeks   Status New   PT SHORT TERM GOAL #3   Title Improve R shoulder flexion and abduction to 140 or greater to equal L side and to allow for improved function.    Time 3   Period  Weeks   Status New           PT Long Term Goals - 04/18/15 1338    PT LONG TERM GOAL #1   Title Pt will be independent with advanced HEP for cervical strengthening, stretching, and postural strengthening.    Time 6   Period Weeks   Status New   PT LONG TERM GOAL #2   Title Improve cervical flexion, extension, and rotation by 15 degrees or more to improve pt's ability to drive and complete ADLs without pain.    Time 6   Period Weeks   Status New   PT LONG TERM GOAL #3   Title Normalize sensation in RUE evidenced by equal light touch sensation at C4 and no tingling present at C7 dermatome   Time 6   Period Weeks   Status New   PT LONG TERM GOAL #4   Title Pt will demonstrate normalized posture without cueing and <1/10 pain throughout entire treatment session to demonstrate improved postural strength.    Time 6   Period Weeks   Status New               Plan - 04/20/15 0941    Clinical Impression Statement Reviewed goals, compliance with HEP and copy of evaluation given to pt.  Session focus on improving cervical ROM and education on importance of proper posture.  MIn cueing required for correct form and technqiue with exercises.  Ended session with manual soft tissue mobilization techniques to address overall tightness cervical region, able to reduce but unable to fully resolve tightness Bil Upper trap region.   PT Next Visit Plan Continue with current PT POC to improve cervical and UE ROM, posture strengthening and pain control        Problem List Patient Active Problem List   Diagnosis Date Noted  . Venous stasis ulcer ([place]) 03/10/2013  . COPD (chronic obstructive pulmonary disease) ([place]) 03/10/2013  . Tobacco abuse 03/10/2013  . Morbid obesity ([place]) 03/10/2013  . Diabetes ([place]) 03/10/2013  . Diabetes mellitus without complication ([place]) 76/72/0947  . HTN (hypertension) 10/01/2012  . Other and unspecified hyperlipidemia 10/01/2012  . Chronic pain syndrome  10/01/2012  . Knee pain 03/02/2012  . Knee stiffness 03/02/2012   Ihor Austin, LPTA; CBIS [phone]  Aldona Lento 04/20/2015, 10:37 AM  [place], [place], [address] Phone: [phone]   Fax:  [phone]

## 2015-04-24 DIAGNOSIS — Z8709 Personal history of other diseases of the respiratory system: Secondary | ICD-10-CM | POA: Diagnosis not present

## 2015-04-24 DIAGNOSIS — F17200 Nicotine dependence, unspecified, uncomplicated: Secondary | ICD-10-CM | POA: Diagnosis not present

## 2015-04-24 DIAGNOSIS — Z6841 Body Mass Index (BMI) 40.0 and over, adult: Secondary | ICD-10-CM | POA: Diagnosis not present

## 2015-04-24 DIAGNOSIS — R600 Localized edema: Secondary | ICD-10-CM | POA: Diagnosis not present

## 2015-04-24 DIAGNOSIS — M542 Cervicalgia: Secondary | ICD-10-CM | POA: Diagnosis not present

## 2015-04-24 DIAGNOSIS — E119 Type 2 diabetes mellitus without complications: Secondary | ICD-10-CM | POA: Diagnosis not present

## 2015-04-24 DIAGNOSIS — J449 Chronic obstructive pulmonary disease, unspecified: Secondary | ICD-10-CM | POA: Diagnosis not present

## 2015-04-24 DIAGNOSIS — I10 Essential (primary) hypertension: Secondary | ICD-10-CM | POA: Diagnosis not present

## 2015-04-25 ENCOUNTER — Ambulatory Visit (HOSPITAL_COMMUNITY): Payer: Medicare Other

## 2015-04-25 DIAGNOSIS — R6889 Other general symptoms and signs: Secondary | ICD-10-CM

## 2015-04-25 DIAGNOSIS — M5410 Radiculopathy, site unspecified: Secondary | ICD-10-CM | POA: Diagnosis not present

## 2015-04-25 DIAGNOSIS — R293 Abnormal posture: Secondary | ICD-10-CM | POA: Diagnosis not present

## 2015-04-25 DIAGNOSIS — R29898 Other symptoms and signs involving the musculoskeletal system: Secondary | ICD-10-CM

## 2015-04-25 DIAGNOSIS — M542 Cervicalgia: Secondary | ICD-10-CM

## 2015-04-25 NOTE — Therapy (Signed)
[place], [place], [address] Phone: [phone]   Fax:  [phone]  Physical Therapy Treatment  Patient Details  Name: Shaun Harris MRN: [id number] Date of Birth: Mar 13, 1966 Referring Provider:  Sanjuana Kava, MD  Encounter Date: 04/25/2015      PT End of Session - 04/25/15 1229    Visit Number 3   Number of Visits 10   Date for PT Re-Evaluation 05/16/15   Authorization Type Medicare    Authorization Time Period 04/18/15-06/13/15   Authorization - Visit Number 3   Authorization - Number of Visits 10   PT Start Time 0940   PT Stop Time 1019   PT Time Calculation (min) 39 min   Activity Tolerance Patient tolerated treatment well;No increased pain   Behavior During Therapy [facility] for tasks assessed/performed      Past Medical History  Diagnosis Date  . Hypertension   . Diabetes mellitus   . COPD (chronic obstructive pulmonary disease)   . Anxiety   . Ulcerative colitis   . Ulcerative colitis   . Hypertriglyceridemia   . Venous stasis   . COPD (chronic obstructive pulmonary disease)   . Tobacco abuse   . Hyperlipidemia   . Stroke     pt states,"they arent sure if i had a stroke in 1997, there is still a question about that with my doctors". No deficits.  . Chronic headaches     constant since possible stroke in 1997,"Vasculitis of brain with increased pressure of brain ".  . Sleep apnea     has but doesnt use, cannot tolerate; PCP aware  . Arthritis   . Cerebral vasculitis     Past Surgical History  Procedure Laterality Date  . Cervical spine surgery  2008  . Shoulder arthroscopy  2004    left shoulder  . Shoulder arthroscopy  2008    right shoulder  . Knee arthroscopy  02/25/2012    Procedure: ARTHROSCOPY KNEE;  Surgeon: Sanjuana Kava, MD;  Location: [facility];  Service: Orthopedics;  Laterality: Right;  . Knee arthroscopy with medial menisectomy Left 12/23/2014    Procedure: LEFT KNEE  ARTHROSCOPY WITH PARTIAL MENISECTOMY;  Surgeon: Sanjuana Kava, MD;  Location: [facility];  Service: Orthopedics;  Laterality: Left;    There were no vitals filed for this visit.  Visit Diagnosis:  Radiculopathy affecting upper extremity  Neck pain  Poor posture  Weakness of right arm  Decreased functional activity tolerance      Subjective Assessment - 04/25/15 0941    Subjective Pt reports pain in neck relatively unchanged, but largely related to specific movements. Pt reports HEP is giong ok.    Pertinent History Hx of anterior cervical fusion of C6/7 in 2008.   Patient Stated Goals get back to PLOF as soon as possible    Currently in Pain? Yes   Pain Score 5    Pain Location Neck   Pain Orientation Left                         [facility] Adult PT Treatment/Exercise - 04/25/15 0001    Neck Exercises: Seated   Money 15 reps;3 secs   Money Limitations verbal cues for T-spine extension, scap retraction/depression.    Other Seated Exercise seated extension over chair  15x, chairback at T7   Neck Exercises: Supine   Neck Retraction 15 reps;3 secs  into two pillows    Prolonged supine  with towel roll perpendicular at T6 level         :    10 minutes Pec minor release on R + scapular backward tilt:                          :      5 minutes PROM median nerve glides/flossing:                                               :     x30 (pain free)  R upper trap Myofascial release:         :      5 minutes Manual cervical traction:                                                                    : 3 minutes            PT Education - 04/25/15 1221    Education provided Yes   Education Details Explained role of posture in symptoms, and asked pt to pay more attention to R shoulder posture, avoiding prolonged protractions, and altering sitting posture to make easier.    Person(s) Educated Patient   Methods Explanation   Comprehension Verbalized understanding           PT Short Term Goals - 04/18/15 1332    PT SHORT TERM GOAL #1   Title Pt will be independent with HEP   Time 3   Period Weeks   Status New   PT SHORT TERM GOAL #2   Title Improve cervical ROM by 10 degrees in all planes to demonstrate improved cervical mobility with decreased pain.   Time 3   Period Weeks   Status New   PT SHORT TERM GOAL #3   Title Improve R shoulder flexion and abduction to 140 or greater to equal L side and to allow for improved function.    Time 3   Period Weeks   Status New           PT Long Term Goals - 04/18/15 1338    PT LONG TERM GOAL #1   Title Pt will be independent with advanced HEP for cervical strengthening, stretching, and postural strengthening.    Time 6   Period Weeks   Status New   PT LONG TERM GOAL #2   Title Improve cervical flexion, extension, and rotation by 15 degrees or more to improve pt's ability to drive and complete ADLs without pain.    Time 6   Period Weeks   Status New   PT LONG TERM GOAL #3   Title Normalize sensation in RUE evidenced by equal light touch sensation at C4 and no tingling present at C7 dermatome   Time 6   Period Weeks   Status New   PT LONG TERM GOAL #4   Title Pt will demonstrate normalized posture without cueing and <1/10 pain throughout entire treatment session to demonstrate improved postural strength.    Time 6   Period Weeks   Status New  Plan - 04/25/15 1230    Clinical Impression Statement Pt is making limited progress toward goals, as evidenced by continued transient aggravation of pain. Attempted to improve pt self efficacy this session with additional education on avoiding flares in symptoms. Pt has good awareness of body in space afte r instuction and is agreeable to try some ew strategies at home. Pt able to complete all therex without increase in pain, however a few activites rtequired modification of position to perform.    Pt will benefit from skilled therapeutic  intervention in order to improve on the following deficits Decreased activity tolerance;Decreased endurance;Decreased range of motion;Decreased strength;Difficulty walking;Increased muscle spasms;Impaired sensation;Postural dysfunction;Pain   Rehab Potential Good   PT Frequency 2x / week   PT Duration 6 weeks   PT Treatment/Interventions ADLs/Self Care Home Management;Electrical Stimulation;Moist Heat;Therapeutic activities;Therapeutic exercise;Patient/family education;Manual techniques;Passive range of motion   PT Next Visit Plan Continue to address thoracic kyphosis with periscapular strengthening, stengthening of extensors, and R pec minor release. Focus on pain free ROM with activities.    PT Home Exercise Plan Encouraged to try a towel roll in chair at home to promoted extension and scapular retraction.    Consulted and Agree with Plan of Care Patient        Problem List Patient Active Problem List   Diagnosis Date Noted  . Venous stasis ulcer ([place]) 03/10/2013  . COPD (chronic obstructive pulmonary disease) ([place]) 03/10/2013  . Tobacco abuse 03/10/2013  . Morbid obesity ([place]) 03/10/2013  . Diabetes ([place]) 03/10/2013  . Diabetes mellitus without complication ([place]) 87/86/7672  . HTN (hypertension) 10/01/2012  . Other and unspecified hyperlipidemia 10/01/2012  . Chronic pain syndrome 10/01/2012  . Knee pain 03/02/2012  . Knee stiffness 03/02/2012    Buccola,Allan C 04/25/2015, 12:37 PM  12:37 PM  Etta Grandchild, PT, DPT [place] License # [id number]       [place] [facility] [address] [place], [place], [address] Phone: [phone]   Fax:  [phone]

## 2015-04-27 ENCOUNTER — Ambulatory Visit (HOSPITAL_COMMUNITY): Payer: Medicare Other

## 2015-04-27 DIAGNOSIS — M542 Cervicalgia: Secondary | ICD-10-CM

## 2015-04-27 DIAGNOSIS — R293 Abnormal posture: Secondary | ICD-10-CM

## 2015-04-27 DIAGNOSIS — R29898 Other symptoms and signs involving the musculoskeletal system: Secondary | ICD-10-CM

## 2015-04-27 DIAGNOSIS — M5410 Radiculopathy, site unspecified: Secondary | ICD-10-CM

## 2015-04-27 DIAGNOSIS — R6889 Other general symptoms and signs: Secondary | ICD-10-CM | POA: Diagnosis not present

## 2015-04-27 NOTE — Therapy (Signed)
[place], [place], [address] Phone: [phone]   Fax:  [phone]  Physical Therapy Treatment  Patient Details  Name: Shaun Harris MRN: [id number] Date of Birth: 1966/02/01 Referring Provider:  Sanjuana Kava, MD  Encounter Date: 04/27/2015      PT End of Session - 04/27/15 1022    Visit Number 4   Number of Visits 10   Date for PT Re-Evaluation 05/16/15   Authorization Type Medicare    Authorization Time Period 04/18/15-06/13/15   Authorization - Visit Number 4   Authorization - Number of Visits 10   PT Start Time 1020   PT Stop Time 1103   PT Time Calculation (min) 43 min   Activity Tolerance Patient tolerated treatment well;No increased pain   Behavior During Therapy [facility] for tasks assessed/performed      Past Medical History  Diagnosis Date  . Hypertension   . Diabetes mellitus   . COPD (chronic obstructive pulmonary disease)   . Anxiety   . Ulcerative colitis   . Ulcerative colitis   . Hypertriglyceridemia   . Venous stasis   . COPD (chronic obstructive pulmonary disease)   . Tobacco abuse   . Hyperlipidemia   . Stroke     pt states,"they arent sure if i had a stroke in 1997, there is still a question about that with my doctors". No deficits.  . Chronic headaches     constant since possible stroke in 1997,"Vasculitis of brain with increased pressure of brain ".  . Sleep apnea     has but doesnt use, cannot tolerate; PCP aware  . Arthritis   . Cerebral vasculitis     Past Surgical History  Procedure Laterality Date  . Cervical spine surgery  2008  . Shoulder arthroscopy  2004    left shoulder  . Shoulder arthroscopy  2008    right shoulder  . Knee arthroscopy  02/25/2012    Procedure: ARTHROSCOPY KNEE;  Surgeon: Sanjuana Kava, MD;  Location: [facility];  Service: Orthopedics;  Laterality: Right;  . Knee arthroscopy with medial menisectomy Left 12/23/2014    Procedure: LEFT KNEE  ARTHROSCOPY WITH PARTIAL MENISECTOMY;  Surgeon: Sanjuana Kava, MD;  Location: [facility];  Service: Orthopedics;  Laterality: Left;    There were no vitals filed for this visit.  Visit Diagnosis:  Radiculopathy affecting upper extremity  Neck pain  Poor posture  Weakness of right arm  Decreased functional activity tolerance      Subjective Assessment - 04/27/15 1019    Subjective Pt stated pain scale really depends on the movement especially with extension.   Currently in Pain? Yes   Pain Score 5    Pain Location Neck   Pain Orientation Right   Pain Descriptors / Indicators Aching            [facility] Adult PT Treatment/Exercise - 04/27/15 0001    Neck Exercises: Standing   Other Standing Exercises Median nerve glides beside wall 5x 10" holds   Neck Exercises: Seated   Money 15 reps;3 secs   Money Limitations verbal cues for T-spine extension, scap retraction/depression.    Shoulder Rolls Backwards;10 reps   Shoulder Rolls Limitations up, back and down   Postural Training Education on importance of proper seated posture   Other Seated Exercise 3D thoracic excursion with arms crossed on chest   Other Seated Exercise seated extension over chair   Neck Exercises: Supine   Neck Retraction 15  reps;3 secs  into two pillows   Other Supine Exercise Prolonged supine with towel roll perpendicular at T6 level : 10 minutes   Manual Therapy   Manual Therapy Other (comment);Manual Traction   Joint Mobilization PROM to improve Rt UE ER and scapular protraction   Soft tissue mobilization supine position cervical mm and upper traps with LE elevated   Manual Traction cervical supine position x 3 minutes   Other Manual Therapy Rt pec minor release            PT Short Term Goals - 04/18/15 1332    PT SHORT TERM GOAL #1   Title Pt will be independent with HEP   Time 3   Period Weeks   Status New   PT SHORT TERM GOAL #2   Title Improve cervical ROM by 10 degrees in all  planes to demonstrate improved cervical mobility with decreased pain.   Time 3   Period Weeks   Status New   PT SHORT TERM GOAL #3   Title Improve R shoulder flexion and abduction to 140 or greater to equal L side and to allow for improved function.    Time 3   Period Weeks   Status New           PT Long Term Goals - 04/18/15 1338    PT LONG TERM GOAL #1   Title Pt will be independent with advanced HEP for cervical strengthening, stretching, and postural strengthening.    Time 6   Period Weeks   Status New   PT LONG TERM GOAL #2   Title Improve cervical flexion, extension, and rotation by 15 degrees or more to improve pt's ability to drive and complete ADLs without pain.    Time 6   Period Weeks   Status New   PT LONG TERM GOAL #3   Title Normalize sensation in RUE evidenced by equal light touch sensation at C4 and no tingling present at C7 dermatome   Time 6   Period Weeks   Status New   PT LONG TERM GOAL #4   Title Pt will demonstrate normalized posture without cueing and <1/10 pain throughout entire treatment session to demonstrate improved postural strength.    Time 6   Period Weeks   Status New               Plan - 04/27/15 1047    Clinical Impression Statement Session focus on improving cervical ROM pain free range and scapular strengthening with education on importance of posture especially to reduce Rt shoulder protraction.  Included manual techniques to reduce spasms upper traps and posterior cervical musculature with towel rolls T7  to promote extension in pain free range.  No reports of increased pain or increased tingling to fingers Rt UE through session.   PT Next Visit Plan Continue to address thoracic kyphosis with periscapular strengthening, stengthening of extensors, and R pec minor release. Focus on pain free ROM with activities.         Problem List Patient Active Problem List   Diagnosis Date Noted  . Venous stasis ulcer ([place]) 03/10/2013   . COPD (chronic obstructive pulmonary disease) ([place]) 03/10/2013  . Tobacco abuse 03/10/2013  . Morbid obesity ([place]) 03/10/2013  . Diabetes ([place]) 03/10/2013  . Diabetes mellitus without complication ([place]) 75/17/0017  . HTN (hypertension) 10/01/2012  . Other and unspecified hyperlipidemia 10/01/2012  . Chronic pain syndrome 10/01/2012  . Knee pain 03/02/2012  . Knee stiffness 03/02/2012  [address], LPTA; [place]   Aldona Lento 04/27/2015, 11:02 AM  [place], [place], [address] Phone: [phone]   Fax:  [phone]

## 2015-05-02 ENCOUNTER — Ambulatory Visit (HOSPITAL_COMMUNITY): Payer: Medicare Other

## 2015-05-02 DIAGNOSIS — M542 Cervicalgia: Secondary | ICD-10-CM | POA: Diagnosis not present

## 2015-05-02 DIAGNOSIS — R6889 Other general symptoms and signs: Secondary | ICD-10-CM | POA: Diagnosis not present

## 2015-05-02 DIAGNOSIS — R29898 Other symptoms and signs involving the musculoskeletal system: Secondary | ICD-10-CM | POA: Diagnosis not present

## 2015-05-02 DIAGNOSIS — M5410 Radiculopathy, site unspecified: Secondary | ICD-10-CM

## 2015-05-02 DIAGNOSIS — R293 Abnormal posture: Secondary | ICD-10-CM

## 2015-05-02 NOTE — Therapy (Signed)
[facility] [address] [place], [place], [address] Phone: [phone]   Fax:  [phone]  Physical Therapy Treatment  Patient Details  Name: Shaun Harris MRN: [id number] Date of Birth: 02/12/1966 No Data Recorded  Encounter Date: 05/02/2015      PT End of Session - 05/02/15 6144    Visit Number 5   Number of Visits 10   Date for PT Re-Evaluation 05/16/15   Authorization Type Medicare    Authorization Time Period 04/18/15-06/13/15   Authorization - Visit Number 5   Authorization - Number of Visits 10   PT Start Time 3154   PT Stop Time 0086   PT Time Calculation (min) 40 min   Activity Tolerance Patient tolerated treatment well;No increased pain   Behavior During Therapy [facility] for tasks assessed/performed      Past Medical History  Diagnosis Date  . Hypertension   . Diabetes mellitus   . COPD (chronic obstructive pulmonary disease)   . Anxiety   . Ulcerative colitis   . Ulcerative colitis   . Hypertriglyceridemia   . Venous stasis   . COPD (chronic obstructive pulmonary disease)   . Tobacco abuse   . Hyperlipidemia   . Stroke     pt states,"they arent sure if i had a stroke in 1997, there is still a question about that with my doctors". No deficits.  . Chronic headaches     constant since possible stroke in 1997,"Vasculitis of brain with increased pressure of brain ".  . Sleep apnea     has but doesnt use, cannot tolerate; PCP aware  . Arthritis   . Cerebral vasculitis     Past Surgical History  Procedure Laterality Date  . Cervical spine surgery  2008  . Shoulder arthroscopy  2004    left shoulder  . Shoulder arthroscopy  2008    right shoulder  . Knee arthroscopy  02/25/2012    Procedure: ARTHROSCOPY KNEE;  Surgeon: Sanjuana Kava, MD;  Location: [facility];  Service: Orthopedics;  Laterality: Right;  . Knee arthroscopy with medial menisectomy Left 12/23/2014    Procedure: LEFT KNEE ARTHROSCOPY WITH PARTIAL  MENISECTOMY;  Surgeon: Sanjuana Kava, MD;  Location: [facility];  Service: Orthopedics;  Laterality: Left;    There were no vitals filed for this visit.  Visit Diagnosis:  Radiculopathy affecting upper extremity  Neck pain  Poor posture  Weakness of right arm  Decreased functional activity tolerance      Subjective Assessment - 05/02/15 0937    Subjective Pt stated pain scale 4-5/10right posterior neck, very achey this morning   Currently in Pain? Yes   Pain Score 5    Pain Location Neck   Pain Orientation Right;Posterior                         [facility] Adult PT Treatment/Exercise - 05/02/15 0001    Neck Exercises: Theraband   Shoulder Extension 10 reps;Red   Rows 10 reps;Red   Neck Exercises: Seated   Money 15 reps;3 secs   Money Limitations verbal cues for T-spine extension, scap retraction/depression.    Shoulder Rolls Backwards;10 reps   Shoulder Rolls Limitations up, back and down   Other Seated Exercise 3D thoracic excursion with arms crossed on chest   Manual Therapy   Manual Therapy Myofascial release;Soft tissue mobilization   Soft tissue mobilization Supine with LE elevated focus on pec major/minor, Bil Upper traps and posterior  cervical mm; anterilor and medial scalenes   Other Manual Therapy Rt pec minor release             PT Short Term Goals - 04/18/15 1332    PT SHORT TERM GOAL #1   Title Pt will be independent with HEP   Time 3   Period Weeks   Status New   PT SHORT TERM GOAL #2   Title Improve cervical ROM by 10 degrees in all planes to demonstrate improved cervical mobility with decreased pain.   Time 3   Period Weeks   Status New   PT SHORT TERM GOAL #3   Title Improve R shoulder flexion and abduction to 140 or greater to equal L side and to allow for improved function.    Time 3   Period Weeks   Status New           PT Long Term Goals - 04/18/15 1338    PT LONG TERM GOAL #1   Title Pt will be independent with  advanced HEP for cervical strengthening, stretching, and postural strengthening.    Time 6   Period Weeks   Status New   PT LONG TERM GOAL #2   Title Improve cervical flexion, extension, and rotation by 15 degrees or more to improve pt's ability to drive and complete ADLs without pain.    Time 6   Period Weeks   Status New   PT LONG TERM GOAL #3   Title Normalize sensation in RUE evidenced by equal light touch sensation at C4 and no tingling present at C7 dermatome   Time 6   Period Weeks   Status New   PT LONG TERM GOAL #4   Title Pt will demonstrate normalized posture without cueing and <1/10 pain throughout entire treatment session to demonstrate improved postural strength.    Time 6   Period Weeks   Status New               Plan - 05/02/15 0939    Clinical Impression Statement Pt entered dept with forward rolled shoulders Rt >LT, hyperkyphotic posture and elevated shoulders with reports of increased pain Rt neck, shoulder and radicular symptoms to Rt UE fingers.  Began session with education on importance of proper posture to improve spinal alignment and increased relaxation to musculature.  Thoracic excursions, postural shoulder rolls and scapular strenghtening exercises complete to improve posture.  Manual techniques complete to reduce tension with noted increased tightness Rt upper traps, Bil anterior and mid Scalenes, posterior cervical musculature and pec major and minor.  Noted increased tension to Rt pec minor, manual Rt pec minor release was complete.  End of session noted improved scapuar protraction, reduced forward head and improved cervical rotation.  No reports of increased pain or tingling to fingers of Rt UE through session.   PT Next Visit Plan Continue to address thoracic kyphosis with periscapular strengthening, stengthening of extensors, and R pec minor release. Focus on pain free ROM with activities.         Problem List Patient Active Problem List    Diagnosis Date Noted  . Venous stasis ulcer ([place]) 03/10/2013  . COPD (chronic obstructive pulmonary disease) ([place]) 03/10/2013  . Tobacco abuse 03/10/2013  . Morbid obesity ([place]) 03/10/2013  . Diabetes ([place]) 03/10/2013  . Diabetes mellitus without complication ([place]) 13/24/4010  . HTN (hypertension) 10/01/2012  . Other and unspecified hyperlipidemia 10/01/2012  . Chronic pain syndrome 10/01/2012  . Knee pain 03/02/2012  .  Knee stiffness 03/02/2012   Ihor Austin, LPTA; CBIS [phone]  Aldona Lento 05/02/2015, 1:44 PM  [facility] [address] [place], [place], [address] Phone: [phone]   Fax:  [phone]  Name: Shaun Harris MRN: [id number] Date of Birth: 1966-05-30

## 2015-05-04 ENCOUNTER — Ambulatory Visit (HOSPITAL_COMMUNITY): Payer: Medicare Other | Admitting: Physical Therapy

## 2015-05-04 DIAGNOSIS — R6889 Other general symptoms and signs: Secondary | ICD-10-CM | POA: Diagnosis not present

## 2015-05-04 DIAGNOSIS — M5410 Radiculopathy, site unspecified: Secondary | ICD-10-CM

## 2015-05-04 DIAGNOSIS — R29898 Other symptoms and signs involving the musculoskeletal system: Secondary | ICD-10-CM | POA: Diagnosis not present

## 2015-05-04 DIAGNOSIS — R293 Abnormal posture: Secondary | ICD-10-CM

## 2015-05-04 DIAGNOSIS — M542 Cervicalgia: Secondary | ICD-10-CM | POA: Diagnosis not present

## 2015-05-04 NOTE — Therapy (Signed)
[facility] [place], [place], [address] Phone: [phone]   Fax:  [phone]  Physical Therapy Treatment  Patient Details  Name: Shaun Harris MRN: [id number] Date of Birth: [birth year] Referring Provider: Dr. Luna Glasgow  Encounter Date: 05/04/2015      PT End of Session - 05/04/15 1026    Visit Number 6   Number of Visits 10   Date for PT Re-Evaluation 05/16/15   Authorization Type Medicare    Authorization Time Period 04/18/15-06/13/15   Authorization - Visit Number 6   Authorization - Number of Visits 10   PT Start Time 0932   PT Stop Time 1012   PT Time Calculation (min) 40 min   Activity Tolerance Patient tolerated treatment well   Behavior During Therapy [facility] for tasks assessed/performed      Past Medical History  Diagnosis Date  . Hypertension   . Diabetes mellitus   . COPD (chronic obstructive pulmonary disease)   . Anxiety   . Ulcerative colitis   . Ulcerative colitis   . Hypertriglyceridemia   . Venous stasis   . COPD (chronic obstructive pulmonary disease)   . Tobacco abuse   . Hyperlipidemia   . Stroke     pt states,"they arent sure if i had a stroke in 1997, there is still a question about that with my doctors". No deficits.  . Chronic headaches     constant since possible stroke in 1997,"Vasculitis of brain with increased pressure of brain ".  . Sleep apnea     has but doesnt use, cannot tolerate; PCP aware  . Arthritis   . Cerebral vasculitis     Past Surgical History  Procedure Laterality Date  . Cervical spine surgery  2008  . Shoulder arthroscopy  2004    left shoulder  . Shoulder arthroscopy  2008    right shoulder  . Knee arthroscopy  02/25/2012    Procedure: ARTHROSCOPY KNEE;  Surgeon: Sanjuana Kava, MD;  Location: [facility];  Service: Orthopedics;  Laterality: Right;  . Knee arthroscopy with medial menisectomy Left 12/23/2014    Procedure: LEFT KNEE ARTHROSCOPY WITH PARTIAL  MENISECTOMY;  Surgeon: Sanjuana Kava, MD;  Location: [facility];  Service: Orthopedics;  Laterality: Left;    There were no vitals filed for this visit.  Visit Diagnosis:  Radiculopathy affecting upper extremity  Neck pain  Poor posture      Subjective Assessment - 05/04/15 0936    Subjective Pt reports that his neck is still hurting today, rates pain as a 4-5/10   Currently in Pain? Yes   Pain Score 5    Pain Location Neck   Pain Orientation Right;Posterior            [facility] PT Assessment - 05/04/15 0001    Assessment   Referring Provider Dr. Hazle Nordmann Adult PT Treatment/Exercise - 05/04/15 0001    Neck Exercises: Theraband   Scapula Retraction 15 reps;Green   Shoulder Extension 15 reps;Green   Rows 15 reps;Green   Neck Exercises: Standing   Other Standing Exercises wall slide with lift off x 15   Other Standing Exercises corner stretch 3x30"   Neck Exercises: Seated   Neck Retraction 15 reps   Neck Retraction Limitations verbal and tactile cueing   Cervical Rotation 10 reps   Cervical Rotation Limitations 3D cervical excursion  Lateral Flexion 10 reps   Lateral Flexion Limitations 3D cervical excursion   Shoulder Rolls Backwards;10 reps   Shoulder Rolls Limitations up, back and down   Other Seated Exercise 3D thoracic excursion with arms crossed on chest   Other Seated Exercise seated upper trap stretch 20" x 2                  PT Short Term Goals - 04/18/15 1332    PT SHORT TERM GOAL #1   Title Pt will be independent with HEP   Time 3   Period Weeks   Status New   PT SHORT TERM GOAL #2   Title Improve cervical ROM by 10 degrees in all planes to demonstrate improved cervical mobility with decreased pain.   Time 3   Period Weeks   Status New   PT SHORT TERM GOAL #3   Title Improve R shoulder flexion and abduction to 140 or greater to equal L side and to allow for improved function.    Time 3   Period Weeks    Status New           PT Long Term Goals - 04/18/15 1338    PT LONG TERM GOAL #1   Title Pt will be independent with advanced HEP for cervical strengthening, stretching, and postural strengthening.    Time 6   Period Weeks   Status New   PT LONG TERM GOAL #2   Title Improve cervical flexion, extension, and rotation by 15 degrees or more to improve pt's ability to drive and complete ADLs without pain.    Time 6   Period Weeks   Status New   PT LONG TERM GOAL #3   Title Normalize sensation in RUE evidenced by equal light touch sensation at C4 and no tingling present at C7 dermatome   Time 6   Period Weeks   Status New   PT LONG TERM GOAL #4   Title Pt will demonstrate normalized posture without cueing and <1/10 pain throughout entire treatment session to demonstrate improved postural strength.    Time 6   Period Weeks   Status New               Plan - 05/04/15 1026    Clinical Impression Statement Pt continues to present with forward head rounded shoulders posture. Verbal cueing was needed with lateral cervical flexion AROM to decrease rotation. Theraband retraction, wall slide with lift off, and corner stretch were all added today to improve posture and decrease neck pain. Pt required verbal cueing with all new therex for proper form and to avoid compensation with lumbar spine movement.    PT Next Visit Plan Continue with postural strengthening, manual PRN        Problem List Patient Active Problem List   Diagnosis Date Noted  . Venous stasis ulcer ([place]) 03/10/2013  . COPD (chronic obstructive pulmonary disease) ([place]) 03/10/2013  . Tobacco abuse 03/10/2013  . Morbid obesity ([place]) 03/10/2013  . Diabetes ([place]) 03/10/2013  . Diabetes mellitus without complication ([place]) 29/92/4268  . HTN (hypertension) 10/01/2012  . Other and unspecified hyperlipidemia 10/01/2012  . Chronic pain syndrome 10/01/2012  . Knee pain 03/02/2012  . Knee stiffness 03/02/2012     Hilma Favors, PT, DPT [phone] 05/04/2015, 10:34 AM  [facility] [address] [place], [place], [address] Phone: [phone]   Fax:  [phone]  Name: Shaun Harris MRN: [id number] Date of Birth:  12/13/1965     

## 2015-05-09 ENCOUNTER — Other Ambulatory Visit: Payer: Self-pay | Admitting: Family Medicine

## 2015-05-09 ENCOUNTER — Ambulatory Visit (HOSPITAL_COMMUNITY): Payer: Medicare Other

## 2015-05-09 DIAGNOSIS — M542 Cervicalgia: Secondary | ICD-10-CM

## 2015-05-09 DIAGNOSIS — R6889 Other general symptoms and signs: Secondary | ICD-10-CM

## 2015-05-09 DIAGNOSIS — M5410 Radiculopathy, site unspecified: Secondary | ICD-10-CM

## 2015-05-09 DIAGNOSIS — R29898 Other symptoms and signs involving the musculoskeletal system: Secondary | ICD-10-CM | POA: Diagnosis not present

## 2015-05-09 DIAGNOSIS — R293 Abnormal posture: Secondary | ICD-10-CM | POA: Diagnosis not present

## 2015-05-09 NOTE — Therapy (Signed)
[facility] [place], [place], [address] Phone: [phone]   Fax:  [phone]  Physical Therapy Treatment  Patient Details  Name: Shaun Harris MRN: [id number] Date of Birth: [birth year] Referring Provider: Dr. Luna Glasgow  Encounter Date: 05/09/2015      PT End of Session - 05/09/15 1012    Visit Number 7   Number of Visits 10   Date for PT Re-Evaluation 05/16/15   Authorization Type Medicare    Authorization Time Period 04/18/15-06/13/15   Authorization - Visit Number 7   Authorization - Number of Visits 10   PT Start Time 0936   PT Stop Time 1007   PT Time Calculation (min) 31 min   Activity Tolerance Patient tolerated treatment well;No increased pain   Behavior During Therapy [facility] for tasks assessed/performed;Flat affect      Past Medical History  Diagnosis Date  . Hypertension   . Diabetes mellitus   . COPD (chronic obstructive pulmonary disease)   . Anxiety   . Ulcerative colitis   . Ulcerative colitis   . Hypertriglyceridemia   . Venous stasis   . COPD (chronic obstructive pulmonary disease)   . Tobacco abuse   . Hyperlipidemia   . Stroke     pt states,"they arent sure if i had a stroke in 1997, there is still a question about that with my doctors". No deficits.  . Chronic headaches     constant since possible stroke in 1997,"Vasculitis of brain with increased pressure of brain ".  . Sleep apnea     has but doesnt use, cannot tolerate; PCP aware  . Arthritis   . Cerebral vasculitis     Past Surgical History  Procedure Laterality Date  . Cervical spine surgery  2008  . Shoulder arthroscopy  2004    left shoulder  . Shoulder arthroscopy  2008    right shoulder  . Knee arthroscopy  02/25/2012    Procedure: ARTHROSCOPY KNEE;  Surgeon: Sanjuana Kava, MD;  Location: [facility];  Service: Orthopedics;  Laterality: Right;  . Knee arthroscopy with medial menisectomy Left 12/23/2014    Procedure: LEFT KNEE  ARTHROSCOPY WITH PARTIAL MENISECTOMY;  Surgeon: Sanjuana Kava, MD;  Location: [facility];  Service: Orthopedics;  Laterality: Left;    There were no vitals filed for this visit.  Visit Diagnosis:  Radiculopathy affecting upper extremity  Neck pain  Poor posture  Weakness of right arm  Decreased functional activity tolerance      Subjective Assessment - 05/09/15 0942    Subjective Pt reports no major changes to his symptoms or pain. He says it is difficult to notice any change from therapy over past few sessions. He also reports that his overall efficacy in controlling symptoms seems unchanged.    Pertinent History Hx of anterior cervical fusion of C6/7 in 2008.   Patient Stated Goals get back to PLOF as soon as possible    Currently in Pain? Yes   Pain Score 5    Pain Location Neck   Pain Orientation Right;Posterior   Pain Descriptors / Indicators Aching                         [facility] Adult PT Treatment/Exercise - 05/09/15 0001    Neck Exercises: Seated   Other Seated Exercise 3D thoracic excursion with arms crossed on chest  20x ea direcrtion   Neck Exercises: Supine   Neck Retraction 15 reps;3 secs  into two pillows, lying on towel roll. at T5   X to V Limitations 20x supine with green physioball   Shoulder Flexion Other (comment)   Shoulder ABduction Both;Other reps (comment)  2x12, green TB T's elbows straight   Upper Extremity D1 Theraband;Extension  2x12 bilat Green TB, lying on blue half foam rol   Other Supine Exercise Prolonged supine with towel roll perpendicular at T6 level : 10 minutes   Manual Therapy   Joint Mobilization Cervical lateral glides:   2x45s, grade IV bilat c4/5   Other Manual Therapy Rt pec minor release                PT Education - 05/09/15 1008    Education provided Yes   Education Details Encouraged to continue to pay more attention to R scapular positioning at home while seated.    Person(s) Educated  Patient   Methods Explanation   Comprehension Verbalized understanding          PT Short Term Goals - 04/18/15 1332    PT SHORT TERM GOAL #1   Title Pt will be independent with HEP   Time 3   Period Weeks   Status New   PT SHORT TERM GOAL #2   Title Improve cervical ROM by 10 degrees in all planes to demonstrate improved cervical mobility with decreased pain.   Time 3   Period Weeks   Status New   PT SHORT TERM GOAL #3   Title Improve R shoulder flexion and abduction to 140 or greater to equal L side and to allow for improved function.    Time 3   Period Weeks   Status New           PT Long Term Goals - 04/18/15 1338    PT LONG TERM GOAL #1   Title Pt will be independent with advanced HEP for cervical strengthening, stretching, and postural strengthening.    Time 6   Period Weeks   Status New   PT LONG TERM GOAL #2   Title Improve cervical flexion, extension, and rotation by 15 degrees or more to improve pt's ability to drive and complete ADLs without pain.    Time 6   Period Weeks   Status New   PT LONG TERM GOAL #3   Title Normalize sensation in RUE evidenced by equal light touch sensation at C4 and no tingling present at C7 dermatome   Time 6   Period Weeks   Status New   PT LONG TERM GOAL #4   Title Pt will demonstrate normalized posture without cueing and <1/10 pain throughout entire treatment session to demonstrate improved postural strength.    Time 6   Period Weeks   Status New               Plan - 05/09/15 1014    Clinical Impression Statement Pt demonstrating improvements in posture and improved tolerance to passive throacic mobility stretching. Scapular strengthening continues to improve, however tolerance to activity at home still unchanged.    Pt will benefit from skilled therapeutic intervention in order to improve on the following deficits Decreased activity tolerance;Decreased endurance;Decreased range of motion;Decreased  strength;Difficulty walking;Increased muscle spasms;Impaired sensation;Postural dysfunction;Pain   Rehab Potential Good   PT Frequency 2x / week   PT Duration 6 weeks   PT Treatment/Interventions ADLs/Self Care Home Management;Electrical Stimulation;Moist Heat;Therapeutic activities;Therapeutic exercise;Patient/family education;Manual techniques;Passive range of motion   PT Next Visit Plan Continue with postural strengthening, manual  PRN   PT Home Exercise Plan Encouraged to try a towel roll in chair at home to promoted extension and scapular retraction.    Consulted and Agree with Plan of Care Patient        Problem List Patient Active Problem List   Diagnosis Date Noted  . Venous stasis ulcer ([place]) 03/10/2013  . COPD (chronic obstructive pulmonary disease) ([place]) 03/10/2013  . Tobacco abuse 03/10/2013  . Morbid obesity ([place]) 03/10/2013  . Diabetes ([place]) 03/10/2013  . Diabetes mellitus without complication ([place]) 32/20/2542  . HTN (hypertension) 10/01/2012  . Other and unspecified hyperlipidemia 10/01/2012  . Chronic pain syndrome 10/01/2012  . Knee pain 03/02/2012  . Knee stiffness 03/02/2012    Buccola,Allan C 05/09/2015, 12:23 PM  12:23 PM  Etta Grandchild, PT, DPT [place] License # [id number]       [facility] [address] [place], [place], [address] Phone: [phone]   Fax:  [phone]  Name: GREOGORY CORNETTE MRN: [id number] Date of Birth: [birth year]

## 2015-05-10 ENCOUNTER — Other Ambulatory Visit: Payer: Self-pay | Admitting: *Deleted

## 2015-05-10 MED ORDER — ATORVASTATIN CALCIUM 20 MG PO TABS: ORAL_TABLET | ORAL | Status: DC

## 2015-05-10 MED ORDER — ENALAPRIL MALEATE 20 MG PO TABS
ORAL_TABLET | ORAL | Status: DC
Start: 2015-05-10 — End: 2015-08-14

## 2015-05-10 MED ORDER — LEVETIRACETAM 500 MG PO TABS: 1500.0000 mg | ORAL_TABLET | Freq: Two times a day (BID) | ORAL | Status: DC

## 2015-05-10 MED ORDER — GLIPIZIDE ER 10 MG PO TB24: ORAL_TABLET | ORAL | Status: DC

## 2015-05-10 MED ORDER — TERAZOSIN HCL 10 MG PO CAPS: ORAL_CAPSULE | ORAL | Status: DC

## 2015-05-11 ENCOUNTER — Ambulatory Visit (HOSPITAL_COMMUNITY): Payer: Medicare Other

## 2015-05-11 DIAGNOSIS — M542 Cervicalgia: Secondary | ICD-10-CM

## 2015-05-11 DIAGNOSIS — R293 Abnormal posture: Secondary | ICD-10-CM | POA: Diagnosis not present

## 2015-05-11 DIAGNOSIS — M5410 Radiculopathy, site unspecified: Secondary | ICD-10-CM

## 2015-05-11 DIAGNOSIS — R6889 Other general symptoms and signs: Secondary | ICD-10-CM

## 2015-05-11 DIAGNOSIS — R29898 Other symptoms and signs involving the musculoskeletal system: Secondary | ICD-10-CM

## 2015-05-11 NOTE — Therapy (Signed)
[place], [place], [address] Phone: [phone]   Fax:  [phone]  Physical Therapy Treatment  Patient Details  Name: Shaun Harris MRN: [id number] Date of Birth: Aug 31, 1965 Referring Provider: Dr Luna Glasgow  Encounter Date: 05/11/2015      PT End of Session - 05/11/15 0939    Visit Number 8   Number of Visits 10   Date for PT Re-Evaluation 05/16/15   Authorization Type Medicare    Authorization Time Period 04/18/15-06/13/15   Authorization - Visit Number 8   Authorization - Number of Visits 10   PT Start Time 0932   PT Stop Time 1022   PT Time Calculation (min) 50 min   Activity Tolerance Patient tolerated treatment well;No increased pain   Behavior During Therapy [facility] for tasks assessed/performed;Flat affect      Past Medical History  Diagnosis Date  . Hypertension   . Diabetes mellitus   . COPD (chronic obstructive pulmonary disease)   . Anxiety   . Ulcerative colitis   . Ulcerative colitis   . Hypertriglyceridemia   . Venous stasis   . COPD (chronic obstructive pulmonary disease)   . Tobacco abuse   . Hyperlipidemia   . Stroke     pt states,"they arent sure if i had a stroke in 1997, there is still a question about that with my doctors". No deficits.  . Chronic headaches     constant since possible stroke in 1997,"Vasculitis of brain with increased pressure of brain ".  . Sleep apnea     has but doesnt use, cannot tolerate; PCP aware  . Arthritis   . Cerebral vasculitis     Past Surgical History  Procedure Laterality Date  . Cervical spine surgery  2008  . Shoulder arthroscopy  2004    left shoulder  . Shoulder arthroscopy  2008    right shoulder  . Knee arthroscopy  02/25/2012    Procedure: ARTHROSCOPY KNEE;  Surgeon: Sanjuana Kava, MD;  Location: [facility];  Service: Orthopedics;  Laterality: Right;  . Knee arthroscopy with medial menisectomy Left 12/23/2014    Procedure: LEFT KNEE  ARTHROSCOPY WITH PARTIAL MENISECTOMY;  Surgeon: Sanjuana Kava, MD;  Location: [facility];  Service: Orthopedics;  Laterality: Left;    There were no vitals filed for this visit.  Visit Diagnosis:  Radiculopathy affecting upper extremity  Neck pain  Poor posture  Weakness of right arm  Decreased functional activity tolerance      Subjective Assessment - 05/11/15 0935    Subjective Pt stated his pain stays around the same level, reports he does not feel he has made any improvements with therapy outside of the massages,   How long can you sit comfortably? Able to sit comfortably for 30 minutes though constant pain (was <5 minutes)   How long can you stand comfortably? Able to stand comfortably for <5 minutes   How long can you walk comfortably? Able to walk comfortably for 30-45 minutes   Patient Stated Goals get back to PLOF as soon as possible    Currently in Pain? Yes   Pain Score 5    Pain Location Neck   Pain Orientation Right;Posterior   Pain Descriptors / Indicators Aching;Sore   Pain Radiating Towards Radiating pain down spine and Rt UE fingertips            [facility] PT Assessment - 05/11/15 0001    Assessment   Medical Diagnosis Neck pain  Referring Provider Dr Luna Glasgow   Onset Date/Surgical Date 12/23/14   Next MD Visit Luna Glasgow 05/15/2015   Observation/Other Assessments   Focus on Therapeutic Outcomes (FOTO)  56% limited was 59%   Posture/Postural Control   Posture/Postural Control Postural limitations   Postural Limitations Forward head;Rounded Shoulders   ROM / Strength   AROM / PROM / Strength AROM;Strength   AROM   AROM Assessment Site Cervical;Shoulder   Right/Left Shoulder Right;Left   Right Shoulder Flexion 164 Degrees  was 120   Right Shoulder ABduction 150 Degrees  was 125   Right Shoulder Internal Rotation --  T12 was L3   Right Shoulder External Rotation --  T3 was C7   Left Shoulder Flexion 155 Degrees  was 140   Left Shoulder ABduction 151  Degrees  was 145   Left Shoulder Internal Rotation --  T12   Left Shoulder External Rotation --  T2 was C7   Cervical Flexion 28  was 30   Cervical Extension 28  was 26   Cervical - Right Side Bend 16  was 19   Cervical - Left Side Bend 30  was 25   Cervical - Right Rotation 42  was 45   Cervical - Left Rotation 46  was 46   Strength   Right Hand Grip (lbs) 52  was 50   Left Hand Grip (lbs) 52  was 51            [facility] Adult PT Treatment/Exercise - 05/11/15 0001    Neck Exercises: Theraband   Scapula Retraction 15 reps;Green   Shoulder Extension 15 reps;Green   Rows 15 reps;Green   Neck Exercises: Standing   Other Standing Exercises wall slide with lift off x 15   Other Standing Exercises corner stretch 3x30"   Neck Exercises: Seated   Neck Retraction 15 reps   Neck Retraction Limitations verbal and tactile cueing   Cervical Rotation 10 reps   Cervical Rotation Limitations 3D cervical excursion   Lateral Flexion 10 reps   Lateral Flexion Limitations 3D cervical excursion   Shoulder Rolls Backwards;10 reps   Shoulder Rolls Limitations up, back and down   Other Seated Exercise 3D thoracic excursion with arms crossed on chest   Manual Therapy   Manual Therapy Myofascial release;Soft tissue mobilization;Manual Traction   Soft tissue mobilization Supine with LE elevated focus on pec major/minor, Bil Upper traps and posterior cervical mm; anterilor and medial scalenes   Manual Traction cervical supine position x 3 minutes                  PT Short Term Goals - 05/11/15 0939    PT SHORT TERM GOAL #1   Title Pt will be independent with HEP   Baseline Reports compliance daily   PT SHORT TERM GOAL #2   Title Improve cervical ROM by 10 degrees in all planes to demonstrate improved cervical mobility with decreased pain.   Status On-going   PT SHORT TERM GOAL #3   Title Improve R shoulder flexion and abduction to 140 or greater to equal L side and to allow  for improved function.    Status Achieved           PT Long Term Goals - 05/11/15 0940    PT LONG TERM GOAL #1   Title Pt will be independent with advanced HEP for cervical strengthening, stretching, and postural strengthening.    Status Achieved   PT LONG TERM GOAL #2  Title Improve cervical flexion, extension, and rotation by 15 degrees or more to improve pt's ability to drive and complete ADLs without pain.    Status On-going   PT LONG TERM GOAL #3   Title Normalize sensation in RUE evidenced by equal light touch sensation at C4 and no tingling present at C7 dermatome   Status On-going   PT LONG TERM GOAL #4   Title Pt will demonstrate normalized posture without cueing and <1/10 pain throughout entire treatment session to demonstrate improved postural strength.    Status On-going               Plan - 05/11/15 1007    Clinical Impression Statement Reassessment complete prior MD apt next week with the following findings:  Pt reports compliance with HEP daily.  Reports no improvements with pain or tingling down spine or Rt UE though does report relief during manual therapy but no relief following.  Pt with no improved cervical ROM but significant improvements with Bil UE ROM.  Pt has been educated on importance of posture and able to verbalize importance though continues to present with forward head and forward rollled shoulders requiring cueing to improve posture.  Pt given advanced HEP for postural strengthening.  Following discussion with pt. decision made to discharge as pt. continues to be limited by pain with radicular symptoms and reports no improvements with therapy.  F/U with MD.   PT Next Visit Plan Recommend D/C to HEP per minimal improvements over last 3 weeks.        Problem List Patient Active Problem List   Diagnosis Date Noted  . Venous stasis ulcer ([place]) 03/10/2013  . COPD (chronic obstructive pulmonary disease) ([place]) 03/10/2013  . Tobacco abuse  03/10/2013  . Morbid obesity ([place]) 03/10/2013  . Diabetes ([place]) 03/10/2013  . Diabetes mellitus without complication ([place]) 72/62/0355  . HTN (hypertension) 10/01/2012  . Other and unspecified hyperlipidemia 10/01/2012  . Chronic pain syndrome 10/01/2012  . Knee pain 03/02/2012  . Knee stiffness 03/02/2012   Ihor Austin, LPTA; CBIS [id number]  Aldona Lento 05/11/2015, 11:58 AM  [place], [place], [address] Phone: [phone]   Fax:  [phone]  Name: Shaun Harris MRN: [id number] Date of Birth: Oct 20, 1965

## 2015-05-15 DIAGNOSIS — Z6841 Body Mass Index (BMI) 40.0 and over, adult: Secondary | ICD-10-CM | POA: Diagnosis not present

## 2015-05-15 DIAGNOSIS — J449 Chronic obstructive pulmonary disease, unspecified: Secondary | ICD-10-CM | POA: Diagnosis not present

## 2015-05-15 DIAGNOSIS — R600 Localized edema: Secondary | ICD-10-CM | POA: Diagnosis not present

## 2015-05-15 DIAGNOSIS — F17200 Nicotine dependence, unspecified, uncomplicated: Secondary | ICD-10-CM | POA: Diagnosis not present

## 2015-05-15 DIAGNOSIS — E119 Type 2 diabetes mellitus without complications: Secondary | ICD-10-CM | POA: Diagnosis not present

## 2015-05-15 DIAGNOSIS — I10 Essential (primary) hypertension: Secondary | ICD-10-CM | POA: Diagnosis not present

## 2015-05-15 DIAGNOSIS — Z8709 Personal history of other diseases of the respiratory system: Secondary | ICD-10-CM | POA: Diagnosis not present

## 2015-05-15 DIAGNOSIS — M542 Cervicalgia: Secondary | ICD-10-CM | POA: Diagnosis not present

## 2015-05-16 ENCOUNTER — Ambulatory Visit (HOSPITAL_COMMUNITY): Payer: Medicare Other | Attending: Orthopaedic Surgery | Admitting: Physical Therapy

## 2015-05-18 ENCOUNTER — Encounter (HOSPITAL_COMMUNITY): Payer: Medicare Other | Admitting: Physical Therapy

## 2015-05-19 ENCOUNTER — Other Ambulatory Visit: Payer: Self-pay | Admitting: Family Medicine

## 2015-05-23 ENCOUNTER — Encounter: Payer: Self-pay | Admitting: *Deleted

## 2015-06-12 DIAGNOSIS — R600 Localized edema: Secondary | ICD-10-CM | POA: Diagnosis not present

## 2015-06-12 DIAGNOSIS — Z8709 Personal history of other diseases of the respiratory system: Secondary | ICD-10-CM | POA: Diagnosis not present

## 2015-06-12 DIAGNOSIS — I10 Essential (primary) hypertension: Secondary | ICD-10-CM | POA: Diagnosis not present

## 2015-06-12 DIAGNOSIS — Z6841 Body Mass Index (BMI) 40.0 and over, adult: Secondary | ICD-10-CM | POA: Diagnosis not present

## 2015-06-12 DIAGNOSIS — M1712 Unilateral primary osteoarthritis, left knee: Secondary | ICD-10-CM | POA: Diagnosis not present

## 2015-06-12 DIAGNOSIS — M25562 Pain in left knee: Secondary | ICD-10-CM | POA: Diagnosis not present

## 2015-06-12 DIAGNOSIS — E119 Type 2 diabetes mellitus without complications: Secondary | ICD-10-CM | POA: Diagnosis not present

## 2015-06-12 DIAGNOSIS — F17200 Nicotine dependence, unspecified, uncomplicated: Secondary | ICD-10-CM | POA: Diagnosis not present

## 2015-06-12 DIAGNOSIS — M542 Cervicalgia: Secondary | ICD-10-CM | POA: Diagnosis not present

## 2015-06-12 DIAGNOSIS — J449 Chronic obstructive pulmonary disease, unspecified: Secondary | ICD-10-CM | POA: Diagnosis not present

## 2015-06-13 ENCOUNTER — Telehealth: Payer: Self-pay | Admitting: Family Medicine

## 2015-06-13 NOTE — Telephone Encounter (Signed)
May move appt to the 8th

## 2015-06-13 NOTE — Telephone Encounter (Signed)
appt moved to the 8th

## 2015-06-13 NOTE — Telephone Encounter (Signed)
Pt had an appt with [place] on the 9th of Dec, but he is able to get into [facility] that Same day to get his knee looked at further. Wants to know if he can get in to see You before the 12th when his med renewal is or get a refill on his meds till he can  Get in at a later date?   Please advise

## 2015-06-15 ENCOUNTER — Other Ambulatory Visit: Payer: Self-pay | Admitting: *Deleted

## 2015-06-15 MED ORDER — FUROSEMIDE 20 MG PO TABS: 20.0000 mg | ORAL_TABLET | Freq: Every day | ORAL | Status: DC

## 2015-06-21 ENCOUNTER — Ambulatory Visit: Payer: Medicare Other | Admitting: Family Medicine

## 2015-06-22 ENCOUNTER — Ambulatory Visit (INDEPENDENT_AMBULATORY_CARE_PROVIDER_SITE_OTHER): Payer: Medicare Other | Admitting: Family Medicine

## 2015-06-22 ENCOUNTER — Encounter: Payer: Self-pay | Admitting: Family Medicine

## 2015-06-22 VITALS — BP 138/80 | Ht 67.0 in | Wt 281.4 lb

## 2015-06-22 DIAGNOSIS — Z79899 Other long term (current) drug therapy: Secondary | ICD-10-CM | POA: Diagnosis not present

## 2015-06-22 DIAGNOSIS — G894 Chronic pain syndrome: Secondary | ICD-10-CM | POA: Diagnosis not present

## 2015-06-22 DIAGNOSIS — I10 Essential (primary) hypertension: Secondary | ICD-10-CM

## 2015-06-22 DIAGNOSIS — Z13220 Encounter for screening for lipoid disorders: Secondary | ICD-10-CM | POA: Diagnosis not present

## 2015-06-22 DIAGNOSIS — E119 Type 2 diabetes mellitus without complications: Secondary | ICD-10-CM | POA: Diagnosis not present

## 2015-06-22 LAB — POCT GLYCOSYLATED HEMOGLOBIN (HGB A1C): Hemoglobin A1C: 7.1

## 2015-06-22 MED ORDER — MORPHINE SULFATE ER 30 MG PO TBCR: 30.0000 mg | EXTENDED_RELEASE_TABLET | Freq: Two times a day (BID) | ORAL | Status: DC

## 2015-06-22 MED ORDER — HYDROCODONE-ACETAMINOPHEN 10-325 MG PO TABS: 1.0000 | ORAL_TABLET | ORAL | Status: DC | PRN

## 2015-06-22 NOTE — Progress Notes (Signed)
   Subjective:    Patient ID: Shaun Harris, male    DOB: 1966-03-10, 49 y.o.   MRN: [id number]  Diabetes He presents for his follow-up diabetic visit. He has type 2 diabetes mellitus. No MedicAlert identification noted. He has not had a previous visit with a dietitian. He does not see a podiatrist.Eye exam is current (September 2016).   states overall sugars running good no major low spells. Compliant with meds mostly compliant with diet not exercise much due to knee pain  Results for orders placed or performed in visit on 06/22/15  POCT HgB A1C  Result Value Ref Range   Hemoglobin A1C 7.1     Patient would like to discuss consultation for Total knee replacement surgery at [facility]. Needs total repl, dr Luna Glasgow unable to do Has been told with obesity absolutely must lose weight.  Compliant blood pressure medicine. Does not miss doses cut down salt intake.  Compliant with chronic pain medications discussed. Has chronic neuropathic headaches. Also progressive knee pain and difficulties with it.      Review of Systems Positive chronic headache positive chronic knee pain no chest pain minimal back pain no shortness of breath still wheezing unfortunately smoking    Objective:   Physical Exam Alert vital stable blood pressure good on repeat HEENT normal obesity present lungs clear heart rare rhythm knee severe crepitations ankles trace edema feet sensation intact       Assessment & Plan:  Impression 1 chronic pain multiplex 12 #2 hypertension good control meds reviewed #3 asthma stable encouraged to stop smoking #4 obesity in need of intervention discussed #5 type 2 diabetes control fair diet exercise discussed compliance and meds reviewed and discussed plan diet exercise discussed initiate phentermine if the specialists require intervention chronic discuss they can call us. Other meds refilled recheck as scheduled pain medicine given WSL

## 2015-06-23 ENCOUNTER — Ambulatory Visit: Payer: Medicare Other | Admitting: Family Medicine

## 2015-06-23 LAB — LIPID PANEL
Chol/HDL Ratio: 3.1 ratio units (ref 0.0–5.0)
Cholesterol, Total: 135 mg/dL (ref 100–199)
HDL: 43 mg/dL (ref >39)
LDL Calculated: 71 mg/dL (ref 0–99)
Triglycerides: 107 mg/dL (ref 0–149)
VLDL Cholesterol Cal: 21 mg/dL (ref 5–40)

## 2015-06-23 LAB — HEPATIC FUNCTION PANEL
ALT: 14 IU/L (ref 0–44)
AST: 16 IU/L (ref 0–40)
Albumin: 4.9 g/dL (ref 3.5–5.5)
Alkaline Phosphatase: 89 IU/L (ref 39–117)
Bilirubin Total: 0.4 mg/dL (ref 0.0–1.2)
Bilirubin, Direct: 0.1 mg/dL (ref 0.00–0.40)
Total Protein: 7 g/dL (ref 6.0–8.5)

## 2015-06-27 ENCOUNTER — Telehealth: Payer: Self-pay

## 2015-06-27 MED ORDER — PHENTERMINE HCL 37.5 MG PO CAPS: 37.5000 mg | ORAL_CAPSULE | ORAL | Status: DC

## 2015-06-27 NOTE — Addendum Note (Signed)
Addended by: Ofilia Neas R on: 06/27/2015 11:26 AM   Modules accepted: Orders

## 2015-06-27 NOTE — Telephone Encounter (Signed)
Phen 37.5 numb 30 plus 2 ref

## 2015-06-27 NOTE — Telephone Encounter (Signed)
Patient's wife called stating that patient would like to start on the diet pills that was discussed at last visit due to the surgeon telling patient that he needs to lose at least 27 lbs before surgery. Please advise?

## 2015-06-27 NOTE — Telephone Encounter (Signed)
Script printed awaiting signature.  

## 2015-06-28 NOTE — Telephone Encounter (Signed)
Rx faxed to pharmacy as requested.

## 2015-06-29 ENCOUNTER — Encounter: Payer: Self-pay | Admitting: Family Medicine

## 2015-07-15 ENCOUNTER — Other Ambulatory Visit: Payer: Self-pay | Admitting: Family Medicine

## 2015-08-14 ENCOUNTER — Other Ambulatory Visit: Payer: Self-pay | Admitting: Family Medicine

## 2015-08-14 DIAGNOSIS — Z6841 Body Mass Index (BMI) 40.0 and over, adult: Secondary | ICD-10-CM | POA: Diagnosis not present

## 2015-08-14 DIAGNOSIS — R0602 Shortness of breath: Secondary | ICD-10-CM | POA: Diagnosis not present

## 2015-08-14 DIAGNOSIS — R0609 Other forms of dyspnea: Secondary | ICD-10-CM | POA: Diagnosis not present

## 2015-08-14 DIAGNOSIS — J449 Chronic obstructive pulmonary disease, unspecified: Secondary | ICD-10-CM | POA: Diagnosis not present

## 2015-08-30 DIAGNOSIS — M170 Bilateral primary osteoarthritis of knee: Secondary | ICD-10-CM | POA: Diagnosis not present

## 2015-09-01 ENCOUNTER — Encounter (HOSPITAL_COMMUNITY): Payer: Self-pay

## 2015-09-07 ENCOUNTER — Encounter: Payer: Self-pay | Admitting: Orthopaedic Surgery

## 2015-09-07 ENCOUNTER — Telehealth: Payer: Self-pay | Admitting: *Deleted

## 2015-09-07 ENCOUNTER — Ambulatory Visit (INDEPENDENT_AMBULATORY_CARE_PROVIDER_SITE_OTHER): Payer: Medicare Other | Admitting: Orthopaedic Surgery

## 2015-09-07 VITALS — BP 136/88 | HR 110 | Temp 98.1°F | Resp 16 | Ht 67.0 in | Wt 278.0 lb

## 2015-09-07 DIAGNOSIS — M25562 Pain in left knee: Secondary | ICD-10-CM

## 2015-09-07 MED ORDER — OXYCODONE HCL 10 MG PO TABS
ORAL_TABLET | ORAL | Status: DC
Start: 2015-09-07 — End: 2015-09-18

## 2015-09-07 NOTE — Telephone Encounter (Signed)
Dr. Brooke Bonito office called. Pt is there now and in a lot of pain. He gets hydrocodone from Dr. Richardson Landry. Dr. Luna Glasgow wants to prescribed percocet to the pt. Ok per dr Richardson Landry as long as pt does not take together and that dr Richardson Landry know how much percocet he will get. Dr Luna Glasgow is prescribing 4 tabs per day until march 6th.
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
## 2015-09-07 NOTE — Patient Instructions (Addendum)
REFER TO DR. BOLOGNESI AT [facility] FOR SECOND OPINION RECOMMENDED BY Hulan Fray, MD  YOU NEED TO LOSE 10 POUNDS

## 2015-09-12 ENCOUNTER — Other Ambulatory Visit: Payer: Self-pay | Admitting: Family Medicine

## 2015-09-13 ENCOUNTER — Telehealth: Payer: Self-pay | Admitting: *Deleted

## 2015-09-13 NOTE — Telephone Encounter (Signed)
Patient has an appointment with Dr. Landis Gandy 12/27/15. Spoke with patient's wife, Amy, and they are aware.

## 2015-09-18 ENCOUNTER — Encounter: Payer: Self-pay | Admitting: Family Medicine

## 2015-09-18 ENCOUNTER — Ambulatory Visit (INDEPENDENT_AMBULATORY_CARE_PROVIDER_SITE_OTHER): Payer: Medicare Other | Admitting: Family Medicine

## 2015-09-18 ENCOUNTER — Ambulatory Visit (HOSPITAL_COMMUNITY)
Admission: RE | Admit: 2015-09-18 | Discharge: 2015-09-18 | Disposition: A | Discharge status: Home / Self Care | Payer: Medicare Other | Source: Ambulatory Visit | Attending: Family Medicine | Admitting: Family Medicine

## 2015-09-18 VITALS — Ht 67.0 in

## 2015-09-18 DIAGNOSIS — M5136 Other intervertebral disc degeneration, lumbar region: Secondary | ICD-10-CM | POA: Diagnosis not present

## 2015-09-18 DIAGNOSIS — D649 Anemia, unspecified: Secondary | ICD-10-CM

## 2015-09-18 DIAGNOSIS — M549 Dorsalgia, unspecified: Secondary | ICD-10-CM | POA: Diagnosis not present

## 2015-09-18 DIAGNOSIS — E119 Type 2 diabetes mellitus without complications: Secondary | ICD-10-CM | POA: Diagnosis not present

## 2015-09-18 DIAGNOSIS — G894 Chronic pain syndrome: Secondary | ICD-10-CM | POA: Diagnosis not present

## 2015-09-18 LAB — POCT GLYCOSYLATED HEMOGLOBIN (HGB A1C): Hemoglobin A1C: 6.8

## 2015-09-18 MED ORDER — MORPHINE SULFATE ER 30 MG PO TBCR: 30.0000 mg | EXTENDED_RELEASE_TABLET | Freq: Two times a day (BID) | ORAL | Status: DC

## 2015-09-18 MED ORDER — OXYCODONE HCL 10 MG PO TABS: ORAL_TABLET | ORAL | Status: DC

## 2015-09-18 NOTE — Progress Notes (Signed)
   Subjective:    Patient ID: Shaun Harris, male    DOB: 1966-04-02, 50 y.o.   MRN: [id number]  Diabetes He presents for his follow-up diabetic visit. He has type 2 diabetes mellitus. Risk factors for coronary artery disease include diabetes mellitus, dyslipidemia, hypertension, obesity, sedentary lifestyle and tobacco exposure. Current diabetic treatment includes insulin injections. He is compliant with treatment all of the time. His weight is stable. He is following a diabetic diet.   Results for orders placed or performed in visit on 09/18/15  POCT glycosylated hemoglobin (Hb A1C)  Result Value Ref Range   Hemoglobin A1C 6.8    250 to 400 over the past nonth and a half, states sugars have been elevated not really sure why next  Patient reports back pain is worsening. Primarily lumbar pain. Worse with any type of motion. Some radiation into the hips. Not a true sciatica-like pattern.  Discuss pain management, severe leg and back pain- painful knot on chest for over a month  Knot ant left chest, appeared for unknown reasons painful and tender at times with palpation  Originally wsaw a bruise  Left knee arthrosocopic surg lat[place] fr dr Luna Glasgow, pt told to drp weight by [facility]  Saw a pul;monary doctor who raised concern about possible potential for clots with surg due to medical problems   Review of Systems No other types of chest pain no abdominal pain no change in bowel habits no blood in stool no fatigue ROS otherwise negative    Objective:   Physical Exam alert vital stable. HEENT normal. Lungs clear. Heart regular rate and rhythm. Ankles without edema Left anterior chest palpable nodule at site of old hematoma     Assessment & Plan:  Impression 1 type 2 diabetes suboptimum numbers but A1c still good maintain same medication #2 resolving hematoma with nodule no need for further workup at this time #3 worsening back pain need to check an x-ray #4 chronic pain ongoing including  headaches with need for medicine plan medicine refilled. Appropriate blood work. Low back x-ray diet exercise discussed recheck in several months WSL

## 2015-09-19 LAB — CBC WITH DIFFERENTIAL/PLATELET
Basophils Absolute: 0 10*3/uL (ref 0.0–0.2)
Basos: 0 %
EOS (ABSOLUTE): 0.1 10*3/uL (ref 0.0–0.4)
Eos: 1 %
Hematocrit: 46.2 % (ref 37.5–51.0)
Hemoglobin: 15.1 g/dL (ref 12.6–17.7)
Immature Grans (Abs): 0.1 10*3/uL (ref 0.0–0.1)
Immature Granulocytes: 1 %
Lymphocytes Absolute: 2.8 10*3/uL (ref 0.7–3.1)
Lymphs: 26 %
MCH: 30.8 pg (ref 26.6–33.0)
MCHC: 32.7 g/dL (ref 31.5–35.7)
MCV: 94 fL (ref 79–97)
Monocytes Absolute: 0.8 10*3/uL (ref 0.1–0.9)
Monocytes: 7 %
Neutrophils Absolute: 7.1 10*3/uL — ABNORMAL HIGH (ref 1.4–7.0)
Neutrophils: 65 %
Platelets: 240 10*3/uL (ref 150–379)
RBC: 4.91 x10E6/uL (ref 4.14–5.80)
RDW: 13.2 % (ref 12.3–15.4)
WBC: 10.9 10*3/uL — ABNORMAL HIGH (ref 3.4–10.8)

## 2015-09-19 LAB — HEMOGLOBIN A1C
Est. average glucose Bld gHb Est-mCnc: 171 mg/dL
Hgb A1c MFr Bld: 7.6 % — ABNORMAL HIGH (ref 4.8–5.6)

## 2015-09-20 ENCOUNTER — Ambulatory Visit: Payer: Medicare Other | Admitting: Family Medicine

## 2015-10-12 ENCOUNTER — Other Ambulatory Visit: Payer: Self-pay

## 2015-10-12 ENCOUNTER — Other Ambulatory Visit: Payer: Self-pay | Admitting: Family Medicine

## 2015-10-12 ENCOUNTER — Telehealth: Payer: Self-pay | Admitting: Family Medicine

## 2015-10-12 NOTE — Telephone Encounter (Signed)
Request received from pharmacy. Will be handled when the doctor is able to respond. Amy knows.

## 2015-10-12 NOTE — Telephone Encounter (Signed)
Pt had meds called in today and they say they need them today if  Possible, they have a family funeral out of town and will be leaving  In the morning   Thanks (should already be in the in-basket from the pharmacy)

## 2015-10-13 MED ORDER — ALPRAZOLAM 1 MG PO TABS: 1.0000 mg | ORAL_TABLET | Freq: Two times a day (BID) | ORAL | Status: DC | PRN

## 2015-10-13 NOTE — Telephone Encounter (Signed)
Ok six mo 

## 2015-10-20 ENCOUNTER — Telehealth: Payer: Self-pay | Admitting: Family Medicine

## 2015-10-20 NOTE — Telephone Encounter (Signed)
Sorry, on full dose narcotics the experts strongly recommed actually no xanaxes per day but will cst since pt has been on along for a long time. But cannot increase

## 2015-10-20 NOTE — Telephone Encounter (Signed)
[facility] 10/20/15

## 2015-10-20 NOTE — Telephone Encounter (Signed)
Patient is going through a lot right now and Amy would like to know if Kilian can take 3 alprazolam's a day instead of taking 2 daily.  Please advise.

## 2015-10-23 NOTE — Telephone Encounter (Signed)
Patient advised Sorry, on full dose narcotics the experts strongly recommed actually no xanaxes per day but will cst since pt has been on along for a long time. But cannot increase. Patient verbalized understanding.

## 2015-11-06 ENCOUNTER — Telehealth: Payer: Self-pay | Admitting: Family Medicine

## 2015-11-06 ENCOUNTER — Other Ambulatory Visit: Payer: Self-pay | Admitting: Family Medicine

## 2015-11-06 NOTE — Telephone Encounter (Signed)
LANTUS 100 UNIT/ML injection   Pt needs this filled today if possible, Pharmacy supposed to send request But they were afraid it wouldn't get done in time. Pt doesn't have enough for  tonight's dose  [employer]

## 2015-11-06 NOTE — Telephone Encounter (Signed)
Amy notified that med has been refilled. We received request from pharmacy.

## 2015-11-13 ENCOUNTER — Other Ambulatory Visit: Payer: Self-pay | Admitting: Family Medicine

## 2015-11-13 NOTE — Telephone Encounter (Signed)
Let's try this since we see him every 89 days for pain meds, lets write one years worth for all chronic noncontroled substance meds

## 2015-11-30 ENCOUNTER — Telehealth: Payer: Self-pay | Admitting: Family Medicine

## 2015-11-30 MED ORDER — INSULIN ASPART 100 UNIT/ML ~~LOC~~ SOLN: SUBCUTANEOUS | Status: DC

## 2015-11-30 NOTE — Telephone Encounter (Signed)
Medication sent electronically to requested pharmacy. Spoke with patient's wife and informed her. Patient's wife verbalized understanding.

## 2015-11-30 NOTE — Telephone Encounter (Signed)
Amy dropped his insulin this morning and busted it, they are  Not at home. Please send a script to [facility] at  [place] fax #  [email or web address]

## 2015-11-30 NOTE — Telephone Encounter (Signed)
Shaun Harris is waiting at [facility] for this because he did not  Get his dose this mornging if we can send this as soon as  Possible, call Shaun Harris at [phone] when sent or questions

## 2015-12-01 ENCOUNTER — Telehealth: Payer: Self-pay | Admitting: Family Medicine

## 2015-12-01 MED ORDER — INSULIN GLARGINE 100 UNIT/ML ~~LOC~~ SOLN: SUBCUTANEOUS | Status: DC

## 2015-12-01 NOTE — Telephone Encounter (Signed)
Requested medication sent to pharmacy. Patient notified.

## 2015-12-01 NOTE — Telephone Encounter (Signed)
LANTUS 100 UNIT/ML injection  Pt was supposed to get this med not Novalog that was sent yesterday We need to send in the correct med please so the pt can get his insulin  Today   See original note from yesterday for pharmacy to send it to please   [address] [place] [id number]

## 2015-12-14 ENCOUNTER — Other Ambulatory Visit: Payer: Self-pay | Admitting: Family Medicine

## 2015-12-19 ENCOUNTER — Ambulatory Visit (INDEPENDENT_AMBULATORY_CARE_PROVIDER_SITE_OTHER): Payer: Medicare Other | Admitting: Family Medicine

## 2015-12-19 ENCOUNTER — Encounter: Payer: Self-pay | Admitting: Family Medicine

## 2015-12-19 VITALS — BP 132/78 | Ht 67.0 in | Wt 291.0 lb

## 2015-12-19 DIAGNOSIS — E785 Hyperlipidemia, unspecified: Secondary | ICD-10-CM | POA: Diagnosis not present

## 2015-12-19 DIAGNOSIS — E119 Type 2 diabetes mellitus without complications: Secondary | ICD-10-CM | POA: Diagnosis not present

## 2015-12-19 DIAGNOSIS — Z79891 Long term (current) use of opiate analgesic: Secondary | ICD-10-CM

## 2015-12-19 DIAGNOSIS — Z79899 Other long term (current) drug therapy: Secondary | ICD-10-CM

## 2015-12-19 LAB — POCT GLYCOSYLATED HEMOGLOBIN (HGB A1C): Hemoglobin A1C: 7.1

## 2015-12-19 MED ORDER — MORPHINE SULFATE ER 30 MG PO TBCR: 30.0000 mg | EXTENDED_RELEASE_TABLET | Freq: Two times a day (BID) | ORAL | Status: DC

## 2015-12-19 MED ORDER — OXYCODONE HCL 10 MG PO TABS: ORAL_TABLET | ORAL | Status: DC

## 2015-12-19 MED ORDER — INSULIN GLARGINE 100 UNIT/ML ~~LOC~~ SOLN: SUBCUTANEOUS | Status: DC

## 2015-12-19 NOTE — Progress Notes (Signed)
   Subjective:  Patient arrives office with numerous concerns  Patient ID: Shaun Harris, male    DOB: [birth year], 50 y.o.   MRN: [id number]  HPI This patient was seen today for chronic pain  The medication list was reviewed and updated.   -Compliance with medication: takes as prescribed. Takes for knee pain, neck pain, back pain.  - Number patient states they take daily: four a day  -when was the last dose patient took? This am  The patient was advised the importance of maintaining medication and not using illegal substances with these.  Refills needed: yes  The patient was educated that we can provide 3 monthly scripts for their medication, it is their responsibility to follow the instructions.  Side effects or complications from medications: none  Patient is aware that pain medications are meant to minimize the severity of the pain to allow their pain levels to improve to allow for better function. They are aware of that pain medications cannot totally remove their pain.  Due for UDT ( at least once per year) :   Spells of wheezing. Pretty rare this time as long as she sticks with Advair. No obvious side effects. Uses inhaler less than once every couple weeks  Compliant with blood pressure medicine. No obvious side effects. Watching salt intake  Ongoing severe headaches and back pain with need for pain medicine   Pt states blood sugar has been high. One day in the 500's.    Results for orders placed or performed in visit on 12/19/15  POCT glycosylated hemoglobin (Hb A1C)  Result Value Ref Range   Hemoglobin A1C 7.1      Review of Systems No chest pain no shortness breath no abdominal pain no change bowel habits no rash ROS otherwise negative    Objective:   Physical Exam  Alert obesity present blood pressure good on repeat HEENT normal lungs no true wheezes heart rare rhythm ankles without edema      Assessment & Plan:

## 2015-12-20 LAB — HEPATIC FUNCTION PANEL
ALT: 21 IU/L (ref 0–44)
AST: 25 IU/L (ref 0–40)
Albumin: 4.8 g/dL (ref 3.5–5.5)
Alkaline Phosphatase: 107 IU/L (ref 39–117)
Bilirubin Total: 0.3 mg/dL (ref 0.0–1.2)
Bilirubin, Direct: 0.08 mg/dL (ref 0.00–0.40)
Total Protein: 7.1 g/dL (ref 6.0–8.5)

## 2015-12-20 LAB — LIPID PANEL
Chol/HDL Ratio: 3.2 ratio units (ref 0.0–5.0)
Cholesterol, Total: 142 mg/dL (ref 100–199)
HDL: 45 mg/dL (ref >39)
LDL Calculated: 77 mg/dL (ref 0–99)
Triglycerides: 102 mg/dL (ref 0–149)
VLDL Cholesterol Cal: 20 mg/dL (ref 5–40)

## 2015-12-20 LAB — BASIC METABOLIC PANEL
BUN/Creatinine Ratio: 21 — ABNORMAL HIGH (ref 9–20)
BUN: 17 mg/dL (ref 6–24)
CO2: 24 mmol/L (ref 18–29)
Calcium: 9.4 mg/dL (ref 8.7–10.2)
Chloride: 101 mmol/L (ref 96–106)
Creatinine, Ser: 0.8 mg/dL (ref 0.76–1.27)
GFR calc Af Amer: 121 mL/min/{1.73_m2} (ref >59)
GFR calc non Af Amer: 105 mL/min/{1.73_m2} (ref >59)
Glucose: 151 mg/dL — ABNORMAL HIGH (ref 65–99)
Potassium: 4.2 mmol/L (ref 3.5–5.2)
Sodium: 143 mmol/L (ref 134–144)

## 2015-12-20 LAB — MICROALBUMIN / CREATININE URINE RATIO
Creatinine, Urine: 80.1 mg/dL
MICROALB/CREAT RATIO: 11.2 mg/g creat (ref 0.0–30.0)
Microalbumin, Urine: 9 ug/mL

## 2015-12-26 LAB — TOXASSURE SELECT 13 (MW), URINE: PDF: 0

## 2015-12-27 DIAGNOSIS — M170 Bilateral primary osteoarthritis of knee: Secondary | ICD-10-CM | POA: Diagnosis not present

## 2016-01-10 ENCOUNTER — Encounter: Payer: Self-pay | Admitting: Family Medicine

## 2016-01-23 ENCOUNTER — Telehealth: Payer: Self-pay | Admitting: Family Medicine

## 2016-01-23 ENCOUNTER — Encounter: Payer: Self-pay | Admitting: Family Medicine

## 2016-01-23 ENCOUNTER — Ambulatory Visit (INDEPENDENT_AMBULATORY_CARE_PROVIDER_SITE_OTHER): Payer: Medicare Other | Admitting: Family Medicine

## 2016-01-23 VITALS — BP 140/86 | Ht 67.0 in | Wt 293.5 lb

## 2016-01-23 DIAGNOSIS — M792 Neuralgia and neuritis, unspecified: Secondary | ICD-10-CM | POA: Diagnosis not present

## 2016-01-23 NOTE — Progress Notes (Signed)
   Subjective:    Patient ID: Shaun Harris, male    DOB: May 17, 1966, 50 y.o.   MRN: [id number] Patient arrives office in severe pain Shoulder Pain  The pain is present in the right shoulder. This is a recurrent problem. The current episode started more than 1 year ago. The quality of the pain is described as aching, burning, pounding and sharp.  Neck Pain  This is a chronic problem. The current episode started more than 1 year ago. The pain is associated with nothing. The pain is present in the right side. The quality of the pain is described as aching, burning, cramping, shooting and stabbing.  Arm Pain  The incident occurred more than 1 week ago. The pain is present in the upper right arm. The quality of the pain is described as stabbing, shooting, cramping and aching.   Patient states no other concerns this visit.  Mri done over a yr ago, ongoing hurting and [pain   Right lat neck pain shooting into shoulder and into the hand   recals no injury   Pain in severe in nature    Review of Systems  Musculoskeletal: Positive for neck pain.  No headache, no major weight loss or weight gain, no chest pain no back pain abdominal pain no change in bowel habits complete ROS otherwise negative      Objective:   Physical Exam  Alert considerable distress right lateral neck and shoulder pain and tenderness. Also pain radiating down into right arm and hand. Shooting in nature. Deep tooth achy feeling. Some impact by movement lungs clear. Heart regular in rhythm.      Assessment & Plan:  Impression potential flare of cervical neuropathy. Patient has history of disc disease and surgery. Also has known bulging other disks. Pain is very severe. Already on high-dose chronic pain medication for chronic headaches. Plan time for pain specialist referral for injections and I think really consideration towards taking over pain medicine now that we have gone to enormously high doses. May increase  oxycodone short-term up to 8 per day. We will dialed his back very quickly. MRI cervical spine rationale discussed. WSL

## 2016-01-25 ENCOUNTER — Telehealth: Payer: Self-pay | Admitting: *Deleted

## 2016-01-25 NOTE — Telephone Encounter (Signed)
Discussed with pt. Pt told at office visit 2 days ago to take up to 8 oxycodone for the first couple of days. Pt should now not exceed 6 per day. Per dr Richardson Landry.  Referral is being worked on. Pt verbalized understanding and will not take more than 6 tab per day. One every four hours.

## 2016-01-25 NOTE — Telephone Encounter (Signed)
error 

## 2016-02-02 ENCOUNTER — Ambulatory Visit (HOSPITAL_COMMUNITY): Payer: No Typology Code available for payment source

## 2016-02-05 ENCOUNTER — Telehealth: Payer: Self-pay | Admitting: Family Medicine

## 2016-02-05 ENCOUNTER — Other Ambulatory Visit: Payer: Self-pay | Admitting: Family Medicine

## 2016-02-05 ENCOUNTER — Ambulatory Visit (HOSPITAL_COMMUNITY)
Admission: RE | Admit: 2016-02-05 | Discharge: 2016-02-05 | Disposition: A | Discharge status: Home / Self Care | Payer: Medicare Other | Source: Ambulatory Visit | Attending: Family Medicine | Admitting: Family Medicine

## 2016-02-05 DIAGNOSIS — M2578 Osteophyte, vertebrae: Secondary | ICD-10-CM | POA: Diagnosis not present

## 2016-02-05 DIAGNOSIS — M47812 Spondylosis without myelopathy or radiculopathy, cervical region: Secondary | ICD-10-CM | POA: Insufficient documentation

## 2016-02-05 DIAGNOSIS — M792 Neuralgia and neuritis, unspecified: Secondary | ICD-10-CM | POA: Insufficient documentation

## 2016-02-05 DIAGNOSIS — M4322 Fusion of spine, cervical region: Secondary | ICD-10-CM | POA: Diagnosis not present

## 2016-02-05 MED ORDER — OXYCODONE HCL 10 MG PO TABS: ORAL_TABLET | ORAL | 0 refills | Status: DC

## 2016-02-05 NOTE — Telephone Encounter (Signed)
Write thirty days worth for six per d

## 2016-02-05 NOTE — Telephone Encounter (Signed)
Prescription printed for 180 tablets per Dr.Steve (30 days worth to take up to 6 tablets a day). Patient notified and verbalized understanding.

## 2016-02-05 NOTE — Telephone Encounter (Signed)
Spoke with patient's wife and patient's wife stated that patient is currently taking 6 a day and only has enough to last until Wednesday. Please advise?

## 2016-02-05 NOTE — Telephone Encounter (Signed)
Patient will need a refill on his Oxycodone HCl 10 MG TABS on Wednesday of this week.

## 2016-02-05 NOTE — Telephone Encounter (Signed)
Ask pt how often is he taking?? (I had told one of the nurses to call and make sure he knocked down to six per d max and hopefully less, needs ref likely but neex to know that

## 2016-02-05 NOTE — Telephone Encounter (Signed)
[facility] 02/05/16

## 2016-02-06 ENCOUNTER — Encounter: Payer: Self-pay | Admitting: Family Medicine

## 2016-02-07 DIAGNOSIS — R0609 Other forms of dyspnea: Secondary | ICD-10-CM | POA: Diagnosis not present

## 2016-02-07 DIAGNOSIS — J449 Chronic obstructive pulmonary disease, unspecified: Secondary | ICD-10-CM | POA: Diagnosis not present

## 2016-02-09 DIAGNOSIS — M5030 Other cervical disc degeneration, unspecified cervical region: Secondary | ICD-10-CM | POA: Diagnosis not present

## 2016-02-09 DIAGNOSIS — M542 Cervicalgia: Secondary | ICD-10-CM | POA: Diagnosis not present

## 2016-02-09 DIAGNOSIS — M4802 Spinal stenosis, cervical region: Secondary | ICD-10-CM | POA: Diagnosis not present

## 2016-02-09 DIAGNOSIS — M4722 Other spondylosis with radiculopathy, cervical region: Secondary | ICD-10-CM | POA: Diagnosis not present

## 2016-02-10 ENCOUNTER — Other Ambulatory Visit: Payer: Self-pay | Admitting: Family Medicine

## 2016-02-13 ENCOUNTER — Other Ambulatory Visit: Payer: Self-pay | Admitting: Family Medicine

## 2016-02-15 DIAGNOSIS — M170 Bilateral primary osteoarthritis of knee: Secondary | ICD-10-CM | POA: Diagnosis not present

## 2016-02-21 DIAGNOSIS — J449 Chronic obstructive pulmonary disease, unspecified: Secondary | ICD-10-CM | POA: Diagnosis not present

## 2016-03-05 ENCOUNTER — Telehealth: Payer: Self-pay | Admitting: Family Medicine

## 2016-03-05 MED ORDER — OXYCODONE HCL 10 MG PO TABS: ORAL_TABLET | ORAL | 0 refills | Status: DC

## 2016-03-05 NOTE — Telephone Encounter (Signed)
Spoke with patient's wife and informed her per Dr.Steve Luking- one time prescription needs face to face visit before any further prescribed. Patient's wife verbalized understanding.

## 2016-03-05 NOTE — Telephone Encounter (Signed)
Write one time rx needs face to face visit before any further rx'ed

## 2016-03-05 NOTE — Telephone Encounter (Signed)
Patient states he will need a refill on his Oxycodone HCl 10 MG TABS by Thursday of this week.

## 2016-03-05 NOTE — Telephone Encounter (Signed)
Left message return call (prescription awaiting signature)

## 2016-03-15 ENCOUNTER — Other Ambulatory Visit: Payer: Self-pay | Admitting: Family Medicine

## 2016-03-15 ENCOUNTER — Other Ambulatory Visit: Payer: Self-pay

## 2016-03-15 MED ORDER — ALPRAZOLAM 1 MG PO TABS: 1.0000 mg | ORAL_TABLET | Freq: Two times a day (BID) | ORAL | 5 refills | Status: DC | PRN

## 2016-03-15 NOTE — Telephone Encounter (Signed)
Six mo worth 

## 2016-03-21 ENCOUNTER — Ambulatory Visit (INDEPENDENT_AMBULATORY_CARE_PROVIDER_SITE_OTHER): Payer: Medicare Other | Admitting: Family Medicine

## 2016-03-21 ENCOUNTER — Encounter: Payer: Self-pay | Admitting: Family Medicine

## 2016-03-21 VITALS — BP 120/74 | Ht 67.0 in | Wt 296.0 lb

## 2016-03-21 DIAGNOSIS — I10 Essential (primary) hypertension: Secondary | ICD-10-CM | POA: Diagnosis not present

## 2016-03-21 DIAGNOSIS — Z23 Encounter for immunization: Secondary | ICD-10-CM | POA: Diagnosis not present

## 2016-03-21 DIAGNOSIS — E119 Type 2 diabetes mellitus without complications: Secondary | ICD-10-CM | POA: Diagnosis not present

## 2016-03-21 DIAGNOSIS — M5412 Radiculopathy, cervical region: Secondary | ICD-10-CM | POA: Diagnosis not present

## 2016-03-21 DIAGNOSIS — M792 Neuralgia and neuritis, unspecified: Secondary | ICD-10-CM

## 2016-03-21 DIAGNOSIS — E785 Hyperlipidemia, unspecified: Secondary | ICD-10-CM

## 2016-03-21 DIAGNOSIS — M4722 Other spondylosis with radiculopathy, cervical region: Secondary | ICD-10-CM | POA: Diagnosis not present

## 2016-03-21 DIAGNOSIS — Z6841 Body Mass Index (BMI) 40.0 and over, adult: Secondary | ICD-10-CM | POA: Diagnosis not present

## 2016-03-21 DIAGNOSIS — M4802 Spinal stenosis, cervical region: Secondary | ICD-10-CM | POA: Diagnosis not present

## 2016-03-21 LAB — POCT GLYCOSYLATED HEMOGLOBIN (HGB A1C): Hemoglobin A1C: 6.9

## 2016-03-21 MED ORDER — MORPHINE SULFATE ER 30 MG PO TBCR: 30.0000 mg | EXTENDED_RELEASE_TABLET | Freq: Two times a day (BID) | ORAL | 0 refills | Status: DC

## 2016-03-21 MED ORDER — OXYCODONE HCL 10 MG PO TABS: ORAL_TABLET | ORAL | 0 refills | Status: DC

## 2016-03-21 NOTE — Progress Notes (Signed)
   Subjective:    Patient ID: Shaun Harris, male    DOB: Oct 09, 1965, 50 y.o.   MRN: [id number]  Diabetes  He presents for his follow-up diabetic visit. He has type 2 diabetes mellitus. There are no hypoglycemic associated symptoms. There are no diabetic associated symptoms. There are no hypoglycemic complications. There are no diabetic complications. There are no known risk factors for coronary artery disease. Current diabetic treatment includes insulin injections. He is compliant with treatment all of the time.   Results for orders placed or performed in visit on 03/21/16  POCT glycosylated hemoglobin (Hb A1C)  Result Value Ref Range   Hemoglobin A1C 6.9    Patient claims compliance with diabetes medication. No obvious side effects. Reports no substantial low sugar spells. Most numbers are generally in good range when checked fasting. Generally does not miss a dose of medication. Watching diabetic diet closely  Blood pressure medicine and blood pressure levels reviewed today with patient. Compliant with blood pressure medicine. States does not miss a dose. No obvious side effects. Blood pressure generally good when checked elsewhere. Watching salt intake.   Patient continues to take lipid medication regularly. No obvious side effects from it. Generally does not miss a dose. Prior blood work results are reviewed with patient. Patient continues to work on fat intake in diet   This patient was seen today for chronic pain  The medication list was reviewed and updated.   -Compliance with medication: yes  - Number patient states they take daily: 6 daily   -when was the last dose patient took: today   The patient was advised the importance of maintaining medication and not using illegal substances with these.  Refills needed: yes  The patient was educated that we can provide 3 monthly scripts for their medication, it is their responsibility to follow the instructions.  Side effects or  complications from medications: none  Patient is aware that pain medications are meant to minimize the severity of the pain to allow their pain levels to improve to allow for better function. They are aware of that pain medications cannot totally remove their pain.  Due for UDT ( at least once per year) : utd  Patient has no new concerns at this time.   cerv spine neuropathy, was seen by pain spec, given gabapentin and it rn up sugar  Results for orders placed or performed in visit on 03/21/16  POCT glycosylated hemoglobin (Hb A1C)  Result Value Ref Range   Hemoglobin A1C 6.9        Review of Systems No headache, no major weight loss or weight gain, no chest pain no back pain abdominal pain no change in bowel habits complete ROS otherwise negative     Objective:   Physical Exam Alert vital stable obesity present lungs no wheezes currently heart rare rhythm ankles without edema feet sensation intact.       Assessment & Plan:  Impression 1 chronic pain with ongoing need for meds. Ventral hard to back down to prior dosage in discussed. Decrease oxycodone to 5 per day next 30 days and then back to 4 per day. Rationale discussed #2 type diabetes good control to maintain same #3 hyperlipidemia prior blood work reviewed #4 hypertension good control to maintain same plan appropriate antibiotics prescribed symptom care discussed warning signs discussed. Flu shot

## 2016-04-01 ENCOUNTER — Telehealth: Payer: Self-pay | Admitting: Family Medicine

## 2016-04-01 NOTE — Telephone Encounter (Signed)
Please see medical clearance request in blue folder.

## 2016-04-03 DIAGNOSIS — Z794 Long term (current) use of insulin: Secondary | ICD-10-CM | POA: Diagnosis not present

## 2016-04-03 DIAGNOSIS — E113293 Type 2 diabetes mellitus with mild nonproliferative diabetic retinopathy without macular edema, bilateral: Secondary | ICD-10-CM | POA: Diagnosis not present

## 2016-04-03 DIAGNOSIS — H524 Presbyopia: Secondary | ICD-10-CM | POA: Diagnosis not present

## 2016-04-03 DIAGNOSIS — E119 Type 2 diabetes mellitus without complications: Secondary | ICD-10-CM | POA: Diagnosis not present

## 2016-04-05 LAB — HM DIABETES EYE EXAM

## 2016-04-16 ENCOUNTER — Telehealth: Payer: Self-pay | Admitting: Family Medicine

## 2016-04-16 NOTE — Telephone Encounter (Signed)
Patient needs Rx for alprazolam to [facility].  Pharmacy says they have faxed [place] but we haven't responded.

## 2016-04-16 NOTE — Telephone Encounter (Signed)
Spoke with patient's wife and informed her per that refills were sent into pharmacy on 03/15/16. Patient verbalized understanding.

## 2016-04-19 DIAGNOSIS — Z4689 Encounter for fitting and adjustment of other specified devices: Secondary | ICD-10-CM | POA: Diagnosis not present

## 2016-04-19 DIAGNOSIS — M4722 Other spondylosis with radiculopathy, cervical region: Secondary | ICD-10-CM | POA: Diagnosis not present

## 2016-04-19 DIAGNOSIS — M4802 Spinal stenosis, cervical region: Secondary | ICD-10-CM | POA: Diagnosis not present

## 2016-04-19 DIAGNOSIS — M5412 Radiculopathy, cervical region: Secondary | ICD-10-CM | POA: Diagnosis not present

## 2016-04-24 DIAGNOSIS — F17210 Nicotine dependence, cigarettes, uncomplicated: Secondary | ICD-10-CM | POA: Diagnosis present

## 2016-04-24 DIAGNOSIS — G4730 Sleep apnea, unspecified: Secondary | ICD-10-CM | POA: Diagnosis present

## 2016-04-24 DIAGNOSIS — M170 Bilateral primary osteoarthritis of knee: Secondary | ICD-10-CM | POA: Diagnosis present

## 2016-04-24 DIAGNOSIS — I110 Hypertensive heart disease with heart failure: Secondary | ICD-10-CM | POA: Diagnosis present

## 2016-04-24 DIAGNOSIS — M4722 Other spondylosis with radiculopathy, cervical region: Secondary | ICD-10-CM | POA: Diagnosis not present

## 2016-04-24 DIAGNOSIS — E119 Type 2 diabetes mellitus without complications: Secondary | ICD-10-CM | POA: Diagnosis present

## 2016-04-24 DIAGNOSIS — Z981 Arthrodesis status: Secondary | ICD-10-CM | POA: Diagnosis not present

## 2016-04-24 DIAGNOSIS — M4802 Spinal stenosis, cervical region: Secondary | ICD-10-CM | POA: Diagnosis not present

## 2016-04-24 DIAGNOSIS — M50121 Cervical disc disorder at C4-C5 level with radiculopathy: Secondary | ICD-10-CM | POA: Diagnosis present

## 2016-04-24 DIAGNOSIS — E1140 Type 2 diabetes mellitus with diabetic neuropathy, unspecified: Secondary | ICD-10-CM | POA: Diagnosis present

## 2016-04-24 DIAGNOSIS — I5032 Chronic diastolic (congestive) heart failure: Secondary | ICD-10-CM | POA: Diagnosis present

## 2016-04-24 DIAGNOSIS — J449 Chronic obstructive pulmonary disease, unspecified: Secondary | ICD-10-CM | POA: Diagnosis present

## 2016-04-24 DIAGNOSIS — M5412 Radiculopathy, cervical region: Secondary | ICD-10-CM | POA: Diagnosis not present

## 2016-04-24 DIAGNOSIS — G8929 Other chronic pain: Secondary | ICD-10-CM | POA: Diagnosis present

## 2016-05-15 ENCOUNTER — Other Ambulatory Visit: Payer: Self-pay | Admitting: Family Medicine

## 2016-05-23 DIAGNOSIS — M4802 Spinal stenosis, cervical region: Secondary | ICD-10-CM | POA: Diagnosis not present

## 2016-06-14 ENCOUNTER — Other Ambulatory Visit: Payer: Self-pay | Admitting: Family Medicine

## 2016-06-25 ENCOUNTER — Ambulatory Visit (INDEPENDENT_AMBULATORY_CARE_PROVIDER_SITE_OTHER): Payer: Medicare Other | Admitting: Family Medicine

## 2016-06-25 ENCOUNTER — Encounter: Payer: Self-pay | Admitting: Family Medicine

## 2016-06-25 VITALS — BP 128/82 | Ht 67.0 in | Wt 297.6 lb

## 2016-06-25 DIAGNOSIS — I10 Essential (primary) hypertension: Secondary | ICD-10-CM | POA: Diagnosis not present

## 2016-06-25 DIAGNOSIS — E785 Hyperlipidemia, unspecified: Secondary | ICD-10-CM | POA: Diagnosis not present

## 2016-06-25 DIAGNOSIS — E119 Type 2 diabetes mellitus without complications: Secondary | ICD-10-CM | POA: Diagnosis not present

## 2016-06-25 DIAGNOSIS — M792 Neuralgia and neuritis, unspecified: Secondary | ICD-10-CM

## 2016-06-25 LAB — POCT GLYCOSYLATED HEMOGLOBIN (HGB A1C): Hemoglobin A1C: 6.4

## 2016-06-25 NOTE — Progress Notes (Signed)
   Subjective:    Patient ID: Shaun Harris, male    DOB: 1966-02-05, 50 y.o.   MRN: [id number]  Diabetes  He presents for his follow-up diabetic visit. He has type 2 diabetes mellitus. Risk factors for coronary artery disease include diabetes mellitus, hypertension, dyslipidemia, obesity and sedentary lifestyle. Current diabetic treatment includes insulin injections and oral agent (monotherapy). He is compliant with treatment all of the time. His weight is stable. He is following a diabetic diet. He has not had a previous visit with a dietitian. He does not see a podiatrist.  Results for orders placed or performed in visit on 06/25/16  POCT glycosylated hemoglobin (Hb A1C)  Result Value Ref Range   Hemoglobin A1C 6.4   HM DIABETES EYE EXAM  Result Value Ref Range   HM Diabetic Eye Exam No Retinopathy No Retinopathy   Blood pressure medicine and blood pressure levels reviewed today with patient. Compliant with blood pressure medicine. States does not miss a dose. No obvious side effects. Blood pressure generally good when checked elsewhere. Watching salt intake.  Patient continues to take lipid medication regularly. No obvious side effects from it. Generally does not miss a dose. Prior blood work results are reviewed with patient. Patient continues to work on fat intake in diet  Asthma overall stableCompliant with meds. Unfortunately still smoking   Results for orders placed or performed in visit on 06/25/16  Lipid panel  Result Value Ref Range   Cholesterol, Total 151 100 - 199 mg/dL   Triglycerides 94 0 - 149 mg/dL   HDL 45 >39 mg/dL   VLDL Cholesterol Cal 19 5 - 40 mg/dL   LDL Calculated 87 0 - 99 mg/dL   Chol/HDL Ratio 3.4 0.0 - 5.0 ratio units  Hepatic function panel  Result Value Ref Range   Total Protein 6.9 6.0 - 8.5 g/dL   Albumin 4.7 3.5 - 5.5 g/dL   Bilirubin Total <0.2 0.0 - 1.2 mg/dL   Bilirubin, Direct 0.08 0.00 - 0.40 mg/dL   Alkaline Phosphatase 103 39 - 117 IU/L     AST 11 0 - 40 IU/L   ALT 16 0 - 44 IU/L  POCT glycosylated hemoglobin (Hb A1C)  Result Value Ref Range   Hemoglobin A1C 6.4   HM DIABETES EYE EXAM  Result Value Ref Range   HM Diabetic Eye Exam No Retinopathy No Retinopathy     Review of Systems No headache, no major weight loss or weight gain, no chest pain no back pain abdominal pain no change in bowel habits complete ROS otherwise negative     Objective:   Physical Exam  Alert vitals stable, NAD. Blood pressure good on repeat. HEENT normal. Lungs clear. Heart regular rate and rhythm.  C diabetic foot exam      Assessment & Plan:  Impression 1 type 2 diabetes discussed maintain same meds #2 hypertension good control discussed maintain same meds #3 asthma ongoing strongly encouraged to stop smoking #4 chronic pain ongoing patient encouraged to remain compliant plan all medications refilled. Plus above. Plus diet exercise discussed. Plus appropriate blood work further recommendations based results

## 2016-06-26 LAB — LIPID PANEL
Chol/HDL Ratio: 3.4 ratio units (ref 0.0–5.0)
Cholesterol, Total: 151 mg/dL (ref 100–199)
HDL: 45 mg/dL (ref >39)
LDL Calculated: 87 mg/dL (ref 0–99)
Triglycerides: 94 mg/dL (ref 0–149)
VLDL Cholesterol Cal: 19 mg/dL (ref 5–40)

## 2016-06-26 LAB — HEPATIC FUNCTION PANEL
ALT: 16 IU/L (ref 0–44)
AST: 11 IU/L (ref 0–40)
Albumin: 4.7 g/dL (ref 3.5–5.5)
Alkaline Phosphatase: 103 IU/L (ref 39–117)
Bilirubin Total: <0.2 mg/dL (ref 0.0–1.2)
Bilirubin, Direct: 0.08 mg/dL (ref 0.00–0.40)
Total Protein: 6.9 g/dL (ref 6.0–8.5)

## 2016-07-02 ENCOUNTER — Encounter: Payer: Self-pay | Admitting: Family Medicine

## 2016-07-13 ENCOUNTER — Other Ambulatory Visit: Payer: Self-pay | Admitting: Family Medicine

## 2016-07-18 DIAGNOSIS — M542 Cervicalgia: Secondary | ICD-10-CM | POA: Diagnosis not present

## 2016-07-18 DIAGNOSIS — M5030 Other cervical disc degeneration, unspecified cervical region: Secondary | ICD-10-CM | POA: Diagnosis not present

## 2016-07-29 ENCOUNTER — Encounter (HOSPITAL_COMMUNITY): Payer: Self-pay

## 2016-07-29 ENCOUNTER — Ambulatory Visit (HOSPITAL_COMMUNITY): Payer: Medicare Other | Attending: Orthopedic Surgery

## 2016-07-29 DIAGNOSIS — M5410 Radiculopathy, site unspecified: Secondary | ICD-10-CM | POA: Insufficient documentation

## 2016-07-29 DIAGNOSIS — M542 Cervicalgia: Secondary | ICD-10-CM | POA: Diagnosis not present

## 2016-07-29 DIAGNOSIS — R293 Abnormal posture: Secondary | ICD-10-CM | POA: Insufficient documentation

## 2016-07-29 DIAGNOSIS — M25512 Pain in left shoulder: Secondary | ICD-10-CM | POA: Diagnosis not present

## 2016-07-29 DIAGNOSIS — R29898 Other symptoms and signs involving the musculoskeletal system: Secondary | ICD-10-CM | POA: Diagnosis not present

## 2016-07-29 DIAGNOSIS — R252 Cramp and spasm: Secondary | ICD-10-CM | POA: Insufficient documentation

## 2016-07-29 DIAGNOSIS — R6889 Other general symptoms and signs: Secondary | ICD-10-CM | POA: Diagnosis not present

## 2016-07-29 NOTE — Therapy (Signed)
[place], [place], [address] Phone: [phone]   Fax:  [phone]  Physical Therapy Evaluation  Patient Details  Name: Shaun Harris MRN: [id number] Date of Birth: [birth year] Referring Provider: Rennis Harding   Encounter Date: 07/29/2016      PT End of Session - 07/29/16 1237    Visit Number 1   Number of Visits 16   Date for PT Re-Evaluation 08/29/16   Authorization Type Medicare    Authorization Time Period 07/29/16-09/26/16   Authorization - Visit Number 1   Authorization - Number of Visits 10   PT Start Time [id number]   PT Stop Time [id number]   PT Time Calculation (min) 35 min   Activity Tolerance Patient tolerated treatment well;No increased pain   Behavior During Therapy WFL for tasks assessed/performed      Past Medical History:  Diagnosis Date  . Anxiety   . Arthritis   . Cerebral vasculitis   . Chronic headaches    constant since possible stroke in 1997,"Vasculitis of brain with increased pressure of brain ".  . COPD (chronic obstructive pulmonary disease) ([place])   . COPD (chronic obstructive pulmonary disease) ([place])   . Diabetes mellitus   . Hyperlipidemia   . Hypertension   . Hypertriglyceridemia   . Sleep apnea    has but doesnt use, cannot tolerate; PCP aware  . Stroke [facility])    pt states,"they arent sure if i had a stroke in 1997, there is still a question about that with my doctors". No deficits.  . Tobacco abuse   . Ulcerative colitis ([place])   . Ulcerative colitis ([place])   . Venous stasis     Past Surgical History:  Procedure Laterality Date  . CERVICAL SPINE SURGERY  2008  . KNEE ARTHROSCOPY  02/25/2012   Procedure: ARTHROSCOPY KNEE;  Surgeon: Sanjuana Kava, MD;  Location: [facility];  Service: Orthopedics;  Laterality: Right;  . KNEE ARTHROSCOPY WITH MEDIAL MENISECTOMY Left 12/23/2014   Procedure: LEFT KNEE ARTHROSCOPY WITH PARTIAL MENISECTOMY;  Surgeon: Sanjuana Kava, MD;  Location: [facility];   Service: Orthopedics;  Laterality: Left;  . SHOULDER ARTHROSCOPY  2004   left shoulder  . SHOULDER ARTHROSCOPY  2008   right shoulder    There were no vitals filed for this visit.       Subjective Assessment - 07/29/16 1126    Subjective Pt reports he had a neck surgery on October, unlcear on details but reports is was adjacent to preexisting fusion from 2008.    Pertinent History Pt reports he had a neck surgery on October, unlcear on details but reports is was adjacent to preexisting fusion from 2008. History of RUE tingling since May 2016. Tried PT for his neck last year and reports it was mostly unsuccessful.    How long can you walk comfortably? --  Turning to fast in standing causes pain.    Patient Stated Goals resolve pain    Currently in Pain? Yes   Pain Score 8    Pain Location Neck   Pain Orientation Left;Anterior;Posterior   Pain Descriptors / Indicators Tingling;Sharp   Pain Type Surgical pain   Pain Radiating Towards Left shoulder blade   Aggravating Factors  cold weather, bending over   Pain Relieving Factors Pt reports nothing helps his pain including his pain meds, but he takes his pain meds anyway when in pain.  [facility] PT Assessment - 07/29/16 0001      Assessment   Medical Diagnosis Cervical Fusion    Referring Provider Max Cohen    Onset Date/Surgical Date 04/24/16   Hand Dominance Right   Next MD Visit 10/10/16   Prior Therapy None     Precautions   Precautions Other (comment)  Patient cannot lift any more than 5 lbs.      Restrictions   Weight Bearing Restrictions No     Balance Screen   Has the patient fallen in the past 6 months No   Has the patient had a decrease in activity level because of a fear of falling?  Yes   Is the patient reluctant to leave their home because of a fear of falling?  No     Prior Function   Level of Independence Independent with basic ADLs;Independent with household mobility without device  Will use a  4 wheel walker outside of the house   Vocation On disability     Sensation   Light Touch Appears Intact  LUe screened, intact     Coordination   Gross Motor Movements are Fluid and Coordinated Yes   Fine Motor Movements are Fluid and Coordinated Yes  Mild tremor in the thumb, reported as baseline   Finger Nose Finger Test WNL     ROM / Strength   AROM / PROM / Strength Strength;AROM;PROM     AROM   AROM Assessment Site Cervical   Cervical Flexion 26   Cervical Extension 15  aggravation of Lt scapular pain   Cervical - Right Side Bend 12   Cervical - Left Side Bend 18   Cervical - Right Rotation 39   Cervical - Left Rotation 36     PROM   PROM Assessment Site Cervical   Cervical - Right Rotation 50   Cervical - Left Rotation 44     Strength   Strength Assessment Site Shoulder;Elbow;Hand   Right/Left Shoulder Right;Left   Right Shoulder ABduction 5/5   Right Shoulder Internal Rotation 5/5   Right Shoulder External Rotation 5/5   Left Shoulder ABduction 5/5   Left Shoulder Internal Rotation 5/5   Left Shoulder External Rotation 5/5   Right/Left Elbow Right;Left   Right Elbow Flexion 5/5   Right Elbow Extension 5/5   Left Elbow Flexion 5/5   Left Elbow Extension 5/5   Right Hand Gross Grasp Functional   Left Hand Gross Grasp Functional                   [facility] Adult PT Treatment/Exercise - 07/29/16 0001      Exercises   Exercises Neck     Neck Exercises: Supine   Neck Retraction 15 reps;5 secs  in 1 pillow, HEP addition   Cervical Rotation Both;Other reps (comment);Other (comment)  stretches/PROM: 3x30sec bilat (HEP)                 PT Education - 07/29/16 1236    Education provided Yes   Education Details Explained how his cervical hypomobility is contributing to increased pain/spasm.    Person(s) Educated Patient   Methods Explanation   Comprehension Verbalized understanding          PT Short Term Goals - 07/29/16 2123      PT  SHORT TERM GOAL #1   Title After 3 weeks pt will be independent with HEP adn report good compliance.    Status New     PT  SHORT TERM GOAL #2   Title After 3 weeks pt will demonstrate improved cervical ROM by 10 degrees in all planes to demonstrate improved cervical mobility with decreased pain.   Status New           PT Long Term Goals - 07/29/16 2125      PT LONG TERM GOAL #1   Title After 6 weeks pt will be independent with advanced HEP for cervical strengthening, stretching, and postural strengthening.    Status New     PT LONG TERM GOAL #2   Title After 6 weeks pt will Improve cervical flexion, extension, and rotation by 15 degrees or more to improve pt's ability to drive and complete ADLs without pain.    Status New     PT LONG TERM GOAL #3   Title After 3 weeks patient will report decreased nerve pain on the LUE and return to PLOF in manipulation.    Status New               Plan - 07/29/16 1240    Clinical Impression Statement Pt presenting 2+ months s/p cervical surgery and 1 week after doffing of soft cervical collar. Pt presenting with Left sided pain in the scapular, Left posterior muscle spasm, left sided surgical neck pain, and gross loss of cervical mobility. Strength and sensation are intact on the operative side. Pt will benefir from skilled PT intervention to address these impairments to improve activity tolerance, decrease pain, and help patient return to PLOF in performance of ADL.    Rehab Potential Good   PT Frequency 2x / week   PT Duration 8 weeks   PT Treatment/Interventions ADLs/Self Care Home Management;Moist Heat;Therapeutic activities;Therapeutic exercise;Dry needling;Patient/family education;Scar mobilization;Passive range of motion;Manual techniques   PT Next Visit Plan Review HEP and treatment goals; continue with gentle stretching and P/ROM of the spine, and scapular stretching. Soft tissue assessment of Left upper trap and Left levator  scapulae.    PT Home Exercise Plan EVAL: Cervical retraction into pillow, supine cervical rotation PROM stretching 3x30sec bilat    Consulted and Agree with Plan of Care Patient      Patient will benefit from skilled therapeutic intervention in order to improve the following deficits and impairments:  Decreased range of motion, Obesity, Pain, Decreased activity tolerance, Decreased balance, Decreased mobility, Postural dysfunction, Increased muscle spasms  Visit Diagnosis: Cervicalgia - Plan: PT plan of care cert/re-cert  Acute pain of left shoulder - Plan: PT plan of care cert/re-cert  Cramp and spasm - Plan: PT plan of care cert/re-cert      G-Codes - [id number]    Functional Assessment Tool Used Clinical Judgment    Functional Limitation Changing and maintaining body position   Changing and Maintaining Body Position Current Status [id number]) At least 40 percent but less than 60 percent impaired, limited or restricted   Changing and Maintaining Body Position Goal Status [id number]) At least 40 percent but less than 60 percent impaired, limited or restricted       Problem List Patient Active Problem List   Diagnosis Date Noted  . Venous stasis ulcer ([place]) 03/10/2013  . COPD (chronic obstructive pulmonary disease) ([place]) 03/10/2013  . Tobacco abuse 03/10/2013  . Morbid obesity ([place]) 03/10/2013  . Diabetes ([place]) 03/10/2013  . Diabetes mellitus without complication ([place]) 123456  . HTN (hypertension) 10/01/2012  . Other and unspecified hyperlipidemia 10/01/2012  . Chronic pain syndrome 10/01/2012  . Knee pain 03/02/2012  . Knee stiffness  03/02/2012    9:31 PM, 07/29/16 Etta Grandchild, PT, DPT Physical Therapist at [facility] [phone] (office)      [place] [address] [place], [place], [address] Phone: [phone]   Fax:  [phone]  Name: Shaun Harris MRN: [id number] Date of Birth:  Apr 14, 1966

## 2016-08-02 ENCOUNTER — Encounter (HOSPITAL_COMMUNITY): Payer: Medicare Other | Admitting: Physical Therapy

## 2016-08-05 ENCOUNTER — Encounter (HOSPITAL_COMMUNITY): Payer: Medicare Other

## 2016-08-05 ENCOUNTER — Telehealth (HOSPITAL_COMMUNITY): Payer: Self-pay | Admitting: Family Medicine

## 2016-08-05 NOTE — Telephone Encounter (Signed)
08/05/16 cx today and rescheduled for 1/23 since therapist was out sick

## 2016-08-06 ENCOUNTER — Telehealth (HOSPITAL_COMMUNITY): Payer: Self-pay | Admitting: Family Medicine

## 2016-08-06 ENCOUNTER — Ambulatory Visit (HOSPITAL_COMMUNITY): Payer: Medicare Other | Admitting: Physical Therapy

## 2016-08-06 NOTE — Telephone Encounter (Signed)
08/06/16 wife said she had been calling since 8:00 to let us know he was going to be out sick today

## 2016-08-08 ENCOUNTER — Telehealth (HOSPITAL_COMMUNITY): Payer: Self-pay | Admitting: Family Medicine

## 2016-08-08 NOTE — Telephone Encounter (Signed)
08/08/16 wife called to cx appt ... She said that he has the flu

## 2016-08-09 ENCOUNTER — Ambulatory Visit (HOSPITAL_COMMUNITY): Payer: Medicare Other | Admitting: Physical Therapy

## 2016-08-12 ENCOUNTER — Encounter (HOSPITAL_COMMUNITY): Payer: Self-pay | Admitting: Physical Therapy

## 2016-08-12 ENCOUNTER — Ambulatory Visit (HOSPITAL_COMMUNITY): Payer: Medicare Other | Admitting: Physical Therapy

## 2016-08-12 DIAGNOSIS — R252 Cramp and spasm: Secondary | ICD-10-CM

## 2016-08-12 DIAGNOSIS — R29898 Other symptoms and signs involving the musculoskeletal system: Secondary | ICD-10-CM

## 2016-08-12 DIAGNOSIS — R6889 Other general symptoms and signs: Secondary | ICD-10-CM

## 2016-08-12 DIAGNOSIS — M25512 Pain in left shoulder: Secondary | ICD-10-CM | POA: Diagnosis not present

## 2016-08-12 DIAGNOSIS — M5410 Radiculopathy, site unspecified: Secondary | ICD-10-CM | POA: Diagnosis not present

## 2016-08-12 DIAGNOSIS — M542 Cervicalgia: Secondary | ICD-10-CM

## 2016-08-12 DIAGNOSIS — R293 Abnormal posture: Secondary | ICD-10-CM | POA: Diagnosis not present

## 2016-08-12 NOTE — Therapy (Signed)
[place], [place], [address] Phone: [phone]   Fax:  [phone]  Physical Therapy Treatment  Patient Details  Name: Shaun Harris MRN: [id number] Date of Birth: 01/31/1966 Referring Provider: Rennis Harding   Encounter Date: 08/12/2016      PT End of Session - 08/12/16 1127    Visit Number 2   Number of Visits 16   Date for PT Re-Evaluation 08/29/16   Authorization Type Medicare    Authorization Time Period 07/29/16-09/26/16   Authorization - Visit Number 2   Authorization - Number of Visits 10   PT Start Time 1030   PT Stop Time 1115   PT Time Calculation (min) 45 min   Activity Tolerance Patient tolerated treatment well   Behavior During Therapy [facility] for tasks assessed/performed      Past Medical History:  Diagnosis Date  . Anxiety   . Arthritis   . Cerebral vasculitis   . Chronic headaches    constant since possible stroke in 1997,"Vasculitis of brain with increased pressure of brain ".  . COPD (chronic obstructive pulmonary disease) ([place])   . COPD (chronic obstructive pulmonary disease) ([place])   . Diabetes mellitus   . Hyperlipidemia   . Hypertension   . Hypertriglyceridemia   . Sleep apnea    has but doesnt use, cannot tolerate; PCP aware  . Stroke [facility])    pt states,"they arent sure if i had a stroke in 1997, there is still a question about that with my doctors". No deficits.  . Tobacco abuse   . Ulcerative colitis ([place])   . Ulcerative colitis ([place])   . Venous stasis     Past Surgical History:  Procedure Laterality Date  . CERVICAL SPINE SURGERY  2008  . KNEE ARTHROSCOPY  02/25/2012   Procedure: ARTHROSCOPY KNEE;  Surgeon: Sanjuana Kava, MD;  Location: [facility];  Service: Orthopedics;  Laterality: Right;  . KNEE ARTHROSCOPY WITH MEDIAL MENISECTOMY Left 12/23/2014   Procedure: LEFT KNEE ARTHROSCOPY WITH PARTIAL MENISECTOMY;  Surgeon: Sanjuana Kava, MD;  Location: [facility];  Service: Orthopedics;   Laterality: Left;  . SHOULDER ARTHROSCOPY  2004   left shoulder  . SHOULDER ARTHROSCOPY  2008   right shoulder    There were no vitals filed for this visit.      Subjective Assessment - 08/12/16 1038    Subjective Pt reporting pain today of 7/10 in his neck and left shoulder.   Pertinent History Pt reports he had a neck surgery on October, unlcear on details but reports is was adjacent to preexisting fusion from 2008. History of RUE tingling since May 2016. Tried PT for his neck last year and reports it was mostly unsuccessful.    Patient Stated Goals resolve pain    Currently in Pain? Yes   Pain Score 7    Pain Location Neck   Pain Orientation Left;Anterior;Posterior   Pain Descriptors / Indicators Aching;Tingling;Stabbing   Pain Type Surgical pain   Pain Radiating Towards left shoulder blade   Pain Onset More than a month ago   Aggravating Factors  cold weather, bending, moving certain ways   Pain Relieving Factors pt reports nothing seems to help. Pt reports pain meds seem to help only a little, but can't seem to control the pain.   Multiple Pain Sites No  [place] Adult PT Treatment/Exercise - 08/12/16 0001      Self-Care   Self-Care Posture   Posture Posture education, anatomy review on cervical spine and musculature     Exercises   Exercises Neck     Neck Exercises: Supine   Neck Retraction 15 reps;5 secs  in 1 pillow, HEP addition   Cervical Rotation Both;Other reps (comment);Other (comment)  stretches/PROM: 3x30sec bilat (HEP)    Lateral Flexion Both;10 reps     Manual Therapy   Manual Therapy Soft tissue mobilization;Scapular mobilization;Manual Traction   Soft tissue mobilization upper trap bilaterally, medial scapular border   Scapular Mobilization L side only   Manual Traction occipital release, upper cervical spine                PT Education - 08/12/16 1125    Education provided Yes   Education Details  Reviewed cervica/ shoulder anatomy. Edu in new HEP addition, also discussed dry needling option   Person(s) Educated Patient   Methods Explanation;Demonstration;Handout;Tactile cues   Comprehension Verbalized understanding;Returned demonstration          PT Short Term Goals - 08/12/16 1134      PT SHORT TERM GOAL #1   Title After 3 weeks pt will be independent with HEP adn report good compliance.    Baseline Reports compliance daily   Time 3   Period Weeks   Status On-going     PT SHORT TERM GOAL #2   Title After 3 weeks pt will demonstrate improved cervical ROM by 10 degrees in all planes to demonstrate improved cervical mobility with decreased pain.   Baseline 01/17/2015:  No assistive device being used    Period Weeks   Status New           PT Long Term Goals - 07/29/16 2125      PT LONG TERM GOAL #1   Title After 6 weeks pt will be independent with advanced HEP for cervical strengthening, stretching, and postural strengthening.    Status New     PT LONG TERM GOAL #2   Title After 6 weeks pt will Improve cervical flexion, extension, and rotation by 15 degrees or more to improve pt's ability to drive and complete ADLs without pain.    Status New     PT LONG TERM GOAL #3   Title After 3 weeks patient will report decreased nerve pain on the LUE and return to PLOF in manipulation.    Status New               Plan - 08/12/16 1128    Clinical Impression Statement Pt presenting s/p cervical fusion. Pt reporting bilateral neck pain with left sided shoulder pain which radiates into medial scapular and upper/ middle trap. Trigger points noted. Pt reviewed exercises from last visit and new upper trap stretch added. Manual traction performed with some relief and soft tissue and trigger point release performed. Pt reporting less pain at end of session. Pt tolerated well. Continue skilled therapy to address pt's decrease ROM, decrease strength and increased pain.    Rehab  Potential Good   PT Frequency 2x / week   PT Duration 8 weeks   PT Treatment/Interventions ADLs/Self Care Home Management;Moist Heat;Therapeutic activities;Therapeutic exercise;Dry needling;Patient/family education;Scar mobilization;Passive range of motion;Manual techniques   PT Next Visit Plan Review HEP and treatment goals; continue with gentle stretching and PROM of the spine, and scapular stretching. Soft tissue assessment of Left upper trap and Left levator scapulae.  PT Home Exercise Plan EVAL: Cervical retraction into pillow, supine cervical rotation PROM stretching 3x30sec bilat, seated upper trap stretch with a strap    Consulted and Agree with Plan of Care Patient      Patient will benefit from skilled therapeutic intervention in order to improve the following deficits and impairments:  Decreased range of motion, Obesity, Pain, Decreased activity tolerance, Decreased balance, Decreased mobility, Postural dysfunction, Increased muscle spasms  Visit Diagnosis: Cervicalgia  Acute pain of left shoulder  Cramp and spasm  Radiculopathy affecting upper extremity  Neck pain  Poor posture  Weakness of right arm  Decreased functional activity tolerance     Problem List Patient Active Problem List   Diagnosis Date Noted  . Venous stasis ulcer ([place]) 03/10/2013  . COPD (chronic obstructive pulmonary disease) ([place]) 03/10/2013  . Tobacco abuse 03/10/2013  . Morbid obesity ([place]) 03/10/2013  . Diabetes ([place]) 03/10/2013  . Diabetes mellitus without complication ([place]) 123456  . HTN (hypertension) 10/01/2012  . Other and unspecified hyperlipidemia 10/01/2012  . Chronic pain syndrome 10/01/2012  . Knee pain 03/02/2012  . Knee stiffness 03/02/2012    Oretha Caprice, MPT 08/12/2016, 12:23 PM  [place] [address] [place], [place], [address] Phone: [phone]   Fax:  [phone]  Name: DEVONAIRE SHINGLETON MRN:  [id number] Date of Birth: [birth year]

## 2016-08-15 ENCOUNTER — Other Ambulatory Visit: Payer: Self-pay | Admitting: Family Medicine

## 2016-08-15 MED ORDER — INSULIN DETEMIR 100 UNIT/ML ~~LOC~~ SOLN: 44.0000 [IU] | Freq: Two times a day (BID) | SUBCUTANEOUS | 2 refills | Status: DC

## 2016-08-15 NOTE — Telephone Encounter (Signed)
Pharmacy called to get verbal to switch patient to Levemir in equivalent dosing to his current Lantus because his insurance no longer covers lantus but covers levemir.  Dr Richardson Landry approved the change.

## 2016-08-15 NOTE — Telephone Encounter (Signed)
Patient wants refill on oxycodone 10 mg taking 6 pills a day .Wife states the doctor who was handling his pain meds is now turning by to primary doctor. Also he is needing new prescription for Levmir  Which is close to lantus but the insurance will no longer cover so needing the levmir.

## 2016-08-15 NOTE — Telephone Encounter (Signed)
Amy called to check on this

## 2016-08-15 NOTE — Telephone Encounter (Signed)
This will require research project on the [place] registry , answer tomorrow,

## 2016-08-16 ENCOUNTER — Ambulatory Visit (HOSPITAL_COMMUNITY): Payer: Medicare Other | Attending: Orthopedic Surgery | Admitting: Physical Therapy

## 2016-08-16 DIAGNOSIS — M25512 Pain in left shoulder: Secondary | ICD-10-CM | POA: Diagnosis not present

## 2016-08-16 DIAGNOSIS — M542 Cervicalgia: Secondary | ICD-10-CM | POA: Insufficient documentation

## 2016-08-16 DIAGNOSIS — R293 Abnormal posture: Secondary | ICD-10-CM | POA: Diagnosis not present

## 2016-08-16 DIAGNOSIS — R252 Cramp and spasm: Secondary | ICD-10-CM | POA: Insufficient documentation

## 2016-08-16 DIAGNOSIS — M5410 Radiculopathy, site unspecified: Secondary | ICD-10-CM | POA: Diagnosis not present

## 2016-08-16 DIAGNOSIS — R6889 Other general symptoms and signs: Secondary | ICD-10-CM | POA: Insufficient documentation

## 2016-08-16 MED ORDER — OXYCODONE HCL 10 MG PO TABS: ORAL_TABLET | ORAL | 0 refills | Status: DC

## 2016-08-16 NOTE — Therapy (Signed)
[place], [place], [address] Phone: [phone]   Fax:  [phone]  Physical Therapy Treatment  Patient Details  Name: Shaun Harris MRN: [id number] Date of Birth: [birth year] Referring Provider: Rennis Harding   Encounter Date: 08/16/2016      PT End of Session - 08/16/16 0748    Visit Number 3   Number of Visits 16   Date for PT Re-Evaluation 08/29/16   Authorization Type Medicare    Authorization Time Period 07/29/16-09/26/16   Authorization - Visit Number 3   Authorization - Number of Visits 10   PT Start Time 0730   PT Stop Time 0815   PT Time Calculation (min) 45 min   Activity Tolerance Patient tolerated treatment well   Behavior During Therapy [facility] for tasks assessed/performed      Past Medical History:  Diagnosis Date  . Anxiety   . Arthritis   . Cerebral vasculitis   . Chronic headaches    constant since possible stroke in 1997,"Vasculitis of brain with increased pressure of brain ".  . COPD (chronic obstructive pulmonary disease) ([place])   . COPD (chronic obstructive pulmonary disease) ([place])   . Diabetes mellitus   . Hyperlipidemia   . Hypertension   . Hypertriglyceridemia   . Sleep apnea    has but doesnt use, cannot tolerate; PCP aware  . Stroke [facility])    pt states,"they arent sure if i had a stroke in 1997, there is still a question about that with my doctors". No deficits.  . Tobacco abuse   . Ulcerative colitis ([place])   . Ulcerative colitis ([place])   . Venous stasis     Past Surgical History:  Procedure Laterality Date  . CERVICAL SPINE SURGERY  2008  . KNEE ARTHROSCOPY  02/25/2012   Procedure: ARTHROSCOPY KNEE;  Surgeon: Sanjuana Kava, MD;  Location: [facility];  Service: Orthopedics;  Laterality: Right;  . KNEE ARTHROSCOPY WITH MEDIAL MENISECTOMY Left 12/23/2014   Procedure: LEFT KNEE ARTHROSCOPY WITH PARTIAL MENISECTOMY;  Surgeon: Sanjuana Kava, MD;  Location: [facility];  Service: Orthopedics;   Laterality: Left;  . SHOULDER ARTHROSCOPY  2004   left shoulder  . SHOULDER ARTHROSCOPY  2008   right shoulder    There were no vitals filed for this visit.      Subjective Assessment - 08/16/16 0735    Subjective Pt reports that he is about the same since his evaluation. He has been performing his HEP daily.    Pertinent History Pt reports he had a neck surgery on October, unlcear on details but reports is was adjacent to preexisting fusion from 2008. History of RUE tingling since May 2016. Tried PT for his neck last year and reports it was mostly unsuccessful.    Patient Stated Goals resolve pain    Currently in Pain? Yes   Pain Score 6    Pain Location Neck   Pain Orientation Left;Posterior   Pain Descriptors / Indicators Aching   Pain Type Surgical pain   Pain Radiating Towards Lt shoulder blade during flexion and extension    Pain Onset More than a month ago   Aggravating Factors  moving certain ways    Pain Relieving Factors nothing                          [facility] Adult PT Treatment/Exercise - 08/16/16 0001      Neck Exercises: Seated  Neck Retraction 15 reps;3 secs   Cervical Rotation 5 reps;Both   Cervical Rotation Limitations 10 sec stretch with towel    Other Seated Exercise scapular retraction x10 reps, 3 sec hold      Neck Exercises: Supine   Neck Retraction 15 reps;5 secs   Cervical Rotation Both;5 reps   Cervical Rotation Limitations 15 sec hold      Manual Therapy   Manual Therapy Soft tissue mobilization;Passive ROM;Joint mobilization   Manual therapy comments separate rest of session    Joint Mobilization Grade 3 central PAs upper thoracic    Soft tissue mobilization Lt upper trap/levator scap region    Passive ROM Lt levator scap stretch 4x20 sec      Neck Exercises: Stretches   Chest Stretch 3 reps;30 seconds  in doorway                 PT Education - 08/16/16 0838    Education provided Yes          PT Short Term  Goals - 08/12/16 1134      PT SHORT TERM GOAL #1   Title After 3 weeks pt will be independent with HEP adn report good compliance.    Baseline Reports compliance daily   Time 3   Period Weeks   Status On-going     PT SHORT TERM GOAL #2   Title After 3 weeks pt will demonstrate improved cervical ROM by 10 degrees in all planes to demonstrate improved cervical mobility with decreased pain.   Baseline 01/17/2015:  No assistive device being used    Period Weeks   Status New           PT Long Term Goals - 07/29/16 2125      PT LONG TERM GOAL #1   Title After 6 weeks pt will be independent with advanced HEP for cervical strengthening, stretching, and postural strengthening.    Status New     PT LONG TERM GOAL #2   Title After 6 weeks pt will Improve cervical flexion, extension, and rotation by 15 degrees or more to improve pt's ability to drive and complete ADLs without pain.    Status New     PT LONG TERM GOAL #3   Title After 3 weeks patient will report decreased nerve pain on the LUE and return to PLOF in manipulation.    Status New               Plan - 08/16/16 [id number]    Clinical Impression Statement Pt presents today with continued cervical and Lt scapular pain. He is performing his HEP regularly and needed minimal corrections needed during today's session. Introduced more exercises to address postural limitations and stiffness throughout his thoracic spine. Ended with manual techniques to further address this, noting increased muscle spasm along the Lt levator scap, which reproduced some of the pain and tightness he reports during cervical flexion/extension. Ended session with reported improvements in stiffness. Will continue with current POC.   Rehab Potential Good   PT Frequency 2x / week   PT Duration 8 weeks   PT Treatment/Interventions ADLs/Self Care Home Management;Moist Heat;Therapeutic activities;Therapeutic exercise;Dry needling;Patient/family education;Scar  mobilization;Passive range of motion;Manual techniques   PT Next Visit Plan levator scap STM/TPR and scapular stretching. thoracic mobility; cervical strengthening   PT Home Exercise Plan EVAL: Cervical retraction into pillow, supine cervical rotation PROM stretching 3x30sec bilat, seated upper trap stretch with a strap    Consulted and Agree  with Plan of Care Patient      Patient will benefit from skilled therapeutic intervention in order to improve the following deficits and impairments:  Decreased range of motion, Obesity, Pain, Decreased activity tolerance, Decreased balance, Decreased mobility, Postural dysfunction, Increased muscle spasms  Visit Diagnosis: Cervicalgia  Acute pain of left shoulder  Cramp and spasm  Radiculopathy affecting upper extremity     Problem List Patient Active Problem List   Diagnosis Date Noted  . Venous stasis ulcer ([place]) 03/10/2013  . COPD (chronic obstructive pulmonary disease) ([place]) 03/10/2013  . Tobacco abuse 03/10/2013  . Morbid obesity ([place]) 03/10/2013  . Diabetes ([place]) 03/10/2013  . Diabetes mellitus without complication ([place]) 123456  . HTN (hypertension) 10/01/2012  . Other and unspecified hyperlipidemia 10/01/2012  . Chronic pain syndrome 10/01/2012  . Knee pain 03/02/2012  . Knee stiffness 03/02/2012   9:18 AM,08/16/16 Elly Modena PT, DPT [facility] [place] [address] [place], [place], [address] Phone: [phone]   Fax:  [phone]  Name: Shaun Harris MRN: [id number] Date of Birth: 1965-09-23

## 2016-08-16 NOTE — Telephone Encounter (Signed)
08/16/16  Morphine 30mg  #60 one BID ---Dr Sherlyn Lick- Sp[facility] center in [place]  07/19/16  Morphine 30mg  #60 one BID ---Dr Sherlyn Lick             Oxycodone 10mg   #180 one q4hrs prn pain----  Dr Sherlyn Lick   Same 2  meds filled by Spine center in November and December as well per pharmacy

## 2016-08-16 NOTE — Telephone Encounter (Signed)
Sorry lease contct his pharmacy and see how many narcotis given and name of specialist and when prescribed, registry id on other computer and cannot accss right now

## 2016-08-16 NOTE — Telephone Encounter (Signed)
Amy (wife) notified this many months post surgical we need to cut down patients oxycodone, recommend 120 per month. Wife verbalized understanding. Script ready for pickup.

## 2016-08-16 NOTE — Telephone Encounter (Signed)
This many months post surg we need to cut down pts oxycodone, rec 120 per month, we can start this 30 d after last rx

## 2016-08-16 NOTE — Addendum Note (Signed)
Addended by: Jesusita Oka on: 08/16/2016 02:41 PM   Modules accepted: Orders

## 2016-08-19 ENCOUNTER — Ambulatory Visit (HOSPITAL_COMMUNITY): Payer: Medicare Other | Admitting: Physical Therapy

## 2016-08-19 DIAGNOSIS — M25512 Pain in left shoulder: Secondary | ICD-10-CM

## 2016-08-19 DIAGNOSIS — R6889 Other general symptoms and signs: Secondary | ICD-10-CM | POA: Diagnosis not present

## 2016-08-19 DIAGNOSIS — M542 Cervicalgia: Secondary | ICD-10-CM | POA: Diagnosis not present

## 2016-08-19 DIAGNOSIS — R252 Cramp and spasm: Secondary | ICD-10-CM | POA: Diagnosis not present

## 2016-08-19 DIAGNOSIS — R293 Abnormal posture: Secondary | ICD-10-CM

## 2016-08-19 DIAGNOSIS — M5410 Radiculopathy, site unspecified: Secondary | ICD-10-CM | POA: Diagnosis not present

## 2016-08-19 NOTE — Therapy (Signed)
[place], [place], [address] Phone: [phone]   Fax:  [phone]  Physical Therapy Treatment  Patient Details  Name: GAYLOR RAWLINGS MRN: [id number] Date of Birth: 1966/01/30 Referring Provider: Rennis Harding   Encounter Date: 08/19/2016      PT End of Session - 08/19/16 1056    Visit Number 4   Number of Visits 16   Date for PT Re-Evaluation 08/29/16   Authorization Type Medicare    Authorization Time Period 07/29/16-09/26/16   Authorization - Visit Number 4   Authorization - Number of Visits 10   PT Start Time 1030   PT Stop Time 1115   PT Time Calculation (min) 45 min   Activity Tolerance Patient tolerated treatment well   Behavior During Therapy [facility] for tasks assessed/performed      Past Medical History:  Diagnosis Date  . Anxiety   . Arthritis   . Cerebral vasculitis   . Chronic headaches    constant since possible stroke in 1997,"Vasculitis of brain with increased pressure of brain ".  . COPD (chronic obstructive pulmonary disease) ([place])   . COPD (chronic obstructive pulmonary disease) ([place])   . Diabetes mellitus   . Hyperlipidemia   . Hypertension   . Hypertriglyceridemia   . Sleep apnea    has but doesnt use, cannot tolerate; PCP aware  . Stroke [facility])    pt states,"they arent sure if i had a stroke in 1997, there is still a question about that with my doctors". No deficits.  . Tobacco abuse   . Ulcerative colitis ([place])   . Ulcerative colitis ([place])   . Venous stasis     Past Surgical History:  Procedure Laterality Date  . CERVICAL SPINE SURGERY  2008  . KNEE ARTHROSCOPY  02/25/2012   Procedure: ARTHROSCOPY KNEE;  Surgeon: Sanjuana Kava, MD;  Location: [facility];  Service: Orthopedics;  Laterality: Right;  . KNEE ARTHROSCOPY WITH MEDIAL MENISECTOMY Left 12/23/2014   Procedure: LEFT KNEE ARTHROSCOPY WITH PARTIAL MENISECTOMY;  Surgeon: Sanjuana Kava, MD;  Location: [facility];  Service: Orthopedics;   Laterality: Left;  . SHOULDER ARTHROSCOPY  2004   left shoulder  . SHOULDER ARTHROSCOPY  2008   right shoulder    There were no vitals filed for this visit.      Subjective Assessment - 08/19/16 1053    Subjective Pt arriving today still reoporting 6-7/10 on the left side of his neck. Pt reporting he has been doing his HEP daily.    Pertinent History Pt reports he had a neck surgery on October, unlcear on details but reports is was adjacent to preexisting fusion from 2008. History of RUE tingling since May 2016. Tried PT for his neck last year and reports it was mostly unsuccessful.    Patient Stated Goals resolve pain    Pain Score 7    Pain Location Neck   Pain Orientation Left;Posterior   Pain Descriptors / Indicators Aching   Pain Type Surgical pain   Pain Radiating Towards left shoulder blade during flexion and extension   Pain Onset More than a month ago   Aggravating Factors  moving in certain  positions   Pain Relieving Factors nothing   Multiple Pain Sites No                         [facility] Adult PT Treatment/Exercise - 08/19/16 0001      Neck Exercises:  Seated   Neck Retraction 15 reps;3 secs   Other Seated Exercise scapular retraction x10 reps, 3 sec hold    Other Seated Exercise Rows with green theraband x 10 holding 3 seconds each     Neck Exercises: Supine   Cervical Isometrics Flexion;Extension;Right lateral flexion;Left lateral flexion;3 secs;5 reps   Neck Retraction 15 reps;5 secs   Cervical Rotation Both;5 reps   Cervical Rotation Limitations 15 sec hold    Other Supine Exercise cervical rotation holding 20 seconds x 3 to each side.      Modalities   Modalities Moist Heat     Moist Heat Therapy   Number Minutes Moist Heat 10 Minutes   Moist Heat Location Cervical     Manual Therapy   Manual Therapy Soft tissue mobilization;Passive ROM;Joint mobilization   Soft tissue mobilization Lt upper trap/levator scap region    Passive ROM Lt  levator scap stretch 4x20 sec      Neck Exercises: Stretches   Chest Stretch 3 reps;20 seconds  corner stretch                  PT Short Term Goals - 08/19/16 1118      PT SHORT TERM GOAL #1   Title After 3 weeks pt will be independent with HEP adn report good compliance.    Baseline Reports compliance daily   Time 3   Period Weeks   Status On-going     PT SHORT TERM GOAL #2   Title After 3 weeks pt will demonstrate improved cervical ROM by 10 degrees in all planes to demonstrate improved cervical mobility with decreased pain.   Baseline 01/17/2015:  No assistive device being used    Time 3   Period Weeks   Status New     PT SHORT TERM GOAL #3   Title Improve R shoulder flexion and abduction to 140 or greater to equal L side and to allow for improved function.    Baseline 8/15; Rt IR 25; LT 22; B ER 35   Period Weeks   Status Achieved           PT Long Term Goals - 08/19/16 1119      PT LONG TERM GOAL #1   Title After 6 weeks pt will be independent with advanced HEP for cervical strengthening, stretching, and postural strengthening.    Time 6   Period Weeks   Status New     PT LONG TERM GOAL #2   Title After 6 weeks pt will Improve cervical flexion, extension, and rotation by 15 degrees or more to improve pt's ability to drive and complete ADLs without pain.    Time 6   Period Weeks   Status New               Plan - 08/19/16 1057    Clinical Impression Statement pt presents today with continued cervical and left scapular pain of 6-7/10. Pt reporting doing his HEP daily. Pt tolerated treatment well reporting pain of 6/10 at end of session. Pt with upper trap, middle trap, levator tightness noted more with cervical flexion/extension. Pt reported decreased stiffness at end of session. Discussed possible mechanical traction for upcoming sessions.    Rehab Potential Good   PT Frequency 2x / week   PT Duration 8 weeks   PT Treatment/Interventions  ADLs/Self Care Home Management;Moist Heat;Therapeutic activities;Therapeutic exercise;Dry needling;Patient/family education;Scar mobilization;Passive range of motion;Manual techniques   PT Next Visit Plan levator scap STM/TPR and  scapular stretching. thoracic mobility; cervical strengthening, cervical traction   PT Home Exercise Plan EVAL: Cervical retraction into pillow, supine cervical rotation PROM stretching 3x30sec bilat, seated upper trap stretch with a strap    Consulted and Agree with Plan of Care Patient      Patient will benefit from skilled therapeutic intervention in order to improve the following deficits and impairments:  Decreased range of motion, Obesity, Pain, Decreased activity tolerance, Decreased balance, Decreased mobility, Postural dysfunction, Increased muscle spasms  Visit Diagnosis: Cervicalgia  Acute pain of left shoulder  Cramp and spasm  Poor posture  Neck pain  Decreased functional activity tolerance     Problem List Patient Active Problem List   Diagnosis Date Noted  . Venous stasis ulcer ([place]) 03/10/2013  . COPD (chronic obstructive pulmonary disease) ([place]) 03/10/2013  . Tobacco abuse 03/10/2013  . Morbid obesity ([place]) 03/10/2013  . Diabetes ([place]) 03/10/2013  . Diabetes mellitus without complication ([place]) 123456  . HTN (hypertension) 10/01/2012  . Other and unspecified hyperlipidemia 10/01/2012  . Chronic pain syndrome 10/01/2012  . Knee pain 03/02/2012  . Knee stiffness 03/02/2012    Oretha Caprice, MPT 08/19/2016, 11:21 AM  [place] [address] [place], [place], [address] Phone: [phone]   Fax:  [phone]  Name: HENNESSY ADA MRN: [id number] Date of Birth: 12/03/1965

## 2016-08-23 ENCOUNTER — Ambulatory Visit (HOSPITAL_COMMUNITY): Payer: Medicare Other | Admitting: Physical Therapy

## 2016-08-23 DIAGNOSIS — M25512 Pain in left shoulder: Secondary | ICD-10-CM | POA: Diagnosis not present

## 2016-08-23 DIAGNOSIS — M542 Cervicalgia: Secondary | ICD-10-CM

## 2016-08-23 DIAGNOSIS — M5410 Radiculopathy, site unspecified: Secondary | ICD-10-CM | POA: Diagnosis not present

## 2016-08-23 DIAGNOSIS — R252 Cramp and spasm: Secondary | ICD-10-CM | POA: Diagnosis not present

## 2016-08-23 DIAGNOSIS — R293 Abnormal posture: Secondary | ICD-10-CM

## 2016-08-23 DIAGNOSIS — R6889 Other general symptoms and signs: Secondary | ICD-10-CM | POA: Diagnosis not present

## 2016-08-23 NOTE — Therapy (Signed)
[place], [place], [address] Phone: [phone]   Fax:  [phone]  Physical Therapy Treatment  Patient Details  Name: Shaun Harris MRN: [id number] Date of Birth: 1965/11/02 Referring Provider: Rennis Harding   Encounter Date: 08/23/2016      PT End of Session - 08/23/16 0858    Visit Number 5   Number of Visits 16   Date for PT Re-Evaluation 08/29/16   Authorization Type Medicare    Authorization Time Period 07/29/16-09/26/16   Authorization - Visit Number 5   Authorization - Number of Visits 10   PT Start Time [id number]   PT Stop Time 0814   PT Time Calculation (min) 43 min   Activity Tolerance Patient tolerated treatment well;No increased pain   Behavior During Therapy [facility] for tasks assessed/performed      Past Medical History:  Diagnosis Date  . Anxiety   . Arthritis   . Cerebral vasculitis   . Chronic headaches    constant since possible stroke in 1997,"Vasculitis of brain with increased pressure of brain ".  . COPD (chronic obstructive pulmonary disease) ([place])   . COPD (chronic obstructive pulmonary disease) ([place])   . Diabetes mellitus   . Hyperlipidemia   . Hypertension   . Hypertriglyceridemia   . Sleep apnea    has but doesnt use, cannot tolerate; PCP aware  . Stroke [facility])    pt states,"they arent sure if i had a stroke in 1997, there is still a question about that with my doctors". No deficits.  . Tobacco abuse   . Ulcerative colitis ([place])   . Ulcerative colitis ([place])   . Venous stasis     Past Surgical History:  Procedure Laterality Date  . CERVICAL SPINE SURGERY  2008  . KNEE ARTHROSCOPY  02/25/2012   Procedure: ARTHROSCOPY KNEE;  Surgeon: Sanjuana Kava, MD;  Location: [facility];  Service: Orthopedics;  Laterality: Right;  . KNEE ARTHROSCOPY WITH MEDIAL MENISECTOMY Left 12/23/2014   Procedure: LEFT KNEE ARTHROSCOPY WITH PARTIAL MENISECTOMY;  Surgeon: Sanjuana Kava, MD;  Location: [facility];   Service: Orthopedics;  Laterality: Left;  . SHOULDER ARTHROSCOPY  2004   left shoulder  . SHOULDER ARTHROSCOPY  2008   right shoulder    There were no vitals filed for this visit.      Subjective Assessment - 08/23/16 0732    Subjective Pt reports that his pain has remained the same since his last session. He continues to perform his HEP.    Pertinent History Pt reports he had a neck surgery on October, unlcear on details but reports is was adjacent to preexisting fusion from 2008. History of RUE tingling since May 2016. Tried PT for his neck last year and reports it was mostly unsuccessful.    Patient Stated Goals resolve pain    Currently in Pain? Yes  Same as last visit    Pain Onset More than a month ago                         [facility] Adult PT Treatment/Exercise - 08/23/16 0001      Neck Exercises: Standing   Other Standing Exercises standing rows x20 reps, blue TB   Other Standing Exercises UE pressdown with blue TB 2x15 reps      Neck Exercises: Seated   Neck Retraction 15 reps;3 secs   Neck Retraction Limitations verbal cues for proper technique  Cervical Rotation 10 reps;Both   Cervical Rotation Limitations deep neck flexor activation with rotation  increased symptoms with rotation Lt, Pulling with rotation R   Other Seated Exercise seated thoracic extension over chair x5 reps from T8 to T3     Manual Therapy   Manual Therapy Soft tissue mobilization   Manual therapy comments separate rest of session    Joint Mobilization upper thoracic mobilization From T5 to T2   Soft tissue mobilization STM and trigger point release along Lt upper trap/levator scap   Scapular Mobilization Lt scapular upward rotation    Passive ROM Lt levator scap stretch 3x30 sec                 PT Education - 08/23/16 0858    Education provided Yes   Education Details addition to HEP, encouraged continued HEP adherence and discussed possible dry needling to address  noted trigger points/muscle spasm    Person(s) Educated Patient   Methods Explanation;Demonstration   Comprehension Verbalized understanding;Returned demonstration          PT Short Term Goals - 08/19/16 1118      PT SHORT TERM GOAL #1   Title After 3 weeks pt will be independent with HEP adn report good compliance.    Baseline Reports compliance daily   Time 3   Period Weeks   Status On-going     PT SHORT TERM GOAL #2   Title After 3 weeks pt will demonstrate improved cervical ROM by 10 degrees in all planes to demonstrate improved cervical mobility with decreased pain.   Baseline 01/17/2015:  No assistive device being used    Time 3   Period Weeks   Status New     PT SHORT TERM GOAL #3   Title Improve R shoulder flexion and abduction to 140 or greater to equal L side and to allow for improved function.    Baseline 8/15; Rt IR 25; LT 22; B ER 35   Period Weeks   Status Achieved           PT Long Term Goals - 08/19/16 1119      PT LONG TERM GOAL #1   Title After 6 weeks pt will be independent with advanced HEP for cervical strengthening, stretching, and postural strengthening.    Time 6   Period Weeks   Status New     PT LONG TERM GOAL #2   Title After 6 weeks pt will Improve cervical flexion, extension, and rotation by 15 degrees or more to improve pt's ability to drive and complete ADLs without pain.    Time 6   Period Weeks   Status New               Plan - 08/23/16 [id number]    Clinical Impression Statement Pt continues to present with muscle spasm and discomfort along his Lt scapular region. Today's session introduced more exercises to address noted limitations in thoracic mobility and continued with scapular/cervical strengthening exercises. Pt does continue to need cues for proper technique. Ended session without increase in pain but only reported improvements in overall stiffness.   Rehab Potential Good   PT Frequency 2x / week   PT Duration 8 weeks    PT Treatment/Interventions ADLs/Self Care Home Management;Moist Heat;Therapeutic activities;Therapeutic exercise;Dry needling;Patient/family education;Scar mobilization;Passive range of motion;Manual techniques   PT Next Visit Plan thoracic mobility; cervical strengthening, possible TPDN if with [facility]    PT Home Exercise Plan EVAL: Cervical retraction into  pillow, supine cervical rotation PROM stretching 3x30sec bilat, seated upper trap stretch with a strap, rows with blue TB   Consulted and Agree with Plan of Care Patient      Patient will benefit from skilled therapeutic intervention in order to improve the following deficits and impairments:  Decreased range of motion, Obesity, Pain, Decreased activity tolerance, Decreased balance, Decreased mobility, Postural dysfunction, Increased muscle spasms  Visit Diagnosis: Cervicalgia  Acute pain of left shoulder  Cramp and spasm  Poor posture     Problem List Patient Active Problem List   Diagnosis Date Noted  . Venous stasis ulcer ([place]) 03/10/2013  . COPD (chronic obstructive pulmonary disease) ([place]) 03/10/2013  . Tobacco abuse 03/10/2013  . Morbid obesity ([place]) 03/10/2013  . Diabetes ([place]) 03/10/2013  . Diabetes mellitus without complication ([place]) 123456  . HTN (hypertension) 10/01/2012  . Other and unspecified hyperlipidemia 10/01/2012  . Chronic pain syndrome 10/01/2012  . Knee pain 03/02/2012  . Knee stiffness 03/02/2012    9:54 AM,08/23/16 Elly Modena PT, DPT [facility] [place] [address] [place], [place], [address] Phone: [phone]   Fax:  [phone]  Name: Shaun Harris MRN: [id number] Date of Birth: 27-Jan-1966

## 2016-08-26 ENCOUNTER — Ambulatory Visit (HOSPITAL_COMMUNITY): Payer: Medicare Other

## 2016-08-27 ENCOUNTER — Ambulatory Visit (HOSPITAL_COMMUNITY): Payer: Medicare Other

## 2016-08-27 DIAGNOSIS — M5410 Radiculopathy, site unspecified: Secondary | ICD-10-CM | POA: Diagnosis not present

## 2016-08-27 DIAGNOSIS — M542 Cervicalgia: Secondary | ICD-10-CM

## 2016-08-27 DIAGNOSIS — M25512 Pain in left shoulder: Secondary | ICD-10-CM | POA: Diagnosis not present

## 2016-08-27 DIAGNOSIS — R252 Cramp and spasm: Secondary | ICD-10-CM | POA: Diagnosis not present

## 2016-08-27 DIAGNOSIS — R293 Abnormal posture: Secondary | ICD-10-CM | POA: Diagnosis not present

## 2016-08-27 DIAGNOSIS — R6889 Other general symptoms and signs: Secondary | ICD-10-CM | POA: Diagnosis not present

## 2016-08-27 NOTE — Therapy (Signed)
Marland KitchenPHYSICAL THERAPY DISCHARGE SUMMARY  Visits from Start of Care:  6 Reassessment performed today. The patient has not made any improvement in cervical spine ROM (passive or active), tolerance to activitiy, or pain level. The patient reports good compliance with HEP. The patient has made no progress toward goals. All pain and dysfunction related to the RUE have been resovled since his surgery, however he continues to c/o pain in the Lt side since surgery which is worse with movements of the cervical spine. He reports numbness and tingling into the Left upper trap distribution, left scapular, and referring down the dorsal arm into the digits 2 and 3. Making improvements to the patient's CC have been limited, even within session, and the patient remains fixated on his symptoms and limitations. The patient gives somewhat vague feedback at times regarding his response to treatments and has been unable to consistently provide feedback to better guide interventions. Pt is being referred back to MD at this point for further assessment and will be discharged from PT services at time.   Current functional level related to goals / functional outcomes: *see below   Remaining deficits: *see below   Education / Equipment: *see below Plan: Patient agrees to discharge.  Patient goals were not met. Patient is being discharged due to lack of progress.  ?????     2:15 PM, 08/27/16 Etta Grandchild, PT, DPT Physical Therapist at [facility] [phone] (office)                                 [facility] [address] [place], [place], [address] Phone: [phone]   Fax:  [phone]  Physical Therapy Treatment  Patient Details  Name: Shaun Harris MRN: [id number] Date of Birth: 1965-10-09 Referring Provider: Rennis Harding   Encounter Date: 08/27/2016      PT End of Session - 08/27/16 1213    Visit  Number 6   Number of Visits 16   Date for PT Re-Evaluation 08/29/16   Authorization Type Medicare    Authorization Time Period 07/29/16-09/26/16   Authorization - Visit Number 6   Authorization - Number of Visits 10   PT Start Time 1033   PT Stop Time 1105   PT Time Calculation (min) 32 min   Activity Tolerance Patient tolerated treatment well;Patient limited by pain      Past Medical History:  Diagnosis Date  . Anxiety   . Arthritis   . Cerebral vasculitis   . Chronic headaches    constant since possible stroke in 1997,"Vasculitis of brain with increased pressure of brain ".  . COPD (chronic obstructive pulmonary disease) ([place])   . COPD (chronic obstructive pulmonary disease) ([place])   . Diabetes mellitus   . Hyperlipidemia   . Hypertension   . Hypertriglyceridemia   . Sleep apnea    has but doesnt use, cannot tolerate; PCP aware  . Stroke [facility])    pt states,"they arent sure if i had a stroke in 1997, there is still a question about that with my doctors". No deficits.  . Tobacco abuse   . Ulcerative colitis ([place])   . Ulcerative colitis ([place])   . Venous stasis     Past Surgical History:  Procedure Laterality Date  . CERVICAL SPINE SURGERY  2008  . KNEE ARTHROSCOPY  02/25/2012   Procedure: ARTHROSCOPY KNEE;  Surgeon: Sanjuana Kava, MD;  Location: [facility];  Service: Orthopedics;  Laterality: Right;  . KNEE ARTHROSCOPY WITH MEDIAL MENISECTOMY Left 12/23/2014   Procedure: LEFT KNEE ARTHROSCOPY WITH PARTIAL MENISECTOMY;  Surgeon: Sanjuana Kava, MD;  Location: [facility];  Service: Orthopedics;  Laterality: Left;  . SHOULDER ARTHROSCOPY  2004   left shoulder  . SHOULDER ARTHROSCOPY  2008   right shoulder    There were no vitals filed for this visit.      Subjective Assessment - 08/27/16 1037    Subjective Pt reports since beginning PT 1 month ago, he does nto notice any improvemnent. Daily activities are abotu as difficult adn his pain is just as wrose. He has been consistent  with his HEP which makes him sore.    Pertinent History Pt reports he had a neck surgery on October, unlcear on details but reports is was adjacent to preexisting fusion from 2008. History of RUE tingling since May 2016. Tried PT for his neck last year and reports it was mostly unsuccessful.    Patient Stated Goals resolve pain    Currently in Pain? Yes   Pain Score 6    Pain Location Neck  Left lateral upper neck to Left shoulder, along upper trap distribution.    Pain Orientation Left   Pain Descriptors / Indicators Stabbing   Pain Type Surgical pain   Pain Onset More than a month ago   Aggravating Factors  sudden movements with catching    Pain Relieving Factors nothing            [facility] PT Assessment - 08/27/16 0001      Assessment   Medical Diagnosis Cervical Fusion    Referring Provider Max Cohen    Onset Date/Surgical Date 04/24/16   Hand Dominance Right   Next MD Visit 10/10/16   Prior Therapy None     Precautions   Precautions Other (comment)  Patient cannot lift any more than 5 lbs.      Restrictions   Weight Bearing Restrictions No     Sensation   Light Touch Appears Intact  Intact at evaluation     AROM   AROM Assessment Site Cervical   Cervical Flexion 22   Cervical Extension 22   Cervical - Right Side Bend 8   Cervical - Left Side Bend 22   Cervical - Right Rotation 46   Cervical - Left Rotation 37     PROM   PROM Assessment Site Cervical   Cervical Flexion 35   Cervical - Right Side Bend 25   Cervical - Left Side Bend 25   Cervical - Right Rotation 45   Cervical - Left Rotation 45     Strength   Right Elbow Flexion 5/5   Right Elbow Extension 5/5   Left Elbow Flexion 5/5   Left Elbow Extension 5/5                     [facility] Adult PT Treatment/Exercise - 08/27/16 0001      Neck Exercises: Seated   Neck Retraction Limitations Cervical extension seated:   15x bilat for AROM   Cervical Rotation Both;15 reps  15x bilat for AROM      Manual Therapy   Manual Therapy Passive ROM   Manual therapy comments bilat cerv rotationp; bilat upper cerv rot; bilat lat flexion   3x30sec each                  PT Short Term Goals -  2016-09-26 1118      PT SHORT TERM GOAL #1   Title After 3 weeks pt will be independent with HEP and report good compliance.    Baseline Reports compliance daily   Time 3   Period Weeks   Status Achieved     PT SHORT TERM GOAL #2   Title After 3 weeks pt will demonstrate improved cervical ROM by 10 degrees in all planes to demonstrate improved cervical mobility with decreased pain.   Time 3   Period Weeks   Status Not Met           PT Long Term Goals - 09-26-16 1120      PT LONG TERM GOAL #1   Title After 6 weeks pt will be independent with advanced HEP for cervical strengthening, stretching, and postural strengthening.    Time 6   Period Weeks   Status Not Met     PT LONG TERM GOAL #2   Title After 6 weeks pt will Improve cervical flexion, extension, and rotation by 15 degrees or more to improve pt's ability to drive and complete ADLs without pain.    Time 6   Period Weeks   Status Not Met     PT LONG TERM GOAL #3   Title After 6 weeks patient will report decreased referral pain on the LUE and return to PLOF    Time 6   Period Weeks   Status Not Met               Plan - 09/26/16 1349    Clinical Impression Statement Reassessment performed today. The patient has not made any improvement in cervical spine ROM (passive or active), tolerance to activitiy, or pain level. The patient reports good compliance with HEP. The patient has made no progress toward goals. All pain and dysfunction related to the RUE have been resovled since his surgery, however he continues to c/o pain in the Lt side since surgery which is worse with movements of the cervical spine. He reports numbness and tingling into the Left upper trap distribution, left scapular, and referring down the  dorsal arm into the digits 2 and 3. Making improvements to the patient's CC have been limited, even within session, and the patient remains fixated on his symptoms and limitations. The patient gives somewhat vague feedback at times regarding his response to treatments and has been unable to consistently provide feedback to better guide interventions. Pt is being referred back to MD at this point for further assessment and will be discharged from PT services at time.    Rehab Potential Good   PT Frequency 2x / week   PT Duration 8 weeks   PT Treatment/Interventions ADLs/Self Care Home Management;Moist Heat;Therapeutic activities;Therapeutic exercise;Dry needling;Patient/family education;Scar mobilization;Passive range of motion;Manual techniques   PT Home Exercise Plan EVAL: Cervical retraction into pillow, supine cervical rotation PROM stretching 3x30sec bilat, seated upper trap stretch with a strap, rows with blue TB   Consulted and Agree with Plan of Care Patient      Patient will benefit from skilled therapeutic intervention in order to improve the following deficits and impairments:  Decreased range of motion, Obesity, Pain, Decreased activity tolerance, Decreased balance, Decreased mobility, Postural dysfunction, Increased muscle spasms  Visit Diagnosis: Cervicalgia  Acute pain of left shoulder  Cramp and spasm       G-Codes - 2016-09-26 1210    Functional Assessment Tool Used Clinical Judgment    Functional Limitation Changing and maintaining  body position   Changing and Maintaining Body Position Goal Status [phone]) At least 40 percent but less than 60 percent impaired, limited or restricted   Changing and Maintaining Body Position Discharge Status ([id number]) At least 40 percent but less than 60 percent impaired, limited or restricted      Problem List Patient Active Problem List   Diagnosis Date Noted  . Venous stasis ulcer ([place]) 03/10/2013  . COPD (chronic obstructive pulmonary  disease) ([place]) 03/10/2013  . Tobacco abuse 03/10/2013  . Morbid obesity ([place]) 03/10/2013  . Diabetes ([place]) 03/10/2013  . Diabetes mellitus without complication ([place]) 68/37/2902  . HTN (hypertension) 10/01/2012  . Other and unspecified hyperlipidemia 10/01/2012  . Chronic pain syndrome 10/01/2012  . Knee pain 03/02/2012  . Knee stiffness 03/02/2012    Buccola,Allan C 08/27/2016, 2:13 PM  [facility] [address] [place], [place], [address] Phone: [phone]   Fax:  [phone]  Name: TAYSHUN GAPPA MRN: [id number] Date of Birth: [birth year]  2:15 PM, 08/27/16 Etta Grandchild, PT, DPT Physical Therapist at [facility] [phone] (office)

## 2016-08-28 DIAGNOSIS — Z6841 Body Mass Index (BMI) 40.0 and over, adult: Secondary | ICD-10-CM | POA: Diagnosis not present

## 2016-08-28 DIAGNOSIS — F17210 Nicotine dependence, cigarettes, uncomplicated: Secondary | ICD-10-CM | POA: Diagnosis not present

## 2016-08-28 DIAGNOSIS — E669 Obesity, unspecified: Secondary | ICD-10-CM | POA: Diagnosis not present

## 2016-08-28 DIAGNOSIS — J988 Other specified respiratory disorders: Secondary | ICD-10-CM | POA: Diagnosis not present

## 2016-08-28 DIAGNOSIS — R942 Abnormal results of pulmonary function studies: Secondary | ICD-10-CM | POA: Diagnosis not present

## 2016-08-28 DIAGNOSIS — J449 Chronic obstructive pulmonary disease, unspecified: Secondary | ICD-10-CM | POA: Diagnosis not present

## 2016-08-29 ENCOUNTER — Telehealth: Payer: Self-pay | Admitting: Family Medicine

## 2016-08-29 MED ORDER — INSULIN DETEMIR 100 UNIT/ML ~~LOC~~ SOLN: 52.0000 [IU] | Freq: Two times a day (BID) | SUBCUTANEOUS | 2 refills | Status: DC

## 2016-08-29 NOTE — Telephone Encounter (Signed)
Currently on levermir 44 units BID and nova log per sliding scale

## 2016-08-29 NOTE — Telephone Encounter (Signed)
incr levemir to 52 bid, in seven d if glus not down to good range incr to 58 bid

## 2016-08-29 NOTE — Telephone Encounter (Signed)
Patient wife Amy states his sugar is running 250-600. She just took it 30 minutes ago and it was 371. She thinks insulin not working due to they had to change it

## 2016-08-29 NOTE — Telephone Encounter (Signed)
Patient advised to increase levemir to 52 units BID-if sugars not looking better in 7 days increase to levemir 58 units BID. Patient verbalized understanding and med list updated

## 2016-08-30 ENCOUNTER — Ambulatory Visit (HOSPITAL_COMMUNITY): Payer: Medicare Other | Admitting: Physical Therapy

## 2016-09-02 ENCOUNTER — Ambulatory Visit (HOSPITAL_COMMUNITY): Payer: Medicare Other | Admitting: Physical Therapy

## 2016-09-03 DIAGNOSIS — M5412 Radiculopathy, cervical region: Secondary | ICD-10-CM | POA: Diagnosis not present

## 2016-09-03 DIAGNOSIS — M4802 Spinal stenosis, cervical region: Secondary | ICD-10-CM | POA: Diagnosis not present

## 2016-09-03 DIAGNOSIS — M4722 Other spondylosis with radiculopathy, cervical region: Secondary | ICD-10-CM | POA: Diagnosis not present

## 2016-09-03 DIAGNOSIS — M5030 Other cervical disc degeneration, unspecified cervical region: Secondary | ICD-10-CM | POA: Diagnosis not present

## 2016-09-05 ENCOUNTER — Other Ambulatory Visit: Payer: Self-pay | Admitting: Orthopedic Surgery

## 2016-09-05 DIAGNOSIS — M5030 Other cervical disc degeneration, unspecified cervical region: Secondary | ICD-10-CM

## 2016-09-05 DIAGNOSIS — M542 Cervicalgia: Secondary | ICD-10-CM

## 2016-09-06 ENCOUNTER — Other Ambulatory Visit: Payer: Self-pay | Admitting: Family Medicine

## 2016-09-06 ENCOUNTER — Ambulatory Visit (HOSPITAL_COMMUNITY): Payer: Medicare Other | Admitting: Physical Therapy

## 2016-09-09 ENCOUNTER — Ambulatory Visit (HOSPITAL_COMMUNITY): Payer: Medicare Other

## 2016-09-12 ENCOUNTER — Other Ambulatory Visit: Payer: Self-pay | Admitting: Family Medicine

## 2016-09-13 ENCOUNTER — Encounter (HOSPITAL_COMMUNITY): Payer: Medicare Other | Admitting: Physical Therapy

## 2016-09-16 ENCOUNTER — Encounter (HOSPITAL_COMMUNITY): Payer: Medicare Other

## 2016-09-19 ENCOUNTER — Other Ambulatory Visit: Payer: Self-pay

## 2016-09-19 ENCOUNTER — Ambulatory Visit
Admission: RE | Admit: 2016-09-19 | Discharge: 2016-09-19 | Disposition: A | Discharge status: Home / Self Care | Payer: Medicare Other | Source: Ambulatory Visit | Attending: Orthopedic Surgery | Admitting: Orthopedic Surgery

## 2016-09-19 DIAGNOSIS — M5412 Radiculopathy, cervical region: Secondary | ICD-10-CM | POA: Diagnosis not present

## 2016-09-19 DIAGNOSIS — M542 Cervicalgia: Secondary | ICD-10-CM

## 2016-09-19 DIAGNOSIS — M5030 Other cervical disc degeneration, unspecified cervical region: Secondary | ICD-10-CM

## 2016-09-19 MED ORDER — HYDROMORPHONE HCL 2 MG/ML IJ SOLN
2.0000 mg | Freq: Once | INTRAMUSCULAR | Status: AC
  Administered 2016-09-19: 2 mg via INTRAMUSCULAR

## 2016-09-19 MED ORDER — HYDROXYZINE HCL 50 MG/ML IM SOLN
25.0000 mg | Freq: Once | INTRAMUSCULAR | Status: AC
  Administered 2016-09-19: 25 mg via INTRAMUSCULAR

## 2016-09-19 MED ORDER — IOPAMIDOL (ISOVUE-M 300) INJECTION 61%
10.0000 mL | Freq: Once | INTRAMUSCULAR | Status: AC | PRN
  Administered 2016-09-19: 10 mL via INTRATHECAL

## 2016-09-19 MED ORDER — DIAZEPAM 5 MG PO TABS
10.0000 mg | ORAL_TABLET | Freq: Once | ORAL | Status: AC
  Administered 2016-09-19: 10 mg via ORAL

## 2016-09-19 NOTE — Discharge Instructions (Signed)
Myelogram Discharge Instructions  1. Go home and rest quietly for the next 24 hours.  It is important to lie flat for the next 24 hours.  Get up only to go to the restroom.  You may lie in the bed or on a couch on your back, your stomach, your left side or your right side.  You may have one pillow under your head.  You may have pillows between your knees while you are on your side or under your knees while you are on your back.  2. DO NOT drive today.  Recline the seat as far back as it will go, while still wearing your seat belt, on the way home.  3. You may get up to go to the bathroom as needed.  You may sit up for 10 minutes to eat.  You may resume your normal diet and medications unless otherwise indicated.  Drink plenty of extra fluids today and tomorrow.  4. The incidence of a spinal headache with nausea and/or vomiting is about 5% (one in 20 patients).  If you develop a headache, lie flat and drink plenty of fluids until the headache goes away.  Caffeinated beverages may be helpful.  If you develop severe nausea and vomiting or a headache that does not go away with flat bed rest, call [phone].  5. You may resume normal activities after your 24 hours of bed rest is over; however, do not exert yourself strongly or do any heavy lifting tomorrow.  6. Call your physician for a follow-up appointment.    You may resume Bupropion/Wellbutrin on Friday, September 20, 2016 after 9:30a.m.

## 2016-09-19 NOTE — Progress Notes (Signed)
Patient states he has been off Bupropion for at least the past two days.  jkl

## 2016-09-20 ENCOUNTER — Encounter (HOSPITAL_COMMUNITY): Payer: Medicare Other

## 2016-09-23 ENCOUNTER — Other Ambulatory Visit: Payer: Self-pay | Admitting: *Deleted

## 2016-09-23 MED ORDER — INSULIN DETEMIR 100 UNIT/ML ~~LOC~~ SOLN: SUBCUTANEOUS | 0 refills | Status: DC

## 2016-09-24 ENCOUNTER — Ambulatory Visit: Payer: Medicare Other | Admitting: Family Medicine

## 2016-09-24 DIAGNOSIS — M5412 Radiculopathy, cervical region: Secondary | ICD-10-CM | POA: Diagnosis not present

## 2016-09-24 DIAGNOSIS — M4722 Other spondylosis with radiculopathy, cervical region: Secondary | ICD-10-CM | POA: Diagnosis not present

## 2016-09-24 DIAGNOSIS — M5030 Other cervical disc degeneration, unspecified cervical region: Secondary | ICD-10-CM | POA: Diagnosis not present

## 2016-09-24 DIAGNOSIS — M4802 Spinal stenosis, cervical region: Secondary | ICD-10-CM | POA: Diagnosis not present

## 2016-09-25 ENCOUNTER — Ambulatory Visit (INDEPENDENT_AMBULATORY_CARE_PROVIDER_SITE_OTHER): Payer: Medicare Other | Admitting: Family Medicine

## 2016-09-25 ENCOUNTER — Encounter: Payer: Self-pay | Admitting: Family Medicine

## 2016-09-25 VITALS — BP 142/94 | Temp 98.4°F | Ht 67.0 in | Wt 287.0 lb

## 2016-09-25 DIAGNOSIS — M67431 Ganglion, right wrist: Secondary | ICD-10-CM | POA: Diagnosis not present

## 2016-09-25 DIAGNOSIS — M792 Neuralgia and neuritis, unspecified: Secondary | ICD-10-CM

## 2016-09-25 DIAGNOSIS — K529 Noninfective gastroenteritis and colitis, unspecified: Secondary | ICD-10-CM

## 2016-09-25 DIAGNOSIS — K625 Hemorrhage of anus and rectum: Secondary | ICD-10-CM

## 2016-09-25 DIAGNOSIS — E119 Type 2 diabetes mellitus without complications: Secondary | ICD-10-CM

## 2016-09-25 LAB — POCT GLYCOSYLATED HEMOGLOBIN (HGB A1C): Hemoglobin A1C: 7.5

## 2016-09-25 MED ORDER — INSULIN DETEMIR 100 UNIT/ML ~~LOC~~ SOLN: SUBCUTANEOUS | 0 refills | Status: DC

## 2016-09-25 MED ORDER — MORPHINE SULFATE ER 30 MG PO TBCR: 30.0000 mg | EXTENDED_RELEASE_TABLET | Freq: Two times a day (BID) | ORAL | 0 refills | Status: DC

## 2016-09-25 MED ORDER — HYDROCORTISONE 2.5 % RE CREA: 1.0000 "application " | TOPICAL_CREAM | Freq: Two times a day (BID) | RECTAL | 2 refills | Status: DC

## 2016-09-25 MED ORDER — OXYCODONE HCL 10 MG PO TABS: ORAL_TABLET | ORAL | 0 refills | Status: DC

## 2016-09-25 NOTE — Progress Notes (Signed)
Subjective:    Patient ID: Shaun Harris, male    DOB: 1966/02/22, 51 y.o.   MRN: [id number]  Diabetes  He presents for his follow-up diabetic visit. He has type 2 diabetes mellitus. He is following a diabetic diet. He rarely participates in exercise. Home blood sugar record trend: 300's to 600's. He does not see a podiatrist.Eye exam is current (sept 2017).  A1C today 7.5.  This patient was seen today for chronic pain. Takes for headaches.   The medication list was reviewed and updated.   -Compliance with medication: yes  - Number patient states they take daily: oxycodone four times daily and morphine twice a day  -when was the last dose patient took? Last night  The patient was advised the importance of maintaining medication and not using illegal substances with these.  Refills needed: yes  The patient was educated that we can provide 3 monthly scripts for their medication, it is their responsibility to follow the instructions.  Side effects or complications from medications: none  Patient is aware that pain medications are meant to minimize the severity of the pain to allow their pain levels to improve to allow for better function. They are aware of that pain medications cannot totally remove their pain.  Due for UDT ( at least once per year) : last one 6/617  Results for orders placed or performed in visit on 09/25/16  POCT glycosylated hemoglobin (Hb A1C)  Result Value Ref Range   Hemoglobin A1C 7.5    58 levemir takes 8 and 8 Claims compliance with insulin notes sugars are rising considerably  Patient claims compliance with diabetes medication. No obvious side effects. Reports no substantial low sugar spells. Most numbers are generally in good range when checked fasting. Generally does not miss a dose of medication. Watching diabetic diet closely  Blood pressure medicine and blood pressure levels reviewed today with patient. Compliant with blood pressure medicine.  States does not miss a dose. No obvious side effects. Blood pressure generally good when checked elsewhere. Watching salt intake.   Unfortunately asthma acting up at times. Using inhaler more frequently lately. Also unfortunately smoking is ongoing    Cyst on right wrist. Progressively painful. Recalls no injury. Has seen Dr. Aline Brochure and Dr. Luna Glasgow in the past.   Rectal bleeding. Started 2 -3 weeks ago. Rectal pain. Patient has history of colitis. We had maintained his medication in this regard for long time. Has not seen the gastroenterologist. Starting the see blood in his stools intermittent spotting. Minimal abdominal pain. Some perirectal discomfort with bowel movements. No constipation.     Review of Systems No headache, no major weight loss or weight gain, no chest pain no back pain abdominal pain no change in bowel habits complete ROS otherwise negative     Objective:   Physical Exam  Alert and oriented, vitals reviewed and stable, NAD ENT-TM's and ext canals WNL bilat via otoscopic exam Soft palate, tonsils and post pharynx WNL via oropharyngeal exam Neck-symmetric, no masses; thyroid nonpalpable and nontender Pulmonary-no tachypnea or accessory muscle use; Clear without wheezes via auscultation Card--no abnrml murmurs, rhythm reg and rate WNL Carotid pulses symmetric, without bruits       Assessment & Plan:  Impression 1 patient arrives office with considerable worsening of numerous issues #1 progressive severe painful ganglionic cyst right wrist. Discussed options. Would like to press on and see orthopedics for probable surgery #2 history of colitis and now blood per rectum. Not hemodynamically  unstable however needs further workup rationale discussed. We'll press on with referral. #3 type 2 diabetes insulin requiring with sugars rising considerably discussed. We'll adjust insulin No. 4 hypertension fair control #5 chronic pain ongoing and severe and substantial.  Patient on numerous meds, rationale for pain control discussed. Patient claims no excess side effects. Easily 40 minutes covering this patient's very complex concerns. Many questions answered. We will work on 2 referrals. We will initiate Anusol HC further perirectal management. Warning signs discussed as far as bleeding. Hold off and x-rays a wrist allow the orthopedist to do this. Adjust insulin dosing [name]. Refill pain medication. Recheck as scheduled  Greater than 50% of this 40 minute face to face visit was spent in counseling and discussion and coordination of care regarding the above diagnosis/diagnosies

## 2016-09-25 NOTE — Progress Notes (Signed)
w

## 2016-09-26 ENCOUNTER — Encounter: Payer: Self-pay | Admitting: Family Medicine

## 2016-10-02 ENCOUNTER — Ambulatory Visit (INDEPENDENT_AMBULATORY_CARE_PROVIDER_SITE_OTHER): Payer: Medicare Other | Admitting: Internal Medicine

## 2016-10-02 ENCOUNTER — Encounter (INDEPENDENT_AMBULATORY_CARE_PROVIDER_SITE_OTHER): Payer: Self-pay | Admitting: Internal Medicine

## 2016-10-02 VITALS — BP 146/84 | HR 84 | Temp 98.1°F | Ht 67.0 in | Wt 285.8 lb

## 2016-10-02 DIAGNOSIS — K51211 Ulcerative (chronic) proctitis with rectal bleeding: Secondary | ICD-10-CM | POA: Diagnosis not present

## 2016-10-02 DIAGNOSIS — K6289 Other specified diseases of anus and rectum: Secondary | ICD-10-CM | POA: Diagnosis not present

## 2016-10-02 DIAGNOSIS — K625 Hemorrhage of anus and rectum: Secondary | ICD-10-CM

## 2016-10-02 LAB — CBC WITH DIFFERENTIAL/PLATELET
Basophils Absolute: 85 cells/uL (ref 0–200)
Basophils Relative: 1 %
Eosinophils Absolute: 170 cells/uL (ref 15–500)
Eosinophils Relative: 2 %
HCT: 43.7 % (ref 38.5–50.0)
Hemoglobin: 14.3 g/dL (ref 13.2–17.1)
Lymphocytes Relative: 25 %
Lymphs Abs: 2125 cells/uL (ref 850–3900)
MCH: 29.8 pg (ref 27.0–33.0)
MCHC: 32.7 g/dL (ref 32.0–36.0)
MCV: 91 fL (ref 80.0–100.0)
MPV: 10.2 fL (ref 7.5–12.5)
Monocytes Absolute: 510 cells/uL (ref 200–950)
Monocytes Relative: 6 %
Neutro Abs: 5610 cells/uL (ref 1500–7800)
Neutrophils Relative %: 66 %
Platelets: 218 10*3/uL (ref 140–400)
RBC: 4.8 MIL/uL (ref 4.20–5.80)
RDW: 13.3 % (ref 11.0–15.0)
WBC: 8.5 10*3/uL (ref 3.8–10.8)

## 2016-10-02 MED ORDER — MESALAMINE 1000 MG RE SUPP: 1000.0000 mg | Freq: Every day | RECTAL | 1 refills | Status: DC

## 2016-10-02 NOTE — Patient Instructions (Signed)
Rx for Canasa supp sent to his pharmacy. Will schedule a colonoscopy.

## 2016-10-02 NOTE — Progress Notes (Addendum)
Subjective:    Patient ID: Shaun Harris, male    DOB: [birth year], 51 y.o.   MRN: [id number]  HPI Referred by Dr. Wolfgang Phoenix for rectal bleeding. Tells me about a month ago, he had rectal bleeding. He says he continues to have some rectal bleeding. He sees blood about every day. He also says he has soreness in his rectum.  Hx of UC and maintained on Apriso 0.375mg  four times day. Has been followed by Dr. Mickie Hillier.  His last colonoscopy was in July of 2000. Diagnosed in 1998 per Dr. Olevia Perches records when he presented with copious nonbloody diarrhea.   01/24/1999 Colonoscopy Dr. Laural Golden:  Exam performed to the cecum. No evidence of active colitis. Patchy edema at the sigmoid colon and rectum. Biopsies taken.  Diagnosis: colon ascending colon: focal active acute inflammation. Colon descending: Mild increase in chronic inflammation. Colon sigmoid and rectum: chronic changes changes but significant inflammation. Hyperplastic polyp.    He has a BM everyday. Sometimes he will have 4-5 stools a day.  His appetite is good.  He has had weight loss which was intentional.  Hx of diabetes for about 20 yrs. States he plans to have a knee replacement in the near future. Neck surgery in October of 2017.    Review of Systems Past Medical History:  Diagnosis Date  . Anxiety   . Arthritis   . Cerebral vasculitis   . Chronic headaches    constant since possible stroke in 1997,"Vasculitis of brain with increased pressure of brain ".  . COPD (chronic obstructive pulmonary disease) ([place])   . COPD (chronic obstructive pulmonary disease) ([place])   . Diabetes mellitus   . Hyperlipidemia   . Hypertension   . Hypertriglyceridemia   . Sleep apnea    has but doesnt use, cannot tolerate; PCP aware  . Stroke [facility])    pt states,"they arent sure if i had a stroke in 1997, there is still a question about that with my doctors". No deficits.  . Tobacco abuse   . Ulcerative colitis ([place])   . Ulcerative colitis ([place])     . Venous stasis     Past Surgical History:  Procedure Laterality Date  . CERVICAL SPINE SURGERY  2008  . KNEE ARTHROSCOPY  02/25/2012   Procedure: ARTHROSCOPY KNEE;  Surgeon: Sanjuana Kava, MD;  Location: [facility];  Service: Orthopedics;  Laterality: Right;  . KNEE ARTHROSCOPY WITH MEDIAL MENISECTOMY Left 12/23/2014   Procedure: LEFT KNEE ARTHROSCOPY WITH PARTIAL MENISECTOMY;  Surgeon: Sanjuana Kava, MD;  Location: [facility];  Service: Orthopedics;  Laterality: Left;  . SHOULDER ARTHROSCOPY  2004   left shoulder  . SHOULDER ARTHROSCOPY  2008   right shoulder    Allergies  Allergen Reactions  . Gabapentin Other (See Comments)    Increased blood sugar    Current Outpatient Prescriptions on File Prior to Visit  Medication Sig Dispense Refill  . ADVAIR DISKUS 250-50 MCG/DOSE AEPB USE 1 INHALATION BY MOUTH EVERY 12 HOURS. <<RINSE MOUTH AFTER USE>>. 180 each 0  . ALPRAZolam (XANAX) 1 MG tablet Take 1 tablet (1 mg total) by mouth 2 (two) times daily as needed. 60 tablet 5  . APRISO 0.375 g 24 hr capsule TAKE (1) CAPSULE BY MOUTH FOUR TIMES DAILY. 120 capsule 11  . aspirin EC 325 MG tablet Take 325 mg by mouth at bedtime. Reported on 01/23/2016    . atorvastatin (LIPITOR) 20 MG tablet TAKE 1 TABLET BY MOUTH AT BEDTIME FOR CHOLESTEROL 90  tablet 0  . buPROPion (WELLBUTRIN SR) 150 MG 12 hr tablet Take 150 mg by mouth 2 (two) times daily.     . enalapril (VASOTEC) 20 MG tablet TAKE (2) TABLETS BY MOUTH ONCE DAILY. 180 tablet 0  . furosemide (LASIX) 20 MG tablet TAKE 1 TABLET BY MOUTH ONCE DAILY. 90 tablet 0  . glipiZIDE (GLUCOTROL XL) 10 MG 24 hr tablet TAKE 2 TABLETS BY MOUTH ONCE DAILY FOR DIABETES 180 tablet 0  . hydrocortisone (ANUSOL-HC) 2.5 % rectal cream Place 1 application rectally 2 (two) times daily. 30 g 2  . hyoscyamine (LEVSIN SL) 0.125 MG SL tablet TAKE (1) TABLET UNDER TONGUE EVERY FOUR HOURS AS NEEDED FOR CRAMPING. 30 tablet 0  . insulin aspart (NOVOLOG) 100 UNIT/ML injection INJECT  S.Q. 4 TO 12 UNITS FOUR TIMES DAILY PER SLIDING SCALE. 10 mL 0  . insulin detemir (LEVEMIR) 100 UNIT/ML injection Inject 64 units bid. May titrate up to 70 units (Patient taking differently: 2 (two) times daily. Inject 64 units bid. May titrate up to 70 units) 30 mL 0  . levETIRAcetam (KEPPRA) 500 MG tablet TAKE 3 TABLETS BY MOUTH TWICE DAILY 540 tablet 0  . morphine (MS CONTIN) 30 MG 12 hr tablet Take 1 tablet (30 mg total) by mouth 2 (two) times daily. 60 tablet 0  . Multiple Vitamins-Minerals (CENTRUM PO) Take by mouth daily. Reported on 01/23/2016    . niacin (NIASPAN) 1000 MG CR tablet TAKE (2) TABLETS BY MOUTH ONCE DAILY. 180 tablet 0  . Oxycodone HCl 10 MG TABS Take 1 tablet by mouth 4 times a day as needed for pain. Do not drive or operate machinery while on this medicine. 120 tablet 0  . pregabalin (LYRICA) 50 MG capsule Take 50 mg by mouth 2 (two) times daily.     Marland Kitchen terazosin (HYTRIN) 10 MG capsule TAKE (1) CAPSULE BY MOUTH ONCE DAILY. 90 capsule 0  . tiZANidine (ZANAFLEX) 4 MG tablet TAKE 1 TABLET THREE TIMES DAILY AS NEEDED FOR MUSCLE SPASM 90 tablet 11  . TRUEPLUS INSULIN SYRINGE 31G X 5/16" 0.5 ML MISC USE AS DIRECTED 100 each prn  . VENTOLIN HFA 108 (90 Base) MCG/ACT inhaler INHALE 2 PUFFS BY MOUTH EVERY 6 HOURS AS NEEDED FOR SHORTNESS OF BREAT H 18 each 11  . vitamin C (ASCORBIC ACID) 500 MG tablet Take 500 mg by mouth daily. Reported on 01/23/2016    . zinc gluconate 50 MG tablet Take 50 mg by mouth. Reported on 01/23/2016    . Alcohol Swabs (PHARMACIST CHOICE ALCOHOL) PADS Reported on 01/23/2016    . NON FORMULARY Reported on 01/23/2016     No current facility-administered medications on file prior to visit.        Objective:   Physical Exam Blood pressure (!) 146/84, pulse 84, temperature 98.1 F (36.7 C), height 5\' 7"  (1.702 m), weight 285 lb 12.8 oz (129.6 kg). Alert and oriented. Skin warm and dry. Oral mucosa is moist.   . Sclera anicteric, conjunctivae is pink. Thyroid not  enlarged. No cervical lymphadenopathy. Lungs clear. Heart regular rate and rhythm.  Abdomen is soft. Bowel sounds are positive. No hepatomegaly. No abdominal masses felt. No tenderness.  No edema to lower extremities.  Rectal exam: stool brown and guaiac negative. Rectal exam was very painful.  I did not feel any mases.        Assessment & Plan:  Rectal bleeding/pain. Hx of UC.  Continue the Apriso.  Rx for Canasa supp.and samples  given. CBC and crp Colonoscopy with propofol.  Will try to locate old colonoscopy report. (Received).

## 2016-10-03 ENCOUNTER — Telehealth (INDEPENDENT_AMBULATORY_CARE_PROVIDER_SITE_OTHER): Payer: Self-pay | Admitting: *Deleted

## 2016-10-03 ENCOUNTER — Encounter (INDEPENDENT_AMBULATORY_CARE_PROVIDER_SITE_OTHER): Payer: Self-pay | Admitting: *Deleted

## 2016-10-03 ENCOUNTER — Ambulatory Visit (INDEPENDENT_AMBULATORY_CARE_PROVIDER_SITE_OTHER): Payer: Medicare Other | Admitting: Orthopaedic Surgery

## 2016-10-03 ENCOUNTER — Encounter: Payer: Self-pay | Admitting: Orthopaedic Surgery

## 2016-10-03 VITALS — BP 175/104 | HR 100 | Temp 98.1°F | Ht 67.0 in | Wt 288.0 lb

## 2016-10-03 DIAGNOSIS — M67431 Ganglion, right wrist: Secondary | ICD-10-CM

## 2016-10-03 DIAGNOSIS — F17210 Nicotine dependence, cigarettes, uncomplicated: Secondary | ICD-10-CM | POA: Diagnosis not present

## 2016-10-03 LAB — C-REACTIVE PROTEIN: CRP: 13.5 mg/L — ABNORMAL HIGH (ref <8.0)

## 2016-10-03 NOTE — Patient Instructions (Signed)
Steps to Quit Smoking Smoking tobacco can be bad for your health. It can also affect almost every organ in your body. Smoking puts you and people around you at risk for many serious long-lasting (chronic) diseases. Quitting smoking is hard, but it is one of the best things that you can do for your health. It is never too late to quit. What are the benefits of quitting smoking? When you quit smoking, you lower your risk for getting serious diseases and conditions. They can include:  Lung cancer or lung disease.  Heart disease.  Stroke.  Heart attack.  Not being able to have children (infertility).  Weak bones (osteoporosis) and broken bones (fractures). If you have coughing, wheezing, and shortness of breath, those symptoms may get better when you quit. You may also get sick less often. If you are pregnant, quitting smoking can help to lower your chances of having a baby of low birth weight. What can I do to help me quit smoking? Talk with your doctor about what can help you quit smoking. Some things you can do (strategies) include:  Quitting smoking totally, instead of slowly cutting back how much you smoke over a period of time.  Going to in-person counseling. You are more likely to quit if you go to many counseling sessions.  Using resources and support systems, such as:  Online chats with a counselor.  Phone quitlines.  Printed self-help materials.  Support groups or group counseling.  Text messaging programs.  Mobile phone apps or applications.  Taking medicines. Some of these medicines may have nicotine in them. If you are pregnant or breastfeeding, do not take any medicines to quit smoking unless your doctor says it is okay. Talk with your doctor about counseling or other things that can help you. Talk with your doctor about using more than one strategy at the same time, such as taking medicines while you are also going to in-person counseling. This can help make quitting  easier. What things can I do to make it easier to quit? Quitting smoking might feel very hard at first, but there is a lot that you can do to make it easier. Take these steps:  Talk to your family and friends. Ask them to support and encourage you.  Call phone quitlines, reach out to support groups, or work with a counselor.  Ask people who smoke to not smoke around you.  Avoid places that make you want (trigger) to smoke, such as:  Bars.  Parties.  Smoke-break areas at work.  Spend time with people who do not smoke.  Lower the stress in your life. Stress can make you want to smoke. Try these things to help your stress:  Getting regular exercise.  Deep-breathing exercises.  Yoga.  Meditating.  Doing a body scan. To do this, close your eyes, focus on one area of your body at a time from head to toe, and notice which parts of your body are tense. Try to relax the muscles in those areas.  Download or buy apps on your mobile phone or tablet that can help you stick to your quit plan. There are many free apps, such as QuitGuide from the CDC (Centers for Disease Control and Prevention). You can find more support from smokefree.gov and other websites. This information is not intended to replace advice given to you by your health care provider. Make sure you discuss any questions you have with your health care provider. Document Released: 04/27/2009 Document Revised: 02/27/2016 Document   Reviewed: 11/15/2014 Elsevier Interactive Patient Education  2017 [employer] Inc.  

## 2016-10-03 NOTE — Progress Notes (Signed)
Patient Shaun Harris, male DOB:05/30/1966, 51 y.o. FUX:[id number]  Chief Complaint  Patient presents with  . Wrist Problem    right wrist cyst    HPI  Shaun Harris is a 51 y.o. male who has had a ganglion cyst of the right volar wrist over the radial artery for over a year and a half.  He has said it was small until the last two months and now has grown to be as large as a small grape.  It bothers him.  He has no trauma, no redness, no numbness.  He would like something done for it.  I have explained what a ganglion cyst is and how he got it.  I have recommended aspiration, but that may not take care of it and he may need surgical excision in the future. HPI  Body mass index is 45.11 kg/m.  ROS  Review of Systems  Constitutional: Positive for fatigue.       Patient has Diabetes Mellitus. Patient has hypertension. Patient has COPD or shortness of breath. Patient has BMI > 35. Patient has current smoking history.  HENT: Negative for congestion.   Respiratory: Positive for cough and shortness of breath.   Cardiovascular: Positive for leg swelling. Negative for chest pain.  Endocrine: Positive for cold intolerance.  Musculoskeletal: Positive for arthralgias, gait problem, joint swelling and myalgias.  Allergic/Immunologic: Positive for environmental allergies.    Past Medical History:  Diagnosis Date  . Anxiety   . Arthritis   . Cerebral vasculitis   . Chronic headaches    constant since possible stroke in 1997,"Vasculitis of brain with increased pressure of brain ".  . COPD (chronic obstructive pulmonary disease) ([place])   . COPD (chronic obstructive pulmonary disease) ([place])   . Diabetes mellitus   . Hyperlipidemia   . Hypertension   . Hypertriglyceridemia   . Sleep apnea    has but doesnt use, cannot tolerate; PCP aware  . Stroke [facility])    pt states,"they arent sure if i had a stroke in 1997, there is still a question about that with my doctors". No deficits.  .  Tobacco abuse   . Ulcerative colitis ([place])   . Ulcerative colitis ([place])   . Venous stasis     Past Surgical History:  Procedure Laterality Date  . CERVICAL SPINE SURGERY  2008  . KNEE ARTHROSCOPY  02/25/2012   Procedure: ARTHROSCOPY KNEE;  Surgeon: Sanjuana Kava, MD;  Location: [facility];  Service: Orthopedics;  Laterality: Right;  . KNEE ARTHROSCOPY WITH MEDIAL MENISECTOMY Left 12/23/2014   Procedure: LEFT KNEE ARTHROSCOPY WITH PARTIAL MENISECTOMY;  Surgeon: Sanjuana Kava, MD;  Location: [facility];  Service: Orthopedics;  Laterality: Left;  . SHOULDER ARTHROSCOPY  2004   left shoulder  . SHOULDER ARTHROSCOPY  2008   right shoulder    Family History  Problem Relation Age of Onset  . Hypertension Father   . Diabetes Father   . Heart disease Father   . Heart disease Maternal Grandfather   . Heart attack Other   . Heart disease Brother     both brothers have heart disease  . Hypertension Sister     Social History Social History  Substance Use Topics  . Smoking status: Current Every Day Smoker    Packs/day: 1.00    Years: 35.00    Types: Cigarettes  . Smokeless tobacco: Never Used  . Alcohol use No    Allergies  Allergen Reactions  . Gabapentin Other (See Comments)  Increased blood sugar    Current Outpatient Prescriptions  Medication Sig Dispense Refill  . ADVAIR DISKUS 250-50 MCG/DOSE AEPB USE 1 INHALATION BY MOUTH EVERY 12 HOURS. <<RINSE MOUTH AFTER USE>>. 180 each 0  . Alcohol Swabs (PHARMACIST CHOICE ALCOHOL) PADS Reported on 01/23/2016    . ALPRAZolam (XANAX) 1 MG tablet Take 1 tablet (1 mg total) by mouth 2 (two) times daily as needed. 60 tablet 5  . APRISO 0.375 g 24 hr capsule TAKE (1) CAPSULE BY MOUTH FOUR TIMES DAILY. 120 capsule 11  . aspirin EC 325 MG tablet Take 325 mg by mouth at bedtime. Reported on 01/23/2016    . atorvastatin (LIPITOR) 20 MG tablet TAKE 1 TABLET BY MOUTH AT BEDTIME FOR CHOLESTEROL 90 tablet 0  . buPROPion (WELLBUTRIN SR) 150 MG 12 hr  tablet Take 150 mg by mouth 2 (two) times daily.     . enalapril (VASOTEC) 20 MG tablet TAKE (2) TABLETS BY MOUTH ONCE DAILY. 180 tablet 0  . furosemide (LASIX) 20 MG tablet TAKE 1 TABLET BY MOUTH ONCE DAILY. 90 tablet 0  . glipiZIDE (GLUCOTROL XL) 10 MG 24 hr tablet TAKE 2 TABLETS BY MOUTH ONCE DAILY FOR DIABETES 180 tablet 0  . hydrocortisone (ANUSOL-HC) 2.5 % rectal cream Place 1 application rectally 2 (two) times daily. 30 g 2  . hyoscyamine (LEVSIN SL) 0.125 MG SL tablet TAKE (1) TABLET UNDER TONGUE EVERY FOUR HOURS AS NEEDED FOR CRAMPING. 30 tablet 0  . insulin aspart (NOVOLOG) 100 UNIT/ML injection INJECT S.Q. 4 TO 12 UNITS FOUR TIMES DAILY PER SLIDING SCALE. 10 mL 0  . insulin detemir (LEVEMIR) 100 UNIT/ML injection Inject 64 units bid. May titrate up to 70 units (Patient taking differently: 2 (two) times daily. Inject 64 units bid. May titrate up to 70 units) 30 mL 0  . levETIRAcetam (KEPPRA) 500 MG tablet TAKE 3 TABLETS BY MOUTH TWICE DAILY 540 tablet 0  . mesalamine (CANASA) 1000 MG suppository Place 1 suppository (1,000 mg total) rectally at bedtime. 30 suppository 1  . morphine (MS CONTIN) 30 MG 12 hr tablet Take 1 tablet (30 mg total) by mouth 2 (two) times daily. 60 tablet 0  . Multiple Vitamins-Minerals (CENTRUM PO) Take by mouth daily. Reported on 01/23/2016    . niacin (NIASPAN) 1000 MG CR tablet TAKE (2) TABLETS BY MOUTH ONCE DAILY. 180 tablet 0  . NON FORMULARY Reported on 01/23/2016    . Oxycodone HCl 10 MG TABS Take 1 tablet by mouth 4 times a day as needed for pain. Do not drive or operate machinery while on this medicine. 120 tablet 0  . pregabalin (LYRICA) 50 MG capsule Take 50 mg by mouth 2 (two) times daily.     Marland Kitchen terazosin (HYTRIN) 10 MG capsule TAKE (1) CAPSULE BY MOUTH ONCE DAILY. 90 capsule 0  . tiZANidine (ZANAFLEX) 4 MG tablet TAKE 1 TABLET THREE TIMES DAILY AS NEEDED FOR MUSCLE SPASM 90 tablet 11  . TRUEPLUS INSULIN SYRINGE 31G X 5/16" 0.5 ML MISC USE AS DIRECTED 100  each prn  . VENTOLIN HFA 108 (90 Base) MCG/ACT inhaler INHALE 2 PUFFS BY MOUTH EVERY 6 HOURS AS NEEDED FOR SHORTNESS OF BREAT H 18 each 11  . vitamin C (ASCORBIC ACID) 500 MG tablet Take 500 mg by mouth daily. Reported on 01/23/2016    . zinc gluconate 50 MG tablet Take 50 mg by mouth. Reported on 01/23/2016     No current facility-administered medications for this visit.  Physical Exam  Blood pressure (!) 175/104, pulse 100, temperature 98.1 F (36.7 C), height 5\' 7"  (1.702 m), weight 288 lb (130.6 kg).  Constitutional: overall normal hygiene, normal nutrition, well developed, normal grooming, normal body habitus. Assistive device:walker  Musculoskeletal: gait and station Limp right, muscle tone and strength are normal, no tremors or atrophy is present.  .  Neurological: coordination overall normal.  Deep tendon reflex/nerve stretch intact.  Sensation normal.  Cranial nerves II-XII intact.   Skin:   Normal overall no scars, lesions, ulcers or rashes. No psoriasis.  Psychiatric: Alert and oriented x 3.  Recent memory intact, remote memory unclear.  Normal mood and affect. Well groomed.  Good eye contact.  Cardiovascular: overall no swelling, no varicosities, no edema bilaterally, normal temperatures of the legs and arms, no clubbing, cyanosis and good capillary refill.  Lymphatic: palpation is normal.  He has a large ganglion cyst over the right radial artery on the volar side of the right wrist the size of a small grape.  It is tender but not red. NV intact.  ROM of the wrist is full.  The patient has been educated about the nature of the problem(s) and counseled on treatment options.  The patient appeared to understand what I have discussed and is in agreement with it.  Encounter Diagnoses  Name Primary?  . Ganglion cyst of wrist, right Yes  . Cigarette nicotine dependence without complication    Procedure note  After permission from the patient and sterile prep, a 18  gauge needle was used and aspirated the cyst of the right wrist by sterile technique.  About 4 cc of thick gelatinous fluid was removed.  He tolerated it well. PLAN Call if any problems.  Precautions discussed.  Continue current medications.   Return to clinic 1 month   Electronically Signed Sanjuana Kava, MD 3/22/20189:26 AM

## 2016-10-03 NOTE — Telephone Encounter (Signed)
Patient needs trilyte 

## 2016-10-07 MED ORDER — PEG 3350-KCL-NA BICARB-NACL 420 G PO SOLR: 4000.0000 mL | Freq: Once | ORAL | 0 refills | Status: AC

## 2016-10-09 ENCOUNTER — Other Ambulatory Visit: Payer: Self-pay | Admitting: *Deleted

## 2016-10-09 ENCOUNTER — Other Ambulatory Visit: Payer: Self-pay | Admitting: Family Medicine

## 2016-10-09 MED ORDER — ALPRAZOLAM 1 MG PO TABS: 1.0000 mg | ORAL_TABLET | Freq: Two times a day (BID) | ORAL | 5 refills | Status: DC | PRN

## 2016-10-09 NOTE — Telephone Encounter (Signed)
Ok six mo worth 

## 2016-10-18 NOTE — Patient Instructions (Signed)
Shaun Harris  10/18/2016     @PREFPERIOPPHARMACY @   Your procedure is scheduled on  10/25/2016   Report to [facility] at  615  A.M.  Call this number if you have problems the morning of surgery:  [phone]   Remember:  Do not eat food or drink liquids after midnight.  Take these medicines the morning of surgery with A SIP OF WATER  Xanax, apriso, wellbutrin, flexaril, vasotec, keppra, MS contin or oxycodone, lyrica and zanaflex.Take your inhalers before you come. Take 1/2 of your usual insulin dosage the night before your procedure. DO NOT take any medication for diabetes the morning of your procedure.   Do not wear jewelry, make-up or nail polish.  Do not wear lotions, powders, or perfumes, or deoderant.  Do not shave 48 hours prior to surgery.  Men may shave face and neck.  Do not bring valuables to the hospital.  [facility] is not responsible for any belongings or valuables.  Contacts, dentures or bridgework may not be worn into surgery.  Leave your suitcase in the car.  After surgery it may be brought to your room.  For patients admitted to the hospital, discharge time will be determined by your treatment team.  Patients discharged the day of surgery will not be allowed to drive home.   Name and phone number of your driver:   family Special instructions:  Follow the diet and prep instructions given to you by Dr Olevia Perches office.  Please read over the following fact sheets that you were given. Anesthesia Post-op Instructions and Care and Recovery After Surgery       Colonoscopy, Adult A colonoscopy is an exam to look at the entire large intestine. During the exam, a lubricated, bendable tube is inserted into the anus and then passed into the rectum, colon, and other parts of the large intestine. A colonoscopy is often done as a part of normal colorectal screening or in response to certain symptoms, such as anemia, persistent diarrhea, abdominal pain, and  blood in the stool. The exam can help screen for and diagnose medical problems, including:  Tumors.  Polyps.  Inflammation.  Areas of bleeding. Tell a health care provider about:  Any allergies you have.  All medicines you are taking, including vitamins, herbs, eye drops, creams, and over-the-counter medicines.  Any problems you or family members have had with anesthetic medicines.  Any blood disorders you have.  Any surgeries you have had.  Any medical conditions you have.  Any problems you have had passing stool. What are the risks? Generally, this is a safe procedure. However, problems may occur, including:  Bleeding.  A tear in the intestine.  A reaction to medicines given during the exam.  Infection (rare). What happens before the procedure? Eating and drinking restrictions  Follow instructions from your health care provider about eating and drinking, which may include:  A few days before the procedure - follow a low-fiber diet. Avoid nuts, seeds, dried fruit, raw fruits, and vegetables.  1-3 days before the procedure - follow a clear liquid diet. Drink only clear liquids, such as clear broth or bouillon, black coffee or tea, clear juice, clear soft drinks or sports drinks, gelatin dessert, and popsicles. Avoid any liquids that contain red or purple dye.  On the day of the procedure - do not eat or drink anything during the 2 hours before the procedure, or within the time period  that your health care provider recommends. Bowel prep  If you were prescribed an oral bowel prep to clean out your colon:  Take it as told by your health care provider. Starting the day before your procedure, you will need to drink a large amount of medicated liquid. The liquid will cause you to have multiple loose stools until your stool is almost clear or light green.  If your skin or anus gets irritated from diarrhea, you may use these to relieve the irritation:  Medicated wipes, such  as adult wet wipes with aloe and vitamin E.  A skin soothing-product like petroleum jelly.  If you vomit while drinking the bowel prep, take a break for up to 60 minutes and then begin the bowel prep again. If vomiting continues and you cannot take the bowel prep without vomiting, call your health care provider. General instructions   Ask your health care provider about changing or stopping your regular medicines. This is especially important if you are taking diabetes medicines or blood thinners.  Plan to have someone take you home from the hospital or clinic. What happens during the procedure?  An IV tube may be inserted into one of your veins.  You will be given medicine to help you relax (sedative).  To reduce your risk of infection:  Your health care team will wash or sanitize their hands.  Your anal area will be washed with soap.  You will be asked to lie on your side with your knees bent.  Your health care provider will lubricate a long, thin, flexible tube. The tube will have a camera and a light on the end.  The tube will be inserted into your anus.  The tube will be gently eased through your rectum and colon.  Air will be delivered into your colon to keep it open. You may feel some pressure or cramping.  The camera will be used to take images during the procedure.  A small tissue sample may be removed from your body to be examined under a microscope (biopsy). If any potential problems are found, the tissue will be sent to a lab for testing.  If small polyps are found, your health care provider may remove them and have them checked for cancer cells.  The tube that was inserted into your anus will be slowly removed. The procedure may vary among health care providers and hospitals. What happens after the procedure?  Your blood pressure, heart rate, breathing rate, and blood oxygen level will be monitored until the medicines you were given have worn off.  Do not  drive for 24 hours after the exam.  You may have a small amount of blood in your stool.  You may pass gas and have mild abdominal cramping or bloating due to the air that was used to inflate your colon during the exam.  It is up to you to get the results of your procedure. Ask your health care provider, or the department performing the procedure, when your results will be ready. This information is not intended to replace advice given to you by your health care provider. Make sure you discuss any questions you have with your health care provider. Document Released: 06/28/2000 Document Revised: 05/01/2016 Document Reviewed: 09/12/2015 Elsevier Interactive Patient Education  2017 [employer]  Colonoscopy, Adult, Care After This sheet gives you information about how to care for yourself after your procedure. Your health care provider may also give you more specific instructions. If you have problems  or questions, contact your health care provider. What can I expect after the procedure? After the procedure, it is common to have:  A small amount of blood in your stool for 24 hours after the procedure.  Some gas.  Mild abdominal cramping or bloating. Follow these instructions at home: General instructions    For the first 24 hours after the procedure:  Do not drive or use machinery.  Do not sign important documents.  Do not drink alcohol.  Do your regular daily activities at a slower pace than normal.  Eat soft, easy-to-digest foods.  Rest often.  Take over-the-counter or prescription medicines only as told by your health care provider.  It is up to you to get the results of your procedure. Ask your health care provider, or the department performing the procedure, when your results will be ready. Relieving cramping and bloating   Try walking around when you have cramps or feel bloated.  Apply heat to your abdomen as told by your health care provider. Use a heat source that  your health care provider recommends, such as a moist heat pack or a heating pad.  Place a towel between your skin and the heat source.  Leave the heat on for 20-30 minutes.  Remove the heat if your skin turns bright red. This is especially important if you are unable to feel pain, heat, or cold. You may have a greater risk of getting burned. Eating and drinking   Drink enough fluid to keep your urine clear or pale yellow.  Resume your normal diet as instructed by your health care provider. Avoid heavy or fried foods that are hard to digest.  Avoid drinking alcohol for as long as instructed by your health care provider. Contact a health care provider if:  You have blood in your stool 2-3 days after the procedure. Get help right away if:  You have more than a small spotting of blood in your stool.  You pass large blood clots in your stool.  Your abdomen is swollen.  You have nausea or vomiting.  You have a fever.  You have increasing abdominal pain that is not relieved with medicine. This information is not intended to replace advice given to you by your health care provider. Make sure you discuss any questions you have with your health care provider. Document Released: 02/13/2004 Document Revised: 03/25/2016 Document Reviewed: 09/12/2015 Elsevier Interactive Patient Education  2017 [employer] Anesthesia is a term that refers to techniques, procedures, and medicines that help a person stay safe and comfortable during a medical procedure. Monitored anesthesia care, or sedation, is one type of anesthesia. Your anesthesia specialist may recommend sedation if you will be having a procedure that does not require you to be unconscious, such as:  Cataract surgery.  A dental procedure.  A biopsy.  A colonoscopy. During the procedure, you may receive a medicine to help you relax (sedative). There are three levels of sedation:  Mild sedation. At this  level, you may feel awake and relaxed. You will be able to follow directions.  Moderate sedation. At this level, you will be sleepy. You may not remember the procedure.  Deep sedation. At this level, you will be asleep. You will not remember the procedure. The more medicine you are given, the deeper your level of sedation will be. Depending on how you respond to the procedure, the anesthesia specialist may change your level of sedation or the type of anesthesia to  fit your needs. An anesthesia specialist will monitor you closely during the procedure. Let your health care provider know about:  Any allergies you have.  All medicines you are taking, including vitamins, herbs, eye drops, creams, and over-the-counter medicines.  Any use of steroids (by mouth or as a cream).  Any problems you or family members have had with sedatives and anesthetic medicines.  Any blood disorders you have.  Any surgeries you have had.  Any medical conditions you have, such as sleep apnea.  Whether you are pregnant or may be pregnant.  Any use of cigarettes, alcohol, or street drugs. What are the risks? Generally, this is a safe procedure. However, problems may occur, including:  Getting too much medicine (oversedation).  Nausea.  Allergic reaction to medicines.  Trouble breathing. If this happens, a breathing tube may be used to help with breathing. It will be removed when you are awake and breathing on your own.  Heart trouble.  Lung trouble. Before the procedure Staying hydrated  Follow instructions from your health care provider about hydration, which may include:  Up to 2 hours before the procedure - you may continue to drink clear liquids, such as water, clear fruit juice, black coffee, and plain tea. Eating and drinking restrictions  Follow instructions from your health care provider about eating and drinking, which may include:  8 hours before the procedure - stop eating heavy meals or  foods such as meat, fried foods, or fatty foods.  6 hours before the procedure - stop eating light meals or foods, such as toast or cereal.  6 hours before the procedure - stop drinking milk or drinks that contain milk.  2 hours before the procedure - stop drinking clear liquids. Medicines  Ask your health care provider about:  Changing or stopping your regular medicines. This is especially important if you are taking diabetes medicines or blood thinners.  Taking medicines such as aspirin and ibuprofen. These medicines can thin your blood. Do not take these medicines before your procedure if your health care provider instructs you not to. Tests and exams  You will have a physical exam.  You may have blood tests done to show:  How well your kidneys and liver are working.  How well your blood can clot.  General instructions  Plan to have someone take you home from the hospital or clinic.  If you will be going home right after the procedure, plan to have someone with you for 24 hours. What happens during the procedure?  Your blood pressure, heart rate, breathing, level of pain and overall condition will be monitored.  An IV tube will be inserted into one of your veins.  Your anesthesia specialist will give you medicines as needed to keep you comfortable during the procedure. This may mean changing the level of sedation.  The procedure will be performed. After the procedure  Your blood pressure, heart rate, breathing rate, and blood oxygen level will be monitored until the medicines you were given have worn off.  Do not drive for 24 hours if you received a sedative.  You may:  Feel sleepy, clumsy, or nauseous.  Feel forgetful about what happened after the procedure.  Have a sore throat if you had a breathing tube during the procedure.  Vomit. This information is not intended to replace advice given to you by your health care provider. Make sure you discuss any questions  you have with your health care provider. Document Released: 03/27/2005 Document Revised:  12/08/2015 Document Reviewed: 10/22/2015 Elsevier Interactive Patient Education  2017 [place], Care After These instructions provide you with information about caring for yourself after your procedure. Your health care provider may also give you more specific instructions. Your treatment has been planned according to current medical practices, but problems sometimes occur. Call your health care provider if you have any problems or questions after your procedure. What can I expect after the procedure? After your procedure, it is common to:  Feel sleepy for several hours.  Feel clumsy and have poor balance for several hours.  Feel forgetful about what happened after the procedure.  Have poor judgment for several hours.  Feel nauseous or vomit.  Have a sore throat if you had a breathing tube during the procedure. Follow these instructions at home: For at least 24 hours after the procedure:    Do not:  Participate in activities in which you could fall or become injured.  Drive.  Use heavy machinery.  Drink alcohol.  Take sleeping pills or medicines that cause drowsiness.  Make important decisions or sign legal documents.  Take care of children on your own.  Rest. Eating and drinking   Follow the diet that is recommended by your health care provider.  If you vomit, drink water, juice, or soup when you can drink without vomiting.  Make sure you have little or no nausea before eating solid foods. General instructions   Have a responsible adult stay with you until you are awake and alert.  Take over-the-counter and prescription medicines only as told by your health care provider.  If you smoke, do not smoke without supervision.  Keep all follow-up visits as told by your health care provider. This is important. Contact a health care provider  if:  You keep feeling nauseous or you keep vomiting.  You feel light-headed.  You develop a rash.  You have a fever. Get help right away if:  You have trouble breathing. This information is not intended to replace advice given to you by your health care provider. Make sure you discuss any questions you have with your health care provider. Document Released: 10/22/2015 Document Revised: 02/21/2016 Document Reviewed: 10/22/2015 Elsevier Interactive Patient Education  2017 [employer].

## 2016-10-21 ENCOUNTER — Other Ambulatory Visit: Payer: Self-pay

## 2016-10-21 ENCOUNTER — Encounter (HOSPITAL_COMMUNITY)
Admission: RE | Admit: 2016-10-21 | Discharge: 2016-10-21 | Disposition: A | Discharge status: Home / Self Care | Payer: Medicare Other | Source: Ambulatory Visit | Attending: Internal Medicine | Admitting: Internal Medicine

## 2016-10-21 ENCOUNTER — Encounter (HOSPITAL_COMMUNITY): Payer: Self-pay

## 2016-10-21 DIAGNOSIS — K6289 Other specified diseases of anus and rectum: Secondary | ICD-10-CM | POA: Insufficient documentation

## 2016-10-21 DIAGNOSIS — Z01818 Encounter for other preprocedural examination: Secondary | ICD-10-CM | POA: Diagnosis not present

## 2016-10-21 DIAGNOSIS — K625 Hemorrhage of anus and rectum: Secondary | ICD-10-CM | POA: Insufficient documentation

## 2016-10-21 DIAGNOSIS — K51211 Ulcerative (chronic) proctitis with rectal bleeding: Secondary | ICD-10-CM | POA: Insufficient documentation

## 2016-10-21 LAB — BASIC METABOLIC PANEL
Anion gap: 10 (ref 5–15)
BUN: 15 mg/dL (ref 6–20)
CO2: 28 mmol/L (ref 22–32)
Calcium: 8.9 mg/dL (ref 8.9–10.3)
Chloride: 101 mmol/L (ref 101–111)
Creatinine, Ser: 0.62 mg/dL (ref 0.61–1.24)
GFR calc Af Amer: >60 mL/min (ref >60)
GFR calc non Af Amer: >60 mL/min (ref >60)
Glucose, Bld: 131 mg/dL — ABNORMAL HIGH (ref 65–99)
Potassium: 3.7 mmol/L (ref 3.5–5.1)
Sodium: 139 mmol/L (ref 135–145)

## 2016-10-21 LAB — CBC WITH DIFFERENTIAL/PLATELET
Basophils Absolute: 0 10*3/uL (ref 0.0–0.1)
Basophils Relative: 0 %
Eosinophils Absolute: 0.2 10*3/uL (ref 0.0–0.7)
Eosinophils Relative: 2 %
HCT: 42.7 % (ref 39.0–52.0)
Hemoglobin: 14.1 g/dL (ref 13.0–17.0)
Lymphocytes Relative: 24 %
Lymphs Abs: 2.2 10*3/uL (ref 0.7–4.0)
MCH: 30.7 pg (ref 26.0–34.0)
MCHC: 33 g/dL (ref 30.0–36.0)
MCV: 93 fL (ref 78.0–100.0)
Monocytes Absolute: 0.7 10*3/uL (ref 0.1–1.0)
Monocytes Relative: 8 %
Neutro Abs: 5.9 10*3/uL (ref 1.7–7.7)
Neutrophils Relative %: 66 %
Platelets: 208 10*3/uL (ref 150–400)
RBC: 4.59 MIL/uL (ref 4.22–5.81)
RDW: 12.8 % (ref 11.5–15.5)
WBC: 9 10*3/uL (ref 4.0–10.5)

## 2016-10-22 ENCOUNTER — Encounter (INDEPENDENT_AMBULATORY_CARE_PROVIDER_SITE_OTHER): Payer: Self-pay

## 2016-10-25 ENCOUNTER — Ambulatory Visit (HOSPITAL_COMMUNITY): Payer: Medicare Other | Admitting: Anesthesiology

## 2016-10-25 ENCOUNTER — Encounter (HOSPITAL_COMMUNITY)
Admission: RE | Disposition: A | Discharge status: Home / Self Care | Payer: Self-pay | Source: Ambulatory Visit | Attending: Internal Medicine

## 2016-10-25 ENCOUNTER — Encounter (HOSPITAL_COMMUNITY): Payer: Self-pay | Admitting: *Deleted

## 2016-10-25 ENCOUNTER — Ambulatory Visit (HOSPITAL_COMMUNITY)
Admission: RE | Admit: 2016-10-25 | Discharge: 2016-10-25 | Disposition: A | Discharge status: Home / Self Care | Payer: Medicare Other | Source: Ambulatory Visit | Attending: Internal Medicine | Admitting: Internal Medicine

## 2016-10-25 DIAGNOSIS — E119 Type 2 diabetes mellitus without complications: Secondary | ICD-10-CM | POA: Insufficient documentation

## 2016-10-25 DIAGNOSIS — Z833 Family history of diabetes mellitus: Secondary | ICD-10-CM | POA: Diagnosis not present

## 2016-10-25 DIAGNOSIS — K51211 Ulcerative (chronic) proctitis with rectal bleeding: Secondary | ICD-10-CM | POA: Insufficient documentation

## 2016-10-25 DIAGNOSIS — K635 Polyp of colon: Secondary | ICD-10-CM | POA: Insufficient documentation

## 2016-10-25 DIAGNOSIS — K644 Residual hemorrhoidal skin tags: Secondary | ICD-10-CM | POA: Diagnosis not present

## 2016-10-25 DIAGNOSIS — F419 Anxiety disorder, unspecified: Secondary | ICD-10-CM | POA: Insufficient documentation

## 2016-10-25 DIAGNOSIS — Z7951 Long term (current) use of inhaled steroids: Secondary | ICD-10-CM | POA: Insufficient documentation

## 2016-10-25 DIAGNOSIS — Z8249 Family history of ischemic heart disease and other diseases of the circulatory system: Secondary | ICD-10-CM | POA: Diagnosis not present

## 2016-10-25 DIAGNOSIS — J449 Chronic obstructive pulmonary disease, unspecified: Secondary | ICD-10-CM | POA: Diagnosis not present

## 2016-10-25 DIAGNOSIS — G4730 Sleep apnea, unspecified: Secondary | ICD-10-CM | POA: Diagnosis not present

## 2016-10-25 DIAGNOSIS — Z79899 Other long term (current) drug therapy: Secondary | ICD-10-CM | POA: Insufficient documentation

## 2016-10-25 DIAGNOSIS — K5100 Ulcerative (chronic) pancolitis without complications: Secondary | ICD-10-CM | POA: Insufficient documentation

## 2016-10-25 DIAGNOSIS — M1990 Unspecified osteoarthritis, unspecified site: Secondary | ICD-10-CM | POA: Diagnosis not present

## 2016-10-25 DIAGNOSIS — D125 Benign neoplasm of sigmoid colon: Secondary | ICD-10-CM | POA: Diagnosis not present

## 2016-10-25 DIAGNOSIS — Z794 Long term (current) use of insulin: Secondary | ICD-10-CM | POA: Insufficient documentation

## 2016-10-25 DIAGNOSIS — E781 Pure hyperglyceridemia: Secondary | ICD-10-CM | POA: Diagnosis not present

## 2016-10-25 DIAGNOSIS — I10 Essential (primary) hypertension: Secondary | ICD-10-CM | POA: Insufficient documentation

## 2016-10-25 DIAGNOSIS — K51911 Ulcerative colitis, unspecified with rectal bleeding: Secondary | ICD-10-CM | POA: Diagnosis not present

## 2016-10-25 DIAGNOSIS — Z888 Allergy status to other drugs, medicaments and biological substances status: Secondary | ICD-10-CM | POA: Insufficient documentation

## 2016-10-25 DIAGNOSIS — E785 Hyperlipidemia, unspecified: Secondary | ICD-10-CM | POA: Diagnosis not present

## 2016-10-25 DIAGNOSIS — Z7982 Long term (current) use of aspirin: Secondary | ICD-10-CM | POA: Insufficient documentation

## 2016-10-25 DIAGNOSIS — K921 Melena: Secondary | ICD-10-CM | POA: Diagnosis not present

## 2016-10-25 DIAGNOSIS — K625 Hemorrhage of anus and rectum: Secondary | ICD-10-CM | POA: Insufficient documentation

## 2016-10-25 DIAGNOSIS — K6289 Other specified diseases of anus and rectum: Secondary | ICD-10-CM | POA: Insufficient documentation

## 2016-10-25 HISTORY — PX: COLONOSCOPY WITH PROPOFOL: SHX5780

## 2016-10-25 HISTORY — PX: BIOPSY: SHX5522

## 2016-10-25 HISTORY — PX: POLYPECTOMY: SHX5525

## 2016-10-25 LAB — GLUCOSE, CAPILLARY
Glucose-Capillary: 227 mg/dL — ABNORMAL HIGH (ref 65–99)
Glucose-Capillary: 132 mg/dL — ABNORMAL HIGH (ref 65–99)

## 2016-10-25 SURGERY — COLONOSCOPY WITH PROPOFOL
Anesthesia: Monitor Anesthesia Care

## 2016-10-25 MED ORDER — FENTANYL CITRATE (PF) 100 MCG/2ML IJ SOLN
25.0000 ug | INTRAMUSCULAR | Status: AC
  Administered 2016-10-25 (×2): 25 ug via INTRAVENOUS

## 2016-10-25 MED ORDER — LIDOCAINE HCL (PF) 1 % IJ SOLN
INTRAMUSCULAR | Status: AC
  Filled 2016-10-25: qty 5

## 2016-10-25 MED ORDER — PROPOFOL 10 MG/ML IV BOLUS
INTRAVENOUS | Status: DC | PRN
  Administered 2016-10-25: 20 mg via INTRAVENOUS

## 2016-10-25 MED ORDER — PROPOFOL 500 MG/50ML IV EMUL
INTRAVENOUS | Status: DC | PRN
  Administered 2016-10-25: 75 ug/kg/min via INTRAVENOUS

## 2016-10-25 MED ORDER — FENTANYL CITRATE (PF) 100 MCG/2ML IJ SOLN
INTRAMUSCULAR | Status: AC
  Filled 2016-10-25: qty 2

## 2016-10-25 MED ORDER — LIDOCAINE HCL (PF) 1 % IJ SOLN
INTRAMUSCULAR | Status: AC
  Filled 2016-10-25: qty 15

## 2016-10-25 MED ORDER — MIDAZOLAM HCL 2 MG/2ML IJ SOLN
INTRAMUSCULAR | Status: AC
  Filled 2016-10-25: qty 2

## 2016-10-25 MED ORDER — BUTAMBEN-TETRACAINE-BENZOCAINE 2-2-14 % EX AERO
2.0000 | INHALATION_SPRAY | CUTANEOUS | Status: AC | PRN
  Administered 2016-10-25 (×2): 1 via TOPICAL

## 2016-10-25 MED ORDER — LACTATED RINGERS IV SOLN
INTRAVENOUS | Status: DC
  Administered 2016-10-25: 1000 mL via INTRAVENOUS

## 2016-10-25 MED ORDER — SUCCINYLCHOLINE CHLORIDE 20 MG/ML IJ SOLN
INTRAMUSCULAR | Status: AC
  Filled 2016-10-25: qty 1

## 2016-10-25 MED ORDER — PROPOFOL 10 MG/ML IV BOLUS
INTRAVENOUS | Status: AC
  Filled 2016-10-25: qty 20

## 2016-10-25 MED ORDER — GLYCOPYRROLATE 0.2 MG/ML IJ SOLN
INTRAMUSCULAR | Status: AC
  Filled 2016-10-25: qty 1

## 2016-10-25 MED ORDER — KETAMINE HCL 10 MG/ML IJ SOLN
INTRAMUSCULAR | Status: AC
  Filled 2016-10-25: qty 1

## 2016-10-25 MED ORDER — BUTAMBEN-TETRACAINE-BENZOCAINE 2-2-14 % EX AERO
INHALATION_SPRAY | CUTANEOUS | Status: AC
  Filled 2016-10-25: qty 20

## 2016-10-25 MED ORDER — CHLORHEXIDINE GLUCONATE CLOTH 2 % EX PADS: 6.0000 | MEDICATED_PAD | Freq: Once | CUTANEOUS | Status: DC

## 2016-10-25 MED ORDER — SODIUM CHLORIDE 0.9 % IJ SOLN
INTRAMUSCULAR | Status: AC
  Filled 2016-10-25: qty 10

## 2016-10-25 MED ORDER — LIDOCAINE VISCOUS 2 % MT SOLN
OROMUCOSAL | Status: AC
  Filled 2016-10-25: qty 15

## 2016-10-25 MED ORDER — SIMETHICONE 40 MG/0.6ML PO SUSP
ORAL | Status: AC
  Filled 2016-10-25: qty 30

## 2016-10-25 MED ORDER — PROPOFOL 10 MG/ML IV BOLUS
INTRAVENOUS | Status: AC
  Filled 2016-10-25: qty 60

## 2016-10-25 MED ORDER — MIDAZOLAM HCL 2 MG/2ML IJ SOLN
1.0000 mg | INTRAMUSCULAR | Status: AC
  Administered 2016-10-25: 2 mg via INTRAVENOUS

## 2016-10-25 MED ORDER — GLYCOPYRROLATE 0.2 MG/ML IJ SOLN
0.2000 mg | Freq: Once | INTRAMUSCULAR | Status: AC | PRN
  Administered 2016-10-25: 0.2 mg via INTRAVENOUS

## 2016-10-25 NOTE — Anesthesia Procedure Notes (Signed)
Procedure Name: MAC Date/Time: 10/25/2016 7:30 AM Performed by: Vista Deck Pre-anesthesia Checklist: Patient identified, Emergency Drugs available, Suction available, Timeout performed and Patient being monitored Patient Re-evaluated:Patient Re-evaluated prior to inductionOxygen Delivery Method: Non-rebreather mask

## 2016-10-25 NOTE — Anesthesia Postprocedure Evaluation (Signed)
Anesthesia Post Note  Patient: Shaun Harris  Procedure(s) Performed: Procedure(s) (LRB): COLONOSCOPY WITH PROPOFOL (N/A) POLYPECTOMY BIOPSY  Patient location during evaluation: PACU Anesthesia Type: MAC Level of consciousness: awake Pain management: satisfactory to patient Vital Signs Assessment: post-procedure vital signs reviewed and stable Respiratory status: spontaneous breathing Cardiovascular status: stable Anesthetic complications: no     Last Vitals:  Vitals:   10/25/16 0725 10/25/16 0804  BP: (!) 146/87 (!) 134/54  Pulse:  93  Resp: (!) 23 15  Temp:  36.8 C    Last Pain:  Vitals:   10/25/16 0733  TempSrc:   PainSc: 7                  Lear Carstens

## 2016-10-25 NOTE — Addendum Note (Signed)
Addendum  created 10/25/16 0818 by Vista Deck, CRNA   Anesthesia Event edited

## 2016-10-25 NOTE — H&P (Signed)
Shaun Harris is an 51 y.o. male.   Chief Complaint: Patient is here for colonoscopy. HPI: [name] is 51 year old Caucasian male who was diagnosed with ulcerative colitis in 2000 and has been maintained on oral mesalamine and now presents with 6 week history of intermittent hematochezia rectal discomfort and tenesmus. He is having 3 to formed stools per day. Most of his stools are semi-formed. He denies abdominal pain fever or chills. He has not taken NSAIDs recently. Family History is negative for IBD.  CRP on 10/02/2016 was 13.5(normal up to 8). H&H on 10/21/2016 was 14.1 and 42.7.    Past Medical History:  Diagnosis Date  . Anxiety   . Arthritis   . Cerebral vasculitis ? CVA in 1997.    Marland Kitchen Chronic headaches    constant since possible stroke in 1997,"Vasculitis of brain with increased pressure of brain ".  . COPD (chronic obstructive pulmonary disease) ([place])       . Diabetes mellitus   . Hyperlipidemia   . Hypertension   . Hypertriglyceridemia   . Sleep apnea    has but doesnt use, cannot tolerate; PCP aware  .       Marland Kitchen Tobacco abuse       . Ulcerative colitis ([place])   . Venous stasis     Past Surgical History:  Procedure Laterality Date  . CERVICAL SPINE SURGERY  2008   x2-last surgery 04/24/2016.  Marland Kitchen KNEE ARTHROSCOPY  02/25/2012   Procedure: ARTHROSCOPY KNEE;  Surgeon: Sanjuana Kava, MD;  Location: [facility];  Service: Orthopedics;  Laterality: Right;  . KNEE ARTHROSCOPY WITH MEDIAL MENISECTOMY Left 12/23/2014   Procedure: LEFT KNEE ARTHROSCOPY WITH PARTIAL MENISECTOMY;  Surgeon: Sanjuana Kava, MD;  Location: [facility];  Service: Orthopedics;  Laterality: Left;  . SHOULDER ARTHROSCOPY  2004   left shoulder  . SHOULDER ARTHROSCOPY  2008   right shoulder    Family History  Problem Relation Age of Onset  . Hypertension Father   . Diabetes Father   . Heart disease Father   . Heart disease Maternal Grandfather   . Heart attack Other   . Heart disease Brother     both brothers  have heart disease  . Hypertension Sister    Social History:  reports that he has been smoking Cigarettes.  He has a 35.00 pack-year smoking history. He has never used smokeless tobacco. He reports that he does not drink alcohol or use drugs.  Allergies:  Allergies  Allergen Reactions  . Gabapentin Other (See Comments)    Increased blood sugar    Medications Prior to Admission  Medication Sig Dispense Refill  . ADVAIR DISKUS 250-50 MCG/DOSE AEPB USE 1 INHALATION BY MOUTH EVERY 12 HOURS. <<RINSE MOUTH AFTER USE>>. 180 each 0  . Alcohol Swabs (PHARMACIST CHOICE ALCOHOL) PADS Reported on 01/23/2016    . ALPRAZolam (XANAX) 1 MG tablet Take 1 tablet (1 mg total) by mouth 2 (two) times daily as needed. (Patient taking differently: Take 1 mg by mouth 2 (two) times daily as needed for anxiety. ) 60 tablet 5  . APRISO 0.375 g 24 hr capsule TAKE (1) CAPSULE BY MOUTH FOUR TIMES DAILY. 120 capsule 5  . aspirin EC 325 MG tablet Take 325 mg by mouth every morning. Reported on 01/23/2016    . atorvastatin (LIPITOR) 20 MG tablet TAKE 1 TABLET BY MOUTH AT BEDTIME FOR CHOLESTEROL (Patient taking differently: TAKE 1 TABLET BY MOUTH DAILY IN THE MORNING FOR CHOLESTEROL) 90 tablet 0  .  buPROPion (WELLBUTRIN SR) 150 MG 12 hr tablet Take 150 mg by mouth 2 (two) times daily.     . cyclobenzaprine (FLEXERIL) 10 MG tablet Take 10 mg by mouth 3 (three) times daily as needed for muscle spasms.     . enalapril (VASOTEC) 20 MG tablet TAKE (2) TABLETS BY MOUTH ONCE DAILY. (Patient taking differently: TAKE (2) TABLETS BY MOUTH ONCE DAILY) 180 tablet 0  . furosemide (LASIX) 20 MG tablet TAKE 1 TABLET BY MOUTH ONCE DAILY. 90 tablet 0  . glipiZIDE (GLUCOTROL XL) 10 MG 24 hr tablet TAKE 2 TABLETS BY MOUTH ONCE DAILY FOR DIABETES 180 tablet 0  . hydrocortisone (ANUSOL-HC) 2.5 % rectal cream Place 1 application rectally 2 (two) times daily. 30 g 2  . hyoscyamine (LEVSIN SL) 0.125 MG SL tablet TAKE (1) TABLET UNDER TONGUE EVERY  FOUR HOURS AS NEEDED FOR CRAMPING. 30 tablet 0  . insulin detemir (LEVEMIR) 100 UNIT/ML injection Inject 64 units bid. May titrate up to 70 units (Patient taking differently: Inject 70 Units into the skin 2 (two) times daily. ) 30 mL 0  . levETIRAcetam (KEPPRA) 500 MG tablet TAKE 3 TABLETS BY MOUTH TWICE DAILY 540 tablet 0  . mesalamine (CANASA) 1000 MG suppository Place 1 suppository (1,000 mg total) rectally at bedtime. 30 suppository 1  . morphine (MS CONTIN) 30 MG 12 hr tablet Take 1 tablet (30 mg total) by mouth 2 (two) times daily. (Patient taking differently: Take 30 mg by mouth every 12 (twelve) hours. ) 60 tablet 0  . Multiple Vitamins-Minerals (CENTRUM PO) Take 1 tablet by mouth daily. Reported on 01/23/2016    . niacin (NIASPAN) 1000 MG CR tablet TAKE (2) TABLETS BY MOUTH ONCE DAILY. 180 tablet 0  . NOVOLOG 100 UNIT/ML injection INJECT S.Q. 4 TO 12 UNITS FOUR TIMES DAILY PER SLIDING SCALE. 10 mL 5  . Oxycodone HCl 10 MG TABS Take 1 tablet by mouth 4 times a day as needed for pain. Do not drive or operate machinery while on this medicine. (Patient taking differently: Take 10 mg by mouth 4 (four) times daily. ) 120 tablet 0  . pregabalin (LYRICA) 50 MG capsule Take 50 mg by mouth 2 (two) times daily.     Marland Kitchen terazosin (HYTRIN) 10 MG capsule TAKE (1) CAPSULE BY MOUTH ONCE DAILY. (Patient taking differently: TAKE (1) CAPSULE BY MOUTH ONCE IN THE EVENING) 90 capsule 0  . tiZANidine (ZANAFLEX) 4 MG tablet TAKE 1 TABLET THREE TIMES DAILY AS NEEDED FOR MUSCLE SPASM 90 tablet 11  . TRUEPLUS INSULIN SYRINGE 31G X 5/16" 0.5 ML MISC USE AS DIRECTED 100 each prn  . VENTOLIN HFA 108 (90 Base) MCG/ACT inhaler INHALE 2 PUFFS BY MOUTH EVERY 6 HOURS AS NEEDED FOR SHORTNESS OF BREAT H 18 each 5  . vitamin C (ASCORBIC ACID) 500 MG tablet Take 500 mg by mouth daily. Reported on 01/23/2016    . zinc gluconate 50 MG tablet Take 50 mg by mouth daily. Reported on 01/23/2016    . insulin aspart (NOVOLOG) 100 UNIT/ML  injection INJECT S.Q. 4 TO 12 UNITS FOUR TIMES DAILY PER SLIDING SCALE. (Patient not taking: Reported on 10/15/2016) 10 mL 0    Results for orders placed or performed during the hospital encounter of 10/25/16 (from the past 48 hour(s))  Glucose, capillary     Status: Abnormal   Collection Time: 10/25/16  7:02 AM  Result Value Ref Range   Glucose-Capillary 227 (H) 65 - 99 mg/dL   No  results found.  ROS  Blood pressure (!) 152/91, temperature 97.7 F (36.5 C), temperature source Oral, resp. rate 18, SpO2 94 %. Physical Exam  Constitutional:  Well-developed obese Caucasian male in NAD.  HENT:  Mouth/Throat: Oropharynx is clear and moist.  Eyes: Conjunctivae are normal. No scleral icterus.  Neck: No thyromegaly present.  Cardiovascular: Regular rhythm and normal heart sounds.   No murmur heard. Respiratory: Effort normal and breath sounds normal.  GI:  Abdomen is full but soft and nontender without organomegaly or masses. Small umbilicus hernia noted.  Musculoskeletal: He exhibits no edema.  Lymphadenopathy:    He has no cervical adenopathy.  Neurological: He is alert.  Skin: Skin is warm and dry.  Nonpitting pretibial edema with increased pigmentation to skin of both distal legs.     Assessment/Plan Chronic ulcerative now colitis with symptoms of relapse. Diagnostic colonoscopy under monitored anesthesia care.  Hildred Laser, MD 10/25/2016, 7:22 AM

## 2016-10-25 NOTE — Discharge Instructions (Signed)
Resume usual medications and diet. No driving for 24 hours. Physician will call with biopsy results.   Colonoscopy, Adult, Care After This sheet gives you information about how to care for yourself after your procedure. Your health care provider may also give you more specific instructions. If you have problems or questions, contact your health care provider. What can I expect after the procedure? After the procedure, it is common to have:  A small amount of blood in your stool for 24 hours after the procedure.  Some gas.  Mild abdominal cramping or bloating. Follow these instructions at home: General instructions    For the first 24 hours after the procedure:  Do not drive or use machinery.  Do not sign important documents.  Do not drink alcohol.  Do your regular daily activities at a slower pace than normal.  Eat soft, easy-to-digest foods.  Rest often.  Take over-the-counter or prescription medicines only as told by your health care provider.  It is up to you to get the results of your procedure. Ask your health care provider, or the department performing the procedure, when your results will be ready. Relieving cramping and bloating   Try walking around when you have cramps or feel bloated.  Apply heat to your abdomen as told by your health care provider. Use a heat source that your health care provider recommends, such as a moist heat pack or a heating pad.  Place a towel between your skin and the heat source.  Leave the heat on for 20-30 minutes.  Remove the heat if your skin turns bright red. This is especially important if you are unable to feel pain, heat, or cold. You may have a greater risk of getting burned. Eating and drinking   Drink enough fluid to keep your urine clear or pale yellow.  Resume your normal diet as instructed by your health care provider. Avoid heavy or fried foods that are hard to digest.  Avoid drinking alcohol for as long as  instructed by your health care provider. Contact a health care provider if:  You have blood in your stool 2-3 days after the procedure. Get help right away if:  You have more than a small spotting of blood in your stool.  You pass large blood clots in your stool.  Your abdomen is swollen.  You have nausea or vomiting.  You have a fever.  You have increasing abdominal pain that is not relieved with medicine. This information is not intended to replace advice given to you by your health care provider. Make sure you discuss any questions you have with your health care provider. Document Released: 02/13/2004 Document Revised: 03/25/2016 Document Reviewed: 09/12/2015 Elsevier Interactive Patient Education  2017 [employer].

## 2016-10-25 NOTE — Transfer of Care (Signed)
Immediate Anesthesia Transfer of Care Note  Patient: Shaun Harris  Procedure(s) Performed: Procedure(s) with comments: COLONOSCOPY WITH PROPOFOL (N/A) - 7:30 POLYPECTOMY - colon BIOPSY - colon  Patient Location: PACU  Anesthesia Type:MAC  Level of Consciousness: awake and patient cooperative  Airway & Oxygen Therapy: Patient Spontanous Breathing and non-rebreather face mask  Post-op Assessment: Report given to RN and Post -op Vital signs reviewed and stable  Post vital signs: Reviewed and stable  Last Vitals:  Vitals:   10/25/16 0720 10/25/16 0725  BP:  (!) 146/87  Resp: 20 (!) 23  Temp:      Last Pain:  Vitals:   10/25/16 0733  TempSrc:   PainSc: 7          Complications: No apparent anesthesia complications

## 2016-10-25 NOTE — Op Note (Signed)
[facility] Patient Name: Shaun Harris Procedure Date: 10/25/2016 7:08 AM MRN: [id number] Date of Birth: 1965-08-21 Attending MD: Hildred Laser , MD CSN: [id number] Age: 51 Admit Type: Outpatient Procedure:                Colonoscopy Indications:              Hematochezia, Chronic ulcerative pancolitis Providers:                Hildred Laser, MD, Otis Peak B. Sharon Seller, RN, Aram Candela Referring MD:             Grace Bushy. Luking, MD Medicines:                Propofol per Anesthesia Complications:            No immediate complications. Estimated Blood Loss:     Estimated blood loss was minimal. Procedure:                Pre-Anesthesia Assessment:                           - Prior to the procedure, a History and Physical                            was performed, and patient medications and                            allergies were reviewed. The patient's tolerance of                            previous anesthesia was also reviewed. The risks                            and benefits of the procedure and the sedation                            options and risks were discussed with the patient.                            All questions were answered, and informed consent                            was obtained. Prior Anticoagulants: The patient                            last took aspirin 1 day prior to the procedure. ASA                            Grade Assessment: III - A patient with severe                            systemic disease. After reviewing the risks and  benefits, the patient was deemed in satisfactory                            condition to undergo the procedure.                           After obtaining informed consent, the colonoscope                            was passed under direct vision. Throughout the                            procedure, the patient's blood pressure, pulse, and                            oxygen  saturations were monitored continuously. The                            [id number] ([id number]) scope was introduced through                            the and advanced to the the terminal ileum, with                            identification of the appendiceal orifice and IC                            valve. The colonoscopy was performed without                            difficulty. The patient tolerated the procedure                            well. The quality of the bowel preparation was                            good. The terminal ileum, ileocecal valve,                            appendiceal orifice, and rectum were photographed. Scope In: 7:41:03 AM Scope Out: 7:89:38 AM Scope Withdrawal Time: 0 hours 9 minutes 35 seconds  Total Procedure Duration: 0 hours 14 minutes 44 seconds  Findings:      The perianal and digital rectal examinations were normal.      The terminal ileum appeared normal.      Two sessile polyps were found in the sigmoid colon. The polyps were       small in size. These polyps were removed with a cold snare. Resection       and retrieval were complete. The pathology specimen was placed into       Bottle Number 1.      The exam was otherwise normal throughout the examined colon.      There is no endoscopic evidence of colitis in the entire colon. Biopsies       were taken with a cold forceps for histology  im rectosigmoid area.      External hemorrhoids were found during retroflexion. The hemorrhoids       were small. Biopsies were taken with a cold forceps for histology. The       pathology specimen was placed into Bottle Number 2. Impression:               - The examined portion of the ileum was normal.                           - Two small polyps in the sigmoid colon, removed                            with a cold snare. Resected and retrieved.                           - Random biopsies taken from rectosigmoid mucosa.                           - External  hemorrhoids.                           comment: UC is in remission. Moderate Sedation:      Per Anesthesia Care Recommendation:           - Patient has a contact number available for                            emergencies. The signs and symptoms of potential                            delayed complications were discussed with the                            patient. Return to normal activities tomorrow.                            Written discharge instructions were provided to the                            patient.                           - Resume previous diet today.                           - Continue present medications.                           - Await pathology results.                           - Repeat colonoscopy in 5 years for surveillance. Procedure Code(s):        --- Professional ---                           [phone], Colonoscopy, flexible; with removal of  tumor(s), polyp(s), or other lesion(s) by snare                            technique                           45380, 59, Colonoscopy, flexible; with biopsy,                            single or multiple Diagnosis Code(s):        --- Professional ---                           K64.4, Residual hemorrhoidal skin tags                           D12.5, Benign neoplasm of sigmoid colon                           K92.1, Melena (includes Hematochezia)                           K51.00, Ulcerative (chronic) pancolitis without                            complications CPT copyright 2016 American Medical Association. All rights reserved. The codes documented in this report are preliminary and upon coder review may  be revised to meet current compliance requirements. Hildred Laser, MD Hildred Laser, MD 10/25/2016 8:07:39 AM This report has been signed electronically. Number of Addenda: 0

## 2016-10-25 NOTE — Anesthesia Preprocedure Evaluation (Signed)
Anesthesia Evaluation  Patient identified by MRN, date of birth, ID band Patient awake    Reviewed: Allergy & Precautions, H&P , NPO status , Patient's Chart, lab work & pertinent test results  History of Anesthesia Complications (+) DIFFICULT AIRWAY and history of anesthetic complications (difficult intubation w/ first surgery in 2004, no problems with later surgery.)  Airway Mallampati: IV  TM Distance: <3 FB   Mouth opening: Limited Mouth Opening  Dental  (+) Partial Upper   Pulmonary sleep apnea and Continuous Positive Airway Pressure Ventilation , COPD, Current Smoker,    Pulmonary exam normal        Cardiovascular hypertension, Pt. on medications  Rhythm:Regular     Neuro/Psych  Headaches, Anxiety CVA    GI/Hepatic neg GERD  ,  Endo/Other  diabetes, Poorly Controlled, Type 2, Oral Hypoglycemic Agents, Insulin DependentMorbid obesity  Renal/GU      Musculoskeletal   Abdominal   Peds  Hematology   Anesthesia Other Findings   Reproductive/Obstetrics                             Anesthesia Physical Anesthesia Plan  ASA: III  Anesthesia Plan: MAC   Post-op Pain Management:    Induction: Intravenous  Airway Management Planned: Simple Face Mask  Additional Equipment:   Intra-op Plan:   Post-operative Plan:   Informed Consent: I have reviewed the patients History and Physical, chart, labs and discussed the procedure including the risks, benefits and alternatives for the proposed anesthesia with the patient or authorized representative who has indicated his/her understanding and acceptance.     Plan Discussed with:   Anesthesia Plan Comments:         Anesthesia Quick Evaluation

## 2016-10-31 ENCOUNTER — Other Ambulatory Visit: Payer: Self-pay | Admitting: Family Medicine

## 2016-10-31 ENCOUNTER — Encounter: Payer: Self-pay | Admitting: Orthopaedic Surgery

## 2016-10-31 ENCOUNTER — Ambulatory Visit (INDEPENDENT_AMBULATORY_CARE_PROVIDER_SITE_OTHER): Payer: Medicare Other | Admitting: Orthopaedic Surgery

## 2016-10-31 VITALS — BP 146/88 | HR 105 | Temp 98.1°F | Ht 67.0 in | Wt 290.0 lb

## 2016-10-31 DIAGNOSIS — F17210 Nicotine dependence, cigarettes, uncomplicated: Secondary | ICD-10-CM

## 2016-10-31 DIAGNOSIS — M67431 Ganglion, right wrist: Secondary | ICD-10-CM | POA: Diagnosis not present

## 2016-10-31 NOTE — Progress Notes (Signed)
Patient Shaun Harris, male DOB:[birth year], 51 y.o. Shaun Harris  Chief Complaint  Patient presents with  . Follow-up    ganglion right wrist    HPI  Shaun Harris is a 51 y.o. male who has a ganglion cyst over the right radial artery area.  I aspirated it last time.  It is still there but much smaller.  I told him when it gets bigger, it could be excised.  He will call and see Dr. Aline Brochure when it gets much larger. He would like it excised. HPI  Body mass index is 45.42 kg/m.  ROS  Review of Systems  Constitutional: Positive for fatigue.       Patient has Diabetes Mellitus. Patient has hypertension. Patient has COPD or shortness of breath. Patient has BMI > 35. Patient has current smoking history.  HENT: Negative for congestion.   Respiratory: Positive for cough and shortness of breath.   Cardiovascular: Positive for leg swelling. Negative for chest pain.  Endocrine: Positive for cold intolerance.  Musculoskeletal: Positive for arthralgias, gait problem, joint swelling and myalgias.  Allergic/Immunologic: Positive for environmental allergies.    Past Medical History:  Diagnosis Date  . Anxiety   . Arthritis   . Cerebral vasculitis   . Chronic headaches    constant since possible stroke in 1997,"Vasculitis of brain with increased pressure of brain ".  . COPD (chronic obstructive pulmonary disease) ([place])   . COPD (chronic obstructive pulmonary disease) ([place])   . Diabetes mellitus   . Hyperlipidemia   . Hypertension   . Hypertriglyceridemia   . Sleep apnea    has but doesnt use, cannot tolerate; PCP aware  . Stroke [facility])    pt states,"they arent sure if i had a stroke in 1997, there is still a question about that with my doctors". No deficits.  . Tobacco abuse   . Ulcerative colitis ([place])   . Ulcerative colitis ([place])   . Venous stasis     Past Surgical History:  Procedure Laterality Date  . CERVICAL SPINE SURGERY  2008   x2-last surgery 04/24/2016.  Marland Kitchen KNEE  ARTHROSCOPY  02/25/2012   Procedure: ARTHROSCOPY KNEE;  Surgeon: Sanjuana Kava, MD;  Location: [facility];  Service: Orthopedics;  Laterality: Right;  . KNEE ARTHROSCOPY WITH MEDIAL MENISECTOMY Left 12/23/2014   Procedure: LEFT KNEE ARTHROSCOPY WITH PARTIAL MENISECTOMY;  Surgeon: Sanjuana Kava, MD;  Location: [facility];  Service: Orthopedics;  Laterality: Left;  . SHOULDER ARTHROSCOPY  2004   left shoulder  . SHOULDER ARTHROSCOPY  2008   right shoulder    Family History  Problem Relation Age of Onset  . Hypertension Father   . Diabetes Father   . Heart disease Father   . Heart disease Maternal Grandfather   . Heart attack Other   . Heart disease Brother     both brothers have heart disease  . Hypertension Sister     Social History Social History  Substance Use Topics  . Smoking status: Current Every Day Smoker    Packs/day: 1.00    Years: 35.00    Types: Cigarettes  . Smokeless tobacco: Never Used  . Alcohol use No    Allergies  Allergen Reactions  . Gabapentin Other (See Comments)    Increased blood sugar    Current Outpatient Prescriptions  Medication Sig Dispense Refill  . ADVAIR DISKUS 250-50 MCG/DOSE AEPB USE 1 INHALATION BY MOUTH EVERY 12 HOURS. <<RINSE MOUTH AFTER USE>>. 180 each 0  . Alcohol Swabs (PHARMACIST CHOICE  ALCOHOL) PADS Reported on 01/23/2016    . ALPRAZolam (XANAX) 1 MG tablet Take 1 tablet (1 mg total) by mouth 2 (two) times daily as needed. (Patient taking differently: Take 1 mg by mouth 2 (two) times daily as needed for anxiety. ) 60 tablet 5  . APRISO 0.375 g 24 hr capsule TAKE (1) CAPSULE BY MOUTH FOUR TIMES DAILY. 120 capsule 5  . aspirin EC 325 MG tablet Take 325 mg by mouth every morning. Reported on 01/23/2016    . atorvastatin (LIPITOR) 20 MG tablet TAKE 1 TABLET BY MOUTH AT BEDTIME FOR CHOLESTEROL (Patient taking differently: TAKE 1 TABLET BY MOUTH DAILY IN THE MORNING FOR CHOLESTEROL) 90 tablet 0  . buPROPion (WELLBUTRIN SR) 150 MG 12 hr tablet Take  150 mg by mouth 2 (two) times daily.     . cyclobenzaprine (FLEXERIL) 10 MG tablet Take 10 mg by mouth 3 (three) times daily as needed for muscle spasms.     . enalapril (VASOTEC) 20 MG tablet TAKE (2) TABLETS BY MOUTH ONCE DAILY. (Patient taking differently: TAKE (2) TABLETS BY MOUTH ONCE DAILY) 180 tablet 0  . furosemide (LASIX) 20 MG tablet TAKE 1 TABLET BY MOUTH ONCE DAILY. 90 tablet 0  . glipiZIDE (GLUCOTROL XL) 10 MG 24 hr tablet TAKE 2 TABLETS BY MOUTH ONCE DAILY FOR DIABETES 180 tablet 0  . hydrocortisone (ANUSOL-HC) 2.5 % rectal cream Place 1 application rectally 2 (two) times daily. 30 g 2  . hyoscyamine (LEVSIN SL) 0.125 MG SL tablet TAKE (1) TABLET UNDER TONGUE EVERY FOUR HOURS AS NEEDED FOR CRAMPING. 30 tablet 0  . insulin detemir (LEVEMIR) 100 UNIT/ML injection Inject 64 units bid. May titrate up to 70 units (Patient taking differently: Inject 70 Units into the skin 2 (two) times daily. ) 30 mL 0  . levETIRAcetam (KEPPRA) 500 MG tablet TAKE 3 TABLETS BY MOUTH TWICE DAILY 540 tablet 0  . mesalamine (CANASA) 1000 MG suppository Place 1 suppository (1,000 mg total) rectally at bedtime. 30 suppository 1  . morphine (MS CONTIN) 30 MG 12 hr tablet Take 1 tablet (30 mg total) by mouth 2 (two) times daily. (Patient taking differently: Take 30 mg by mouth every 12 (twelve) hours. ) 60 tablet 0  . Multiple Vitamins-Minerals (CENTRUM PO) Take 1 tablet by mouth daily. Reported on 01/23/2016    . niacin (NIASPAN) 1000 MG CR tablet TAKE (2) TABLETS BY MOUTH ONCE DAILY. 180 tablet 0  . NOVOLOG 100 UNIT/ML injection INJECT S.Q. 4 TO 12 UNITS FOUR TIMES DAILY PER SLIDING SCALE. 10 mL 5  . Oxycodone HCl 10 MG TABS Take 1 tablet by mouth 4 times a day as needed for pain. Do not drive or operate machinery while on this medicine. (Patient taking differently: Take 10 mg by mouth 4 (four) times daily. ) 120 tablet 0  . pregabalin (LYRICA) 50 MG capsule Take 50 mg by mouth 2 (two) times daily.     Marland Kitchen terazosin  (HYTRIN) 10 MG capsule TAKE (1) CAPSULE BY MOUTH ONCE DAILY. (Patient taking differently: TAKE (1) CAPSULE BY MOUTH ONCE IN THE EVENING) 90 capsule 0  . tiZANidine (ZANAFLEX) 4 MG tablet TAKE 1 TABLET THREE TIMES DAILY AS NEEDED FOR MUSCLE SPASM 90 tablet 11  . TRUEPLUS INSULIN SYRINGE 31G X 5/16" 0.5 ML MISC USE AS DIRECTED 100 each prn  . VENTOLIN HFA 108 (90 Base) MCG/ACT inhaler INHALE 2 PUFFS BY MOUTH EVERY 6 HOURS AS NEEDED FOR SHORTNESS OF BREAT H 18 each 5  .  vitamin C (ASCORBIC ACID) 500 MG tablet Take 500 mg by mouth daily. Reported on 01/23/2016    . zinc gluconate 50 MG tablet Take 50 mg by mouth daily. Reported on 01/23/2016     No current facility-administered medications for this visit.      Physical Exam  Blood pressure (!) 146/88, pulse (!) 105, temperature 98.1 F (36.7 C), height 5\' 7"  (1.702 m), weight 290 lb (131.5 kg).  Constitutional: overall normal hygiene, normal nutrition, well developed, normal grooming, normal body habitus. Assistive device:walker  Musculoskeletal: gait and station Limp left, muscle tone and strength are normal, no tremors or atrophy is present.  .  Neurological: coordination overall normal.  Deep tendon reflex/nerve stretch intact.  Sensation normal.  Cranial nerves II-XII intact.   Skin:   Normal overall no scars, lesions, ulcers or rashes. No psoriasis.  Psychiatric: Alert and oriented x 3.  Recent memory intact, remote memory unclear.  Normal mood and affect. Well groomed.  Good eye contact.  Cardiovascular: overall no swelling, no varicosities, no edema bilaterally, normal temperatures of the legs and arms, no clubbing, cyanosis and good capillary refill.  Lymphatic: palpation is normal.  He has a small ganglion over the radial artery on the right.  It is significantly smaller than last time before aspiration.  NV is intact. ROM of the right wrist is full.  Grips are normal.  The patient has been educated about the nature of the  problem(s) and counseled on treatment options.  The patient appeared to understand what I have discussed and is in agreement with it.  Encounter Diagnoses  Name Primary?  . Ganglion cyst of wrist, right Yes  . Cigarette nicotine dependence without complication    He continues to smoke and is not interested in stopping. PLAN Call if any problems.  Precautions discussed.  Continue current medications.   Return to clinic to call and make appointment with Dr. Aline Brochure when cyst is much larger.   Electronically Signed Sanjuana Kava, MD 4/19/20189:08 AM

## 2016-10-31 NOTE — Patient Instructions (Signed)
Steps to Quit Smoking Smoking tobacco can be bad for your health. It can also affect almost every organ in your body. Smoking puts you and people around you at risk for many serious long-lasting (chronic) diseases. Quitting smoking is hard, but it is one of the best things that you can do for your health. It is never too late to quit. What are the benefits of quitting smoking? When you quit smoking, you lower your risk for getting serious diseases and conditions. They can include:  Lung cancer or lung disease.  Heart disease.  Stroke.  Heart attack.  Not being able to have children (infertility).  Weak bones (osteoporosis) and broken bones (fractures). If you have coughing, wheezing, and shortness of breath, those symptoms may get better when you quit. You may also get sick less often. If you are pregnant, quitting smoking can help to lower your chances of having a baby of low birth weight. What can I do to help me quit smoking? Talk with your doctor about what can help you quit smoking. Some things you can do (strategies) include:  Quitting smoking totally, instead of slowly cutting back how much you smoke over a period of time.  Going to in-person counseling. You are more likely to quit if you go to many counseling sessions.  Using resources and support systems, such as:  Online chats with a counselor.  Phone quitlines.  Printed self-help materials.  Support groups or group counseling.  Text messaging programs.  Mobile phone apps or applications.  Taking medicines. Some of these medicines may have nicotine in them. If you are pregnant or breastfeeding, do not take any medicines to quit smoking unless your doctor says it is okay. Talk with your doctor about counseling or other things that can help you. Talk with your doctor about using more than one strategy at the same time, such as taking medicines while you are also going to in-person counseling. This can help make quitting  easier. What things can I do to make it easier to quit? Quitting smoking might feel very hard at first, but there is a lot that you can do to make it easier. Take these steps:  Talk to your family and friends. Ask them to support and encourage you.  Call phone quitlines, reach out to support groups, or work with a counselor.  Ask people who smoke to not smoke around you.  Avoid places that make you want (trigger) to smoke, such as:  Bars.  Parties.  Smoke-break areas at work.  Spend time with people who do not smoke.  Lower the stress in your life. Stress can make you want to smoke. Try these things to help your stress:  Getting regular exercise.  Deep-breathing exercises.  Yoga.  Meditating.  Doing a body scan. To do this, close your eyes, focus on one area of your body at a time from head to toe, and notice which parts of your body are tense. Try to relax the muscles in those areas.  Download or buy apps on your mobile phone or tablet that can help you stick to your quit plan. There are many free apps, such as QuitGuide from the CDC (Centers for Disease Control and Prevention). You can find more support from smokefree.gov and other websites. This information is not intended to replace advice given to you by your health care provider. Make sure you discuss any questions you have with your health care provider. Document Released: 04/27/2009 Document Revised: 02/27/2016 Document   Reviewed: 11/15/2014 Elsevier Interactive Patient Education  2017 [employer] Inc.  

## 2016-11-07 ENCOUNTER — Encounter (HOSPITAL_COMMUNITY): Payer: Self-pay | Admitting: Internal Medicine

## 2016-11-11 ENCOUNTER — Telehealth: Payer: Self-pay | Admitting: Family Medicine

## 2016-11-11 NOTE — Telephone Encounter (Signed)
Left message to get more info 

## 2016-11-11 NOTE — Telephone Encounter (Signed)
Spoke with patient's wife and was informed Patient last blood sugar was over 389. Has been doing sliding scale and insulin as prescribed. Has been having stomach issues that have been followed up by Dr.Rehman but blood sugars have been running elevated since the start of abdominal issues. . Onset of high blood sugars 2 month ago. Has c/o excessive thirstiness, and "general ill feeling". Fasting blood sugars have been above 400. Please advise   [phone].

## 2016-11-11 NOTE — Telephone Encounter (Signed)
Pt's wife called stating that the pt's sugar levels have been outrageous. They have been in the high 400's to low 500's. Wife states that the pt is refusing to come see [place] or go to hospital. Wife is wanting to know what to do. Please advise.

## 2016-11-12 ENCOUNTER — Other Ambulatory Visit: Payer: Self-pay | Admitting: Family Medicine

## 2016-11-12 NOTE — Telephone Encounter (Signed)
Add ten units to ea dose of levemir, if fasting sugars not under 200 in four days, add ten more to ea dose

## 2016-11-12 NOTE — Telephone Encounter (Signed)
Spoke with patient's wife and informed her per Dr.Steve Luking- Add ten units to each dose of levemir, if fasting sugars not under 200 in four days, add ten more to each dose. Patient verbalized understanding.

## 2016-12-04 ENCOUNTER — Other Ambulatory Visit: Payer: Self-pay | Admitting: Family Medicine

## 2016-12-11 ENCOUNTER — Other Ambulatory Visit: Payer: Self-pay | Admitting: Family Medicine

## 2016-12-11 NOTE — Telephone Encounter (Signed)
Patient last seen 09/25/16

## 2016-12-26 ENCOUNTER — Ambulatory Visit (INDEPENDENT_AMBULATORY_CARE_PROVIDER_SITE_OTHER): Payer: Medicare Other | Admitting: Family Medicine

## 2016-12-26 VITALS — BP 134/80 | Ht 67.0 in | Wt 295.0 lb

## 2016-12-26 DIAGNOSIS — E785 Hyperlipidemia, unspecified: Secondary | ICD-10-CM | POA: Diagnosis not present

## 2016-12-26 DIAGNOSIS — I10 Essential (primary) hypertension: Secondary | ICD-10-CM

## 2016-12-26 DIAGNOSIS — Z79899 Other long term (current) drug therapy: Secondary | ICD-10-CM

## 2016-12-26 DIAGNOSIS — E119 Type 2 diabetes mellitus without complications: Secondary | ICD-10-CM | POA: Diagnosis not present

## 2016-12-26 DIAGNOSIS — Z79891 Long term (current) use of opiate analgesic: Secondary | ICD-10-CM | POA: Diagnosis not present

## 2016-12-26 LAB — POCT GLYCOSYLATED HEMOGLOBIN (HGB A1C): Hemoglobin A1C: 7.3

## 2016-12-26 MED ORDER — MORPHINE SULFATE ER 30 MG PO TBCR: 30.0000 mg | EXTENDED_RELEASE_TABLET | Freq: Two times a day (BID) | ORAL | 0 refills | Status: DC

## 2016-12-26 MED ORDER — OXYCODONE HCL 10 MG PO TABS: ORAL_TABLET | ORAL | 0 refills | Status: DC

## 2016-12-26 NOTE — Progress Notes (Signed)
   Subjective:    Patient ID: Shaun Harris, male    DOB: [birth year], 51 y.o.   MRN: [id number]  HPI This patient was seen today for chronic pain. Takes for headaches.   The medication list was reviewed and updated.   -Compliance with medication: yes  - Number patient states they take daily:oxycoodone four a day and morphine bid  -when was the last dose patient took? today  The patient was advised the importance of maintaining medication and not using illegal substances with these.  Refills needed: yes  The patient was educated that we can provide 3 monthly scripts for their medication, it is their responsibility to follow the instructions.  Side effects or complications from medications: none  Patient is aware that pain medications are meant to minimize the severity of the pain to allow their pain levels to improve to allow for better function. They are aware of that pain medications cannot totally remove their pain.  Due for UDT ( at least once per year) : due today  Results for orders placed or performed in visit on 12/26/16  POCT glycosylated hemoglobin (Hb A1C)  Result Value Ref Range   Hemoglobin A1C 7.3      Blood pressure medicine and blood pressure levels reviewed today with patient. Compliant with blood pressure medicine. States does not miss a dose. No obvious side effects. Blood pressure generally good when checked elsewhere. Watching salt intake.   Patient continues to take lipid medication regularly. No obvious side effects from it. Generally does not miss a dose. Prior blood work results are reviewed with patient. Patient continues to work on fat intake in diet  Patient claims compliance with diabetes medication. No obvious side effects. Reports no substantial low sugar spells. Most numbers are generally in good range when checked fasting. Generally does not miss a dose of medication. Watching diabetic diet closely  Now on 90 bid of the levemoir, overall handkling  numbers well       Review of Systems No headache, no major weight loss or weight gain, no chest pain no back pain abdominal pain no change in bowel habits complete ROS otherwise negative     Objective:   Physical Exam Alert and oriented, vitals reviewed and stable, NAD ENT-TM's and ext canals WNL bilat via otoscopic exam Soft palate, tonsils and post pharynx WNL via oropharyngeal exam Neck-symmetric, no masses; thyroid nonpalpable and nontender Pulmonary-no tachypnea or accessory muscle use; Clear without wheezes via auscultation Card--no abnrml murmurs, rhythm reg and rate WNL Carotid pulses symmetric, without bruits Substantial morbid obesity present       Assessment & Plan:  Impression type 2 diabetes diabetes good control without perfect discuss #2 COPD with asthma clinically stable encouraged to stop smoking maintain same meds 3 hypertension good control discussed maintain same meds 4 chronic pain ongoing need for meds discussed and prescribed follow-up as scheduled/appropriate blood work

## 2016-12-27 LAB — BASIC METABOLIC PANEL
BUN/Creatinine Ratio: 22 — ABNORMAL HIGH (ref 9–20)
BUN: 14 mg/dL (ref 6–24)
CO2: 24 mmol/L (ref 20–29)
Calcium: 9.4 mg/dL (ref 8.7–10.2)
Chloride: 100 mmol/L (ref 96–106)
Creatinine, Ser: 0.63 mg/dL — ABNORMAL LOW (ref 0.76–1.27)
GFR calc Af Amer: 133 mL/min/{1.73_m2} (ref >59)
GFR calc non Af Amer: 115 mL/min/{1.73_m2} (ref >59)
Glucose: 259 mg/dL — ABNORMAL HIGH (ref 65–99)
Potassium: 4.5 mmol/L (ref 3.5–5.2)
Sodium: 143 mmol/L (ref 134–144)

## 2016-12-27 LAB — HEPATIC FUNCTION PANEL
ALT: 16 IU/L (ref 0–44)
AST: 19 IU/L (ref 0–40)
Albumin: 4.8 g/dL (ref 3.5–5.5)
Alkaline Phosphatase: 100 IU/L (ref 39–117)
Bilirubin Total: <0.2 mg/dL (ref 0.0–1.2)
Bilirubin, Direct: 0.08 mg/dL (ref 0.00–0.40)
Total Protein: 6.9 g/dL (ref 6.0–8.5)

## 2016-12-27 LAB — LIPID PANEL
Chol/HDL Ratio: 3.2 ratio (ref 0.0–5.0)
Cholesterol, Total: 140 mg/dL (ref 100–199)
HDL: 44 mg/dL (ref >39)
LDL Calculated: 62 mg/dL (ref 0–99)
Triglycerides: 171 mg/dL — ABNORMAL HIGH (ref 0–149)
VLDL Cholesterol Cal: 34 mg/dL (ref 5–40)

## 2016-12-27 LAB — MICROALBUMIN / CREATININE URINE RATIO
Creatinine, Urine: 119.5 mg/dL
Microalb/Creat Ratio: 9.9 mg/g creat (ref 0.0–30.0)
Microalbumin, Urine: 11.8 ug/mL

## 2017-01-01 LAB — TOXASSURE SELECT 13 (MW), URINE

## 2017-01-05 ENCOUNTER — Encounter: Payer: Self-pay | Admitting: Family Medicine

## 2017-01-08 ENCOUNTER — Other Ambulatory Visit: Payer: Self-pay | Admitting: Family Medicine

## 2017-02-06 ENCOUNTER — Other Ambulatory Visit: Payer: Self-pay | Admitting: Family Medicine

## 2017-02-06 NOTE — Telephone Encounter (Signed)
[place] with 1 yrs worh total including refill, since I see pt faithfully every three mo for eval and controlled substance

## 2017-02-06 NOTE — Telephone Encounter (Signed)
Last seen 12/26/16

## 2017-02-11 ENCOUNTER — Other Ambulatory Visit: Payer: Self-pay

## 2017-02-11 MED ORDER — INSULIN DETEMIR 100 UNIT/ML ~~LOC~~ SOLN: SUBCUTANEOUS | 3 refills | Status: DC

## 2017-03-07 ENCOUNTER — Other Ambulatory Visit: Payer: Self-pay | Admitting: Family Medicine

## 2017-03-25 DIAGNOSIS — M4802 Spinal stenosis, cervical region: Secondary | ICD-10-CM | POA: Diagnosis not present

## 2017-03-25 DIAGNOSIS — M4722 Other spondylosis with radiculopathy, cervical region: Secondary | ICD-10-CM | POA: Diagnosis not present

## 2017-03-25 DIAGNOSIS — M5412 Radiculopathy, cervical region: Secondary | ICD-10-CM | POA: Diagnosis not present

## 2017-03-25 DIAGNOSIS — M5030 Other cervical disc degeneration, unspecified cervical region: Secondary | ICD-10-CM | POA: Diagnosis not present

## 2017-03-26 ENCOUNTER — Telehealth: Payer: Self-pay | Admitting: Family Medicine
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
## 2017-03-27 ENCOUNTER — Other Ambulatory Visit: Payer: Self-pay | Admitting: Family Medicine

## 2017-03-27 MED ORDER — MORPHINE SULFATE ER 30 MG PO TBCR: 30.0000 mg | EXTENDED_RELEASE_TABLET | Freq: Two times a day (BID) | ORAL | 0 refills | Status: DC

## 2017-03-27 MED ORDER — OXYCODONE HCL 10 MG PO TABS: ORAL_TABLET | ORAL | 0 refills | Status: DC

## 2017-03-27 NOTE — Telephone Encounter (Signed)
Can do one mo if they want to delay for one mo their o v

## 2017-03-27 NOTE — Telephone Encounter (Signed)
Spoke with patient's wife and patient's wife stated that they would like to go ahead and get a refill and delay the appointment 1 month. Per Dr.Steve Luking prescription printed.

## 2017-03-28 ENCOUNTER — Ambulatory Visit: Payer: Medicare Other | Admitting: Family Medicine

## 2017-03-28 ENCOUNTER — Other Ambulatory Visit: Payer: Self-pay | Admitting: *Deleted

## 2017-03-28 MED ORDER — ALPRAZOLAM 1 MG PO TABS: 1.0000 mg | ORAL_TABLET | Freq: Two times a day (BID) | ORAL | 5 refills | Status: DC | PRN

## 2017-03-28 NOTE — Telephone Encounter (Signed)
Ok plus five mo ref my name

## 2017-04-05 DIAGNOSIS — E119 Type 2 diabetes mellitus without complications: Secondary | ICD-10-CM | POA: Diagnosis not present

## 2017-04-05 DIAGNOSIS — Z794 Long term (current) use of insulin: Secondary | ICD-10-CM | POA: Diagnosis not present

## 2017-04-05 DIAGNOSIS — H25813 Combined forms of age-related cataract, bilateral: Secondary | ICD-10-CM | POA: Diagnosis not present

## 2017-04-07 ENCOUNTER — Ambulatory Visit (INDEPENDENT_AMBULATORY_CARE_PROVIDER_SITE_OTHER): Payer: Medicare Other | Admitting: Family Medicine

## 2017-04-07 ENCOUNTER — Encounter: Payer: Self-pay | Admitting: Family Medicine

## 2017-04-07 VITALS — BP 150/98 | Ht 67.0 in | Wt 288.0 lb

## 2017-04-07 DIAGNOSIS — E785 Hyperlipidemia, unspecified: Secondary | ICD-10-CM

## 2017-04-07 DIAGNOSIS — E119 Type 2 diabetes mellitus without complications: Secondary | ICD-10-CM | POA: Diagnosis not present

## 2017-04-07 DIAGNOSIS — Z23 Encounter for immunization: Secondary | ICD-10-CM

## 2017-04-07 DIAGNOSIS — I10 Essential (primary) hypertension: Secondary | ICD-10-CM | POA: Diagnosis not present

## 2017-04-07 LAB — POCT GLYCOSYLATED HEMOGLOBIN (HGB A1C): Hemoglobin A1C: 7.2

## 2017-04-07 MED ORDER — NAPROXEN 500 MG PO TABS: 500.0000 mg | ORAL_TABLET | Freq: Two times a day (BID) | ORAL | 0 refills | Status: DC

## 2017-04-07 MED ORDER — OXYCODONE HCL 10 MG PO TABS: ORAL_TABLET | ORAL | 0 refills | Status: DC

## 2017-04-07 MED ORDER — MORPHINE SULFATE ER 30 MG PO TBCR: 30.0000 mg | EXTENDED_RELEASE_TABLET | Freq: Two times a day (BID) | ORAL | 0 refills | Status: DC

## 2017-04-07 NOTE — Progress Notes (Signed)
   Subjective:    Patient ID: Shaun Harris, male    DOB: 04/10/1966, 51 y.o.   MRN: [id number]  HPI This patient was seen today for chronic pain. Takes for neck pain and headaches  The medication list was reviewed and updated.   -Compliance with medication: yes  - Number patient states they take daily: morphine 2 a day and oxycodone 4 a day  -when was the last dose patient took? today  The patient was advised the importance of maintaining medication and not using illegal substances with these.  Refills needed: yes  The patient was educated that we can provide 3 monthly scripts for their medication, it is their responsibility to follow the instructions.  Side effects or complications from medications: none  Patient is aware that pain medications are meant to minimize the severity of the pain to allow their pain levels to improve to allow for better function. They are aware of that pain medications cannot totally remove their pain.  Due for UDT ( at least once per year) : last one June 2018  Results for orders placed or performed in visit on 04/07/17  POCT glycosylated hemoglobin (Hb A1C)  Result Value Ref Range   Hemoglobin A1C 7.2    pt states sugars are between 300 and 500.   Blood pressure medicine and blood pressure levels reviewed today with patient. Compliant with blood pressure medicine. States does not miss a dose. No obvious side effects. Blood pressure generally good when checked elsewhere. Watching salt intake.  Patient experiencing substantial perirectal pain and coccyx pain. He describes them as 2 distinct pains. He has seen his gastroenterologist. Indeed had a colonoscopy which revealed no evidence of any proctitis states he is having horrible pain he feels that it is in his sphincter region. But then he notes it's both in the rectum and in the area right above   Review of Systems No headache, no major weight loss or weight gain, no chest pain no back pain  abdominal pain no change in bowel habits complete ROS otherwise negative     Objective:   Physical Exam Alert and oriented, vitals reviewed and stable, NAD ENT-TM's and ext canals WNL bilat via otoscopic exam Soft palate, tonsils and post pharynx WNL via oropharyngeal exam Neck-symmetric, no masses; thyroid nonpalpable and nontender Pulmonary-no tachypnea or accessory muscle use; Clear without wheezes via auscultation Card--no abnrml murmurs, rhythm reg and rate WNL Carotid pulses symmetric, without bruits Rectal exam hypersensitivity to rectal exam. No obvious inflammation noted on visual inspection. Some coccyx tenderness to deep palpation       Assessment & Plan:  Impression 1 coccyx pain/rectal pain. Etiology unclear. May well need to get back to his gastric neurologist. Would like to trial of some type of anesthetic suppository to see if it helps  #2 chronic pain discussed with ongoing need for meds meds reviewed  #3 type 2 diabetes. A1c good. Sugars elevated discussed  #4 hypertension ongoing blood pressure initially up improved on repeat.  Medications refilled follow-up as scheduled. Plus interventions as noted above. Also flu shot today naproxen sodium twice a day for coccyx discomfort

## 2017-04-30 ENCOUNTER — Telehealth: Payer: Self-pay | Admitting: Family Medicine

## 2017-04-30 MED ORDER — BLOOD GLUCOSE MONITORING SUPPL W/DEVICE KIT: PACK | 5 refills | Status: DC

## 2017-04-30 NOTE — Telephone Encounter (Signed)
Left message return call (prescription sent into pharmacy)

## 2017-04-30 NOTE — Telephone Encounter (Signed)
Wife called to check on this pt has no way of checking his sugar and is suppose to check it four times a day. Please advise.

## 2017-04-30 NOTE — Telephone Encounter (Addendum)
Requesting Rx for diabetic meter called in today since his is now broke.  Do not put specific brand so that pharmacy can check what insurance will cover.  [employer]

## 2017-05-01 NOTE — Telephone Encounter (Signed)
I called and left a vm.Asked that they r/c.

## 2017-05-02 ENCOUNTER — Other Ambulatory Visit: Payer: Self-pay | Admitting: Family Medicine

## 2017-05-02 NOTE — Telephone Encounter (Signed)
Last seen 04/07/17

## 2017-05-06 DIAGNOSIS — G4733 Obstructive sleep apnea (adult) (pediatric): Secondary | ICD-10-CM | POA: Diagnosis not present

## 2017-05-06 DIAGNOSIS — M170 Bilateral primary osteoarthritis of knee: Secondary | ICD-10-CM | POA: Diagnosis not present

## 2017-05-06 DIAGNOSIS — E119 Type 2 diabetes mellitus without complications: Secondary | ICD-10-CM | POA: Diagnosis not present

## 2017-05-06 DIAGNOSIS — Z794 Long term (current) use of insulin: Secondary | ICD-10-CM | POA: Diagnosis not present

## 2017-05-13 DIAGNOSIS — M170 Bilateral primary osteoarthritis of knee: Secondary | ICD-10-CM | POA: Diagnosis not present

## 2017-05-13 DIAGNOSIS — Z794 Long term (current) use of insulin: Secondary | ICD-10-CM | POA: Diagnosis not present

## 2017-05-13 DIAGNOSIS — G4733 Obstructive sleep apnea (adult) (pediatric): Secondary | ICD-10-CM | POA: Diagnosis not present

## 2017-05-13 DIAGNOSIS — E119 Type 2 diabetes mellitus without complications: Secondary | ICD-10-CM | POA: Diagnosis not present

## 2017-06-02 ENCOUNTER — Other Ambulatory Visit: Payer: Self-pay | Admitting: Family Medicine

## 2017-06-03 DIAGNOSIS — G4733 Obstructive sleep apnea (adult) (pediatric): Secondary | ICD-10-CM | POA: Diagnosis not present

## 2017-06-03 DIAGNOSIS — E119 Type 2 diabetes mellitus without complications: Secondary | ICD-10-CM | POA: Diagnosis not present

## 2017-06-03 DIAGNOSIS — R635 Abnormal weight gain: Secondary | ICD-10-CM | POA: Diagnosis not present

## 2017-06-03 DIAGNOSIS — Z6841 Body Mass Index (BMI) 40.0 and over, adult: Secondary | ICD-10-CM | POA: Diagnosis not present

## 2017-06-03 DIAGNOSIS — M170 Bilateral primary osteoarthritis of knee: Secondary | ICD-10-CM | POA: Diagnosis not present

## 2017-06-03 DIAGNOSIS — Z794 Long term (current) use of insulin: Secondary | ICD-10-CM | POA: Diagnosis not present

## 2017-07-01 ENCOUNTER — Other Ambulatory Visit: Payer: Self-pay | Admitting: Family Medicine

## 2017-07-01 ENCOUNTER — Ambulatory Visit (INDEPENDENT_AMBULATORY_CARE_PROVIDER_SITE_OTHER): Payer: Medicare Other | Admitting: Family Medicine

## 2017-07-01 ENCOUNTER — Encounter: Payer: Self-pay | Admitting: Family Medicine

## 2017-07-01 DIAGNOSIS — E785 Hyperlipidemia, unspecified: Secondary | ICD-10-CM

## 2017-07-01 DIAGNOSIS — E119 Type 2 diabetes mellitus without complications: Secondary | ICD-10-CM

## 2017-07-01 DIAGNOSIS — I10 Essential (primary) hypertension: Secondary | ICD-10-CM

## 2017-07-01 DIAGNOSIS — Z79899 Other long term (current) drug therapy: Secondary | ICD-10-CM | POA: Diagnosis not present

## 2017-07-01 DIAGNOSIS — Z125 Encounter for screening for malignant neoplasm of prostate: Secondary | ICD-10-CM

## 2017-07-01 LAB — POCT GLYCOSYLATED HEMOGLOBIN (HGB A1C): Hemoglobin A1C: 6.3

## 2017-07-01 MED ORDER — MORPHINE SULFATE ER 30 MG PO TBCR: 30.0000 mg | EXTENDED_RELEASE_TABLET | Freq: Two times a day (BID) | ORAL | 0 refills | Status: DC

## 2017-07-01 MED ORDER — OXYCODONE HCL 10 MG PO TABS: ORAL_TABLET | ORAL | 0 refills | Status: DC

## 2017-07-01 NOTE — Progress Notes (Signed)
   Subjective:    Patient ID: Shaun Harris, male    DOB: 18-Dec-1965, 51 y.o.   MRN: [id number]  HPI This patient was seen today for chronic pain. Takes for head and neck pain.   The medication list was reviewed and updated.   -Compliance with medication: yes  - Number patient states they take daily: 4 a day  -when was the last dose patient took? today  The patient was advised the importance of maintaining medication and not using illegal substances with these.  Refills needed: yes  The patient was educated that we can provide 3 monthly scripts for their medication, it is their responsibility to follow the instructions.  Side effects or complications from medications: none  Patient is aware that pain medications are meant to minimize the severity of the pain to allow their pain levels to improve to allow for better function. They are aware of that pain medications cannot totally remove their pain.  Due for UDT ( at least once per year) : last one in June 2018  Results for orders placed or performed in visit on 07/01/17  POCT glycosylated hemoglobin (Hb A1C)  Result Value Ref Range   Hemoglobin A1C 6.3      Down to sixty on the levemir   Blood pressure medicine and blood pressure levels reviewed today with patient. Compliant with blood pressure medicine. States does not miss a dose. No obvious side effects. Blood pressure generally good when checked elsewhere. Watching salt intake.     Review of Systems No headache, no major weight loss or weight gain, no chest pain no back pain abdominal pain no change in bowel habits complete ROS otherwise negative     Objective:   Physical Exam  Alert and oriented, vitals reviewed and stable, NAD ENT-TM's and ext canals WNL bilat via otoscopic exam Soft palate, tonsils and post pharynx WNL via oropharyngeal exam Neck-symmetric, no masses; thyroid nonpalpable and nontender Pulmonary-no tachypnea or accessory muscle use; Clear  without wheezes via auscultation Card--no abnrml murmurs, rhythm reg and rate WNL Carotid pulses symmetric, without brui   Impression 1 type 2 diabetes.  Good control.  Discussed.  Maintain same approach  2.  Hypertension good control discussed to maintain same meds  3.  Asthma ongoing.  Encouraged to stop smoking.  4.  Chronic pain discussed  Impression: Chronic pain. Patient compliant with medication. No substantial side effects. [place] controlled substance registry reviewed to ensure compliance and proper use of medication. Patient aware goal of medicine is not complete resolution of pain but to control his symptoms to improve his functional capacity. Aware of potential adverse side effects   Follow-up as scheduled      Assessment & Plan:

## 2017-07-02 ENCOUNTER — Encounter: Payer: Self-pay | Admitting: Family Medicine

## 2017-07-02 LAB — HEPATIC FUNCTION PANEL
ALT: 12 IU/L (ref 0–44)
AST: 12 IU/L (ref 0–40)
Albumin: 4.7 g/dL (ref 3.5–5.5)
Alkaline Phosphatase: 94 IU/L (ref 39–117)
Bilirubin Total: 0.2 mg/dL (ref 0.0–1.2)
Bilirubin, Direct: 0.06 mg/dL (ref 0.00–0.40)
Total Protein: 7.1 g/dL (ref 6.0–8.5)

## 2017-07-02 LAB — LIPID PANEL
Chol/HDL Ratio: 4 ratio (ref 0.0–5.0)
Cholesterol, Total: 139 mg/dL (ref 100–199)
HDL: 35 mg/dL — ABNORMAL LOW (ref >39)
LDL Calculated: 87 mg/dL (ref 0–99)
Triglycerides: 87 mg/dL (ref 0–149)
VLDL Cholesterol Cal: 17 mg/dL (ref 5–40)

## 2017-07-02 LAB — PSA: Prostate Specific Ag, Serum: 1.4 ng/mL (ref 0.0–4.0)

## 2017-07-07 ENCOUNTER — Ambulatory Visit: Payer: Medicare Other | Admitting: Family Medicine

## 2017-07-12 DIAGNOSIS — G4733 Obstructive sleep apnea (adult) (pediatric): Secondary | ICD-10-CM | POA: Diagnosis not present

## 2017-07-16 DIAGNOSIS — J449 Chronic obstructive pulmonary disease, unspecified: Secondary | ICD-10-CM | POA: Diagnosis not present

## 2017-07-17 ENCOUNTER — Other Ambulatory Visit: Payer: Self-pay | Admitting: Family Medicine

## 2017-07-22 DIAGNOSIS — Z6841 Body Mass Index (BMI) 40.0 and over, adult: Secondary | ICD-10-CM | POA: Diagnosis not present

## 2017-07-22 DIAGNOSIS — M170 Bilateral primary osteoarthritis of knee: Secondary | ICD-10-CM | POA: Diagnosis not present

## 2017-07-22 DIAGNOSIS — Z794 Long term (current) use of insulin: Secondary | ICD-10-CM | POA: Diagnosis not present

## 2017-07-22 DIAGNOSIS — R635 Abnormal weight gain: Secondary | ICD-10-CM | POA: Diagnosis not present

## 2017-07-22 DIAGNOSIS — G4733 Obstructive sleep apnea (adult) (pediatric): Secondary | ICD-10-CM | POA: Diagnosis not present

## 2017-07-22 DIAGNOSIS — E119 Type 2 diabetes mellitus without complications: Secondary | ICD-10-CM | POA: Diagnosis not present

## 2017-08-01 ENCOUNTER — Telehealth: Payer: Self-pay | Admitting: Family Medicine

## 2017-08-01 ENCOUNTER — Other Ambulatory Visit: Payer: Self-pay | Admitting: Family Medicine

## 2017-08-01 MED ORDER — NAPROXEN 500 MG PO TABS: 500.0000 mg | ORAL_TABLET | Freq: Two times a day (BID) | ORAL | 5 refills | Status: DC

## 2017-08-01 NOTE — Telephone Encounter (Signed)
Prescription sent electronically to pharmacy. Patient notified. 

## 2017-08-01 NOTE — Telephone Encounter (Signed)
[place] with five ref

## 2017-08-01 NOTE — Telephone Encounter (Signed)
Patients pharmacy sent over a refill requesting for Naproxen.  She just wanted to let Dr. Richardson Landry know that his bottom has flared up again and this is why they need the refill.

## 2017-08-30 ENCOUNTER — Other Ambulatory Visit: Payer: Self-pay | Admitting: Family Medicine

## 2017-09-02 DIAGNOSIS — G4733 Obstructive sleep apnea (adult) (pediatric): Secondary | ICD-10-CM | POA: Diagnosis not present

## 2017-09-02 DIAGNOSIS — Z794 Long term (current) use of insulin: Secondary | ICD-10-CM | POA: Diagnosis not present

## 2017-09-02 DIAGNOSIS — R635 Abnormal weight gain: Secondary | ICD-10-CM | POA: Diagnosis not present

## 2017-09-02 DIAGNOSIS — Z6841 Body Mass Index (BMI) 40.0 and over, adult: Secondary | ICD-10-CM | POA: Diagnosis not present

## 2017-09-02 DIAGNOSIS — M170 Bilateral primary osteoarthritis of knee: Secondary | ICD-10-CM | POA: Diagnosis not present

## 2017-09-02 DIAGNOSIS — E119 Type 2 diabetes mellitus without complications: Secondary | ICD-10-CM | POA: Diagnosis not present

## 2017-09-27 ENCOUNTER — Other Ambulatory Visit: Payer: Self-pay | Admitting: Family Medicine

## 2017-09-30 ENCOUNTER — Encounter: Payer: Self-pay | Admitting: Family Medicine

## 2017-09-30 ENCOUNTER — Ambulatory Visit (INDEPENDENT_AMBULATORY_CARE_PROVIDER_SITE_OTHER): Payer: Medicare Other | Admitting: Family Medicine

## 2017-09-30 ENCOUNTER — Other Ambulatory Visit: Payer: Self-pay | Admitting: *Deleted

## 2017-09-30 VITALS — BP 130/82 | Ht 67.0 in | Wt 284.0 lb

## 2017-09-30 DIAGNOSIS — G894 Chronic pain syndrome: Secondary | ICD-10-CM

## 2017-09-30 DIAGNOSIS — J441 Chronic obstructive pulmonary disease with (acute) exacerbation: Secondary | ICD-10-CM | POA: Diagnosis not present

## 2017-09-30 DIAGNOSIS — I10 Essential (primary) hypertension: Secondary | ICD-10-CM | POA: Diagnosis not present

## 2017-09-30 DIAGNOSIS — E119 Type 2 diabetes mellitus without complications: Secondary | ICD-10-CM

## 2017-09-30 LAB — POCT GLYCOSYLATED HEMOGLOBIN (HGB A1C): Hemoglobin A1C: 7.3

## 2017-09-30 MED ORDER — OXYCODONE HCL 10 MG PO TABS: ORAL_TABLET | ORAL | 0 refills | Status: DC

## 2017-09-30 MED ORDER — MORPHINE SULFATE ER 30 MG PO TBCR: 30.0000 mg | EXTENDED_RELEASE_TABLET | Freq: Two times a day (BID) | ORAL | 0 refills | Status: DC

## 2017-09-30 MED ORDER — AMOXICILLIN-POT CLAVULANATE 875-125 MG PO TABS: 1.0000 | ORAL_TABLET | Freq: Two times a day (BID) | ORAL | 0 refills | Status: DC

## 2017-09-30 MED ORDER — ALPRAZOLAM 1 MG PO TABS: 1.0000 mg | ORAL_TABLET | Freq: Two times a day (BID) | ORAL | 5 refills | Status: DC | PRN

## 2017-09-30 NOTE — Progress Notes (Signed)
   Subjective:    Patient ID: Shaun Harris, male    DOB: 1966/07/03, 52 y.o.   MRN: [id number]  HPI Diabetes : He states he eats healthy and does not exercise much.He sees an eye Dr.Pulmonary Dr.  SUBJECTIVE:This patient was seen today for chronic pain  The medication list was reviewed and updated.   -Compliance with medication:Yes  - Number patient states they take daily: Oxycodone 4 per day Morphine takes 2 daily  -when was the last dose patient took? This am   The patient was advised the importance of maintaining medication and not using illegal substances with these.  Here for refills and follow up  The patient was educated that we can provide 3 monthly scripts for their medication, it is their responsibility to follow the instructions.  Side effects or complications from medications: No  Patient is aware that pain medications are meant to minimize the severity of the pain to allow their pain levels to improve to allow for better function. They are aware of that pain medications cannot totally remove their pain.  Due for UDT ( at least once per year) : 12/2017  Patient claims compliance with diabetes medication. No obvious side effects. Reports no substantial low sugar spells. Most numbers are generally in good range when checked fasting. Generally does not miss a dose of medication. Watching diabetic diet closely  Blood pressure medicine and blood pressure levels reviewed today with patient. Compliant with blood pressure medicine. States does not miss a dose. No obvious side effects. Blood pressure generally good when checked elsewhere. Watching salt intake.   [place] numbers up at times, sometimes   300 sometimes all the way don  whezing a bit more layely   Results for orders placed or performed in visit on 09/30/17  POCT glycosylated hemoglobin (Hb A1C)  Result Value Ref Range   Hemoglobin A1C 7.3      Review of Systems No headache, no major weight loss or weight  gain, no chest pain no back pain abdominal pain no change in bowel habits complete ROS otherwise negative     Objective:   Physical Exam  Alert and oriented, vitals reviewed and stable, NAD ENT-TM's and ext canals WNL bilat via otoscopic exam Soft palate, tonsils and post pharynx WNL via oropharyngeal exam Neck-symmetric, no masses; thyroid nonpalpable and nontender Pulmonary-no tachypnea or accessory muscle use; Clear without wheezes via auscultation Card--no abnrml murmurs, rhythm reg and rate WNL Carotid pulses symmetric, without bruits       Assessment & Plan:  Impression 1 diabetes good control discussed to maintain same approach compliance discussed  2.  Hypertension ongoing.  Good control discussed to maintain same meds  3 chronic pain ongoing with need for meds meds definitely help his function.  Impression: Chronic pain. Patient compliant with medication. No substantial side effects. [place] controlled substance registry reviewed to ensure compliance and proper use of medication. Patient aware goal of medicine is not complete resolution of pain but to control his symptoms to improve his functional capacity. Aware of potential adverse side effects  #4 chronic asthma.  Clinically stable.  Unfortunately patient still smoking encouraged to stop

## 2017-10-01 ENCOUNTER — Ambulatory Visit: Payer: Medicare Other | Admitting: Family Medicine

## 2017-10-07 DIAGNOSIS — Z6841 Body Mass Index (BMI) 40.0 and over, adult: Secondary | ICD-10-CM | POA: Diagnosis not present

## 2017-10-07 DIAGNOSIS — R635 Abnormal weight gain: Secondary | ICD-10-CM | POA: Diagnosis not present

## 2017-10-07 DIAGNOSIS — G4733 Obstructive sleep apnea (adult) (pediatric): Secondary | ICD-10-CM | POA: Diagnosis not present

## 2017-10-07 DIAGNOSIS — Z794 Long term (current) use of insulin: Secondary | ICD-10-CM | POA: Diagnosis not present

## 2017-10-07 DIAGNOSIS — E119 Type 2 diabetes mellitus without complications: Secondary | ICD-10-CM | POA: Diagnosis not present

## 2017-10-07 DIAGNOSIS — M170 Bilateral primary osteoarthritis of knee: Secondary | ICD-10-CM | POA: Diagnosis not present

## 2017-10-13 DIAGNOSIS — G4733 Obstructive sleep apnea (adult) (pediatric): Secondary | ICD-10-CM | POA: Diagnosis not present

## 2017-10-21 ENCOUNTER — Other Ambulatory Visit: Payer: Self-pay | Admitting: Family Medicine

## 2017-10-29 ENCOUNTER — Other Ambulatory Visit: Payer: Self-pay | Admitting: Family Medicine

## 2017-10-29 NOTE — Telephone Encounter (Signed)
Ok six mo worth 

## 2017-10-29 NOTE — Telephone Encounter (Signed)
See prior rec from earlier tod, six mo worth all

## 2017-11-11 DIAGNOSIS — Z794 Long term (current) use of insulin: Secondary | ICD-10-CM | POA: Diagnosis not present

## 2017-11-11 DIAGNOSIS — M170 Bilateral primary osteoarthritis of knee: Secondary | ICD-10-CM | POA: Diagnosis not present

## 2017-11-11 DIAGNOSIS — Z6841 Body Mass Index (BMI) 40.0 and over, adult: Secondary | ICD-10-CM | POA: Diagnosis not present

## 2017-11-11 DIAGNOSIS — E119 Type 2 diabetes mellitus without complications: Secondary | ICD-10-CM | POA: Diagnosis not present

## 2017-11-11 DIAGNOSIS — R635 Abnormal weight gain: Secondary | ICD-10-CM | POA: Diagnosis not present

## 2017-11-11 DIAGNOSIS — G4733 Obstructive sleep apnea (adult) (pediatric): Secondary | ICD-10-CM | POA: Diagnosis not present

## 2017-11-20 ENCOUNTER — Other Ambulatory Visit: Payer: Self-pay | Admitting: Family Medicine

## 2017-11-20 NOTE — Telephone Encounter (Signed)
Patient called to request a 30 day supply on this.

## 2017-11-28 ENCOUNTER — Other Ambulatory Visit: Payer: Self-pay | Admitting: Family Medicine

## 2017-12-05 ENCOUNTER — Other Ambulatory Visit: Payer: Self-pay | Admitting: Family Medicine

## 2017-12-19 ENCOUNTER — Other Ambulatory Visit: Payer: Self-pay | Admitting: Nurse Practitioner

## 2017-12-29 ENCOUNTER — Other Ambulatory Visit: Payer: Self-pay | Admitting: Family Medicine

## 2018-01-01 ENCOUNTER — Telehealth: Payer: Self-pay | Admitting: *Deleted

## 2018-01-01 ENCOUNTER — Encounter: Payer: Self-pay | Admitting: Family Medicine

## 2018-01-01 ENCOUNTER — Ambulatory Visit (INDEPENDENT_AMBULATORY_CARE_PROVIDER_SITE_OTHER): Payer: Medicare Other | Admitting: Family Medicine

## 2018-01-01 ENCOUNTER — Other Ambulatory Visit: Payer: Self-pay | Admitting: *Deleted

## 2018-01-01 VITALS — Ht 67.0 in

## 2018-01-01 DIAGNOSIS — E785 Hyperlipidemia, unspecified: Secondary | ICD-10-CM | POA: Diagnosis not present

## 2018-01-01 DIAGNOSIS — E119 Type 2 diabetes mellitus without complications: Secondary | ICD-10-CM | POA: Diagnosis not present

## 2018-01-01 DIAGNOSIS — I10 Essential (primary) hypertension: Secondary | ICD-10-CM

## 2018-01-01 DIAGNOSIS — G894 Chronic pain syndrome: Secondary | ICD-10-CM | POA: Diagnosis not present

## 2018-01-01 DIAGNOSIS — Z79899 Other long term (current) drug therapy: Secondary | ICD-10-CM | POA: Diagnosis not present

## 2018-01-01 LAB — POCT GLYCOSYLATED HEMOGLOBIN (HGB A1C): Hemoglobin A1C: 7.3 % — AB (ref 4.0–5.6)

## 2018-01-01 MED ORDER — OXYCODONE HCL 10 MG PO TABS: ORAL_TABLET | ORAL | 0 refills | Status: DC

## 2018-01-01 MED ORDER — FUROSEMIDE 20 MG PO TABS: 20.0000 mg | ORAL_TABLET | Freq: Every day | ORAL | 1 refills | Status: DC

## 2018-01-01 MED ORDER — MORPHINE SULFATE ER 30 MG PO TBCR: 30.0000 mg | EXTENDED_RELEASE_TABLET | Freq: Two times a day (BID) | ORAL | 0 refills | Status: DC

## 2018-01-01 MED ORDER — NAPROXEN 500 MG PO TABS: ORAL_TABLET | ORAL | 5 refills | Status: DC

## 2018-01-01 NOTE — Telephone Encounter (Signed)
Ok 30 d worth with 6 ref

## 2018-01-01 NOTE — Telephone Encounter (Signed)
Refills sent

## 2018-01-01 NOTE — Progress Notes (Signed)
Subjective:    Patient ID: Shaun Harris, male    DOB: December 18, 1965, 52 y.o.   MRN: [id number]  HPI This patient was seen today for chronic pain. Takes for head pain and knee pain  The medication list was reviewed and updated.   -Compliance with medication: yes  - Number patient states they take daily: oxycodone 4 daily, morphine 2 daily  -when was the last dose patient took? yesterday  The patient was advised the importance of maintaining medication and not using illegal substances with these.  Here for refills and follow up  The patient was educated that we can provide 3 monthly scripts for their medication, it is their responsibility to follow the instructions.  Side effects or complications from medications: none  Patient is aware that pain medications are meant to minimize the severity of the pain to allow their pain levels to improve to allow for better function. They are aware of that pain medications cannot totally remove their pain.  Due for UDT ( at least once per year) : due today  Patient is also here today to follow up on chronic illnesses.  Results for orders placed or performed in visit on 01/01/18  POCT glycosylated hemoglobin (Hb A1C)  Result Value Ref Range   Hemoglobin A1C 7.3 (A) 4.0 - 5.6 %   HbA1c, POC (prediabetic range)  5.7 - 6.4 %   HbA1c, POC (controlled diabetic range)  0.0 - 7.0 %    Pt now on ozempic and metformin from the [facility]  Blood pressure medicine and blood pressure levels reviewed today with patient. Compliant with blood pressure medicine. States does not miss a dose. No obvious side effects. Blood pressure generally good when checked elsewhere. Watching salt intake.   Patient continues to take lipid medication regularly. No obvious side effects from it. Generally does not miss a dose. Prior blood work results are reviewed with patient. Patient continues to work on fat intake in diet Patient claims compliance with diabetes  medication. No obvious side effects. Reports no substantial low sugar spells. Most numbers are generally in good range when checked fasting. Generally does not miss a dose of medication. Watching diabetic diet closely   Patient claims compliance with diabetes medication. No obvious side effects. Reports no substantial low sugar spells. Most numbers are generally in good range when checked fasting. Generally does not miss a dose of medication. Watching diabetic diet closely   Review of Systems No headache, no major weight loss or weight gain, no chest pain no back pain abdominal pain no change in bowel habits complete ROS otherwise negative     Objective:   Physical Exam   Alert and oriented, vitals reviewed and stable, NAD ENT-TM's and ext canals WNL bilat via otoscopic exam Soft palate, tonsils and post pharynx WNL via oropharyngeal exam Neck-symmetric, no masses; thyroid nonpalpable and nontender Pulmonary-no tachypnea or accessory muscle use; Clear without wheezes via auscultation Card--no abnrml murmurs, rhythm reg and rate WNL Carotid pulses symmetric, without bruits      Assessment & Plan:  Impression: Chronic pain. Patient compliant with medication. No substantial side effects. [place] controlled substance registry reviewed to ensure compliance and proper use of medication. Patient aware goal of medicine is not complete resolution of pain but to control his symptoms to improve his functional capacity. Aware of potential adverse side effects  #2 type 2 diabetes.  Good control.  Discussed.  To maintain same therapy.  3.  Hypertension.  Good control discussed to maintain same approach.  4.  Hyperlipidemia.  Prior blood work reviewed.  Compliance discussed maintain same medicine  5.  Chronic asthma.  Fair control encouraged once again to stop smoking

## 2018-01-01 NOTE — Telephone Encounter (Signed)
Pt seen today. Asked for a refill on naproxen. States he only gets a 15 day supply but would like to get a 30 day supply.  [place] Pt does not need a call back unless dr Richardson Landry denies refill.

## 2018-01-02 ENCOUNTER — Other Ambulatory Visit: Payer: Self-pay | Admitting: Family Medicine

## 2018-01-02 LAB — BASIC METABOLIC PANEL
BUN/Creatinine Ratio: 14 (ref 9–20)
BUN: 13 mg/dL (ref 6–24)
CO2: 24 mmol/L (ref 20–29)
Calcium: 9.6 mg/dL (ref 8.7–10.2)
Chloride: 101 mmol/L (ref 96–106)
Creatinine, Ser: 0.94 mg/dL (ref 0.76–1.27)
GFR calc Af Amer: 108 mL/min/{1.73_m2} (ref >59)
GFR calc non Af Amer: 93 mL/min/{1.73_m2} (ref >59)
Glucose: 197 mg/dL — ABNORMAL HIGH (ref 65–99)
Potassium: 4.5 mmol/L (ref 3.5–5.2)
Sodium: 141 mmol/L (ref 134–144)

## 2018-01-02 LAB — MICROALBUMIN / CREATININE URINE RATIO
Creatinine, Urine: 178.9 mg/dL
Microalb/Creat Ratio: 21 mg/g creat (ref 0.0–30.0)
Microalbumin, Urine: 37.5 ug/mL

## 2018-01-02 LAB — LIPID PANEL
Chol/HDL Ratio: 3.4 ratio (ref 0.0–5.0)
Cholesterol, Total: 132 mg/dL (ref 100–199)
HDL: 39 mg/dL — ABNORMAL LOW (ref >39)
LDL Calculated: 55 mg/dL (ref 0–99)
Triglycerides: 191 mg/dL — ABNORMAL HIGH (ref 0–149)
VLDL Cholesterol Cal: 38 mg/dL (ref 5–40)

## 2018-01-02 LAB — HEPATIC FUNCTION PANEL
ALT: 11 IU/L (ref 0–44)
AST: 11 IU/L (ref 0–40)
Albumin: 5 g/dL (ref 3.5–5.5)
Alkaline Phosphatase: 79 IU/L (ref 39–117)
Bilirubin Total: 0.3 mg/dL (ref 0.0–1.2)
Bilirubin, Direct: 0.11 mg/dL (ref 0.00–0.40)
Total Protein: 7 g/dL (ref 6.0–8.5)

## 2018-01-06 LAB — SPECIMEN STATUS REPORT

## 2018-01-06 LAB — TOXASSURE SELECT 13 (MW), URINE

## 2018-01-13 DIAGNOSIS — G4733 Obstructive sleep apnea (adult) (pediatric): Secondary | ICD-10-CM | POA: Diagnosis not present

## 2018-01-13 DIAGNOSIS — J449 Chronic obstructive pulmonary disease, unspecified: Secondary | ICD-10-CM | POA: Diagnosis not present

## 2018-01-13 DIAGNOSIS — F17210 Nicotine dependence, cigarettes, uncomplicated: Secondary | ICD-10-CM | POA: Diagnosis not present

## 2018-01-13 DIAGNOSIS — Z9989 Dependence on other enabling machines and devices: Secondary | ICD-10-CM | POA: Diagnosis not present

## 2018-01-28 ENCOUNTER — Other Ambulatory Visit: Payer: Self-pay | Admitting: Family Medicine

## 2018-02-24 DIAGNOSIS — G4733 Obstructive sleep apnea (adult) (pediatric): Secondary | ICD-10-CM | POA: Diagnosis not present

## 2018-02-24 DIAGNOSIS — Z6841 Body Mass Index (BMI) 40.0 and over, adult: Secondary | ICD-10-CM | POA: Diagnosis not present

## 2018-02-24 DIAGNOSIS — E119 Type 2 diabetes mellitus without complications: Secondary | ICD-10-CM | POA: Diagnosis not present

## 2018-02-24 DIAGNOSIS — M170 Bilateral primary osteoarthritis of knee: Secondary | ICD-10-CM | POA: Diagnosis not present

## 2018-02-24 DIAGNOSIS — Z794 Long term (current) use of insulin: Secondary | ICD-10-CM | POA: Diagnosis not present

## 2018-02-27 ENCOUNTER — Other Ambulatory Visit: Payer: Self-pay | Admitting: Family Medicine

## 2018-03-30 ENCOUNTER — Encounter: Payer: Self-pay | Admitting: Family Medicine

## 2018-03-30 ENCOUNTER — Other Ambulatory Visit: Payer: Self-pay | Admitting: Family Medicine

## 2018-03-30 ENCOUNTER — Ambulatory Visit (INDEPENDENT_AMBULATORY_CARE_PROVIDER_SITE_OTHER): Payer: Medicare Other | Admitting: Family Medicine

## 2018-03-30 VITALS — BP 132/80 | Ht 67.0 in | Wt 269.6 lb

## 2018-03-30 DIAGNOSIS — Z23 Encounter for immunization: Secondary | ICD-10-CM

## 2018-03-30 DIAGNOSIS — J441 Chronic obstructive pulmonary disease with (acute) exacerbation: Secondary | ICD-10-CM | POA: Diagnosis not present

## 2018-03-30 DIAGNOSIS — I10 Essential (primary) hypertension: Secondary | ICD-10-CM

## 2018-03-30 DIAGNOSIS — E119 Type 2 diabetes mellitus without complications: Secondary | ICD-10-CM | POA: Diagnosis not present

## 2018-03-30 DIAGNOSIS — G894 Chronic pain syndrome: Secondary | ICD-10-CM

## 2018-03-30 MED ORDER — MORPHINE SULFATE ER 30 MG PO TBCR: 30.0000 mg | EXTENDED_RELEASE_TABLET | Freq: Two times a day (BID) | ORAL | 0 refills | Status: DC

## 2018-03-30 MED ORDER — OXYCODONE HCL 10 MG PO TABS: ORAL_TABLET | ORAL | 0 refills | Status: DC

## 2018-03-30 NOTE — Progress Notes (Signed)
Subjective:    Patient ID: Shaun Harris, male    DOB: 23-Oct-1965, 52 y.o.   MRN: [id number]  HPI This patient was seen today for chronic pain  The medication list was reviewed and updated.   -Compliance with medication: yes  - Number patient states they take daily: 4  -when was the last dose patient took? About 12:30 pm today  The patient was advised the importance of maintaining medication and not using illegal substances with these.  Here for refills and follow up  The patient was educated that we can provide 3 monthly scripts for their medication, it is their responsibility to follow the instructions.  Side effects or complications from medications: none  Patient is aware that pain medications are meant to minimize the severity of the pain to allow their pain levels to improve to allow for better function. They are aware of that pain medications cannot totally remove their pain.  Due for UDT ( at least once per year) :last one done 01/01/18  Results for orders placed or performed in visit on 01/01/18  ToxASSURE Select 13 (MW), Urine  Result Value Ref Range   Summary FINAL   Lipid panel  Result Value Ref Range   Cholesterol, Total 132 100 - 199 mg/dL   Triglycerides 191 (H) 0 - 149 mg/dL   HDL 39 (L) >39 mg/dL   VLDL Cholesterol Cal 38 5 - 40 mg/dL   LDL Calculated 55 0 - 99 mg/dL   Chol/HDL Ratio 3.4 0.0 - 5.0 ratio  Hepatic function panel  Result Value Ref Range   Total Protein 7.0 6.0 - 8.5 g/dL   Albumin 5.0 3.5 - 5.5 g/dL   Bilirubin Total 0.3 0.0 - 1.2 mg/dL   Bilirubin, Direct 0.11 0.00 - 0.40 mg/dL   Alkaline Phosphatase 79 39 - 117 IU/L   AST 11 0 - 40 IU/L   ALT 11 0 - 44 IU/L  Basic metabolic panel  Result Value Ref Range   Glucose 197 (H) 65 - 99 mg/dL   BUN 13 6 - 24 mg/dL   Creatinine, Ser 0.94 0.76 - 1.27 mg/dL   GFR calc non Af Amer 93 >59 mL/min/1.73   GFR calc Af Amer 108 >59 mL/min/1.73   BUN/Creatinine Ratio 14 9 - 20   Sodium 141 134 -  144 mmol/L   Potassium 4.5 3.5 - 5.2 mmol/L   Chloride 101 96 - 106 mmol/L   CO2 24 20 - 29 mmol/L   Calcium 9.6 8.7 - 10.2 mg/dL  Microalbumin / creatinine urine ratio  Result Value Ref Range   Creatinine, Urine 178.9 Not Estab. mg/dL   Microalbumin, Urine 37.5 Not Estab. ug/mL   Microalb/Creat Ratio 21.0 0.0 - 30.0 mg/g creat  Specimen status report  Result Value Ref Range   specimen status report Comment   POCT glycosylated hemoglobin (Hb A1C)  Result Value Ref Range   Hemoglobin A1C 7.3 (A) 4.0 - 5.6 %   HbA1c, POC (prediabetic range)  5.7 - 6.4 %   HbA1c, POC (controlled diabetic range)  0.0 - 7.0 %   Blood pressure medicine and blood pressure levels reviewed today with patient. Compliant with blood pressure medicine. States does not miss a dose. No obvious side effects. Blood pressure generally good when checked elsewhere. Watching salt intake.   Patient claims compliance with diabetes medication. No obvious side effects. Reports no substantial low sugar spells. Most numbers are generally in good range when checked fasting. Generally  does not miss a dose of medication. Watching diabetic diet closely  astma ok but not great    Patient compliant with pain medication. Continues to experience the pain which led to initiation of analgesic intervention. No significant negative side effects. States definitely needs the pain medication to maintain current level of functioning. Does not receive controlled substance pain medication elsewhere.      Review of Systems No headache, no major weight loss or weight gain, no chest pain no back pain abdominal pain no change in bowel habits complete ROS otherwise negative     Objective:   Physical Exam   Alert and oriented, vitals reviewed and stable, NAD ENT-TM's and ext canals WNL bilat via otoscopic exam Soft palate, tonsils and post pharynx WNL via oropharyngeal exam Neck-symmetric, no masses; thyroid nonpalpable and  nontender Pulmonary-no tachypnea or accessory muscle use; Clear without wheezes via auscultation Card--no abnrml murmurs, rhythm reg and rate WNL Carotid pulses symmetric, without bruits      Assessment & Plan:  1.pd  Impression: Chronic pain. Patient compliant with medication. No substantial side effects. [place] controlled substance registry reviewed to ensure compliance and proper use of medication. Patient aware goal of medicine is not complete resolution of pain but to control his symptoms to improve his functional capacity. Aware of potential adverse side effects  #2 hypertension.  Good control.  Discussed to maintain same meds compliance discussed  3.  Type 2 diabetes currently stable to maintain same follow-up as scheduled with A1c  4.  Asthma clinically stable unfortunately still smoking encouraged to stop  Follow-up in 3 months diet exercise discussed medications refilled flu shot today

## 2018-03-31 ENCOUNTER — Telehealth: Payer: Self-pay | Admitting: Family Medicine

## 2018-03-31 ENCOUNTER — Other Ambulatory Visit: Payer: Self-pay | Admitting: *Deleted

## 2018-03-31 MED ORDER — ALPRAZOLAM 1 MG PO TABS: 1.0000 mg | ORAL_TABLET | Freq: Two times a day (BID) | ORAL | 5 refills | Status: DC | PRN

## 2018-03-31 MED ORDER — INSULIN DETEMIR 100 UNIT/ML ~~LOC~~ SOLN: SUBCUTANEOUS | 5 refills | Status: DC

## 2018-03-31 NOTE — Telephone Encounter (Signed)
Script printed. Await dr signature then fax and call pt

## 2018-03-31 NOTE — Telephone Encounter (Signed)
[facility] in [place]  Patient was here yesterday but did not get refills on Metformin 500 mg, Levemir 100, and Xanax.  Wife said pharmacy said they sent over a request yesterday and today.

## 2018-03-31 NOTE — Telephone Encounter (Signed)
Pt just needs xanax and levemir. Seen yesterday. Ok to refill?

## 2018-03-31 NOTE — Telephone Encounter (Signed)
Yes six mo worth 

## 2018-03-31 NOTE — Telephone Encounter (Signed)
meds sent to pharm and pt notified.  

## 2018-04-02 ENCOUNTER — Ambulatory Visit: Payer: Medicare Other | Admitting: Family Medicine

## 2018-04-14 DIAGNOSIS — Z6841 Body Mass Index (BMI) 40.0 and over, adult: Secondary | ICD-10-CM | POA: Diagnosis not present

## 2018-04-14 DIAGNOSIS — M170 Bilateral primary osteoarthritis of knee: Secondary | ICD-10-CM | POA: Diagnosis not present

## 2018-04-14 DIAGNOSIS — E119 Type 2 diabetes mellitus without complications: Secondary | ICD-10-CM | POA: Diagnosis not present

## 2018-04-14 DIAGNOSIS — G4733 Obstructive sleep apnea (adult) (pediatric): Secondary | ICD-10-CM | POA: Diagnosis not present

## 2018-04-14 DIAGNOSIS — Z794 Long term (current) use of insulin: Secondary | ICD-10-CM | POA: Diagnosis not present

## 2018-04-22 ENCOUNTER — Other Ambulatory Visit: Payer: Self-pay | Admitting: Family Medicine

## 2018-04-30 ENCOUNTER — Other Ambulatory Visit: Payer: Self-pay | Admitting: Family Medicine

## 2018-05-04 ENCOUNTER — Other Ambulatory Visit: Payer: Self-pay | Admitting: Family Medicine

## 2018-05-05 NOTE — Telephone Encounter (Signed)
Six mo worth 

## 2018-05-12 ENCOUNTER — Other Ambulatory Visit: Payer: Self-pay | Admitting: Family Medicine

## 2018-05-12 MED ORDER — TIZANIDINE HCL 4 MG PO TABS: ORAL_TABLET | ORAL | 5 refills | Status: DC

## 2018-05-12 MED ORDER — ALBUTEROL SULFATE HFA 108 (90 BASE) MCG/ACT IN AERS: INHALATION_SPRAY | RESPIRATORY_TRACT | 5 refills | Status: DC

## 2018-05-12 NOTE — Telephone Encounter (Signed)
Prescriptions sent electronically to pharmacy. Patient notified. °

## 2018-05-12 NOTE — Telephone Encounter (Signed)
Ok plus 5 ref 

## 2018-05-12 NOTE — Telephone Encounter (Signed)
Pt's wife (Amy Jeschke on [place]) calling in checking on status of refills on tiZANidine (ZANAFLEX) 4 MG tablet and albuterol (PROAIR HFA) 108 (90 BASE) MCG/ACT inhaler. Please send to [facility] [place].

## 2018-05-29 ENCOUNTER — Other Ambulatory Visit: Payer: Self-pay | Admitting: Family Medicine

## 2018-06-17 DIAGNOSIS — E119 Type 2 diabetes mellitus without complications: Secondary | ICD-10-CM | POA: Diagnosis not present

## 2018-06-17 DIAGNOSIS — Z794 Long term (current) use of insulin: Secondary | ICD-10-CM | POA: Diagnosis not present

## 2018-06-17 DIAGNOSIS — R635 Abnormal weight gain: Secondary | ICD-10-CM | POA: Diagnosis not present

## 2018-07-02 ENCOUNTER — Encounter: Payer: Self-pay | Admitting: Family Medicine

## 2018-07-02 ENCOUNTER — Ambulatory Visit (INDEPENDENT_AMBULATORY_CARE_PROVIDER_SITE_OTHER): Payer: Medicare Other | Admitting: Family Medicine

## 2018-07-02 VITALS — BP 138/90 | Ht 66.0 in | Wt 259.0 lb

## 2018-07-02 DIAGNOSIS — Z13220 Encounter for screening for lipoid disorders: Secondary | ICD-10-CM

## 2018-07-02 DIAGNOSIS — J441 Chronic obstructive pulmonary disease with (acute) exacerbation: Secondary | ICD-10-CM | POA: Diagnosis not present

## 2018-07-02 DIAGNOSIS — Z0000 Encounter for general adult medical examination without abnormal findings: Secondary | ICD-10-CM | POA: Diagnosis not present

## 2018-07-02 DIAGNOSIS — G894 Chronic pain syndrome: Secondary | ICD-10-CM

## 2018-07-02 DIAGNOSIS — E781 Pure hyperglyceridemia: Secondary | ICD-10-CM | POA: Diagnosis not present

## 2018-07-02 DIAGNOSIS — I10 Essential (primary) hypertension: Secondary | ICD-10-CM

## 2018-07-02 DIAGNOSIS — Z23 Encounter for immunization: Secondary | ICD-10-CM | POA: Diagnosis not present

## 2018-07-02 DIAGNOSIS — E119 Type 2 diabetes mellitus without complications: Secondary | ICD-10-CM | POA: Diagnosis not present

## 2018-07-02 DIAGNOSIS — Z125 Encounter for screening for malignant neoplasm of prostate: Secondary | ICD-10-CM | POA: Diagnosis not present

## 2018-07-02 LAB — POCT GLYCOSYLATED HEMOGLOBIN (HGB A1C): Hemoglobin A1C: 5.6 % (ref 4.0–5.6)

## 2018-07-02 MED ORDER — MORPHINE SULFATE ER 30 MG PO TBCR: 30.0000 mg | EXTENDED_RELEASE_TABLET | Freq: Two times a day (BID) | ORAL | 0 refills | Status: DC

## 2018-07-02 MED ORDER — OXYCODONE HCL 10 MG PO TABS: ORAL_TABLET | ORAL | 0 refills | Status: DC

## 2018-07-02 MED ORDER — INSULIN DETEMIR 100 UNIT/ML ~~LOC~~ SOLN: SUBCUTANEOUS | 5 refills | Status: DC

## 2018-07-02 NOTE — Progress Notes (Signed)
Subjective:    Patient ID: Shaun Harris, male    DOB: May 14, 1966, 52 y.o.   MRN: [id number]  HPI The patient comes in today for a wellness visit.    A review of their health history was completed.  A review of medications was also completed.  Any needed refills; oxycodone and morphine  Eating habits: health conscious  Falls/  MVA accidents in past few months: none  Regular exercise: none  Specialist pt sees on regular basis: dr Vicente Males - dietician, pulmonary doctor at [facility] issues were discussed.   Additional concerns: none  Results for orders placed or performed in visit on 07/02/18  POCT glycosylated hemoglobin (Hb A1C)  Result Value Ref Range   Hemoglobin A1C 5.6 4.0 - 5.6 %   HbA1c POC (<> result, manual entry)     HbA1c, POC (prediabetic range)     HbA1c, POC (controlled diabetic range)      Patient claims compliance with diabetes medication. No obvious side effects. Reports no substantial low sugar spells. Most numbers are generally in good range when checked fasting. Generally does not miss a dose of medication. Watching diabetic diet closely  Impression: Chronic pain. Patient compliant with medication. No substantial side effects. [place] controlled substance registry reviewed to ensure compliance and proper use of medication. Patient aware goal of medicine is not complete resolution of pain but to control his symptoms to improve his functional capacity. Aware of potential adverse side effects  Blood pressure medicine and blood pressure levels reviewed today with patient. Compliant with blood pressure medicine. States does not miss a dose. No obvious side effects. Blood pressure generally good when checked elsewhere. Watching salt intake.   Patient continues to take lipid medication regularly. No obvious side effects from it. Generally does not miss a dose. Prior blood work results are reviewed with patient. Patient continues to work on  fat intake in diet  Chronic headaches ongoing   Review of Systems  Constitutional: Negative for activity change, appetite change and fever.  HENT: Negative for congestion and rhinorrhea.   Eyes: Negative for discharge.  Respiratory: Negative for cough and wheezing.   Cardiovascular: Negative for chest pain.  Gastrointestinal: Negative for abdominal pain, blood in stool and vomiting.  Genitourinary: Negative for difficulty urinating and frequency.  Musculoskeletal: Negative for neck pain.  Skin: Negative for rash.  Allergic/Immunologic: Negative for environmental allergies and food allergies.  Neurological: Negative for weakness and headaches.  Psychiatric/Behavioral: Negative for agitation.  All other systems reviewed and are negative.      Objective:   Physical Exam Vitals signs reviewed.  Constitutional:      Appearance: He is well-developed.     Comments: So substantial obesity present BMI 41  HENT:     Head: Normocephalic and atraumatic.     Right Ear: External ear normal.     Left Ear: External ear normal.     Nose: Nose normal.  Eyes:     Pupils: Pupils are equal, round, and reactive to light.  Neck:     Musculoskeletal: Normal range of motion and neck supple.     Thyroid: No thyromegaly.  Cardiovascular:     Rate and Rhythm: Normal rate and regular rhythm.     Heart sounds: Normal heart sounds. No murmur.  Pulmonary:     Effort: Pulmonary effort is normal. No respiratory distress.     Breath sounds: Normal breath sounds. No wheezing.  Abdominal:     General:  Bowel sounds are normal. There is no distension.     Palpations: Abdomen is soft. There is no mass.     Tenderness: There is no abdominal tenderness.  Genitourinary:    Penis: Normal.   Musculoskeletal: Normal range of motion.  Lymphadenopathy:     Cervical: No cervical adenopathy.  Skin:    General: Skin is warm and dry.     Findings: No erythema.  Neurological:     Mental Status: He is alert.      Motor: No abnormal muscle tone.  Psychiatric:        Behavior: Behavior normal.        Judgment: Judgment normal.           Assessment & Plan:  Impression.  Wellness. Colonoscop, nexxt due 2024.  Diet discussed.  Exercise discussed.  Vaccines discussed and recommended  2.  Hypertension.  Good control discussed to maintain same meds  3.  Type 2 diabetes.  Tight control discussed maintain same meds diet discussed  4.  Asthma/COPD.  Clinically stable.  In need of refill on nebulizer will proceed.  5.  Chronic pain Impression: Chronic pain. Patient compliant with medication. No substantial side effects. [place] controlled substance registry reviewed to ensure compliance and proper use of medication. Patient aware goal of medicine is not complete resolution of pain but to control his symptoms to improve his functional capacity. Aware of potential adverse side effects  Pd  Follow-up in 3 months.  Orders as noted above

## 2018-07-03 LAB — PSA: Prostate Specific Ag, Serum: 1.7 ng/mL (ref 0.0–4.0)

## 2018-07-03 LAB — LIPID PANEL
Chol/HDL Ratio: 2.8 ratio (ref 0.0–5.0)
Cholesterol, Total: 125 mg/dL (ref 100–199)
HDL: 44 mg/dL (ref >39)
LDL Calculated: 64 mg/dL (ref 0–99)
Triglycerides: 85 mg/dL (ref 0–149)
VLDL Cholesterol Cal: 17 mg/dL (ref 5–40)

## 2018-07-03 LAB — HEPATIC FUNCTION PANEL
ALT: 13 IU/L (ref 0–44)
AST: 14 IU/L (ref 0–40)
Albumin: 4.8 g/dL (ref 3.5–5.5)
Alkaline Phosphatase: 76 IU/L (ref 39–117)
Bilirubin Total: 0.2 mg/dL (ref 0.0–1.2)
Bilirubin, Direct: 0.09 mg/dL (ref 0.00–0.40)
Total Protein: 6.7 g/dL (ref 6.0–8.5)

## 2018-07-12 ENCOUNTER — Encounter: Payer: Self-pay | Admitting: Family Medicine

## 2018-07-28 ENCOUNTER — Other Ambulatory Visit: Payer: Self-pay | Admitting: Family Medicine

## 2018-07-30 ENCOUNTER — Telehealth: Payer: Self-pay | Admitting: *Deleted

## 2018-07-30 NOTE — Telephone Encounter (Signed)
Received letter from patient's insurance stating that Shaun Harris is not on formulary and has certain quality limits if approved. Please advise

## 2018-07-30 NOTE — Telephone Encounter (Signed)
Are alternatives provided?

## 2018-07-30 NOTE — Telephone Encounter (Signed)
Alternatives include sulfasalazine tab, balsalazid disodium cap, mesalamine cap, 400 mg dr (generic Delzicol)

## 2018-08-28 ENCOUNTER — Other Ambulatory Visit: Payer: Self-pay | Admitting: Family Medicine

## 2018-08-28 NOTE — Telephone Encounter (Signed)
[facility] [employer]  Requesting Alprazolam 1 mg one po bid prn. # 60

## 2018-09-01 ENCOUNTER — Other Ambulatory Visit: Payer: Self-pay | Admitting: Family Medicine

## 2018-09-24 ENCOUNTER — Telehealth: Payer: Self-pay | Admitting: Family Medicine

## 2018-09-24 NOTE — Telephone Encounter (Signed)
Requesting refill for:  albuterol (VENTOLIN HFA) 108 (90 Base) MCG/ACT inhaler   ALPRAZolam (XANAX) 1 MG tablet   naproxen (NAPROSYN) 500 MG tablet   NOVOLOG 100 UNIT/ML injection   TRUEPLUS INSULIN SYRINGE 31G X 5/16" 0.5 ML MISC   Pharmacy:  [place], [place]

## 2018-09-24 NOTE — Telephone Encounter (Signed)
Last med check up dec 2019

## 2018-09-25 MED ORDER — ALPRAZOLAM 1 MG PO TABS: 1.0000 mg | ORAL_TABLET | Freq: Two times a day (BID) | ORAL | 5 refills | Status: DC | PRN

## 2018-09-25 MED ORDER — "INSULIN SYRINGE-NEEDLE U-100 31G X 5/16"" 0.5 ML MISC": 99 refills | Status: DC

## 2018-09-25 MED ORDER — NAPROXEN 500 MG PO TABS: ORAL_TABLET | ORAL | 5 refills | Status: DC

## 2018-09-25 MED ORDER — ALBUTEROL SULFATE HFA 108 (90 BASE) MCG/ACT IN AERS: INHALATION_SPRAY | RESPIRATORY_TRACT | 5 refills | Status: DC

## 2018-09-25 MED ORDER — INSULIN ASPART 100 UNIT/ML ~~LOC~~ SOLN: SUBCUTANEOUS | 5 refills | Status: DC

## 2018-09-25 NOTE — Telephone Encounter (Signed)
Medication sent in and pt is aware  

## 2018-09-25 NOTE — Telephone Encounter (Signed)
Please advise. Thank you

## 2018-09-25 NOTE — Telephone Encounter (Signed)
Six mo worth all 

## 2018-09-25 NOTE — Telephone Encounter (Signed)
Shaun Harris calling checking on status of refills. Pt does not have enough alprazolam for the weekend.

## 2018-09-29 ENCOUNTER — Telehealth: Payer: Self-pay | Admitting: Family Medicine

## 2018-09-29 ENCOUNTER — Other Ambulatory Visit: Payer: Self-pay

## 2018-09-29 ENCOUNTER — Ambulatory Visit (INDEPENDENT_AMBULATORY_CARE_PROVIDER_SITE_OTHER): Payer: Medicare Other | Admitting: Family Medicine

## 2018-09-29 ENCOUNTER — Other Ambulatory Visit: Payer: Self-pay | Admitting: *Deleted

## 2018-09-29 DIAGNOSIS — E119 Type 2 diabetes mellitus without complications: Secondary | ICD-10-CM

## 2018-09-29 DIAGNOSIS — G894 Chronic pain syndrome: Secondary | ICD-10-CM

## 2018-09-29 DIAGNOSIS — J441 Chronic obstructive pulmonary disease with (acute) exacerbation: Secondary | ICD-10-CM

## 2018-09-29 DIAGNOSIS — I10 Essential (primary) hypertension: Secondary | ICD-10-CM | POA: Diagnosis not present

## 2018-09-29 LAB — POCT GLYCOSYLATED HEMOGLOBIN (HGB A1C): Hemoglobin A1C: 5.8 % — AB (ref 4.0–5.6)

## 2018-09-29 MED ORDER — MORPHINE SULFATE ER 30 MG PO TBCR: 30.0000 mg | EXTENDED_RELEASE_TABLET | Freq: Two times a day (BID) | ORAL | 0 refills | Status: DC

## 2018-09-29 MED ORDER — INSULIN DETEMIR 100 UNIT/ML ~~LOC~~ SOLN: SUBCUTANEOUS | 5 refills | Status: DC

## 2018-09-29 MED ORDER — OXYCODONE HCL 10 MG PO TABS: ORAL_TABLET | ORAL | 0 refills | Status: DC

## 2018-09-29 MED ORDER — FUROSEMIDE 20 MG PO TABS: 20.0000 mg | ORAL_TABLET | Freq: Every day | ORAL | 1 refills | Status: DC

## 2018-09-29 MED ORDER — TERAZOSIN HCL 10 MG PO CAPS: ORAL_CAPSULE | ORAL | 1 refills | Status: DC

## 2018-09-29 MED ORDER — FLUTICASONE-SALMETEROL 250-50 MCG/DOSE IN AEPB: INHALATION_SPRAY | RESPIRATORY_TRACT | 3 refills | Status: DC

## 2018-09-29 MED ORDER — ATORVASTATIN CALCIUM 20 MG PO TABS: ORAL_TABLET | ORAL | 1 refills | Status: DC

## 2018-09-29 MED ORDER — METFORMIN HCL 500 MG PO TABS: ORAL_TABLET | ORAL | 0 refills | Status: DC

## 2018-09-29 MED ORDER — MORPHINE SULFATE ER 30 MG PO TBCR: EXTENDED_RELEASE_TABLET | ORAL | 0 refills | Status: DC

## 2018-09-29 MED ORDER — INSULIN ASPART 100 UNIT/ML ~~LOC~~ SOLN: SUBCUTANEOUS | 5 refills | Status: DC

## 2018-09-29 MED ORDER — ALBUTEROL SULFATE HFA 108 (90 BASE) MCG/ACT IN AERS: INHALATION_SPRAY | RESPIRATORY_TRACT | 5 refills | Status: DC

## 2018-09-29 MED ORDER — "INSULIN SYRINGE-NEEDLE U-100 31G X 5/16"" 0.5 ML MISC": 99 refills | Status: DC

## 2018-09-29 MED ORDER — NIACIN ER (ANTIHYPERLIPIDEMIC) 1000 MG PO TBCR: EXTENDED_RELEASE_TABLET | ORAL | 0 refills | Status: DC

## 2018-09-29 MED ORDER — LEVETIRACETAM 500 MG PO TABS: 1500.0000 mg | ORAL_TABLET | Freq: Two times a day (BID) | ORAL | 1 refills | Status: DC

## 2018-09-29 MED ORDER — MESALAMINE ER 0.375 G PO CP24: ORAL_CAPSULE | ORAL | 5 refills | Status: DC

## 2018-09-29 MED ORDER — ENALAPRIL MALEATE 20 MG PO TABS: ORAL_TABLET | ORAL | 1 refills | Status: DC

## 2018-09-29 NOTE — Telephone Encounter (Signed)
Pt's diabetic specialist is out of office and completely out of metformin. Pharm already gave a 3 day supply and cannot give another. Takes metformin 500mg  2 qam and one at night.  [place].

## 2018-09-29 NOTE — Telephone Encounter (Signed)
Med sent to pharm.pt notified one month sent in and to follow up with diabetic specialist.

## 2018-09-29 NOTE — Telephone Encounter (Signed)
meds sent over 4 mins after they called. Pt notified.

## 2018-09-29 NOTE — Progress Notes (Signed)
   Subjective:    Patient ID: Shaun Harris, male    DOB: [birth year], 53 y.o.   MRN: [id number]  HPI Patient is here today to follow up on his chronic health issues.  Hypertension:Enalapril 20 mg one bid  Copd:Albuterol prn and advair250-50 bid  Diabetes:Levemir 40 bid,Novolog per ss,Metformin 1000 in am and 500 Qhs.  Hyperlipidemia:Lipitor 20 mg per day and Niacin 1000 mg two per day.  Blood pressure medicine and blood pressure levels reviewed today with patient. Compliant with blood pressure medicine. States does not miss a dose. No obvious side effects. Blood pressure generally good when checked elsewhere. Watching salt intake.   Patient claims compliance with diabetes medication. No obvious side effects. Reports no substantial low sugar spells. Most numbers are generally in good range when checked fasting. Generally does not miss a dose of medication. Watching diabetic diet closely   Review of Systems No headache, no major weight loss or weight gain, no chest pain no back pain abdominal pain no change in bowel habits complete ROS otherwise negative     Objective:   Physical Exam  Alert and oriented, vitals reviewed and stable, NAD ENT-TM's and ext canals WNL bilat via otoscopic exam Soft palate, tonsils and post pharynx WNL via oropharyngeal exam Neck-symmetric, no masses; thyroid nonpalpable and nontender Pulmonary-no tachypnea or accessory muscle use; Clear without wheezes via auscultation Card--no abnrml murmurs, rhythm reg and rate WNL Carotid pulses symmetric, without bruits       Assessment & Plan:  Impression type 2 diabetes good control discussed maintain  2.  Asthma clinically stable discussed maintain same  3.  Hypertension good control discussed maintain same  4.  Chronic pain.  Ongoing need for meds  Patient compliant with pain medication. Continues to experience the pain which led to initiation of analgesic intervention. No significant negative side  effects. States definitely needs the pain medication to maintain current level of functioning. Does not receive controlled substance pain medication elsewhere.  Greater than 50% of this 25 minute face to face visit was spent in counseling and discussion and coordination of care regarding the above diagnosis/diagnosies

## 2018-09-29 NOTE — Telephone Encounter (Signed)
Patient was seen this morning and his pain medications were not called in. He needs refills on morphine 30 mg and oxycodone 10 mg. [employer]

## 2018-09-29 NOTE — Telephone Encounter (Signed)
Okay, may give 1 month supply. He needs to schedule f/u with his diabetes specialist.

## 2018-10-01 ENCOUNTER — Ambulatory Visit: Payer: Medicare Other | Admitting: Family Medicine

## 2018-11-23 ENCOUNTER — Other Ambulatory Visit: Payer: Self-pay

## 2018-11-23 ENCOUNTER — Other Ambulatory Visit: Payer: Self-pay | Admitting: Family Medicine

## 2018-11-23 MED ORDER — METFORMIN HCL 500 MG PO TABS: ORAL_TABLET | ORAL | 0 refills | Status: DC

## 2018-12-03 ENCOUNTER — Telehealth: Payer: Self-pay | Admitting: Family Medicine

## 2018-12-03 NOTE — Telephone Encounter (Signed)
Last labs 07/02/18 psa, liver, lipid, a1c

## 2018-12-03 NOTE — Telephone Encounter (Signed)
Please change visit in June to a phone visit due to pt not wanting to come out during covid.

## 2018-12-03 NOTE — Telephone Encounter (Signed)
Patient has 3 month med check on 6/17 and wanting to know if he needed labs done. Please advise

## 2018-12-03 NOTE — Telephone Encounter (Signed)
Pt.notified

## 2018-12-03 NOTE — Telephone Encounter (Signed)
No wait til next cycle

## 2018-12-03 NOTE — Telephone Encounter (Signed)
Left message to return call 

## 2018-12-23 ENCOUNTER — Other Ambulatory Visit: Payer: Self-pay | Admitting: Family Medicine

## 2018-12-30 ENCOUNTER — Ambulatory Visit: Payer: Medicare Other | Admitting: Family Medicine

## 2018-12-30 ENCOUNTER — Ambulatory Visit (INDEPENDENT_AMBULATORY_CARE_PROVIDER_SITE_OTHER): Payer: Medicare Other | Admitting: Family Medicine

## 2018-12-30 ENCOUNTER — Other Ambulatory Visit: Payer: Self-pay

## 2018-12-30 DIAGNOSIS — G894 Chronic pain syndrome: Secondary | ICD-10-CM

## 2018-12-30 DIAGNOSIS — Z79891 Long term (current) use of opiate analgesic: Secondary | ICD-10-CM

## 2018-12-30 DIAGNOSIS — J441 Chronic obstructive pulmonary disease with (acute) exacerbation: Secondary | ICD-10-CM

## 2018-12-30 DIAGNOSIS — I10 Essential (primary) hypertension: Secondary | ICD-10-CM | POA: Diagnosis not present

## 2018-12-30 DIAGNOSIS — E119 Type 2 diabetes mellitus without complications: Secondary | ICD-10-CM

## 2018-12-30 MED ORDER — ENALAPRIL MALEATE 20 MG PO TABS: ORAL_TABLET | ORAL | 1 refills | Status: DC

## 2018-12-30 MED ORDER — FUROSEMIDE 20 MG PO TABS: 20.0000 mg | ORAL_TABLET | Freq: Every day | ORAL | 1 refills | Status: DC

## 2018-12-30 MED ORDER — ATORVASTATIN CALCIUM 20 MG PO TABS: ORAL_TABLET | ORAL | 1 refills | Status: DC

## 2018-12-30 MED ORDER — INSULIN DETEMIR 100 UNIT/ML ~~LOC~~ SOLN: SUBCUTANEOUS | 5 refills | Status: DC

## 2018-12-30 MED ORDER — METFORMIN HCL 500 MG PO TABS: ORAL_TABLET | ORAL | 1 refills | Status: DC

## 2018-12-30 MED ORDER — LEVETIRACETAM 500 MG PO TABS: 1500.0000 mg | ORAL_TABLET | Freq: Two times a day (BID) | ORAL | 1 refills | Status: DC

## 2018-12-30 MED ORDER — MORPHINE SULFATE ER 30 MG PO TBCR: EXTENDED_RELEASE_TABLET | ORAL | 0 refills | Status: DC

## 2018-12-30 MED ORDER — ALPRAZOLAM 1 MG PO TABS: 1.0000 mg | ORAL_TABLET | Freq: Two times a day (BID) | ORAL | 5 refills | Status: DC | PRN

## 2018-12-30 MED ORDER — OXYCODONE HCL 10 MG PO TABS: ORAL_TABLET | ORAL | 0 refills | Status: DC

## 2018-12-30 MED ORDER — TERAZOSIN HCL 10 MG PO CAPS: ORAL_CAPSULE | ORAL | 1 refills | Status: DC

## 2018-12-30 MED ORDER — MORPHINE SULFATE ER 30 MG PO TBCR: 30.0000 mg | EXTENDED_RELEASE_TABLET | Freq: Two times a day (BID) | ORAL | 0 refills | Status: DC

## 2018-12-30 MED ORDER — INSULIN ASPART 100 UNIT/ML ~~LOC~~ SOLN: SUBCUTANEOUS | 5 refills | Status: DC

## 2018-12-30 MED ORDER — MESALAMINE ER 0.375 G PO CP24: ORAL_CAPSULE | ORAL | 5 refills | Status: DC

## 2018-12-30 NOTE — Progress Notes (Signed)
   Subjective:    Patient ID: Shaun Harris, male    DOB: 1965/12/12, 53 y.o.   MRN: [id number] aud plus video HPI  This patient was seen today for chronic pain  The medication list was reviewed and updated.   -Compliance with medication: yes  - Number patient states they take daily: 2 Morphine long acting  and 4 oxycodone short acting  -when was the last dose patient took? today  The patient was advised the importance of maintaining medication and not using illegal substances with these.  Here for refills and follow up  The patient was educated that we can provide 3 monthly scripts for their medication, it is their responsibility to follow the instructions.  Side effects or complications from medications: none  Patient is aware that pain medications are meant to minimize the severity of the pain to allow their pain levels to improve to allow for better function. They are aware of that pain medications cannot totally remove their pain.  Virtual Visit via Video Note  I connected with [name] on 12/30/18 at  9:30 AM EDT by a video enabled telemedicine application and verified that I am speaking with the correct person using two identifiers.  Location: Patient: home Provider: office   I discussed the limitations of evaluation and management by telemedicine and the availability of in person appointments. The patient expressed understanding and agreed to proceed.  History of Present Illness:    Observations/Objective:   Assessment and Plan:   Follow Up Instructions:    I discussed the assessment and treatment plan with the patient. The patient was provided an opportunity to ask questions and all were answered. The patient agreed with the plan and demonstrated an understanding of the instructions.   The patient was advised to call back or seek an in-person evaluation if the symptoms worsen or if the condition fails to improve as anticipated.  I provided 70minutes of  non-face-to-face time during this encounter.   Patient claims compliance with diabetes medication. No obvious side effects. Reports no substantial low sugar spells. Most numbers are generally in good range when checked fasting. Generally does not miss a dose of medication. Watching diabetic diet closely  Blood pressure medicine and blood pressure levels reviewed today with patient. Compliant with blood pressure medicine. States does not miss a dose. No obvious side effects. Blood pressure generally good when checked elsewhere. Watching salt intake.   Patient compliant with pain medication. Continues to experience the pain which led to initiation of analgesic intervention. No significant negative side effects. States definitely needs the pain medication to maintain current level of functioning. Does not receive controlled substance pain medication elsewhere.          Review of Systems No headache, no major weight loss or weight gain, no chest pain no back pain abdominal pain no change in bowel habits complete ROS otherwise negative     Objective:   Physical Exam   Virtual     Assessment & Plan:  Impression chronic pain ongoing discussed need for meds  2.  Hypertension blood pressure good discussed maintain same meds  3.  Type 2 diabetes.  Glucose up at times.  Diet suboptimal discussed and encouraged  4.  Chronic colitis.  Stopped seeing GI doctor long time ago because of insurance challenges.  We will research alternative to current agent not covered  Follow-up in 3 months

## 2018-12-31 MED ORDER — MESALAMINE 400 MG PO CPDR: DELAYED_RELEASE_CAPSULE | ORAL | 11 refills | Status: DC

## 2019-01-15 ENCOUNTER — Other Ambulatory Visit: Payer: Self-pay | Admitting: Family Medicine

## 2019-01-26 ENCOUNTER — Other Ambulatory Visit: Payer: Self-pay | Admitting: Family Medicine

## 2019-02-19 ENCOUNTER — Other Ambulatory Visit: Payer: Self-pay | Admitting: Family Medicine

## 2019-03-24 ENCOUNTER — Other Ambulatory Visit: Payer: Self-pay | Admitting: Family Medicine

## 2019-03-25 ENCOUNTER — Other Ambulatory Visit: Payer: Self-pay | Admitting: Family Medicine

## 2019-03-25 NOTE — Telephone Encounter (Signed)
One yrs worth all

## 2019-04-01 ENCOUNTER — Ambulatory Visit (INDEPENDENT_AMBULATORY_CARE_PROVIDER_SITE_OTHER): Payer: Medicare Other | Admitting: Family Medicine

## 2019-04-01 ENCOUNTER — Telehealth: Payer: Self-pay | Admitting: Family Medicine

## 2019-04-01 DIAGNOSIS — G894 Chronic pain syndrome: Secondary | ICD-10-CM

## 2019-04-01 DIAGNOSIS — J441 Chronic obstructive pulmonary disease with (acute) exacerbation: Secondary | ICD-10-CM

## 2019-04-01 DIAGNOSIS — I10 Essential (primary) hypertension: Secondary | ICD-10-CM

## 2019-04-01 DIAGNOSIS — E119 Type 2 diabetes mellitus without complications: Secondary | ICD-10-CM | POA: Diagnosis not present

## 2019-04-01 MED ORDER — OXYCODONE HCL 10 MG PO TABS: ORAL_TABLET | ORAL | 0 refills | Status: DC

## 2019-04-01 MED ORDER — "INSULIN SYRINGE-NEEDLE U-100 31G X 5/16"" 1 ML MISC": 11 refills | Status: DC

## 2019-04-01 MED ORDER — INSULIN DETEMIR 100 UNIT/ML ~~LOC~~ SOLN: SUBCUTANEOUS | 5 refills | Status: DC

## 2019-04-01 MED ORDER — ENALAPRIL MALEATE 20 MG PO TABS: ORAL_TABLET | ORAL | 1 refills | Status: DC

## 2019-04-01 MED ORDER — ATORVASTATIN CALCIUM 20 MG PO TABS: ORAL_TABLET | ORAL | 1 refills | Status: DC

## 2019-04-01 MED ORDER — FUROSEMIDE 20 MG PO TABS: 20.0000 mg | ORAL_TABLET | Freq: Every day | ORAL | 1 refills | Status: DC

## 2019-04-01 MED ORDER — ALPRAZOLAM 1 MG PO TABS: 1.0000 mg | ORAL_TABLET | Freq: Two times a day (BID) | ORAL | 5 refills | Status: DC | PRN

## 2019-04-01 MED ORDER — LEVETIRACETAM 500 MG PO TABS: 1500.0000 mg | ORAL_TABLET | Freq: Two times a day (BID) | ORAL | 1 refills | Status: DC

## 2019-04-01 MED ORDER — MORPHINE SULFATE ER 30 MG PO TBCR: EXTENDED_RELEASE_TABLET | ORAL | 0 refills | Status: DC

## 2019-04-01 MED ORDER — MORPHINE SULFATE ER 30 MG PO TBCR: 30.0000 mg | EXTENDED_RELEASE_TABLET | Freq: Two times a day (BID) | ORAL | 0 refills | Status: DC

## 2019-04-01 MED ORDER — FLUTICASONE-SALMETEROL 250-50 MCG/DOSE IN AEPB: INHALATION_SPRAY | RESPIRATORY_TRACT | 3 refills | Status: DC

## 2019-04-01 MED ORDER — METFORMIN HCL 500 MG PO TABS: 1000.0000 mg | ORAL_TABLET | Freq: Every day | ORAL | 1 refills | Status: DC

## 2019-04-01 MED ORDER — INSULIN ASPART 100 UNIT/ML ~~LOC~~ SOLN: SUBCUTANEOUS | 5 refills | Status: DC

## 2019-04-01 MED ORDER — TRUE METRIX BLOOD GLUCOSE TEST VI STRP: ORAL_STRIP | 0 refills | Status: DC

## 2019-04-01 MED ORDER — NIACIN ER (ANTIHYPERLIPIDEMIC) 1000 MG PO TBCR: EXTENDED_RELEASE_TABLET | ORAL | 1 refills | Status: DC

## 2019-04-01 NOTE — Telephone Encounter (Signed)
Pt had virtual visit with Dr. Richardson Landry this morning & he's at the pharmacy to pick up his prescriptions & the pharmacy has not received   morphine (MS CONTIN) 30 MG 12 hr tablet    Oxycodone HCl 10 MG TABS   Please advise & call pt     [facility] [employer]

## 2019-04-01 NOTE — Progress Notes (Signed)
Subjective:  Audio only  Patient ID: Shaun Harris, male    DOB: Jul 14, 1966, 53 y.o.   MRN: [id number]  HPI pt states bp at the dentist this week was 110/73.  This patient was seen today for chronic pain. Takes for pain in head  The medication list was reviewed and updated.   -Compliance with medication: takes one 4 times a day  - Number patient states they take daily: oxycodone one 4 times a day and morphine one bid  -when was the last dose patient took? today  The patient was advised the importance of maintaining medication and not using illegal substances with these.  Here for refills and follow up  The patient was educated that we can provide 3 monthly scripts for their medication, it is their responsibility to follow the instructions.  Side effects or complications from medications: none  Patient is aware that pain medications are meant to minimize the severity of the pain to allow their pain levels to improve to allow for better function. They are aware of that pain medications cannot totally remove their pain.  Due for UDT ( at least once per year) : last one was 01/01/2018.   Sugar has been running around 150 -175 on average.   Pt states no concerns today.   Virtual Visit via Telephone Note  I connected with [name] on 04/01/19 at  9:00 AM EDT by telephone and verified that I am speaking with the correct person using two identifiers.  Location: Patient: home Provider: office   I discussed the limitations, risks, security and privacy concerns of performing an evaluation and management service by telephone and the availability of in person appointments. I also discussed with the patient that there may be a patient responsible charge related to this service. The patient expressed understanding and agreed to proceed.   History of Present Illness:    Observations/Objective:   Assessment and Plan:   Follow Up Instructions:    I discussed the assessment  and treatment plan with the patient. The patient was provided an opportunity to ask questions and all were answered. The patient agreed with the plan and demonstrated an understanding of the instructions.   The patient was advised to call back or seek an in-person evaluation if the symptoms worsen or if the condition fails to improve as anticipated.  I provided 25 minutes of non-face-to-face time during this encounter.    Patient claims compliance with diabetes medication. No obvious side effects. Reports no substantial low sugar spells. Most numbers are generally in good range when checked fasting. Generally does not miss a dose of medication. Watching diabetic diet closely  Blood pressure medicine and blood pressure levels reviewed today with patient. Compliant with blood pressure medicine. States does not miss a dose. No obvious side effects. Blood pressure generally good when checked elsewhere. Watching salt intake.   Patient compliant with pain medication. Continues to experience the pain which led to initiation of analgesic intervention. No significant negative side effects. States definitely needs the pain medication to maintain current level of functioning. Does not receive controlled substance pain medication elsewhere.       Review of Systems No headache, no major weight loss or weight gain, no chest pain no back pain abdominal pain no change in bowel habits complete ROS otherwise negative     Objective:   Physical Exam   Virtual     Assessment & Plan:  Impression hypertension.  Good control discussed to  maintain same meds  2.  Type 2 diabetes apparent decent control discussed diet discussed compliance because medications refilled  3.  Asthma clinically stable patient unfortunately still smoking smoking cessation discussed  4.  Chronic pain  Impression: Chronic pain. Patient compliant with medication. No substantial side effects. [place] controlled substance  registry reviewed to ensure compliance and proper use of medication. Patient aware goal of medicine is not complete resolution of pain but to control his symptoms to improve his functional capacity. Aware of potential adverse side effects

## 2019-04-01 NOTE — Telephone Encounter (Signed)
Scripts are pended. Will call pt and let him know after dr Richardson Landry signs

## 2019-04-04 ENCOUNTER — Encounter: Payer: Self-pay | Admitting: Family Medicine

## 2019-04-21 ENCOUNTER — Other Ambulatory Visit: Payer: Self-pay | Admitting: Family Medicine

## 2019-05-21 ENCOUNTER — Other Ambulatory Visit: Payer: Self-pay | Admitting: Family Medicine

## 2019-05-26 ENCOUNTER — Telehealth: Payer: Self-pay | Admitting: *Deleted

## 2019-05-26 ENCOUNTER — Other Ambulatory Visit: Payer: Self-pay | Admitting: *Deleted

## 2019-05-26 MED ORDER — METFORMIN HCL 500 MG PO TABS: ORAL_TABLET | ORAL | 1 refills | Status: DC

## 2019-05-26 NOTE — Telephone Encounter (Signed)
done

## 2019-05-26 NOTE — Telephone Encounter (Signed)
Shaun Harris from [facility] calling because they got a script for metfomin 500mg  take 2 daily on 11/6 and when pt went to pick it up he told them he takes 2qam and one in the evening. I looked at history and it was sent in 12/23/18 for 2 qam and one in the evening and I didn't see a note in chart to change it. Is it ok to change back to the way pt states he is taking 2qam and one in the evening.

## 2019-05-26 NOTE — Telephone Encounter (Signed)
Yes 6 mo worth 

## 2019-07-02 ENCOUNTER — Ambulatory Visit (INDEPENDENT_AMBULATORY_CARE_PROVIDER_SITE_OTHER): Payer: Medicare Other | Admitting: Family Medicine

## 2019-07-02 ENCOUNTER — Other Ambulatory Visit: Payer: Self-pay

## 2019-07-02 DIAGNOSIS — J441 Chronic obstructive pulmonary disease with (acute) exacerbation: Secondary | ICD-10-CM

## 2019-07-02 DIAGNOSIS — E119 Type 2 diabetes mellitus without complications: Secondary | ICD-10-CM

## 2019-07-02 DIAGNOSIS — G894 Chronic pain syndrome: Secondary | ICD-10-CM

## 2019-07-02 MED ORDER — TERAZOSIN HCL 10 MG PO CAPS: ORAL_CAPSULE | ORAL | 1 refills | Status: DC

## 2019-07-02 MED ORDER — LEVETIRACETAM 500 MG PO TABS: 1500.0000 mg | ORAL_TABLET | Freq: Two times a day (BID) | ORAL | 1 refills | Status: DC

## 2019-07-02 MED ORDER — ALPRAZOLAM 1 MG PO TABS: 1.0000 mg | ORAL_TABLET | Freq: Two times a day (BID) | ORAL | 5 refills | Status: DC | PRN

## 2019-07-02 MED ORDER — INSULIN ASPART 100 UNIT/ML ~~LOC~~ SOLN: SUBCUTANEOUS | 5 refills | Status: DC

## 2019-07-02 MED ORDER — INSULIN DETEMIR 100 UNIT/ML ~~LOC~~ SOLN: SUBCUTANEOUS | 5 refills | Status: DC

## 2019-07-02 MED ORDER — ENALAPRIL MALEATE 20 MG PO TABS: ORAL_TABLET | ORAL | 1 refills | Status: DC

## 2019-07-02 MED ORDER — METFORMIN HCL 500 MG PO TABS: ORAL_TABLET | ORAL | 1 refills | Status: DC

## 2019-07-02 MED ORDER — ATORVASTATIN CALCIUM 20 MG PO TABS: ORAL_TABLET | ORAL | 1 refills | Status: DC

## 2019-07-02 MED ORDER — FUROSEMIDE 20 MG PO TABS: 20.0000 mg | ORAL_TABLET | Freq: Every day | ORAL | 1 refills | Status: DC

## 2019-07-02 MED ORDER — NIACIN ER (ANTIHYPERLIPIDEMIC) 1000 MG PO TBCR: EXTENDED_RELEASE_TABLET | ORAL | 1 refills | Status: DC

## 2019-07-02 NOTE — Progress Notes (Signed)
Subjective:    Patient ID: Shaun Harris, male    DOB: 08/26/1965, 53 y.o.   MRN: [id number]  HPI This patient was seen today for chronic pain  The medication list was reviewed and updated.   -Compliance with medication: yes  - Number patient states they take daily: oxycodone 4 a day and oxycontin twice a day  -when was the last dose patient took? today  The patient was advised the importance of maintaining medication and not using illegal substances with these.  Here for refills and follow up  The patient was educated that we can provide 3 monthly scripts for their medication, it is their responsibility to follow the instructions.  Side effects or complications from medications: none  Patient is aware that pain medications are meant to minimize the severity of the pain to allow their pain levels to improve to allow for better function. They are aware of that pain medications cannot totally remove their pain.  Due for UDT ( at least once per year) : last one 01/01/18. Doing virtual visit so unable to do urine drug screen today.   Pulmonary doctor at [facility] canceled wellbutrin and pt states he has been smoking more since then.     Virtual Visit via Telephone Note  I connected with [name] on 07/02/19 at  1:10 PM EST by telephone and verified that I am speaking with the correct person using two identifiers.  Location: Patient: home Provider: office   I discussed the limitations, risks, security and privacy concerns of performing an evaluation and management service by telephone and the availability of in person appointments. I also discussed with the patient that there may be a patient responsible charge related to this service. The patient expressed understanding and agreed to proceed.   History of Present Illness:    Observations/Objective:   Assessment and Plan:   Follow Up Instructions:    I discussed the assessment and treatment plan with the patient. The  patient was provided an opportunity to ask questions and all were answered. The patient agreed with the plan and demonstrated an understanding of the instructions.   The patient was advised to call back or seek an in-person evaluation if the symptoms worsen or if the condition fails to improve as anticipated.  I provided26 minutes of non-face-to-face time during this encounter.    Patient claims compliance with diabetes medication. No obvious side effects. Reports no substantial low sugar spells. Most numbers are generally in good range when checked fasting. Generally does not miss a dose of medication. Watching diabetic diet closely  Blood pressure medicine and blood pressure levels reviewed today with patient. Compliant with blood pressure medicine. States does not miss a dose. No obvious side effects. Blood pressure generally good when checked elsewhere. Watching salt intake.   Asthma  .api Patient compliant with pain medication. Continues to experience the pain which led to initiation of analgesic intervention. No significant negative side effects. States definitely needs the pain medication to maintain current level of functioning. Does not receive controlled substance pain medication elsewhere.      Review of Systems No headache, no major weight loss or weight gain, no chest pain no back pain abdominal pain no change in bowel habits complete ROS otherwise negative     Objective:   Physical Exam   virtual     Assessment & Plan:  Type 2 diabetes.  Overall good control discussed maintain same meds  2.  Asthma ongoing.  Unfortunately still smoking encouraged to stop maintain same medications warning signs discussed  3.  Chronic pain  Impression: Chronic pain. Patient compliant with medication. No substantial side effects. [place] controlled substance registry reviewed to ensure compliance and proper use of medication. Patient aware goal of medicine is not complete  resolution of pain but to control his symptoms to improve his functional capacity. Aware of potential adverse side effects Greater than 50% of this 25 minutenon face to face visit was spent in counseling and discussion and coordination of care regarding the above diagnosis/diagnosies f u in three mo

## 2019-07-04 MED ORDER — MORPHINE SULFATE ER 30 MG PO TBCR: EXTENDED_RELEASE_TABLET | ORAL | 0 refills | Status: DC

## 2019-07-04 MED ORDER — OXYCODONE HCL 10 MG PO TABS: ORAL_TABLET | ORAL | 0 refills | Status: DC

## 2019-07-04 MED ORDER — MORPHINE SULFATE ER 30 MG PO TBCR: 30.0000 mg | EXTENDED_RELEASE_TABLET | Freq: Two times a day (BID) | ORAL | 0 refills | Status: DC

## 2019-07-05 ENCOUNTER — Telehealth: Payer: Self-pay | Admitting: Family Medicine

## 2019-07-05 ENCOUNTER — Other Ambulatory Visit: Payer: Self-pay | Admitting: *Deleted

## 2019-07-05 MED ORDER — BUPROPION HCL ER (SR) 150 MG PO TB12: 150.0000 mg | ORAL_TABLET | Freq: Two times a day (BID) | ORAL | 11 refills | Status: DC

## 2019-07-05 NOTE — Telephone Encounter (Signed)
Med sent to pharm with refills.

## 2019-07-05 NOTE — Telephone Encounter (Signed)
Ok resume at prior dose 12 mo

## 2019-07-05 NOTE — Telephone Encounter (Signed)
Patient is requesting refill wellibutrin called into [facility]. He had phone visit on 12/18

## 2019-07-05 NOTE — Telephone Encounter (Signed)
Pt states specialist stopped the med. I called pt and he states he talked to dr Richardson Landry and he was going to put him back on it. Wellbutrin 150mg  one bid. [place]. Does not need a call back unless there is a problem with getting the med.

## 2019-08-04 ENCOUNTER — Other Ambulatory Visit: Payer: Self-pay | Admitting: Family Medicine

## 2019-08-04 NOTE — Telephone Encounter (Signed)
Left message to return call 

## 2019-08-04 NOTE — Telephone Encounter (Signed)
No we do not do

## 2019-08-04 NOTE — Telephone Encounter (Signed)
Pt has been unable to get this shot refilled through doctor at [facility] and they are hoping Dr. Richardson Landry can fill this. He was suppose to have the shot yesterday and has not had it.

## 2019-08-04 NOTE — Telephone Encounter (Signed)
Correction: Ozempic 0.5 mg/dose injection. Please advise. Thank you

## 2019-08-05 NOTE — Telephone Encounter (Signed)
Patient notified and verbalized understanding. 

## 2019-08-18 ENCOUNTER — Other Ambulatory Visit: Payer: Self-pay | Admitting: Family Medicine

## 2019-08-18 ENCOUNTER — Encounter: Payer: Self-pay | Admitting: Family Medicine

## 2019-08-18 NOTE — Telephone Encounter (Signed)
Seen 07/02/19

## 2019-09-15 ENCOUNTER — Other Ambulatory Visit: Payer: Self-pay | Admitting: Family Medicine

## 2019-09-22 ENCOUNTER — Ambulatory Visit (INDEPENDENT_AMBULATORY_CARE_PROVIDER_SITE_OTHER): Payer: Medicare Other | Admitting: Family Medicine

## 2019-09-22 ENCOUNTER — Other Ambulatory Visit: Payer: Self-pay

## 2019-09-22 DIAGNOSIS — E119 Type 2 diabetes mellitus without complications: Secondary | ICD-10-CM

## 2019-09-22 DIAGNOSIS — Z79899 Other long term (current) drug therapy: Secondary | ICD-10-CM

## 2019-09-22 DIAGNOSIS — I10 Essential (primary) hypertension: Secondary | ICD-10-CM

## 2019-09-22 DIAGNOSIS — Z13220 Encounter for screening for lipoid disorders: Secondary | ICD-10-CM

## 2019-09-22 DIAGNOSIS — Z125 Encounter for screening for malignant neoplasm of prostate: Secondary | ICD-10-CM

## 2019-09-22 DIAGNOSIS — G894 Chronic pain syndrome: Secondary | ICD-10-CM

## 2019-09-22 MED ORDER — MORPHINE SULFATE ER 30 MG PO TBCR: EXTENDED_RELEASE_TABLET | ORAL | 0 refills | Status: DC

## 2019-09-22 MED ORDER — OXYCODONE HCL 10 MG PO TABS: ORAL_TABLET | ORAL | 0 refills | Status: DC

## 2019-09-22 MED ORDER — BUPROPION HCL ER (SR) 150 MG PO TB12: 150.0000 mg | ORAL_TABLET | Freq: Two times a day (BID) | ORAL | 1 refills | Status: DC

## 2019-09-22 MED ORDER — INSULIN ASPART 100 UNIT/ML ~~LOC~~ SOLN: SUBCUTANEOUS | 5 refills | Status: DC

## 2019-09-22 MED ORDER — ATORVASTATIN CALCIUM 20 MG PO TABS: ORAL_TABLET | ORAL | 1 refills | Status: DC

## 2019-09-22 MED ORDER — MORPHINE SULFATE ER 30 MG PO TBCR: 30.0000 mg | EXTENDED_RELEASE_TABLET | Freq: Two times a day (BID) | ORAL | 0 refills | Status: DC

## 2019-09-22 MED ORDER — ENALAPRIL MALEATE 20 MG PO TABS: ORAL_TABLET | ORAL | 1 refills | Status: DC

## 2019-09-22 MED ORDER — LEVETIRACETAM 500 MG PO TABS: 1500.0000 mg | ORAL_TABLET | Freq: Two times a day (BID) | ORAL | 1 refills | Status: DC

## 2019-09-22 MED ORDER — ALPRAZOLAM 1 MG PO TABS: 1.0000 mg | ORAL_TABLET | Freq: Two times a day (BID) | ORAL | 5 refills | Status: DC | PRN

## 2019-09-22 MED ORDER — INSULIN DETEMIR 100 UNIT/ML ~~LOC~~ SOLN: SUBCUTANEOUS | 5 refills | Status: DC

## 2019-09-22 MED ORDER — METFORMIN HCL 500 MG PO TABS: ORAL_TABLET | ORAL | 1 refills | Status: DC

## 2019-09-22 MED ORDER — FUROSEMIDE 20 MG PO TABS: 20.0000 mg | ORAL_TABLET | Freq: Every day | ORAL | 1 refills | Status: DC

## 2019-09-22 NOTE — Progress Notes (Signed)
Subjective:  Audio on  Patient ID: Shaun Harris, male    DOB: 1966-02-25, 54 y.o.   MRN: [id number]  HPI This patient was seen today for chronic pain  The medication list was reviewed and updated.   -Compliance with medication: yes  - Number patient states they take daily: oxycoodne 4 times a day and morphine twice a day  -when was the last dose patient took? today  The patient was advised the importance of maintaining medication and not using illegal substances with these.  Here for refills and follow up  The patient was educated that we can provide 3 monthly scripts for their medication, it is their responsibility to follow the instructions.  Side effects or complications from medications: none  Patient is aware that pain medications are meant to minimize the severity of the pain to allow their pain levels to improve to allow for better function. They are aware of that pain medications cannot totally remove their pain.  Due for UDT ( at least once per year) : last one  01/01/18. Unable to get urine sample today since he is doing a virtual visit.   Pt states no concerns today.  Virtual Visit via Telephone Note  I connected with Shaun Harris on 09/22/19 at  9:00 AM EST by telephone and verified that I am speaking with the correct person using two identifiers.  Location: Patient: home Provider: office   I discussed the limitations, risks, security and privacy concerns of performing an evaluation and management service by telephone and the availability of in person appointments. I also discussed with the patient that there may be a patient responsible charge related to this service. The patient expressed understanding and agreed to proceed.   History of Present Illness:    Observations/Objective:   Assessment and Plan:   Follow Up Instructions:    I discussed the assessment and treatment plan with the patient. The patient was provided an opportunity to ask  questions and all were answered. The patient agreed with the plan and demonstrated an understanding of the instructions.   The patient was advised to call back or seek an in-person evaluation if the symptoms worsen or if the condition fails to improve as anticipated.  I provided 25 minutes of non-face-to-face time during this encounter.   Patient compliant with pain medication. Continues to experience the pain which led to initiation of analgesic intervention. No significant negative side effects. States definitely needs the pain medication to maintain current level of functioning. Does not receive controlled substance pain medication elsewhere.  Patient continues to take lipid medication regularly. No obvious side effects from it. Generally does not miss a dose. Prior blood work results are reviewed with patient. Patient continues to work on fat intake in diet  Blood pressure medicine and blood pressure levels reviewed today with patient. Compliant with blood pressure medicine. States does not miss a dose. No obvious side effects. Blood pressure generally good when checked elsewhere. Watching salt intake.   Patient claims compliance with diabetes medication. No obvious side effects. Reports no substantial low sugar spells. Most numbers are generally in good range when checked fasting. Generally does not miss a dose of medication. Watching diabetic diet closely           Review of Systems No chest pain no abdominal pain.  Some shortness of breath with exertion    Objective:   Physical Exam   Virtual     Assessment & Plan:  Impression  1 chronic pain.  Patient has had severe unrelenting disabling headaches for over a decade.  He had multiple negative work-ups with no great answers and eventual disability.  He was placed on a high dose narcotics by his prior pain specialist.  We basically inherited him on this level of medication.  Patient is compliant with this medication.  States  ongoing need for etc. assist him with his pain.  He is on rather high-dose medication and he realizes we can not go any higher on his dosage  2.  Type 2 diabetes exact control uncertain  3.  Hyperlipidemia exact control uncertain  4.  Hypertension  COPD/asthma followed by pulmonary specialist  Appropriate blood work.  Pain medications refilled.  Follow-up in 3 months.

## 2019-10-14 ENCOUNTER — Other Ambulatory Visit: Payer: Self-pay | Admitting: Family Medicine

## 2019-10-26 ENCOUNTER — Other Ambulatory Visit: Payer: Self-pay | Admitting: Family Medicine

## 2019-11-05 ENCOUNTER — Other Ambulatory Visit: Payer: Self-pay | Admitting: Family Medicine

## 2019-11-08 ENCOUNTER — Other Ambulatory Visit: Payer: Self-pay | Admitting: Family Medicine

## 2019-11-09 LAB — BASIC METABOLIC PANEL
BUN/Creatinine Ratio: 18 (ref 9–20)
BUN: 20 mg/dL (ref 6–24)
CO2: 22 mmol/L (ref 20–29)
Calcium: 9.7 mg/dL (ref 8.7–10.2)
Chloride: 104 mmol/L (ref 96–106)
Creatinine, Ser: 1.11 mg/dL (ref 0.76–1.27)
GFR calc Af Amer: 87 mL/min/{1.73_m2} (ref >59)
GFR calc non Af Amer: 75 mL/min/{1.73_m2} (ref >59)
Glucose: 190 mg/dL — ABNORMAL HIGH (ref 65–99)
Potassium: 5 mmol/L (ref 3.5–5.2)
Sodium: 142 mmol/L (ref 134–144)

## 2019-11-09 LAB — LIPID PANEL
Chol/HDL Ratio: 2.2 ratio (ref 0.0–5.0)
Cholesterol, Total: 108 mg/dL (ref 100–199)
HDL: 50 mg/dL (ref >39)
LDL Chol Calc (NIH): 43 mg/dL (ref 0–99)
Triglycerides: 69 mg/dL (ref 0–149)
VLDL Cholesterol Cal: 15 mg/dL (ref 5–40)

## 2019-11-09 LAB — HEPATIC FUNCTION PANEL
ALT: 14 IU/L (ref 0–44)
AST: 15 IU/L (ref 0–40)
Albumin: 4.8 g/dL (ref 3.8–4.9)
Alkaline Phosphatase: 76 IU/L (ref 39–117)
Bilirubin Total: <0.2 mg/dL (ref 0.0–1.2)
Bilirubin, Direct: 0.09 mg/dL (ref 0.00–0.40)
Total Protein: 7 g/dL (ref 6.0–8.5)

## 2019-11-09 LAB — MICROALBUMIN / CREATININE URINE RATIO
Creatinine, Urine: 136.5 mg/dL
Microalb/Creat Ratio: 11 mg/g creat (ref 0–29)
Microalbumin, Urine: 15 ug/mL

## 2019-11-09 LAB — PSA: Prostate Specific Ag, Serum: 2.6 ng/mL (ref 0.0–4.0)

## 2019-11-14 ENCOUNTER — Encounter: Payer: Self-pay | Admitting: Family Medicine

## 2019-11-23 ENCOUNTER — Telehealth: Payer: Self-pay | Admitting: Orthopaedic Surgery

## 2019-11-23 NOTE — Telephone Encounter (Signed)
Call received from [facility], [place], [place] [phone] to inquire as to whether his faxed request, sent today, was received for this patient; confirmed it has been received. Mr. Shaun Harris is aware of our medical records process through provider [facility]. He states he would like to pick up the records if possible. Relayed request will be processed through [facility] as per protocol, and he would be further advised regarding receiving of any records.

## 2019-12-02 ENCOUNTER — Telehealth: Payer: Self-pay | Admitting: Family Medicine

## 2019-12-02 DIAGNOSIS — F419 Anxiety disorder, unspecified: Secondary | ICD-10-CM

## 2019-12-02 DIAGNOSIS — Z029 Encounter for administrative examinations, unspecified: Secondary | ICD-10-CM

## 2019-12-02 DIAGNOSIS — G8929 Other chronic pain: Secondary | ICD-10-CM

## 2019-12-02 NOTE — Telephone Encounter (Signed)
Attempted to contact patient. Someone picked up but then phone hung up.  Will try again later.

## 2019-12-02 NOTE — Telephone Encounter (Signed)
Shaun Harris, Malena M, DO  [facility]  Pls call this pt, regarding med check for pain meds/controlled sub f/u on 6/16.   Pt will likely be referred for his level of chronic medications. He can keep appt if he wishes, but will be given referral for this dosage of medications. Will take care of any other routine medical concerns, but not pain management or anxiety.   Please submit referral if he wants at this time.    Xanax 1mg  bid,  MS contin 30mg  bid,  Oxycodone 10mg  qid.   Thank you,   Malena Shaun Harris

## 2019-12-10 NOTE — Telephone Encounter (Signed)
Left message to return call 

## 2019-12-10 NOTE — Telephone Encounter (Signed)
Ok thanks  Dr. Megahn Killings  

## 2019-12-10 NOTE — Telephone Encounter (Signed)
Discussed with pt. Pt verbalized understanding and did want referrals. Referral put in to pain clinic for chronic pain and psy for anxiety.

## 2019-12-10 NOTE — Addendum Note (Signed)
Addended by: Carmelina Noun on: 12/10/2019 09:36 AM   Modules accepted: Orders

## 2019-12-16 ENCOUNTER — Other Ambulatory Visit: Payer: Self-pay | Admitting: Family Medicine

## 2019-12-27 ENCOUNTER — Encounter: Payer: Self-pay | Admitting: Family Medicine

## 2019-12-29 ENCOUNTER — Encounter: Payer: Self-pay | Admitting: Family Medicine

## 2019-12-29 ENCOUNTER — Ambulatory Visit (INDEPENDENT_AMBULATORY_CARE_PROVIDER_SITE_OTHER): Payer: Medicare Other | Admitting: Family Medicine

## 2019-12-29 ENCOUNTER — Other Ambulatory Visit: Payer: Self-pay

## 2019-12-29 VITALS — BP 122/86 | HR 114 | Temp 98.2°F | Wt 224.8 lb

## 2019-12-29 DIAGNOSIS — E119 Type 2 diabetes mellitus without complications: Secondary | ICD-10-CM

## 2019-12-29 DIAGNOSIS — G894 Chronic pain syndrome: Secondary | ICD-10-CM

## 2019-12-29 DIAGNOSIS — R519 Headache, unspecified: Secondary | ICD-10-CM

## 2019-12-29 DIAGNOSIS — G8929 Other chronic pain: Secondary | ICD-10-CM

## 2019-12-29 DIAGNOSIS — Z79891 Long term (current) use of opiate analgesic: Secondary | ICD-10-CM | POA: Diagnosis not present

## 2019-12-29 DIAGNOSIS — Z981 Arthrodesis status: Secondary | ICD-10-CM

## 2019-12-29 LAB — POCT GLYCOSYLATED HEMOGLOBIN (HGB A1C): Hemoglobin A1C: 5.9 % — AB (ref 4.0–5.6)

## 2019-12-29 MED ORDER — OXYCODONE HCL 10 MG PO TABS: ORAL_TABLET | ORAL | 0 refills | Status: AC

## 2019-12-29 MED ORDER — MORPHINE SULFATE ER 30 MG PO TBCR: 30.0000 mg | EXTENDED_RELEASE_TABLET | Freq: Two times a day (BID) | ORAL | 0 refills | Status: AC

## 2019-12-29 NOTE — Progress Notes (Signed)
Patient ID: Shaun Harris, male    DOB: [birth year], 54 y.o.   MRN: [id number]   Chief Complaint  Patient presents with  . med check    chronic headaches/pain   Subjective:    HPI This patient was seen today for chronic pain and h/o chronic headaches for the reason of taking high doses of opiates.  Pt also stating had recent death of his wife 6 wks ago.  Taking xanax and wellbutrin for depression/anxiety. Pt reporting seen by multiple neurologists in the past.  Pt had accident where he fell out of a deer stand.  Left with neck pain and headaches chronically.  Had neck surgery.  Last image of neck was 2018-  CT-c-spine IMPRESSION: 1. Solid anterior fusion at C4-5, C5-6, and C6-7. 2. Mild left foraminal narrowing at C5-6 secondary to uncovertebral and facet spurring. 3. Rightward disc osteophyte complex at C6-7 with moderate foraminal narrowing, right greater than left. 4. Left paramedian disc osteophyte complex at C3-4 with mild left foraminal stenosis.  The medication list was reviewed and updated.   -Compliance with medication: yes  - Number patient states they take daily:  Morphine- 2 per day. Last dose: Last night  Oxycodone: 4 per day. Last dose: This AM  The patient was advised the importance of maintaining medication and not using illegal substances with these.  Here for refills and follow up  The patient was educated that we can provide 3 monthly scripts for their medication, it is their responsibility to follow the instructions.  Side effects or complications from medications: none  Patient is aware that pain medications are meant to minimize the severity of the pain to allow their pain levels to improve to allow for better function. They are aware of that pain medications cannot totally remove their pain.  Due for UDT ( at least once per year) : last one 2019, and done today  Results for orders placed or performed in visit on 12/29/19  POCT glycosylated  hemoglobin (Hb A1C)  Result Value Ref Range   Hemoglobin A1C 5.9 (A) 4.0 - 5.6 %   HbA1c POC (<> result, manual entry)     HbA1c, POC (prediabetic range)     HbA1c, POC (controlled diabetic range)         Medical History Shaun Harris has a past medical history of Anxiety, Arthritis, Cerebral vasculitis, Chronic headaches, COPD (chronic obstructive pulmonary disease) ([place]), COPD (chronic obstructive pulmonary disease) ([place]), Diabetes mellitus, Hyperlipidemia, Hypertension, Hypertriglyceridemia, Sleep apnea, Stroke ([place]), Tobacco abuse, Ulcerative colitis ([place]), Ulcerative colitis ([place]), and Venous stasis.   Outpatient Encounter Medications as of 12/29/2019  Medication Sig  . albuterol (VENTOLIN HFA) 108 (90 Base) MCG/ACT inhaler INHALE 2 PUFFS BY MOUTH EVERY 6 HOURS AS NEEDED FOR SHORTNESS OF BREAT H  . Alcohol Swabs (PHARMACIST CHOICE ALCOHOL) PADS Reported on 01/23/2016  . ALPRAZolam (XANAX) 1 MG tablet Take 1 tablet (1 mg total) by mouth 2 (two) times daily as needed.  Marland Kitchen aspirin EC 325 MG tablet Take 325 mg by mouth every morning. Reported on 01/23/2016  . atorvastatin (LIPITOR) 20 MG tablet TAKE 1 TABLET BY MOUTH AT BEDTIME FOR CHOLESTEROL  . Blood Glucose Monitoring Suppl w/Device KIT Test glucose 4 times daily. E11.9  . buPROPion (WELLBUTRIN SR) 150 MG 12 hr tablet Take 1 tablet (150 mg total) by mouth 2 (two) times daily.  . enalapril (VASOTEC) 20 MG tablet TAKE (2) TABLETS BY MOUTH ONCE DAILY.  Marland Kitchen Fluticasone-Salmeterol (ADVAIR DISKUS) 250-50 MCG/DOSE AEPB  USE 1 INHALATION BY MOUTH EVERY 12 HOURS. <<RINSE MOUTH AFTER USE>>.  . furosemide (LASIX) 20 MG tablet Take 1 tablet (20 mg total) by mouth daily.  . hydrocortisone (ANUSOL-HC) 2.5 % rectal cream Place 1 application rectally 2 (two) times daily.  . hyoscyamine (LEVSIN SL) 0.125 MG SL tablet TAKE (1) TABLET UNDER TONGUE EVERY FOUR HOURS AS NEEDED FOR CRAMPING.  . insulin aspart (NOVOLOG) 100 UNIT/ML injection INJECT S.Q. 4 TO 12 UNITS FOUR  TIMES DAILY PER SLIDING SCALE.  . insulin detemir (LEVEMIR) 100 UNIT/ML injection INJECT 40 UNITS SUBCUTANEOUSLY TWICE DAILY.  Marland Kitchen Insulin Syringe-Needle U-100 (TRUEPLUS INSULIN SYRINGE) 31G X 5/16" 1 ML MISC USE AS DIRECTED  . levETIRAcetam (KEPPRA) 500 MG tablet Take 3 tablets (1,500 mg total) by mouth 2 (two) times daily.  . Mesalamine (ASACOL) 400 MG CPDR DR capsule plz cancel the asipro prescription and use this generic agent in its place  . metFORMIN (GLUCOPHAGE) 500 MG tablet TAKE (2) TABLETS QAM AND ONE TABLET IN THE EVENINGS  . morphine (MS CONTIN) 30 MG 12 hr tablet Take 1 tablet (30 mg total) by mouth 2 (two) times daily. May fill 60 days after 09/30/2017  . Multiple Vitamins-Minerals (CENTRUM PO) Take 1 tablet by mouth daily. Reported on 01/23/2016  . naproxen (NAPROSYN) 500 MG tablet TAKE (1) TABLET TWICE A DAY WITH FOOD---BREAKFAST AND SUPPER.  . niacin (NIASPAN) 1000 MG CR tablet TAKE (2) TABLETS BY MOUTH ONCE DAILY.  Marland Kitchen Oxycodone HCl 10 MG TABS Take 1 tablet by mouth 4 times a day as needed for pain. Do not drive or operate machinery while on this medicine.  Marland Kitchen OZEMPIC 0.25 or 0.5 MG/DOSE SOPN   . terazosin (HYTRIN) 10 MG capsule TAKE (1) CAPSULE BY MOUTH ONCE DAILY.  Marland Kitchen tiZANidine (ZANAFLEX) 4 MG tablet TAKE 1 TABLET THREE TIMES DAILY AS NEEDED FOR MUSCLE SPASM  . TRUE METRIX BLOOD GLUCOSE TEST test strip CHECK BLOOD SUGAR 4 TIMES DAILY.  . vitamin C (ASCORBIC ACID) 500 MG tablet Take 500 mg by mouth daily. Reported on 01/23/2016  . [DISCONTINUED] morphine (MS CONTIN) 30 MG 12 hr tablet Take 1 tablet (30 mg total) by mouth 2 (two) times daily. May fill 60 days after 09/30/2017  . [DISCONTINUED] morphine (MS CONTIN) 30 MG 12 hr tablet Take 1 tablet (30 mg total) by mouth 2 (two) times daily  . [DISCONTINUED] morphine (MS CONTIN) 30 MG 12 hr tablet Take 1 tablet (30 mg total) by mouth 2 (two) times daily  . [DISCONTINUED] Oxycodone HCl 10 MG TABS Take 1 tablet by mouth 4 times a day as  needed for pain. Do not drive or operate machinery while on this medicine.  . [DISCONTINUED] Oxycodone HCl 10 MG TABS Take 1 tablet by mouth 4 times a day as needed for pain. Do not drive or operate machinery while on this medicine.  . [DISCONTINUED] Oxycodone HCl 10 MG TABS Take 1 tablet by mouth 4 times a day as needed for pain. Do not drive or operate machinery while on this medicine.   No facility-administered encounter medications on file as of 12/29/2019.     Review of Systems  Constitutional: Negative for chills and fever.  HENT: Negative for congestion, rhinorrhea and sore throat.   Respiratory: Negative for cough, shortness of breath and wheezing.   Cardiovascular: Negative for chest pain and leg swelling.  Gastrointestinal: Negative for abdominal pain, diarrhea, nausea and vomiting.  Genitourinary: Negative for dysuria and frequency.  Skin: Negative for rash.  Neurological: Negative for dizziness, weakness and headaches.     Vitals BP 122/86   Pulse (!) 114   Temp 98.2 F (36.8 C)   Wt 224 lb 12.8 oz (102 kg)   SpO2 99%   BMI 36.28 kg/m   Objective:   Physical Exam Vitals and nursing note reviewed.  Constitutional:      General: He is not in acute distress.    Appearance: Normal appearance. He is not ill-appearing.  HENT:     Head: Normocephalic.     Nose: Nose normal.     Mouth/Throat:     Mouth: Mucous membranes are moist.  Eyes:     Extraocular Movements: Extraocular movements intact.     Conjunctiva/sclera: Conjunctivae normal.     Pupils: Pupils are equal, round, and reactive to light.  Cardiovascular:     Rate and Rhythm: Regular rhythm. Tachycardia present.     Pulses: Normal pulses.     Heart sounds: Normal heart sounds. No murmur heard.   Pulmonary:     Effort: Pulmonary effort is normal.     Breath sounds: Normal breath sounds. No wheezing, rhonchi or rales.  Musculoskeletal:        General: Normal range of motion.     Right lower leg: No  edema.     Left lower leg: No edema.  Skin:    General: Skin is warm and dry.     Findings: No rash.  Neurological:     General: No focal deficit present.     Mental Status: He is alert and oriented to person, place, and time.     Cranial Nerves: No cranial nerve deficit.  Psychiatric:        Mood and Affect: Mood normal.        Behavior: Behavior normal.        Thought Content: Thought content normal.        Judgment: Judgment normal.      Assessment and Plan   1. Diabetes mellitus without complication ([place]) - POCT glycosylated hemoglobin (Hb A1C)  2. Encounter for long-term opiate analgesic use - ToxASSURE Select 13 (MW), Urine - morphine (MS CONTIN) 30 MG 12 hr tablet; Take 1 tablet (30 mg total) by mouth 2 (two) times daily. May fill 60 days after 09/30/2017  Dispense: 60 tablet; Refill: 0 - Oxycodone HCl 10 MG TABS; Take 1 tablet by mouth 4 times a day as needed for pain. Do not drive or operate machinery while on this medicine.  Dispense: 120 tablet; Refill: 0  3. Chronic pain syndrome - ToxASSURE Select 13 (MW), Urine - morphine (MS CONTIN) 30 MG 12 hr tablet; Take 1 tablet (30 mg total) by mouth 2 (two) times daily. May fill 60 days after 09/30/2017  Dispense: 60 tablet; Refill: 0 - Oxycodone HCl 10 MG TABS; Take 1 tablet by mouth 4 times a day as needed for pain. Do not drive or operate machinery while on this medicine.  Dispense: 120 tablet; Refill: 0  4. Chronic nonintractable headache, unspecified headache type - ToxASSURE Select 13 (MW), Urine - morphine (MS CONTIN) 30 MG 12 hr tablet; Take 1 tablet (30 mg total) by mouth 2 (two) times daily. May fill 60 days after 09/30/2017  Dispense: 60 tablet; Refill: 0 - Oxycodone HCl 10 MG TABS; Take 1 tablet by mouth 4 times a day as needed for pain. Do not drive or operate machinery while on this medicine.  Dispense: 120 tablet; Refill: 0  5. History  of fusion of cervical spine - ToxASSURE Select 13 (MW), Urine - morphine  (MS CONTIN) 30 MG 12 hr tablet; Take 1 tablet (30 mg total) by mouth 2 (two) times daily. May fill 60 days after 09/30/2017  Dispense: 60 tablet; Refill: 0 - Oxycodone HCl 10 MG TABS; Take 1 tablet by mouth 4 times a day as needed for pain. Do not drive or operate machinery while on this medicine.  Dispense: 120 tablet; Refill: 0    Last script for chronic pain and xanax (already on file with refills till 8/21).  Pt given referral to pain management and psychiatry May 20th, 2021, was filed 2 days ago. Pt given numbers to contact himself.  Needs to keep the appts. Pt in agreement with plan.  Copd- seen by pulm in past.  Last pcp refilled his inhalers.  F/u [place] prn for DM2, HLD, copd  Results for orders placed or performed in visit on 12/29/19  POCT glycosylated hemoglobin (Hb A1C)  Result Value Ref Range   Hemoglobin A1C 5.9 (A) 4.0 - 5.6 %   HbA1c POC (<> result, manual entry)     HbA1c, POC (prediabetic range)     HbA1c, POC (controlled diabetic range)

## 2020-01-03 ENCOUNTER — Other Ambulatory Visit: Payer: Self-pay | Admitting: Family Medicine

## 2020-01-03 LAB — TOXASSURE SELECT 13 (MW), URINE

## 2020-01-14 ENCOUNTER — Other Ambulatory Visit: Payer: Self-pay | Admitting: Family Medicine

## 2020-03-24 ENCOUNTER — Telehealth: Payer: Self-pay | Admitting: Family Medicine

## 2020-03-24 NOTE — Telephone Encounter (Signed)
error 

## 2020-03-28 ENCOUNTER — Ambulatory Visit (INDEPENDENT_AMBULATORY_CARE_PROVIDER_SITE_OTHER): Payer: Medicare Other | Admitting: Family Medicine

## 2020-03-28 ENCOUNTER — Encounter: Payer: Self-pay | Admitting: Family Medicine

## 2020-03-28 ENCOUNTER — Other Ambulatory Visit: Payer: Self-pay

## 2020-03-28 VITALS — BP 122/82 | HR 88 | Temp 97.1°F | Ht 67.0 in | Wt 218.4 lb

## 2020-03-28 DIAGNOSIS — E119 Type 2 diabetes mellitus without complications: Secondary | ICD-10-CM

## 2020-03-28 DIAGNOSIS — F419 Anxiety disorder, unspecified: Secondary | ICD-10-CM

## 2020-03-28 DIAGNOSIS — G894 Chronic pain syndrome: Secondary | ICD-10-CM

## 2020-03-28 DIAGNOSIS — I10 Essential (primary) hypertension: Secondary | ICD-10-CM

## 2020-03-28 DIAGNOSIS — K5120 Ulcerative (chronic) proctitis without complications: Secondary | ICD-10-CM

## 2020-03-28 DIAGNOSIS — Z794 Long term (current) use of insulin: Secondary | ICD-10-CM

## 2020-03-28 MED ORDER — FLUTICASONE-SALMETEROL 250-50 MCG/DOSE IN AEPB: INHALATION_SPRAY | RESPIRATORY_TRACT | 1 refills | Status: DC

## 2020-03-28 MED ORDER — ATORVASTATIN CALCIUM 20 MG PO TABS: ORAL_TABLET | ORAL | 1 refills | Status: DC

## 2020-03-28 MED ORDER — LEVETIRACETAM 500 MG PO TABS
1500.0000 mg | ORAL_TABLET | Freq: Two times a day (BID) | ORAL | 1 refills | Status: DC
Start: 2020-03-28 — End: 2020-07-17

## 2020-03-28 MED ORDER — NIACIN ER (ANTIHYPERLIPIDEMIC) 1000 MG PO TBCR: EXTENDED_RELEASE_TABLET | ORAL | 1 refills | Status: DC

## 2020-03-28 MED ORDER — "INSULIN SYRINGE-NEEDLE U-100 31G X 5/16"" 1 ML MISC": 5 refills | Status: DC

## 2020-03-28 MED ORDER — ALBUTEROL SULFATE HFA 108 (90 BASE) MCG/ACT IN AERS: INHALATION_SPRAY | RESPIRATORY_TRACT | 5 refills | Status: DC

## 2020-03-28 MED ORDER — ENALAPRIL MALEATE 20 MG PO TABS: ORAL_TABLET | ORAL | 1 refills | Status: DC

## 2020-03-28 MED ORDER — ALPRAZOLAM 1 MG PO TABS
1.0000 mg | ORAL_TABLET | Freq: Two times a day (BID) | ORAL | 0 refills | Status: DC | PRN
Start: 2020-03-28 — End: 2020-05-05

## 2020-03-28 MED ORDER — INSULIN DETEMIR 100 UNIT/ML ~~LOC~~ SOLN: SUBCUTANEOUS | 5 refills | Status: DC

## 2020-03-28 MED ORDER — INSULIN ASPART 100 UNIT/ML ~~LOC~~ SOLN
SUBCUTANEOUS | 5 refills | Status: DC
Start: 2020-03-28 — End: 2020-09-27

## 2020-03-28 MED ORDER — METFORMIN HCL 500 MG PO TABS: ORAL_TABLET | ORAL | 1 refills | Status: DC

## 2020-03-28 MED ORDER — BUPROPION HCL ER (SR) 150 MG PO TB12
150.0000 mg | ORAL_TABLET | Freq: Two times a day (BID) | ORAL | 1 refills | Status: DC
Start: 2020-03-28 — End: 2020-06-28

## 2020-03-28 MED ORDER — FUROSEMIDE 20 MG PO TABS: 20.0000 mg | ORAL_TABLET | Freq: Every day | ORAL | 1 refills | Status: DC

## 2020-03-28 NOTE — Progress Notes (Addendum)
 Patient ID: Shaun Harris, male    DOB: 03/19/1966, 54 y.o.   MRN: [id number]   Chief Complaint  Patient presents with  . Diabetes    follow up- needs refills on all chronic meds   Subjective:    HPI  F/u for DM2, hld, depression/anxiety.  Pt is doing ok and dealing with the recent death of his wife. Waiting on death certificate with wife. Dealing with bills and banks. Recent death of wife this past year.  Pt stating wanting to taper off the xanax.  Was on bid. And discussed trying just 1/2 tab bid. Or 1 tab per day.  Has small chihuahua that has as pet.  Feeling like things are getting a little better with depression/anxiety. Taking wellbutrin and cymbalta, xanax prn.  Dm2- Compliant with medications. Checking blood glucose.   Not seeing any high or low numbers.  Denies polyuria or polydipsia.  Eye exam: due for eye exam. Foot exam: no concerns  Seeing [facility] for chronic pain syndrome.  Medical History Shaun Harris has a past medical history of Anxiety, Arthritis, Cerebral vasculitis, Chronic headaches, COPD (chronic obstructive pulmonary disease) ([facility]), COPD (chronic obstructive pulmonary disease) ([facility]), Diabetes mellitus, Hyperlipidemia, Hypertension, Hypertriglyceridemia, Sleep apnea, Stroke ([facility]), Tobacco abuse, Ulcerative colitis ([facility]), Ulcerative colitis ([facility]), and Venous stasis.   Outpatient Encounter Medications as of 03/28/2020  Medication Sig  . albuterol (VENTOLIN HFA) 108 (90 Base) MCG/ACT inhaler INHALE 2 PUFFS BY MOUTH EVERY 6 HOURS AS NEEDED FOR SHORTNESS OF BREAT H  . Alcohol Swabs (PHARMACIST CHOICE ALCOHOL) PADS Reported on 01/23/2016  . ALPRAZolam (XANAX) 1 MG tablet Take 1 tablet (1 mg total) by mouth 2 (two) times daily as needed. Per psychiatry  . aspirin EC 325 MG tablet Take 325 mg by mouth every morning. Reported on 01/23/2016  . atorvastatin (LIPITOR) 20 MG tablet TAKE 1 TABLET BY MOUTH AT BEDTIME FOR CHOLESTEROL  . Blood Glucose  Monitoring Suppl w/Device KIT Test glucose 4 times daily. E11.9  . buPROPion (WELLBUTRIN SR) 150 MG 12 hr tablet Take 1 tablet (150 mg total) by mouth 2 (two) times daily.  . enalapril (VASOTEC) 20 MG tablet TAKE (2) TABLETS BY MOUTH ONCE DAILY  . Fluticasone-Salmeterol (ADVAIR DISKUS) 250-50 MCG/DOSE AEPB USE 1 INHALATION BY MOUTH EVERY 12 HOURS. <<RINSE MOUTH AFTER USE>>.  . furosemide (LASIX) 20 MG tablet Take 1 tablet (20 mg total) by mouth daily.  . hydrocortisone (ANUSOL-HC) 2.5 % rectal cream Place 1 application rectally 2 (two) times daily.  . hyoscyamine (LEVSIN SL) 0.125 MG SL tablet TAKE (1) TABLET UNDER TONGUE EVERY FOUR HOURS AS NEEDED FOR CRAMPING.  . insulin aspart (NOVOLOG) 100 UNIT/ML injection INJECT S.Q. 4 TO 12 UNITS FOUR TIMES DAILY PER SLIDING SCALE.  . insulin detemir (LEVEMIR) 100 UNIT/ML injection INJECT 40 UNITS SUBCUTANEOUSLY TWICE DAILY.  . Insulin Syringe-Needle U-100 (TRUEPLUS INSULIN SYRINGE) 31G X 5/16" 1 ML MISC USE AS DIRECTED  . levETIRAcetam (KEPPRA) 500 MG tablet Take 3 tablets (1,500 mg total) by mouth 2 (two) times daily.  . metFORMIN (GLUCOPHAGE) 500 MG tablet TAKE (2) TABLETS QAM AND ONE TABLET IN THE EVENINGS  . morphine (MS CONTIN) 30 MG 12 hr tablet Take 1 tablet (30 mg total) by mouth 2 (two) times daily. May fill 60 days after 09/30/2017  . Multiple Vitamins-Minerals (CENTRUM PO) Take 1 tablet by mouth daily. Reported on 01/23/2016  . niacin (NIASPAN) 1000 MG CR tablet TAKE (2) TABLETS BY MOUTH ONCE DAILY  . Oxycodone   HCl 10 MG TABS Take 1 tablet by mouth 4 times a day as needed for pain. Do not drive or operate machinery while on this medicine.  . OZEMPIC 0.25 or 0.5 MG/DOSE SOPN   . terazosin (HYTRIN) 10 MG capsule TAKE (1) CAPSULE BY MOUTH ONCE DAILY.  . TRUE METRIX BLOOD GLUCOSE TEST test strip CHECK BLOOD SUGAR 4 TIMES DAILY.  . vitamin C (ASCORBIC ACID) 500 MG tablet Take 500 mg by mouth daily. Reported on 01/23/2016  . [DISCONTINUED] albuterol  (VENTOLIN HFA) 108 (90 Base) MCG/ACT inhaler INHALE 2 PUFFS BY MOUTH EVERY 6 HOURS AS NEEDED FOR SHORTNESS OF BREAT H  . [DISCONTINUED] ALPRAZolam (XANAX) 1 MG tablet Take 1 tablet (1 mg total) by mouth 2 (two) times daily as needed.  . [DISCONTINUED] atorvastatin (LIPITOR) 20 MG tablet TAKE 1 TABLET BY MOUTH AT BEDTIME FOR CHOLESTEROL  . [DISCONTINUED] buPROPion (WELLBUTRIN SR) 150 MG 12 hr tablet TAKE 1 TABLET BY MOUTH TWICE DAILY  . [DISCONTINUED] enalapril (VASOTEC) 20 MG tablet TAKE (2) TABLETS BY MOUTH ONCE DAILY.  . [DISCONTINUED] Fluticasone-Salmeterol (ADVAIR DISKUS) 250-50 MCG/DOSE AEPB USE 1 INHALATION BY MOUTH EVERY 12 HOURS. <<RINSE MOUTH AFTER USE>>.  . [DISCONTINUED] furosemide (LASIX) 20 MG tablet Take 1 tablet (20 mg total) by mouth daily.  . [DISCONTINUED] insulin aspart (NOVOLOG) 100 UNIT/ML injection INJECT S.Q. 4 TO 12 UNITS FOUR TIMES DAILY PER SLIDING SCALE.  . [DISCONTINUED] insulin detemir (LEVEMIR) 100 UNIT/ML injection INJECT 40 UNITS SUBCUTANEOUSLY TWICE DAILY.  . [DISCONTINUED] Insulin Syringe-Needle U-100 (TRUEPLUS INSULIN SYRINGE) 31G X 5/16" 1 ML MISC USE AS DIRECTED  . [DISCONTINUED] levETIRAcetam (KEPPRA) 500 MG tablet TAKE 3 TABLETS BY MOUTH TWICE DAILY  . [DISCONTINUED] Mesalamine (ASACOL) 400 MG CPDR DR capsule TAKE (1) CAPSULE BY MOUTH FOUR TIMES DAILY  . [DISCONTINUED] metFORMIN (GLUCOPHAGE) 500 MG tablet TAKE (2) TABLETS QAM AND ONE TABLET IN THE EVENINGS  . [DISCONTINUED] naproxen (NAPROSYN) 500 MG tablet TAKE (1) TABLET TWICE A DAY WITH FOOD---BREAKFAST AND SUPPER.  . [DISCONTINUED] niacin (NIASPAN) 1000 MG CR tablet TAKE (2) TABLETS BY MOUTH ONCE DAILY.  . [DISCONTINUED] tiZANidine (ZANAFLEX) 4 MG tablet TAKE 1 TABLET THREE TIMES DAILY AS NEEDED FOR MUSCLE SPASM   No facility-administered encounter medications on file as of 03/28/2020.     Review of Systems  Constitutional: Negative for chills and fever.  HENT: Negative for congestion, rhinorrhea and  sore throat.   Respiratory: Negative for cough, shortness of breath and wheezing.   Cardiovascular: Negative for chest pain and leg swelling.  Gastrointestinal: Negative for abdominal pain, diarrhea, nausea and vomiting.  Genitourinary: Negative for dysuria and frequency.  Skin: Negative for rash.  Neurological: Negative for dizziness, weakness and headaches.  Psychiatric/Behavioral: Negative for dysphoric mood and sleep disturbance. The patient is not nervous/anxious.      Vitals BP 122/82   Pulse 88   Temp (!) 97.1 F (36.2 C) (Oral)   Ht 5' 7" (1.702 m)   Wt 218 lb 6.4 oz (99.1 kg)   SpO2 99%   BMI 34.21 kg/m   Objective:   Physical Exam Vitals and nursing note reviewed.  Constitutional:      General: He is not in acute distress.    Appearance: Normal appearance. He is not ill-appearing.  HENT:     Head: Normocephalic.     Nose: Nose normal. No congestion.     Mouth/Throat:     Mouth: Mucous membranes are moist.     Pharynx: No oropharyngeal exudate.    Eyes:     Extraocular Movements: Extraocular movements intact.     Conjunctiva/sclera: Conjunctivae normal.     Pupils: Pupils are equal, round, and reactive to light.  Cardiovascular:     Rate and Rhythm: Normal rate and regular rhythm.     Pulses: Normal pulses.     Heart sounds: Normal heart sounds. No murmur heard.   Pulmonary:     Effort: Pulmonary effort is normal.     Breath sounds: Normal breath sounds. No wheezing, rhonchi or rales.  Musculoskeletal:        General: Normal range of motion.     Right lower leg: No edema.     Left lower leg: No edema.  Skin:    General: Skin is warm and dry.     Findings: No rash.  Neurological:     General: No focal deficit present.     Mental Status: He is alert and oriented to person, place, and time.     Cranial Nerves: No cranial nerve deficit.  Psychiatric:        Mood and Affect: Mood normal.        Behavior: Behavior normal.        Thought Content: Thought  content normal.        Judgment: Judgment normal.      Assessment and Plan   1. Type 2 diabetes mellitus without complication, with long-term current use of insulin ([facility]) - atorvastatin (LIPITOR) 20 MG tablet; TAKE 1 TABLET BY MOUTH AT BEDTIME FOR CHOLESTEROL  Dispense: 90 tablet; Refill: 1 - CBC - CMP14+EGFR - Lipid panel - Urine Microalbumin w/creat. ratio  2. Essential hypertension  3. Morbid obesity (HCC)  4. Chronic pain syndrome  5. Anxiety  6. Ulcerative proctitis without complication ([facility])   DM2- a1c at 5.9, improved. Cont meds. HLD- stable, cont meds.- lipitor Obesity- pt seeing weight loss clinic. Cont ozempic. Chronic pain- [facility] for pain medications and anxiety medications. Depression/anxiety- stable. Improving.  Cont wellbutrin H/o seizures- stable, cont keppra. Ulcerative colitis- stable, cont mesalamine.  F/u 3mo or prn.  

## 2020-03-29 ENCOUNTER — Other Ambulatory Visit: Payer: Self-pay | Admitting: *Deleted

## 2020-03-29 LAB — CMP14+EGFR
ALT: 14 IU/L (ref 0–44)
AST: 16 IU/L (ref 0–40)
Albumin/Globulin Ratio: 2.5 — ABNORMAL HIGH (ref 1.2–2.2)
Albumin: 4.9 g/dL (ref 3.8–4.9)
Alkaline Phosphatase: 75 IU/L (ref 44–121)
BUN/Creatinine Ratio: 21 — ABNORMAL HIGH (ref 9–20)
BUN: 21 mg/dL (ref 6–24)
Bilirubin Total: 0.2 mg/dL (ref 0.0–1.2)
CO2: 21 mmol/L (ref 20–29)
Calcium: 9.4 mg/dL (ref 8.7–10.2)
Chloride: 105 mmol/L (ref 96–106)
Creatinine, Ser: 1 mg/dL (ref 0.76–1.27)
GFR calc Af Amer: 98 mL/min/{1.73_m2} (ref >59)
GFR calc non Af Amer: 85 mL/min/{1.73_m2} (ref >59)
Globulin, Total: 2 g/dL (ref 1.5–4.5)
Glucose: 189 mg/dL — ABNORMAL HIGH (ref 65–99)
Potassium: 4.8 mmol/L (ref 3.5–5.2)
Sodium: 143 mmol/L (ref 134–144)
Total Protein: 6.9 g/dL (ref 6.0–8.5)

## 2020-03-29 LAB — LIPID PANEL
Chol/HDL Ratio: 3.1 ratio (ref 0.0–5.0)
Cholesterol, Total: 120 mg/dL (ref 100–199)
HDL: 39 mg/dL — ABNORMAL LOW (ref >39)
LDL Chol Calc (NIH): 51 mg/dL (ref 0–99)
Triglycerides: 183 mg/dL — ABNORMAL HIGH (ref 0–149)
VLDL Cholesterol Cal: 30 mg/dL (ref 5–40)

## 2020-03-29 LAB — CBC
Hematocrit: 38.1 % (ref 37.5–51.0)
Hemoglobin: 12.9 g/dL — ABNORMAL LOW (ref 13.0–17.7)
MCH: 31.2 pg (ref 26.6–33.0)
MCHC: 33.9 g/dL (ref 31.5–35.7)
MCV: 92 fL (ref 79–97)
Platelets: 195 10*3/uL (ref 150–450)
RBC: 4.13 x10E6/uL — ABNORMAL LOW (ref 4.14–5.80)
RDW: 12.5 % (ref 11.6–15.4)
WBC: 6.9 10*3/uL (ref 3.4–10.8)

## 2020-03-29 LAB — MICROALBUMIN / CREATININE URINE RATIO
Creatinine, Urine: 160.3 mg/dL
Microalb/Creat Ratio: 7 mg/g creat (ref 0–29)
Microalbumin, Urine: 11.3 ug/mL

## 2020-03-30 ENCOUNTER — Other Ambulatory Visit: Payer: Self-pay | Admitting: *Deleted

## 2020-03-30 MED ORDER — NAPROXEN 500 MG PO TABS
ORAL_TABLET | ORAL | 5 refills | Status: DC
Start: 2020-03-30 — End: 2020-09-27

## 2020-03-30 MED ORDER — TIZANIDINE HCL 4 MG PO TABS
ORAL_TABLET | ORAL | 0 refills | Status: DC
Start: 2020-03-30 — End: 2020-05-05

## 2020-03-30 MED ORDER — MESALAMINE 400 MG PO CPDR
DELAYED_RELEASE_CAPSULE | ORAL | 0 refills | Status: DC
Start: 2020-03-30 — End: 2020-05-05

## 2020-03-30 NOTE — Telephone Encounter (Signed)
Error

## 2020-04-03 NOTE — Progress Notes (Signed)
Patient ID: Shaun Harris, male    DOB: 05/10/1966, 54 y.o.   MRN: [id number]   Chief Complaint  Patient presents with  . Diabetes    follow up- needs refills on all chronic meds   Subjective:    HPI  F/u for DM2, hld, depression/anxiety.  Pt is doing ok and dealing with the recent death of his wife. Waiting on death certificate with wife. Dealing with bills and banks. Recent death of wife this past year.  Pt stating wanting to taper off the xanax.  Was on bid. And discussed trying just 1/2 tab bid. Or 1 tab per day.  Has small chihuahua that has as pet.  Feeling like things are getting a little better with depression/anxiety. Taking wellbutrin and cymbalta, xanax prn.  Dm2- Compliant with medications. Checking blood glucose.   Not seeing any high or low numbers.  Denies polyuria or polydipsia.  Eye exam: due for eye exam. Foot exam: no concerns  Seeing [facility] for chronic pain syndrome.  Medical History [place] has a past medical history of Anxiety, Arthritis, Cerebral vasculitis, Chronic headaches, COPD (chronic obstructive pulmonary disease) ([place]), COPD (chronic obstructive pulmonary disease) ([place]), Diabetes mellitus, Hyperlipidemia, Hypertension, Hypertriglyceridemia, Sleep apnea, Stroke ([place]), Tobacco abuse, Ulcerative colitis ([place]), Ulcerative colitis ([place]), and Venous stasis.   Outpatient Encounter Medications as of 03/28/2020  Medication Sig  . albuterol (VENTOLIN HFA) 108 (90 Base) MCG/ACT inhaler INHALE 2 PUFFS BY MOUTH EVERY 6 HOURS AS NEEDED FOR SHORTNESS OF BREAT H  . Alcohol Swabs (PHARMACIST CHOICE ALCOHOL) PADS Reported on 01/23/2016  . ALPRAZolam (XANAX) 1 MG tablet Take 1 tablet (1 mg total) by mouth 2 (two) times daily as needed. Per psychiatry  . aspirin EC 325 MG tablet Take 325 mg by mouth every morning. Reported on 01/23/2016  . atorvastatin (LIPITOR) 20 MG tablet TAKE 1 TABLET BY MOUTH AT BEDTIME FOR CHOLESTEROL  . Blood Glucose  Monitoring Suppl w/Device KIT Test glucose 4 times daily. E11.9  . buPROPion (WELLBUTRIN SR) 150 MG 12 hr tablet Take 1 tablet (150 mg total) by mouth 2 (two) times daily.  . enalapril (VASOTEC) 20 MG tablet TAKE (2) TABLETS BY MOUTH ONCE DAILY  . Fluticasone-Salmeterol (ADVAIR DISKUS) 250-50 MCG/DOSE AEPB USE 1 INHALATION BY MOUTH EVERY 12 HOURS. <<RINSE MOUTH AFTER USE>>.  . furosemide (LASIX) 20 MG tablet Take 1 tablet (20 mg total) by mouth daily.  . hydrocortisone (ANUSOL-HC) 2.5 % rectal cream Place 1 application rectally 2 (two) times daily.  . hyoscyamine (LEVSIN SL) 0.125 MG SL tablet TAKE (1) TABLET UNDER TONGUE EVERY FOUR HOURS AS NEEDED FOR CRAMPING.  . insulin aspart (NOVOLOG) 100 UNIT/ML injection INJECT S.Q. 4 TO 12 UNITS FOUR TIMES DAILY PER SLIDING SCALE.  . insulin detemir (LEVEMIR) 100 UNIT/ML injection INJECT 40 UNITS SUBCUTANEOUSLY TWICE DAILY.  Marland Kitchen Insulin Syringe-Needle U-100 (TRUEPLUS INSULIN SYRINGE) 31G X 5/16" 1 ML MISC USE AS DIRECTED  . levETIRAcetam (KEPPRA) 500 MG tablet Take 3 tablets (1,500 mg total) by mouth 2 (two) times daily.  . metFORMIN (GLUCOPHAGE) 500 MG tablet TAKE (2) TABLETS QAM AND ONE TABLET IN THE EVENINGS  . morphine (MS CONTIN) 30 MG 12 hr tablet Take 1 tablet (30 mg total) by mouth 2 (two) times daily. May fill 60 days after 09/30/2017  . Multiple Vitamins-Minerals (CENTRUM PO) Take 1 tablet by mouth daily. Reported on 01/23/2016  . niacin (NIASPAN) 1000 MG CR tablet TAKE (2) TABLETS BY MOUTH ONCE DAILY  . Oxycodone  HCl 10 MG TABS Take 1 tablet by mouth 4 times a day as needed for pain. Do not drive or operate machinery while on this medicine.  Marland Kitchen OZEMPIC 0.25 or 0.5 MG/DOSE SOPN   . terazosin (HYTRIN) 10 MG capsule TAKE (1) CAPSULE BY MOUTH ONCE DAILY.  Marland Kitchen TRUE METRIX BLOOD GLUCOSE TEST test strip CHECK BLOOD SUGAR 4 TIMES DAILY.  . vitamin C (ASCORBIC ACID) 500 MG tablet Take 500 mg by mouth daily. Reported on 01/23/2016  . [DISCONTINUED] albuterol  (VENTOLIN HFA) 108 (90 Base) MCG/ACT inhaler INHALE 2 PUFFS BY MOUTH EVERY 6 HOURS AS NEEDED FOR SHORTNESS OF BREAT H  . [DISCONTINUED] ALPRAZolam (XANAX) 1 MG tablet Take 1 tablet (1 mg total) by mouth 2 (two) times daily as needed.  . [DISCONTINUED] atorvastatin (LIPITOR) 20 MG tablet TAKE 1 TABLET BY MOUTH AT BEDTIME FOR CHOLESTEROL  . [DISCONTINUED] buPROPion (WELLBUTRIN SR) 150 MG 12 hr tablet TAKE 1 TABLET BY MOUTH TWICE DAILY  . [DISCONTINUED] enalapril (VASOTEC) 20 MG tablet TAKE (2) TABLETS BY MOUTH ONCE DAILY.  . [DISCONTINUED] Fluticasone-Salmeterol (ADVAIR DISKUS) 250-50 MCG/DOSE AEPB USE 1 INHALATION BY MOUTH EVERY 12 HOURS. <<RINSE MOUTH AFTER USE>>.  . [DISCONTINUED] furosemide (LASIX) 20 MG tablet Take 1 tablet (20 mg total) by mouth daily.  . [DISCONTINUED] insulin aspart (NOVOLOG) 100 UNIT/ML injection INJECT S.Q. 4 TO 12 UNITS FOUR TIMES DAILY PER SLIDING SCALE.  . [DISCONTINUED] insulin detemir (LEVEMIR) 100 UNIT/ML injection INJECT 40 UNITS SUBCUTANEOUSLY TWICE DAILY.  . [DISCONTINUED] Insulin Syringe-Needle U-100 (TRUEPLUS INSULIN SYRINGE) 31G X 5/16" 1 ML MISC USE AS DIRECTED  . [DISCONTINUED] levETIRAcetam (KEPPRA) 500 MG tablet TAKE 3 TABLETS BY MOUTH TWICE DAILY  . [DISCONTINUED] Mesalamine (ASACOL) 400 MG CPDR DR capsule TAKE (1) CAPSULE BY MOUTH FOUR TIMES DAILY  . [DISCONTINUED] metFORMIN (GLUCOPHAGE) 500 MG tablet TAKE (2) TABLETS QAM AND ONE TABLET IN THE EVENINGS  . [DISCONTINUED] naproxen (NAPROSYN) 500 MG tablet TAKE (1) TABLET TWICE A DAY WITH FOOD---BREAKFAST AND SUPPER.  . [DISCONTINUED] niacin (NIASPAN) 1000 MG CR tablet TAKE (2) TABLETS BY MOUTH ONCE DAILY.  . [DISCONTINUED] tiZANidine (ZANAFLEX) 4 MG tablet TAKE 1 TABLET THREE TIMES DAILY AS NEEDED FOR MUSCLE SPASM   No facility-administered encounter medications on file as of 03/28/2020.     Review of Systems  Constitutional: Negative for chills and fever.  HENT: Negative for congestion, rhinorrhea and  sore throat.   Respiratory: Negative for cough, shortness of breath and wheezing.   Cardiovascular: Negative for chest pain and leg swelling.  Gastrointestinal: Negative for abdominal pain, diarrhea, nausea and vomiting.  Genitourinary: Negative for dysuria and frequency.  Skin: Negative for rash.  Neurological: Negative for dizziness, weakness and headaches.  Psychiatric/Behavioral: Negative for dysphoric mood and sleep disturbance. The patient is not nervous/anxious.      Vitals BP 122/82   Pulse 88   Temp (!) 97.1 F (36.2 C) (Oral)   Ht _0  (1.702 m)   Wt 218 lb 6.4 oz (99.1 kg)   SpO2 99%   BMI 34.21 kg/m   Objective:   Physical Exam Vitals and nursing note reviewed.  Constitutional:      General: He is not in acute distress.    Appearance: Normal appearance. He is not ill-appearing.  HENT:     Head: Normocephalic.     Nose: Nose normal. No congestion.     Mouth/Throat:     Mouth: Mucous membranes are moist.     Pharynx: No oropharyngeal exudate.  Eyes:     Extraocular Movements: Extraocular movements intact.     Conjunctiva/sclera: Conjunctivae normal.     Pupils: Pupils are equal, round, and reactive to light.  Cardiovascular:     Rate and Rhythm: Normal rate and regular rhythm.     Pulses: Normal pulses.     Heart sounds: Normal heart sounds. No murmur heard.   Pulmonary:     Effort: Pulmonary effort is normal.     Breath sounds: Normal breath sounds. No wheezing, rhonchi or rales.  Musculoskeletal:        General: Normal range of motion.     Right lower leg: No edema.     Left lower leg: No edema.  Skin:    General: Skin is warm and dry.     Findings: No rash.  Neurological:     General: No focal deficit present.     Mental Status: He is alert and oriented to person, place, and time.     Cranial Nerves: No cranial nerve deficit.  Psychiatric:        Mood and Affect: Mood normal.        Behavior: Behavior normal.        Thought Content: Thought  content normal.        Judgment: Judgment normal.      Assessment and Plan   1. Type 2 diabetes mellitus without complication, with long-term current use of insulin (HCC) - atorvastatin (LIPITOR) 20 MG tablet; TAKE 1 TABLET BY MOUTH AT BEDTIME FOR CHOLESTEROL  Dispense: 90 tablet; Refill: 1 - CBC - CMP14+EGFR - Lipid panel - Urine Microalbumin w/creat. ratio  2. Essential hypertension  3. Morbid obesity ([place])  4. Chronic pain syndrome  5. Anxiety  6. Ulcerative proctitis without complication (HCC)   DM2- a1c at 5.9, improved. Cont meds. HLD- stable, cont meds.- lipitor Obesity- pt seeing weight loss clinic. Cont ozempic. Chronic pain- [facility] for pain medications and anxiety medications. Depression/anxiety- stable. Improving.  Cont wellbutrin H/o seizures- stable, cont keppra. Ulcerative colitis- stable, cont mesalamine.  F/u [place] prn.

## 2020-04-18 ENCOUNTER — Telehealth: Payer: Self-pay | Admitting: Family Medicine

## 2020-04-18 NOTE — Telephone Encounter (Signed)
[facility] requesting refill on Terazosin 10 mg capsule. Take one capsule po daily. Pt last seen 03/28/20 for DM. Please advise. Thank you

## 2020-04-19 MED ORDER — TERAZOSIN HCL 10 MG PO CAPS
ORAL_CAPSULE | ORAL | 0 refills | Status: DC
Start: 2020-04-19 — End: 2020-07-17

## 2020-04-19 NOTE — Addendum Note (Signed)
Addended by: Erven Colla on: 04/19/2020 10:46 AM   Modules accepted: Orders

## 2020-05-03 ENCOUNTER — Telehealth: Payer: Self-pay | Admitting: Family Medicine

## 2020-05-03 NOTE — Telephone Encounter (Signed)
[facility] requesting refill on Mesalamine 400mg  Capsules and Tizanidine 4 mg tablets. Pt last seen 03/28/20 for DM. Please advise. Thank you

## 2020-05-05 ENCOUNTER — Other Ambulatory Visit: Payer: Self-pay | Admitting: Family Medicine

## 2020-05-05 MED ORDER — TIZANIDINE HCL 4 MG PO TABS: ORAL_TABLET | ORAL | 0 refills | Status: DC

## 2020-05-05 MED ORDER — ALPRAZOLAM 1 MG PO TABS
1.0000 mg | ORAL_TABLET | Freq: Two times a day (BID) | ORAL | 0 refills | Status: DC | PRN
Start: 2020-05-05 — End: 2020-08-09

## 2020-05-05 MED ORDER — ALBUTEROL SULFATE HFA 108 (90 BASE) MCG/ACT IN AERS
INHALATION_SPRAY | RESPIRATORY_TRACT | 5 refills | Status: DC
Start: 2020-05-05 — End: 2020-11-28

## 2020-05-05 MED ORDER — MESALAMINE 400 MG PO CPDR
DELAYED_RELEASE_CAPSULE | ORAL | 2 refills | Status: DC
Start: 2020-05-05 — End: 2020-09-06

## 2020-06-27 ENCOUNTER — Ambulatory Visit: Payer: Medicare Other | Admitting: Family Medicine

## 2020-06-27 ENCOUNTER — Other Ambulatory Visit: Payer: Self-pay | Admitting: Family Medicine

## 2020-07-17 ENCOUNTER — Other Ambulatory Visit: Payer: Self-pay | Admitting: Family Medicine

## 2020-07-17 DIAGNOSIS — E119 Type 2 diabetes mellitus without complications: Secondary | ICD-10-CM

## 2020-07-17 DIAGNOSIS — Z794 Long term (current) use of insulin: Secondary | ICD-10-CM

## 2020-07-21 ENCOUNTER — Other Ambulatory Visit: Payer: Self-pay

## 2020-07-21 ENCOUNTER — Encounter (HOSPITAL_COMMUNITY): Payer: Self-pay

## 2020-07-21 ENCOUNTER — Emergency Department (HOSPITAL_COMMUNITY)
Admission: EM | Admit: 2020-07-21 | Discharge: 2020-07-22 | Disposition: A | Discharge status: Home / Self Care | Payer: Medicare Other | Attending: Emergency Medicine | Admitting: Emergency Medicine

## 2020-07-21 DIAGNOSIS — F17210 Nicotine dependence, cigarettes, uncomplicated: Secondary | ICD-10-CM | POA: Diagnosis not present

## 2020-07-21 DIAGNOSIS — N201 Calculus of ureter: Secondary | ICD-10-CM | POA: Diagnosis not present

## 2020-07-21 DIAGNOSIS — I10 Essential (primary) hypertension: Secondary | ICD-10-CM | POA: Diagnosis not present

## 2020-07-21 DIAGNOSIS — Z7982 Long term (current) use of aspirin: Secondary | ICD-10-CM | POA: Insufficient documentation

## 2020-07-21 DIAGNOSIS — Z7984 Long term (current) use of oral hypoglycemic drugs: Secondary | ICD-10-CM | POA: Insufficient documentation

## 2020-07-21 DIAGNOSIS — Z794 Long term (current) use of insulin: Secondary | ICD-10-CM | POA: Diagnosis not present

## 2020-07-21 DIAGNOSIS — B029 Zoster without complications: Secondary | ICD-10-CM

## 2020-07-21 DIAGNOSIS — Z79899 Other long term (current) drug therapy: Secondary | ICD-10-CM | POA: Diagnosis not present

## 2020-07-21 DIAGNOSIS — J449 Chronic obstructive pulmonary disease, unspecified: Secondary | ICD-10-CM | POA: Insufficient documentation

## 2020-07-21 DIAGNOSIS — Z7951 Long term (current) use of inhaled steroids: Secondary | ICD-10-CM | POA: Diagnosis not present

## 2020-07-21 DIAGNOSIS — R1032 Left lower quadrant pain: Secondary | ICD-10-CM | POA: Diagnosis present

## 2020-07-21 DIAGNOSIS — E119 Type 2 diabetes mellitus without complications: Secondary | ICD-10-CM | POA: Diagnosis not present

## 2020-07-21 NOTE — ED Triage Notes (Signed)
Pt to er, pt states that his L side is sore, states that even his shirt rubbing on him causes pain.  Pt states that he has a hx of ulcerative colitis, states that he normally has 4 bm a day, states that Tuesday he was very constipated, states that since then he has been having a fever and vomiting and abd pain

## 2020-07-22 ENCOUNTER — Emergency Department (HOSPITAL_COMMUNITY): Payer: Medicare Other

## 2020-07-22 LAB — CBC WITH DIFFERENTIAL/PLATELET
Abs Immature Granulocytes: 0.03 10*3/uL (ref 0.00–0.07)
Basophils Absolute: 0.1 10*3/uL (ref 0.0–0.1)
Basophils Relative: 1 %
Eosinophils Absolute: 0.1 10*3/uL (ref 0.0–0.5)
Eosinophils Relative: 2 %
HCT: 43.4 % (ref 39.0–52.0)
Hemoglobin: 14.2 g/dL (ref 13.0–17.0)
Immature Granulocytes: 0 %
Lymphocytes Relative: 24 %
Lymphs Abs: 1.7 10*3/uL (ref 0.7–4.0)
MCH: 30.7 pg (ref 26.0–34.0)
MCHC: 32.7 g/dL (ref 30.0–36.0)
MCV: 93.7 fL (ref 80.0–100.0)
Monocytes Absolute: 0.6 10*3/uL (ref 0.1–1.0)
Monocytes Relative: 9 %
Neutro Abs: 4.6 10*3/uL (ref 1.7–7.7)
Neutrophils Relative %: 64 %
Platelets: 226 10*3/uL (ref 150–400)
RBC: 4.63 MIL/uL (ref 4.22–5.81)
RDW: 11.8 % (ref 11.5–15.5)
WBC: 7.2 10*3/uL (ref 4.0–10.5)
nRBC: 0 % (ref 0.0–0.2)

## 2020-07-22 LAB — URINALYSIS, MICROSCOPIC (REFLEX)
Bacteria, UA: NONE SEEN
RBC / HPF: NONE SEEN RBC/hpf (ref 0–5)
Squamous Epithelial / HPF: NONE SEEN (ref 0–5)
WBC, UA: NONE SEEN WBC/hpf (ref 0–5)

## 2020-07-22 LAB — COMPREHENSIVE METABOLIC PANEL
ALT: 15 U/L (ref 0–44)
AST: 16 U/L (ref 15–41)
Albumin: 4.9 g/dL (ref 3.5–5.0)
Alkaline Phosphatase: 68 U/L (ref 38–126)
Anion gap: 15 (ref 5–15)
BUN: 18 mg/dL (ref 6–20)
CO2: 23 mmol/L (ref 22–32)
Calcium: 10.3 mg/dL (ref 8.9–10.3)
Chloride: 100 mmol/L (ref 98–111)
Creatinine, Ser: 1.12 mg/dL (ref 0.61–1.24)
GFR, Estimated: >60 mL/min (ref >60)
Glucose, Bld: 182 mg/dL — ABNORMAL HIGH (ref 70–99)
Potassium: 4.1 mmol/L (ref 3.5–5.1)
Sodium: 138 mmol/L (ref 135–145)
Total Bilirubin: 1 mg/dL (ref 0.3–1.2)
Total Protein: 7.8 g/dL (ref 6.5–8.1)

## 2020-07-22 LAB — URINALYSIS, ROUTINE W REFLEX MICROSCOPIC
Bilirubin Urine: NEGATIVE
Glucose, UA: NEGATIVE mg/dL
Ketones, ur: >80 mg/dL — AB
Leukocytes,Ua: NEGATIVE
Nitrite: NEGATIVE
Protein, ur: NEGATIVE mg/dL
Specific Gravity, Urine: 1.02 (ref 1.005–1.030)
pH: 6 (ref 5.0–8.0)

## 2020-07-22 LAB — LIPASE, BLOOD: Lipase: 27 U/L (ref 11–51)

## 2020-07-22 MED ORDER — VALACYCLOVIR HCL 1 G PO TABS: 1000.0000 mg | ORAL_TABLET | Freq: Three times a day (TID) | ORAL | 0 refills | Status: AC

## 2020-07-22 MED ORDER — KETOROLAC TROMETHAMINE 30 MG/ML IJ SOLN
30.0000 mg | Freq: Once | INTRAMUSCULAR | Status: AC
  Administered 2020-07-22: 30 mg via INTRAVENOUS
  Filled 2020-07-22: qty 1

## 2020-07-22 MED ORDER — IOHEXOL 9 MG/ML PO SOLN
ORAL | Status: AC
  Filled 2020-07-22: qty 1000

## 2020-07-22 MED ORDER — LACTATED RINGERS IV BOLUS
2000.0000 mL | Freq: Once | INTRAVENOUS | Status: AC
  Administered 2020-07-22: 2000 mL via INTRAVENOUS

## 2020-07-22 MED ORDER — ONDANSETRON HCL 4 MG/2ML IJ SOLN
4.0000 mg | Freq: Once | INTRAMUSCULAR | Status: AC
  Administered 2020-07-22: 4 mg via INTRAVENOUS
  Filled 2020-07-22: qty 2

## 2020-07-22 MED ORDER — FENTANYL CITRATE (PF) 100 MCG/2ML IJ SOLN
100.0000 ug | Freq: Once | INTRAMUSCULAR | Status: AC
  Administered 2020-07-22: 100 ug via INTRAVENOUS
  Filled 2020-07-22: qty 2

## 2020-07-22 MED ORDER — IOHEXOL 300 MG/ML  SOLN
100.0000 mL | Freq: Once | INTRAMUSCULAR | Status: AC | PRN
  Administered 2020-07-22: 100 mL via INTRAVENOUS

## 2020-07-22 NOTE — ED Notes (Signed)
Patient given a urinal and specimen requested from patient.  He states he feels he does not have to urinate at this time.  Patient also complains of nausea without vomiting.

## 2020-07-22 NOTE — ED Provider Notes (Signed)
[facility] EMERGENCY DEPARTMENT Provider Note   CSN: [id number] Arrival date & time: 07/21/20  2025     History Chief Complaint  Patient presents with  . Abdominal Pain    Shaun Harris is a 55 y.o. male.  The history is provided by the patient.  Abdominal Pain Pain location:  LUQ and LLQ Pain quality: aching and burning   Pain severity:  Moderate Onset quality:  Gradual Duration:  3 days Timing:  Intermittent Progression:  Unchanged Chronicity:  New Relieved by:  Nothing Worsened by:  Nothing Associated symptoms: chills, constipation, fever, nausea and vomiting   Associated symptoms: no chest pain, no hematemesis and no hematochezia   Patient with multiple medical conditions including ulcerative colitis.  This is been well controlled he typically has up to 4 nonbloody bowel movements per day.  He reports he has been constipated for least 3 days.  He reports associated nausea and vomiting.  He also reports there is a burning-like sensation in the left side of his abdomen and his chest He reports decreased p.o. intake.  Also reports decreased urine output    Past Medical History:  Diagnosis Date  . Anxiety   . Arthritis   . Cerebral vasculitis   . Chronic headaches    constant since possible stroke in 1997,"Vasculitis of brain with increased pressure of brain ".  . COPD (chronic obstructive pulmonary disease) ([place])   . COPD (chronic obstructive pulmonary disease) ([place])   . Diabetes mellitus   . Hyperlipidemia   . Hypertension   . Hypertriglyceridemia   . Sleep apnea    has but doesnt use, cannot tolerate; PCP aware  . Stroke [facility])    pt states,"they arent sure if i had a stroke in 1997, there is still a question about that with my doctors". No deficits.  . Tobacco abuse   . Ulcerative colitis ([place])   . Ulcerative colitis ([place])   . Venous stasis     Patient Active Problem List   Diagnosis Date Noted  . Rectal pain 10/02/2016  . Rectal bleeding 10/02/2016  .  Ulcerative proctitis with rectal bleeding ([place]) 10/02/2016  . Venous stasis ulcer ([place]) 03/10/2013  . COPD (chronic obstructive pulmonary disease) ([place]) 03/10/2013  . Tobacco abuse 03/10/2013  . Morbid obesity ([place]) 03/10/2013  . Diabetes ([place]) 03/10/2013  . Diabetes mellitus without complication ([place]) 91/69/4503  . HTN (hypertension) 10/01/2012  . Other and unspecified hyperlipidemia 10/01/2012  . Chronic pain syndrome 10/01/2012  . Knee pain 03/02/2012  . Knee stiffness 03/02/2012    Past Surgical History:  Procedure Laterality Date  . BIOPSY  10/25/2016   Procedure: BIOPSY;  Surgeon: Rogene Houston, MD;  Location: [facility] END[facility] SU[facility];  Service: Endoscopy;;  colon  . CERVICAL SPINE SURGERY  2008   x2-last surgery 04/24/2016.  Marland Kitchen COLONOSCOPY WITH PROPOFOL N/A 10/25/2016   Procedure: COLONOSCOPY WITH PROPOFOL;  Surgeon: Rogene Houston, MD;  Location: [facility] END[facility] SUITE;  Service: Endoscopy;  Laterality: N/A;  7:30  . KNEE ARTHROSCOPY  02/25/2012   Procedure: ARTHROSCOPY KNEE;  Surgeon: Sanjuana Kava, MD;  Location: [facility] ORS;  Service: Orthopedics;  Laterality: Right;  . KNEE ARTHROSCOPY WITH MEDIAL MENISECTOMY Left 12/23/2014   Procedure: LEFT KNEE ARTHROSCOPY WITH PARTIAL MENISECTOMY;  Surgeon: Sanjuana Kava, MD;  Location: [facility] ORS;  Service: Orthopedics;  Laterality: Left;  . POLYPECTOMY  10/25/2016   Procedure: POLYPECTOMY;  Surgeon: Rogene Houston, MD;  Location: [facility];  Service: Endoscopy;;  colon  .  SHOULDER ARTHROSCOPY  2004   left shoulder  . SHOULDER ARTHROSCOPY  2008   right shoulder       Family History  Problem Relation Age of Onset  . Hypertension Father   . Diabetes Father   . Heart disease Father   . Heart disease Maternal Grandfather   . Heart attack Other   . Heart disease Brother        both brothers have heart disease  . Hypertension Sister     Social History   Tobacco Use  . Smoking status: Current Every Day Smoker    Packs/day: 1.00    Years: 35.00     Pack years: 35.00    Types: Cigarettes  . Smokeless tobacco: Never Used  Substance Use Topics  . Alcohol use: No  . Drug use: No    Home Medications Prior to Admission medications   Medication Sig Start Date End Date Taking? Authorizing Provider  albuterol (VENTOLIN HFA) 108 (90 Base) MCG/ACT inhaler INHALE 2 PUFFS BY MOUTH EVERY 6 HOURS AS NEEDED FOR SHORTNESS OF BREAT H 05/05/20   Elvia Collum M, DO  Alcohol Swabs (PHARMACIST CHOICE ALCOHOL) PADS Reported on 01/23/2016 06/28/13   [provider]  ALPRAZolam Duanne Moron) 1 MG tablet Take 1 tablet (1 mg total) by mouth 2 (two) times daily as needed. Per psychiatry 05/05/20   Elvia Collum M, DO  aspirin EC 325 MG tablet Take 325 mg by mouth every morning. Reported on 01/23/2016    [provider]  atorvastatin (LIPITOR) 20 MG tablet TAKE 1 TABLET BY MOUTH AT BEDTIME FOR CHOLESTEROL 07/17/20   Lovena Le, Malena M, DO  Blood Glucose Monitoring Suppl w/Device KIT Test glucose 4 times daily. E11.9 04/30/17   Mikey Kirschner, MD  buPROPion [facility] SR) 150 MG 12 hr tablet TAKE 1 TABLET BY MOUTH TWICE DAILY 06/28/20   Lovena Le, Malena M, DO  enalapril (VASOTEC) 20 MG tablet TAKE (2) TABLETS BY MOUTH ONCE DAILY. 07/17/20   Lovena Le, Malena M, DO  Fluticasone-Salmeterol (ADVAIR DISKUS) 250-50 MCG/DOSE AEPB USE 1 INHALATION BY MOUTH EVERY 12 HOURS. <<RINSE MOUTH AFTER USE>>. 03/28/20   Lovena Le, Malena M, DO  furosemide (LASIX) 20 MG tablet Take 1 tablet (20 mg total) by mouth daily. 03/28/20   Erven Colla, DO  hydrocortisone (ANUSOL-HC) 2.5 % rectal cream Place 1 application rectally 2 (two) times daily. 09/25/16   Mikey Kirschner, MD  hyoscyamine (LEVSIN SL) 0.125 MG SL tablet TAKE (1) TABLET UNDER TONGUE EVERY FOUR HOURS AS NEEDED FOR CRAMPING. 11/13/16   Mikey Kirschner, MD  insulin aspart (NOVOLOG) 100 UNIT/ML injection INJECT S.Q. 4 TO 12 UNITS FOUR TIMES DAILY PER SLIDING SCALE. 03/28/20   Lovena Le, Malena M, DO  insulin detemir  (LEVEMIR) 100 UNIT/ML injection INJECT 40 UNITS SUBCUTANEOUSLY TWICE DAILY. 03/28/20   Elvia Collum M, DO  Insulin Syringe-Needle U-100 (TRUEPLUS INSULIN SYRINGE) 31G X 5/16" 1 ML MISC USE AS DIRECTED 03/28/20   Lovena Le, Malena M, DO  levETIRAcetam (KEPPRA) 500 MG tablet TAKE 3 TABLETS BY MOUTH TWICE DAILY 07/17/20   Lovena Le, Malena M, DO  Mesalamine (ASACOL) 400 MG CPDR DR capsule TAKE (1) CAPSULE BY MOUTH FOUR TIMES DAILY 05/05/20   Lovena Le, Malena M, DO  metFORMIN (GLUCOPHAGE) 500 MG tablet TAKE (2) TABLETS QAM AND ONE TABLET IN THE EVENINGS 03/28/20   Elvia Collum M, DO  morphine (MS CONTIN) 30 MG 12 hr tablet Take 1 tablet (30 mg total) by mouth 2 (two) times daily. May  fill 60 days after 09/30/2017 12/29/19   Elvia Collum M, DO  Multiple Vitamins-Minerals (CENTRUM PO) Take 1 tablet by mouth daily. Reported on 01/23/2016    [provider]  naproxen (NAPROSYN) 500 MG tablet Take one tablet bid with food --- breakfast and supper 03/30/20   Elvia Collum M, DO  niacin (NIASPAN) 1000 MG CR tablet TAKE (2) TABLETS BY MOUTH ONCE DAILY. 07/17/20   Elvia Collum M, DO  Oxycodone HCl 10 MG TABS Take 1 tablet by mouth 4 times a day as needed for pain. Do not drive or operate machinery while on this medicine. 12/29/19   Elvia Collum M, DO  OZEMPIC 0.25 or 0.5 MG/DOSE [facility]  12/25/17   [provider]  terazosin (HYTRIN) 10 MG capsule TAKE (1) CAPSULE BY MOUTH ONCE DAILY. 07/17/20   Lovena Le, Malena M, DO  tiZANidine (ZANAFLEX) 4 MG tablet TAKE 1 TABLET THREE TIMES DAILY AS NEEDED FOR MUSCLE SPASM 06/28/20   Lovena Le, Malena M, DO  TRUE METRIX BLOOD GLUCOSE TEST test strip CHECK BLOOD SUGAR 4 TIMES DAILY. 09/15/19   Mikey Kirschner, MD  vitamin C (ASCORBIC ACID) 500 MG tablet Take 500 mg by mouth daily. Reported on 01/23/2016    [provider]    Allergies    Gabapentin  Review of Systems   Review of Systems  Constitutional: Positive for appetite change, chills and fever.   Cardiovascular: Negative for chest pain.  Gastrointestinal: Positive for abdominal pain, constipation, nausea and vomiting. Negative for blood in stool, hematemesis and hematochezia.  Genitourinary: Positive for decreased urine volume.    Physical Exam Updated Vital Signs BP (!) 141/95   Pulse (!) 117   Temp 98.1 F (36.7 C) (Oral)   Resp 20   Ht 1.702 m ('5\' 7"' )   Wt 99.8 kg   SpO2 94%   BMI 34.46 kg/m   Physical Exam  CONSTITUTIONAL: Well developed/well nourished HEAD: Normocephalic/atraumatic EYES: EOMI/PERRL ENMT: Mucous membranes moist NECK: supple no meningeal signs SPINE/BACK:entire spine nontender CV: S1/S2 noted, no murmurs/rubs/gallops noted LUNGS: Lungs are clear to auscultation bilaterally, no apparent distress ABDOMEN: soft, mild diffuse tenderness, no rebound or guarding, bowel sounds noted throughout abdomen GU:no cva tenderness NEURO: Pt is awake/alert/appropriate, moves all extremitiesx4.  No facial droop.   EXTREMITIES: pulses normal/equal, full ROM SKIN: warm, color normal, area of rash noted to left flank could be consistent with zoster PSYCH: no abnormalities of mood noted, alert and oriented to situation      ED Results / Procedures / Treatments   Labs (all labs ordered are listed, but only abnormal results are displayed) Labs Reviewed  COMPREHENSIVE METABOLIC PANEL - Abnormal; Notable for the following components:      Result Value   Glucose, Bld 182 (*)    All other components within normal limits  CBC WITH DIFFERENTIAL/PLATELET  LIPASE, BLOOD  URINALYSIS, ROUTINE W REFLEX MICROSCOPIC    EKG EKG Interpretation  Date/Time:  Friday July 21 2020 23:59:55 EST Ventricular Rate:  113 PR Interval:    QRS Duration: 149 QT Interval:  345 QTC Calculation: 473 R Axis:   -168 Text Interpretation: Sinus tachycardia RBBB and LPFB Abnormal inferior Q waves Confirmed by Ripley Fraise [phone]) on 07/22/2020 12:14:18 AM   Radiology CT ABDOMEN  PELVIS W CONTRAST  Result Date: 07/22/2020 CLINICAL DATA:  Initial evaluation for acute nonlocalized abdominal pain. EXAM: CT ABDOMEN AND PELVIS WITH CONTRAST TECHNIQUE: Multidetector CT imaging of the abdomen and pelvis was performed using the standard  protocol following bolus administration of intravenous contrast. CONTRAST:  126m OMNIPAQUE IOHEXOL 300 MG/ML  SOLN COMPARISON:  None available. FINDINGS: Lower chest: Visualized lung bases are clear. Hepatobiliary: Liver demonstrates a normal contrast enhanced appearance. Focal fat deposition noted adjacent the falciform ligament. Trace hyperdensity at the gallbladder fundus could reflect stones and/or sludge possibly small gallbladder polyps (series 2, image 35). No evidence for acute cholecystitis. No biliary dilatation. Pancreas: Pancreas within normal limits. Spleen: Spleen mildly enlarged measuring up to 15.4 cm in AP diameter. Adrenals/Urinary Tract: 4.7 cm fat density mass arising from the right adrenal gland consistent with a myelolipoma. Minimal thickening of the left adrenal gland without discrete nodule or mass. Kidneys fairly equal in size with symmetric enhancement. Multiple nonobstructive calculi present within the right kidney, largest of which measures 8 mm at the lower pole. On the left, there is a 4 mm stone at the left UPJ with secondary mild fullness of the left renal collecting system without overt hydronephrosis (series 2, image 41). Additional 4 mm nonobstructive calculus present at the lower pole the left kidney. No other radiopaque calculi seen along the course of either renal collecting system. No hydroureter. No focal enhancing renal mass. Partially distended bladder within normal limits. Stomach/Bowel: Stomach within normal limits. No evidence for bowel obstruction. Normal appendix. No acute inflammatory changes seen about the bowels. Vascular/Lymphatic: Aorto bi-iliac atherosclerotic disease without aneurysm. Mesenteric vessels patent  proximally. No adenopathy. Reproductive: Prostate mildly enlarged and nodular in appearance invaginating upon the base of the bladder. Prostate measures up to 4.8 cm in transverse diameter. Other: No free air or fluid. Musculoskeletal: Minimal compression deformity at the superior endplate of TL84is chronic in appearance. No acute osseous finding. No discrete or worrisome osseous lesions. IMPRESSION: 1. 4 mm stone at the left UPJ with secondary mild fullness of the left renal collecting system without overt hydronephrosis. 2. Additional bilateral nonobstructive nephrolithiasis as above. 3. No other acute intra-abdominal or pelvic process. 4. Prostate mildly enlarged and nodular in appearance invaginating upon the base of the bladder. 5. 4.7 cm right adrenal myelolipoma. 6. Mild splenomegaly. 7. Aortic Atherosclerosis (ICD10-I70.0). Electronically Signed   By: BJeannine BogaM.D.   On: 07/22/2020 04:52    Procedures Procedures  Medications Ordered in ED Medications  iohexol (OMNIPAQUE) 9 MG/ML oral solution (has no administration in time range)  fentaNYL (SUBLIMAZE) injection 100 mcg (has no administration in time range)  ondansetron (ZOFRAN) injection 4 mg (has no administration in time range)  fentaNYL (SUBLIMAZE) injection 100 mcg (100 mcg Intravenous Given 07/22/20 0048)  lactated ringers bolus 2,000 mL (0 mLs Intravenous Stopped 07/22/20 0404)  ondansetron (ZOFRAN) injection 4 mg (4 mg Intravenous Given 07/22/20 0123)  fentaNYL (SUBLIMAZE) injection 100 mcg (100 mcg Intravenous Given 07/22/20 0208)  iohexol (OMNIPAQUE) 300 MG/ML solution 100 mL (100 mLs Intravenous Contrast Given 07/22/20 0415)  ketorolac (TORADOL) 30 MG/ML injection 30 mg (30 mg Intravenous Given 07/22/20 0507)    ED Course  I have reviewed the triage vital signs and the nursing notes.  Pertinent labs & imaging results that were available during my care of the patient were reviewed by me and considered in my medical decision  making (see chart for details).    MDM Rules/Calculators/A&P                          1:25 AM Strong suspicion this represents shingles.  He reports burning type sensation and he has early signs  of it on his skin.  Will treat pain, nausea and give IV fluids and reassess.  Overall labs are reassuring 7:11 AM Due to persistent pain CT imaging was ordered. CT scan reveals left ureteral stone. Overall patient is improving after IV fluids and pain medication.  He is now tolerating p.o.  For the zoster findings, will start him on valacyclovir. He has been referred to urology if his abdominal pain continues for definitive management of kidney stone though it will likely pass spontaneously He reports he has PCP follow-up in about 48 hours Final Clinical Impression(s) / ED Diagnoses Final diagnoses:  Ureteral stone  Herpes zoster without complication    Rx / DC Orders ED Discharge Orders         Ordered    valACYclovir (VALTREX) 1000 MG tablet  3 times daily        07/22/20 1783           Ripley Fraise, MD 07/22/20 [phone]

## 2020-07-24 ENCOUNTER — Ambulatory Visit (INDEPENDENT_AMBULATORY_CARE_PROVIDER_SITE_OTHER): Payer: Medicare Other | Admitting: Family Medicine

## 2020-07-24 ENCOUNTER — Encounter: Payer: Self-pay | Admitting: Family Medicine

## 2020-07-24 ENCOUNTER — Other Ambulatory Visit: Payer: Self-pay

## 2020-07-24 VITALS — BP 118/66 | HR 90 | Temp 97.6°F | Ht 67.0 in | Wt 215.0 lb

## 2020-07-24 DIAGNOSIS — B029 Zoster without complications: Secondary | ICD-10-CM

## 2020-07-24 DIAGNOSIS — E119 Type 2 diabetes mellitus without complications: Secondary | ICD-10-CM

## 2020-07-24 DIAGNOSIS — N200 Calculus of kidney: Secondary | ICD-10-CM

## 2020-07-24 DIAGNOSIS — K5903 Drug induced constipation: Secondary | ICD-10-CM | POA: Diagnosis not present

## 2020-07-24 DIAGNOSIS — Z794 Long term (current) use of insulin: Secondary | ICD-10-CM

## 2020-07-24 MED ORDER — HYDROCORTISONE (PERIANAL) 2.5 % EX CREA: 1.0000 "application " | TOPICAL_CREAM | Freq: Two times a day (BID) | CUTANEOUS | 2 refills | Status: DC

## 2020-07-24 NOTE — Progress Notes (Deleted)
Pt here for DM follow up. Pt states he has not been able to eat or use bathroom. Pt checking sugars daily (150-200)  Pt was in ER on 07/21/20 for kidney stone and shingles. Pt was placed on Valtrex.

## 2020-07-24 NOTE — Progress Notes (Signed)
Patient ID: Shaun Harris, male    DOB: Apr 13, 1966, 55 y.o.   MRN: [id number]   Chief Complaint  Patient presents with  . Diabetes  . Nephrolithiasis  . Herpes Zoster   Subjective:    HPI  F/u DM2, recent shingles rash and left kidney stone.   Pt states he has not been able to eat or use bathroom. Pt checking sugars daily (150-200)  Pt was in ER on 07/21/20 for kidney stone and shingles. Pt was placed on Valtrex.  Pt seen for kidney stone on left flank on 07/21/20 in the ER. Pt is scheduled to see Urology about this.  And pt has h/o ulcerative colitis.  Hasn't had bm for over 1 wk.  Had constipation so bad that unable to go. Has had severe constipation in past, where he had vomiting.  Not had vomiting this time or fever.  CT 39m stone Left upj.  Rash on left flank, and they thought had shingles. Was given valtrex for this in ER. Urinating a little more. Pt stating the vomiting stopped from the pain and pt is taking phenergan. Then needing tums.   Pt normally on morphine 335mbid and oxycodone 1086mid. Hasn't had morphine since last Sunday since not having bm. Pt stating not taking the morphine when has constipation. Pt only taking the oxycodone 51m59md.    Taking naproxen for rectal area, pain in the past.  Medical History [place] a past medical history of Anxiety, Arthritis, Cerebral vasculitis, Chronic headaches, COPD (chronic obstructive pulmonary disease) (HCC)[place] (chronic obstructive pulmonary disease) (HCC)[place], Hyperlipidemia, Hypertension, Hypertriglyceridemia, Sleep apnea, Stroke (HCC)[place] abuse, Ulcerative colitis (HCC)[place] colitis (HCC)[place] Venous stasis.   Outpatient Encounter Medications as of 07/24/2020  Medication Sig  . albuterol (VENTOLIN HFA) 108 (90 Base) MCG/ACT inhaler INHALE 2 PUFFS BY MOUTH EVERY 6 HOURS AS NEEDED FOR SHORTNESS OF BREAT H (Patient taking differently: Inhale 2 puffs into the lungs every 6 (six) hours  as needed for wheezing or shortness of breath.)  . Alcohol Swabs (PHARMACIST CHOICE ALCOHOL) PADS Reported on 01/23/2016  . ALPRAZolam (XANAX) 1 MG tablet Take 1 tablet (1 mg total) by mouth 2 (two) times daily as needed. Per psychiatry (Patient not taking: No sig reported)  . aspirin EC 325 MG tablet Take 325 mg by mouth at bedtime.  . atMarland Kitchenrvastatin (LIPITOR) 20 MG tablet TAKE 1 TABLET BY MOUTH AT BEDTIME FOR CHOLESTEROL (Patient taking differently: Take 20 mg by mouth daily.)  . Blood Glucose Monitoring Suppl w/Device KIT Test glucose 4 times daily. E11.9  . buPROPion (WELLBUTRIN SR) 150 MG 12 hr tablet TAKE 1 TABLET BY MOUTH TWICE DAILY (Patient taking differently: Take 150 mg by mouth 2 (two) times daily.)  . enalapril (VASOTEC) 20 MG tablet TAKE (2) TABLETS BY MOUTH ONCE DAILY. (Patient taking differently: Take 20 mg by mouth 2 (two) times daily.)  . Fluticasone-Salmeterol (ADVAIR DISKUS) 250-50 MCG/DOSE AEPB USE 1 INHALATION BY MOUTH EVERY 12 HOURS. <<RINSE MOUTH AFTER USE>>. (Patient taking differently: Inhale 1 puff into the lungs in the morning and at bedtime. <<RINSE MOUTH AFTER USE>>.)  . furosemide (LASIX) 20 MG tablet Take 1 tablet (20 mg total) by mouth daily.  . hydrocortisone (ANUSOL-HC) 2.5 % rectal cream Place 1 application rectally 2 (two) times daily. (Patient taking differently: Place 1 application rectally 2 (two) times daily as needed (inflammation).)  . hyoscyamine (LEVSIN SL) 0.125 MG SL tablet TAKE (1) TABLET UNDER TONGUE EVERY  FOUR HOURS AS NEEDED FOR CRAMPING. (Patient taking differently: Take 0.125 mg by mouth every 4 (four) hours as needed for cramping.)  . insulin aspart (NOVOLOG) 100 UNIT/ML injection INJECT S.Q. 4 TO 12 UNITS FOUR TIMES DAILY PER SLIDING SCALE. (Patient taking differently: Inject 4-12 Units into the skin 5 (five) times daily as needed for high blood sugar.)  . insulin detemir (LEVEMIR) 100 UNIT/ML injection INJECT 40 UNITS SUBCUTANEOUSLY TWICE DAILY.  (Patient taking differently: Inject 20-30 Units into the skin 2 (two) times daily.)  . Insulin Syringe-Needle U-100 (TRUEPLUS INSULIN SYRINGE) 31G X 5/16" 1 ML MISC USE AS DIRECTED  . levETIRAcetam (KEPPRA) 500 MG tablet TAKE 3 TABLETS BY MOUTH TWICE DAILY (Patient taking differently: Take 1,500 mg by mouth 2 (two) times daily.)  . Mesalamine (ASACOL) 400 MG CPDR DR capsule TAKE (1) CAPSULE BY MOUTH FOUR TIMES DAILY (Patient taking differently: Take 400 mg by mouth 4 (four) times daily.)  . metFORMIN (GLUCOPHAGE) 500 MG tablet TAKE (2) TABLETS QAM AND ONE TABLET IN THE EVENINGS (Patient taking differently: Take 500-1,000 mg by mouth See admin instructions. Take 1000 mg in the morning and 500 mg at night)  . morphine (MS CONTIN) 30 MG 12 hr tablet Take 1 tablet (30 mg total) by mouth 2 (two) times daily. May fill 60 days after 09/30/2017  . Multiple Vitamins-Minerals (CENTRUM PO) Take 1 tablet by mouth daily.  . naproxen (NAPROSYN) 500 MG tablet Take one tablet bid with food --- breakfast and supper (Patient taking differently: Take 500 mg by mouth 2 (two) times daily with a meal.)  . niacin (NIASPAN) 1000 MG CR tablet TAKE (2) TABLETS BY MOUTH ONCE DAILY. (Patient taking differently: Take 2,000 mg by mouth at bedtime. TAKE (2) TABLETS BY MOUTH ONCE DAILY)  . Oxycodone HCl 10 MG TABS Take 1 tablet by mouth 4 times a day as needed for pain. Do not drive or operate machinery while on this medicine. (Patient taking differently: Take 10 mg by mouth 4 (four) times daily as needed (pain). Do not drive or operate machinery while on this medicine.)  . terazosin (HYTRIN) 10 MG capsule TAKE (1) CAPSULE BY MOUTH ONCE DAILY. (Patient taking differently: Take 10 mg by mouth at bedtime.)  . TRUE METRIX BLOOD GLUCOSE TEST test strip CHECK BLOOD SUGAR 4 TIMES DAILY.  . [EXPIRED] valACYclovir (VALTREX) 1000 MG tablet Take 1 tablet (1,000 mg total) by mouth 3 (three) times daily for 7 days.  . vitamin C (ASCORBIC ACID)  500 MG tablet Take 500 mg by mouth daily.  . [DISCONTINUED] hydrocortisone (ANUSOL-HC) 2.5 % rectal cream Place 1 application rectally 2 (two) times daily.  . [DISCONTINUED] OZEMPIC 0.25 or 0.5 MG/DOSE SOPN   . [DISCONTINUED] tiZANidine (ZANAFLEX) 4 MG tablet TAKE 1 TABLET THREE TIMES DAILY AS NEEDED FOR MUSCLE SPASM (Patient taking differently: Take 4 mg by mouth 3 (three) times daily as needed for muscle spasms.)   No facility-administered encounter medications on file as of 07/24/2020.     Review of Systems  Constitutional: Negative for chills and fever.  HENT: Negative for congestion, rhinorrhea and sore throat.   Respiratory: Negative for cough, shortness of breath and wheezing.   Cardiovascular: Negative for chest pain and leg swelling.  Gastrointestinal: Positive for abdominal pain, constipation and rectal pain. Negative for diarrhea, nausea and vomiting.  Genitourinary: Positive for flank pain. Negative for dysuria and frequency.  Skin: Positive for rash.  Neurological: Negative for dizziness, weakness and headaches.     Vitals  BP 118/66   Pulse 90   Temp 97.6 F (36.4 C)   Ht '5\' 7"'  (1.702 m)   Wt 215 lb (97.5 kg)   SpO2 97%   BMI 33.67 kg/m   Objective:   Physical Exam Vitals and nursing note reviewed.  Constitutional:      General: He is not in acute distress.    Appearance: Normal appearance. He is not ill-appearing.  HENT:     Head: Normocephalic.     Nose: Nose normal. No congestion.     Mouth/Throat:     Mouth: Mucous membranes are moist.     Pharynx: No oropharyngeal exudate.  Eyes:     Extraocular Movements: Extraocular movements intact.     Conjunctiva/sclera: Conjunctivae normal.     Pupils: Pupils are equal, round, and reactive to light.  Cardiovascular:     Rate and Rhythm: Normal rate and regular rhythm.     Pulses: Normal pulses.     Heart sounds: Normal heart sounds. No murmur heard.   Pulmonary:     Effort: Pulmonary effort is normal.      Breath sounds: Normal breath sounds. No wheezing, rhonchi or rales.  Abdominal:     General: Bowel sounds are normal. There is no distension.     Palpations: Abdomen is soft. There is no mass.     Tenderness: There is no abdominal tenderness. There is no guarding or rebound.     Hernia: No hernia is present.     Comments: +left flank pain  Musculoskeletal:        General: Normal range of motion.     Right lower leg: No edema.     Left lower leg: No edema.  Skin:    General: Skin is warm and dry.     Findings: Rash (left abdomen and left flank, erythematous papules) present.  Neurological:     General: No focal deficit present.     Mental Status: He is alert and oriented to person, place, and time.     Cranial Nerves: No cranial nerve deficit.  Psychiatric:        Mood and Affect: Mood normal.        Behavior: Behavior normal.        Thought Content: Thought content normal.        Judgment: Judgment normal.     Assessment and Plan   1. Left nephrolithiasis - Ambulatory referral to Urology  2. Drug-induced constipation  3. Herpes zoster without complication  4. Type 2 diabetes mellitus without complication, with long-term current use of insulin (HCC) - CBC - CMP14+EGFR - Hemoglobin A1c - Microalbumin, urine - Lipid panel   Gave handout on how to do bowel cleanout.  But advised pt to do miralax 3-4xper day and take 2 dulcolax to see if can get things going. Stay on liquid diet next 2 days.  Call if vomiting, fever, or having blood per rectum.   Left kidney stone- pt taking oxycodone 66m qid for h/o chronic pain. Advising to take naproxen bid. Call or rto if vomiting, fever, or not urinating. F/u with Urology as planned.  Herpes zoster- left flank.  Cont to finish the valtrex.   Pt in agreement.  F/u [id number] prn.

## 2020-07-25 ENCOUNTER — Ambulatory Visit (HOSPITAL_COMMUNITY)
Admission: RE | Admit: 2020-07-25 | Discharge: 2020-07-25 | Disposition: A | Discharge status: Home / Self Care | Payer: Medicare Other | Source: Ambulatory Visit | Attending: Urology | Admitting: Urology

## 2020-07-25 ENCOUNTER — Encounter: Payer: Self-pay | Admitting: Urology

## 2020-07-25 ENCOUNTER — Ambulatory Visit (INDEPENDENT_AMBULATORY_CARE_PROVIDER_SITE_OTHER): Payer: Medicare Other | Admitting: Urology

## 2020-07-25 VITALS — BP 124/85 | HR 103 | Temp 98.5°F | Ht 67.0 in | Wt 215.0 lb

## 2020-07-25 DIAGNOSIS — N200 Calculus of kidney: Secondary | ICD-10-CM | POA: Insufficient documentation

## 2020-07-25 LAB — URINALYSIS, ROUTINE W REFLEX MICROSCOPIC
Bilirubin, UA: NEGATIVE
Leukocytes,UA: NEGATIVE
Nitrite, UA: NEGATIVE
Specific Gravity, UA: 1.025 (ref 1.005–1.030)
Urobilinogen, Ur: 1 mg/dL (ref 0.2–1.0)
pH, UA: 5.5 (ref 5.0–7.5)

## 2020-07-25 LAB — MICROSCOPIC EXAMINATION
Epithelial Cells (non renal): NONE SEEN /hpf (ref 0–10)
Renal Epithel, UA: NONE SEEN /hpf
WBC, UA: NONE SEEN /hpf (ref 0–5)

## 2020-07-25 MED ORDER — PROMETHAZINE HCL 25 MG PO TABS: 12.5000 mg | ORAL_TABLET | Freq: Four times a day (QID) | ORAL | 1 refills | Status: DC | PRN

## 2020-07-25 MED ORDER — TAMSULOSIN HCL 0.4 MG PO CAPS: 0.4000 mg | ORAL_CAPSULE | Freq: Every day | ORAL | 0 refills | Status: DC

## 2020-07-25 NOTE — Progress Notes (Signed)
07/25/2020 4:35 PM   Shaun Harris 1966/07/14 [id number]  Referring provider: Erven Colla, DO [address] [place],  [place] [address]  Nephrolithiasis  HPI: Mr Fiallos is a 55yo here for evaluation of nephrolithiasis. Starting last week he had nausea with associated vomiting. He then developed difficulty urinating and then left flank pain and presented to [facility] ER. He underwent CT on 07/22/2020 which showed a 55m left UPJ calculus with mild left renal pelvis fullness. This is his first stone event. He is having intermittent, sharp, mild left flank pain. No fevers.    PMH: Past Medical History:  Diagnosis Date  . Anxiety   . Arthritis   . Cerebral vasculitis   . Chronic headaches    constant since possible stroke in 1997,"Vasculitis of brain with increased pressure of brain ".  . COPD (chronic obstructive pulmonary disease) ([place]   . COPD (chronic obstructive pulmonary disease) (HFuig   . Diabetes mellitus   . Hyperlipidemia   . Hypertension   . Hypertriglyceridemia   . Sleep apnea    has but doesnt use, cannot tolerate; PCP aware  . Stroke ([facility]    pt states,"they arent sure if i had a stroke in 1997, there is still a question about that with my doctors". No deficits.  . Tobacco abuse   . Ulcerative colitis ([place]   . Ulcerative colitis ([place]   . Venous stasis     Surgical History: Past Surgical History:  Procedure Laterality Date  . BIOPSY  10/25/2016   Procedure: BIOPSY;  Surgeon: NRogene Houston MD;  Location: [facility] ENDO SUITE;  Service: Endoscopy;;  colon  . CERVICAL SPINE SURGERY  2008   x2-last surgery 04/24/2016.  .Marland KitchenCOLONOSCOPY WITH PROPOFOL N/A 10/25/2016   Procedure: COLONOSCOPY WITH PROPOFOL;  Surgeon: NRogene Houston MD;  Location: [facility];  Service: Endoscopy;  Laterality: N/A;  7:30  . KNEE ARTHROSCOPY  02/25/2012   Procedure: ARTHROSCOPY KNEE;  Surgeon: WSanjuana Kava MD;  Location: [facility] ORS;  Service: Orthopedics;  Laterality: Right;  . KNEE ARTHROSCOPY  WITH MEDIAL MENISECTOMY Left 12/23/2014   Procedure: LEFT KNEE ARTHROSCOPY WITH PARTIAL MENISECTOMY;  Surgeon: WSanjuana Kava MD;  Location: [facility] OR[facility];  Service: Orthopedics;  Laterality: Left;  . POLYPECTOMY  10/25/2016   Procedure: POLYPECTOMY;  Surgeon: NRogene Houston MD;  Location: [facility];  Service: Endoscopy;;  colon  . SHOULDER ARTHROSCOPY  2004   left shoulder  . SHOULDER ARTHROSCOPY  2008   right shoulder    Home Medications:  Allergies as of 07/25/2020      Reactions   Gabapentin Other (See Comments)   Increased blood sugar      Medication List       Accurate as of July 25, 2020  4:35 PM. If you have any questions, ask your nurse or doctor.        Aimovig 70 MG/ML Soaj Generic drug: Erenumab-aooe Inject into the skin.   albuterol 108 (90 Base) MCG/ACT inhaler Commonly known as: Ventolin HFA INHALE 2 PUFFS BY MOUTH EVERY 6 HOURS AS NEEDED FOR SHORTNESS OF BREAT H   ALPRAZolam 1 MG tablet Commonly known as: XANAX Take 1 tablet (1 mg total) by mouth 2 (two) times daily as needed. Per psychiatry   ALPRAZolam 0.5 MG tablet Commonly known as: XANAX Take 0.5 mg by mouth 2 (two) times daily as needed.   aspirin EC 325 MG tablet Take 325 mg by mouth every morning. Reported on 01/23/2016  atorvastatin 20 MG tablet Commonly known as: LIPITOR TAKE 1 TABLET BY MOUTH AT BEDTIME FOR CHOLESTEROL   Blood Glucose Monitoring Suppl w/Device Kit Test glucose 4 times daily. E11.9   buPROPion 150 MG 12 hr tablet Commonly known as: WELLBUTRIN SR TAKE 1 TABLET BY MOUTH TWICE DAILY   CENTRUM PO Take 1 tablet by mouth daily. Reported on 01/23/2016   enalapril 20 MG tablet Commonly known as: VASOTEC TAKE (2) TABLETS BY MOUTH ONCE DAILY.   Fluticasone-Salmeterol 250-50 MCG/DOSE Aepb Commonly known as: Advair Diskus USE 1 INHALATION BY MOUTH EVERY 12 HOURS. <<RINSE MOUTH AFTER USE>>.   furosemide 20 MG tablet Commonly known as: LASIX Take 1 tablet (20 mg total) by  mouth daily.   hydrocortisone 2.5 % rectal cream Commonly known as: Anusol-HC Place 1 application rectally 2 (two) times daily.   hydrOXYzine 50 MG tablet Commonly known as: ATARAX/VISTARIL Take 50 mg by mouth at bedtime.   hyoscyamine 0.125 MG SL tablet Commonly known as: LEVSIN SL TAKE (1) TABLET UNDER TONGUE EVERY FOUR HOURS AS NEEDED FOR CRAMPING.   insulin aspart 100 UNIT/ML injection Commonly known as: NovoLOG INJECT S.Q. 4 TO 12 UNITS FOUR TIMES DAILY PER SLIDING SCALE.   insulin detemir 100 UNIT/ML injection Commonly known as: Levemir INJECT 40 UNITS SUBCUTANEOUSLY TWICE DAILY.   Insulin Syringe-Needle U-100 31G X 5/16" 1 ML Misc Commonly known as: TRUEplus Insulin Syringe USE AS DIRECTED   levETIRAcetam 500 MG tablet Commonly known as: KEPPRA TAKE 3 TABLETS BY MOUTH TWICE DAILY   Mesalamine 400 MG Cpdr DR capsule Commonly known as: ASACOL TAKE (1) CAPSULE BY MOUTH FOUR TIMES DAILY   metFORMIN 500 MG tablet Commonly known as: GLUCOPHAGE TAKE (2) TABLETS QAM AND ONE TABLET IN THE EVENINGS   morphine 30 MG 12 hr tablet Commonly known as: MS CONTIN Take 1 tablet (30 mg total) by mouth 2 (two) times daily. May fill 60 days after 09/30/2017   naproxen 500 MG tablet Commonly known as: NAPROSYN Take one tablet bid with food --- breakfast and supper   niacin 1000 MG CR tablet Commonly known as: NIASPAN TAKE (2) TABLETS BY MOUTH ONCE DAILY.   Oxycodone HCl 10 MG Tabs Take 1 tablet by mouth 4 times a day as needed for pain. Do not drive or operate machinery while on this medicine.   Ozempic (0.25 or 0.5 MG/DOSE) 2 MG/1.5ML Sopn Generic drug: Semaglutide(0.25 or 0.5MG/DOS)   Ozempic (1 MG/DOSE) 4 MG/3ML Sopn Generic drug: Semaglutide (1 MG/DOSE)   Pharmacist Choice Alcohol Pads Reported on 01/23/2016   terazosin 10 MG capsule Commonly known as: HYTRIN TAKE (1) CAPSULE BY MOUTH ONCE DAILY.   tiZANidine 4 MG tablet Commonly known as: ZANAFLEX TAKE 1  TABLET THREE TIMES DAILY AS NEEDED FOR MUSCLE SPASM   True Metrix Blood Glucose Test test strip Generic drug: glucose blood CHECK BLOOD SUGAR 4 TIMES DAILY.   valACYclovir 1000 MG tablet Commonly known as: VALTREX Take 1 tablet (1,000 mg total) by mouth 3 (three) times daily for 7 days.   vitamin C 500 MG tablet Commonly known as: ASCORBIC ACID Take 500 mg by mouth daily. Reported on 01/23/2016       Allergies:  Allergies  Allergen Reactions  . Gabapentin Other (See Comments)    Increased blood sugar    Family History: Family History  Problem Relation Age of Onset  . Hypertension Father   . Diabetes Father   . Heart disease Father   . Heart disease Maternal Grandfather   .  Heart attack Other   . Heart disease Brother        both brothers have heart disease  . Hypertension Sister     Social History:  reports that he has been smoking cigarettes. He has a 35.00 pack-year smoking history. He has never used smokeless tobacco. He reports that he does not drink alcohol and does not use drugs.  ROS: All other review of systems were reviewed and are negative except what is noted above in HPI  Physical Exam: BP 124/85   Pulse (!) 103   Temp 98.5 F (36.9 C)   Ht _0  (1.702 m)   Wt 215 lb (97.5 kg)   BMI 33.67 kg/m   Constitutional:  Alert and oriented, No acute distress. HEENT: [place] AT, moist mucus membranes.  Trachea midline, no masses. Cardiovascular: No clubbing, cyanosis, or edema. Respiratory: Normal respiratory effort, no increased work of breathing. GI: Abdomen is soft, nontender, nondistended, no abdominal masses GU: No CVA tenderness.  Lymph: No cervical or inguinal lymphadenopathy. Skin: No rashes, bruises or suspicious lesions. Neurologic: Grossly intact, no focal deficits, moving all 4 extremities. Psychiatric: Normal mood and affect.  Laboratory Data: Lab Results  Component Value Date   WBC 7.2 07/22/2020   HGB 14.2 07/22/2020   HCT 43.4 07/22/2020    MCV 93.7 07/22/2020   PLT 226 07/22/2020    Lab Results  Component Value Date   CREATININE 1.12 07/22/2020    No results found for: PSA  No results found for: TESTOSTERONE  Lab Results  Component Value Date   HGBA1C 5.9 (A) 12/29/2019    Urinalysis    Component Value Date/Time   COLORURINE YELLOW 07/21/2020 2356   APPEARANCEUR CLEAR 07/21/2020 2356   LABSPEC 1.020 07/21/2020 2356   PHURINE 6.0 07/21/2020 2356   GLUCOSEU NEGATIVE 07/21/2020 2356   HGBUR SMALL (A) 07/21/2020 2356   BILIRUBINUR NEGATIVE 07/21/2020 2356   KETONESUR >80 (A) 07/21/2020 2356   PROTEINUR NEGATIVE 07/21/2020 2356   UROBILINOGEN 0.2 12/21/2014 0845   NITRITE NEGATIVE 07/21/2020 2356   LEUKOCYTESUR NEGATIVE 07/21/2020 2356    Lab Results  Component Value Date   LABMICR 11.3 03/28/2020   BACTERIA NONE SEEN 07/21/2020    Pertinent Imaging: CT 07/22/2020: Images reviewed and discussed with the patient No results found for this or any previous visit.  No results found for this or any previous visit.  No results found for this or any previous visit.  No results found for this or any previous visit.  No results found for this or any previous visit.  No results found for this or any previous visit.  No results found for this or any previous visit.  No results found for this or any previous visit.   Assessment & Plan:    1. Nephrolithiasis -KUB, will call with results -We will start flomax daily and phenergan 2m TID PRN  -We discussed the management of kidney stones. These options include observation, ureteroscopy, shockwave lithotripsy (ESWL) and percutaneous nephrolithotomy (PCNL). We discussed which options are relevant to the patient's stone(s). We discussed the natural history of kidney stones as well as the complications of untreated stones and the impact on quality of life without treatment as well as with each of the above listed treatments. We also discussed the efficacy of  each treatment in its ability to clear the stone burden. With any of these management options I discussed the signs and symptoms of infection and the need for emergent treatment should these  be experienced. For each option we discussed the ability of each procedure to clear the patient of their stone burden.   For observation I described the risks which include but are not limited to silent renal damage, life-threatening infection, need for emergent surgery, failure to pass stone and pain.   For ureteroscopy I described the risks which include bleeding, infection, damage to contiguous structures, positioning injury, ureteral stricture, ureteral avulsion, ureteral injury, need for prolonged ureteral stent, inability to perform ureteroscopy, need for an interval procedure, inability to clear stone burden, stent discomfort/pain, heart attack, stroke, pulmonary embolus and the inherent risks with general anesthesia.   For shockwave lithotripsy I described the risks which include arrhythmia, kidney contusion, kidney hemorrhage, need for transfusion, pain, inability to adequately break up stone, inability to pass stone fragments, Steinstrasse, infection associated with obstructing stones, need for alternate surgical procedure, need for repeat shockwave lithotripsy, MI, CVA, PE and the inherent risks with anesthesia/conscious sedation.   For PCNL I described the risks including positioning injury, pneumothorax, hydrothorax, need for chest tube, inability to clear stone burden, renal laceration, arterial venous fistula or malformation, need for embolization of kidney, loss of kidney or renal function, need for repeat procedure, need for prolonged nephrostomy tube, ureteral avulsion, MI, CVA, PE and the inherent risks of general anesthesia.   - The patient would like to proceed with ESWL  - Urinalysis, Routine w reflex microscopic   No follow-ups on file.  Nicolette Bang, MD  [facility]  [place]

## 2020-07-25 NOTE — Patient Instructions (Signed)

## 2020-07-25 NOTE — H&P (View-Only) (Signed)
07/25/2020 4:35 PM   [name] 1966/07/14 [id number]  Referring provider: Erven Colla, DO [address] [place],  [place] [address]  Nephrolithiasis  HPI: Mr Fiallos is a 55yo here for evaluation of nephrolithiasis. Starting last week he had nausea with associated vomiting. He then developed difficulty urinating and then left flank pain and presented to [facility]. He underwent CT on 07/22/2020 which showed a 55m left UPJ calculus with mild left renal pelvis fullness. This is his first stone event. He is having intermittent, sharp, mild left flank pain. No fevers.    PMH: Past Medical History:  Diagnosis Date  . Anxiety   . Arthritis   . Cerebral vasculitis   . Chronic headaches    constant since possible stroke in 1997,"Vasculitis of brain with increased pressure of brain ".  . COPD (chronic obstructive pulmonary disease) ([facility]   . COPD (chronic obstructive pulmonary disease) ([facility]   . Diabetes mellitus   . Hyperlipidemia   . Hypertension   . Hypertriglyceridemia   . Sleep apnea    has but doesnt use, cannot tolerate; PCP aware  . Stroke ([facility]    pt states,"they arent sure if i had a stroke in 1997, there is still a question about that with my doctors". No deficits.  . Tobacco abuse   . Ulcerative colitis ([facility]   . Ulcerative colitis ([facility]   . Venous stasis     Surgical History: Past Surgical History:  Procedure Laterality Date  . BIOPSY  10/25/2016   Procedure: BIOPSY;  Surgeon: NRogene Houston MD;  Location: [facility];  Service: Endoscopy;;  colon  . CERVICAL SPINE SURGERY  2008   x2-last surgery 04/24/2016.  .Marland KitchenCOLONOSCOPY WITH PROPOFOL N/A 10/25/2016   Procedure: COLONOSCOPY WITH PROPOFOL;  Surgeon: NRogene Houston MD;  Location: [facility];  Service: Endoscopy;  Laterality: N/A;  7:30  . KNEE ARTHROSCOPY  02/25/2012   Procedure: ARTHROSCOPY KNEE;  Surgeon: WSanjuana Kava MD;  Location: [facility] ORS;  Service: Orthopedics;  Laterality: Right;  . KNEE ARTHROSCOPY  WITH MEDIAL MENISECTOMY Left 12/23/2014   Procedure: LEFT KNEE ARTHROSCOPY WITH PARTIAL MENISECTOMY;  Surgeon: WSanjuana Kava MD;  Location: [facility];  Service: Orthopedics;  Laterality: Left;  . POLYPECTOMY  10/25/2016   Procedure: POLYPECTOMY;  Surgeon: NRogene Houston MD;  Location: [facility];  Service: Endoscopy;;  colon  . SHOULDER ARTHROSCOPY  2004   left shoulder  . SHOULDER ARTHROSCOPY  2008   right shoulder    Home Medications:  Allergies as of 07/25/2020      Reactions   Gabapentin Other (See Comments)   Increased blood sugar      Medication List       Accurate as of July 25, 2020  4:35 PM. If you have any questions, ask your nurse or doctor.        Aimovig 70 MG/ML Soaj Generic drug: Erenumab-aooe Inject into the skin.   albuterol 108 (90 Base) MCG/ACT inhaler Commonly known as: Ventolin HFA INHALE 2 PUFFS BY MOUTH EVERY 6 HOURS AS NEEDED FOR SHORTNESS OF BREAT H   ALPRAZolam 1 MG tablet Commonly known as: XANAX Take 1 tablet (1 mg total) by mouth 2 (two) times daily as needed. Per psychiatry   ALPRAZolam 0.5 MG tablet Commonly known as: XANAX Take 0.5 mg by mouth 2 (two) times daily as needed.   aspirin EC 325 MG tablet Take 325 mg by mouth every morning. Reported on 01/23/2016  atorvastatin 20 MG tablet Commonly known as: LIPITOR TAKE 1 TABLET BY MOUTH AT BEDTIME FOR CHOLESTEROL   Blood Glucose Monitoring Suppl w/Device Kit Test glucose 4 times daily. E11.9   buPROPion 150 MG 12 hr tablet Commonly known as: WELLBUTRIN SR TAKE 1 TABLET BY MOUTH TWICE DAILY   CENTRUM PO Take 1 tablet by mouth daily. Reported on 01/23/2016   enalapril 20 MG tablet Commonly known as: VASOTEC TAKE (2) TABLETS BY MOUTH ONCE DAILY.   Fluticasone-Salmeterol 250-50 MCG/DOSE Aepb Commonly known as: Advair Diskus USE 1 INHALATION BY MOUTH EVERY 12 HOURS. <<RINSE MOUTH AFTER USE>>.   furosemide 20 MG tablet Commonly known as: LASIX Take 1 tablet (20 mg total) by  mouth daily.   hydrocortisone 2.5 % rectal cream Commonly known as: Anusol-HC Place 1 application rectally 2 (two) times daily.   hydrOXYzine 50 MG tablet Commonly known as: ATARAX/VISTARIL Take 50 mg by mouth at bedtime.   hyoscyamine 0.125 MG SL tablet Commonly known as: LEVSIN SL TAKE (1) TABLET UNDER TONGUE EVERY FOUR HOURS AS NEEDED FOR CRAMPING.   insulin aspart 100 UNIT/ML injection Commonly known as: NovoLOG INJECT S.Q. 4 TO 12 UNITS FOUR TIMES DAILY PER SLIDING SCALE.   insulin detemir 100 UNIT/ML injection Commonly known as: Levemir INJECT 40 UNITS SUBCUTANEOUSLY TWICE DAILY.   Insulin Syringe-Needle U-100 31G X 5/16" 1 ML Misc Commonly known as: TRUEplus Insulin Syringe USE AS DIRECTED   levETIRAcetam 500 MG tablet Commonly known as: KEPPRA TAKE 3 TABLETS BY MOUTH TWICE DAILY   Mesalamine 400 MG Cpdr DR capsule Commonly known as: ASACOL TAKE (1) CAPSULE BY MOUTH FOUR TIMES DAILY   metFORMIN 500 MG tablet Commonly known as: GLUCOPHAGE TAKE (2) TABLETS QAM AND ONE TABLET IN THE EVENINGS   morphine 30 MG 12 hr tablet Commonly known as: MS CONTIN Take 1 tablet (30 mg total) by mouth 2 (two) times daily. May fill 60 days after 09/30/2017   naproxen 500 MG tablet Commonly known as: NAPROSYN Take one tablet bid with food --- breakfast and supper   niacin 1000 MG CR tablet Commonly known as: NIASPAN TAKE (2) TABLETS BY MOUTH ONCE DAILY.   Oxycodone HCl 10 MG Tabs Take 1 tablet by mouth 4 times a day as needed for pain. Do not drive or operate machinery while on this medicine.   Ozempic (0.25 or 0.5 MG/DOSE) 2 MG/1.5ML Sopn Generic drug: Semaglutide(0.25 or 0.5MG/DOS)   Ozempic (1 MG/DOSE) 4 MG/3ML Sopn Generic drug: Semaglutide (1 MG/DOSE)   Pharmacist Choice Alcohol Pads Reported on 01/23/2016   terazosin 10 MG capsule Commonly known as: HYTRIN TAKE (1) CAPSULE BY MOUTH ONCE DAILY.   tiZANidine 4 MG tablet Commonly known as: ZANAFLEX TAKE 1  TABLET THREE TIMES DAILY AS NEEDED FOR MUSCLE SPASM   True Metrix Blood Glucose Test test strip Generic drug: glucose blood CHECK BLOOD SUGAR 4 TIMES DAILY.   valACYclovir 1000 MG tablet Commonly known as: VALTREX Take 1 tablet (1,000 mg total) by mouth 3 (three) times daily for 7 days.   vitamin C 500 MG tablet Commonly known as: ASCORBIC ACID Take 500 mg by mouth daily. Reported on 01/23/2016       Allergies:  Allergies  Allergen Reactions  . Gabapentin Other (See Comments)    Increased blood sugar    Family History: Family History  Problem Relation Age of Onset  . Hypertension Father   . Diabetes Father   . Heart disease Father   . Heart disease Maternal Grandfather   .  Heart attack Other   . Heart disease Brother        both brothers have heart disease  . Hypertension Sister     Social History:  reports that he has been smoking cigarettes. He has a 35.00 pack-year smoking history. He has never used smokeless tobacco. He reports that he does not drink alcohol and does not use drugs.  ROS: All other review of systems were reviewed and are negative except what is noted above in HPI  Physical Exam: BP 124/85   Pulse (!) 103   Temp 98.5 F (36.9 C)   Ht 5' 7" (1.702 m)   Wt 215 lb (97.5 kg)   BMI 33.67 kg/m   Constitutional:  Alert and oriented, No acute distress. HEENT: [place] AT, moist mucus membranes.  Trachea midline, no masses. Cardiovascular: No clubbing, cyanosis, or edema. Respiratory: Normal respiratory effort, no increased work of breathing. GI: Abdomen is soft, nontender, nondistended, no abdominal masses GU: No CVA tenderness.  Lymph: No cervical or inguinal lymphadenopathy. Skin: No rashes, bruises or suspicious lesions. Neurologic: Grossly intact, no focal deficits, moving all 4 extremities. Psychiatric: Normal mood and affect.  Laboratory Data: Lab Results  Component Value Date   WBC 7.2 07/22/2020   HGB 14.2 07/22/2020   HCT 43.4 07/22/2020    MCV 93.7 07/22/2020   PLT 226 07/22/2020    Lab Results  Component Value Date   CREATININE 1.12 07/22/2020    No results found for: PSA  No results found for: TESTOSTERONE  Lab Results  Component Value Date   HGBA1C 5.9 (A) 12/29/2019    Urinalysis    Component Value Date/Time   COLORURINE YELLOW 07/21/2020 2356   APPEARANCEUR CLEAR 07/21/2020 2356   LABSPEC 1.020 07/21/2020 2356   PHURINE 6.0 07/21/2020 2356   GLUCOSEU NEGATIVE 07/21/2020 2356   HGBUR SMALL (A) 07/21/2020 2356   BILIRUBINUR NEGATIVE 07/21/2020 2356   KETONESUR >80 (A) 07/21/2020 2356   PROTEINUR NEGATIVE 07/21/2020 2356   UROBILINOGEN 0.2 12/21/2014 0845   NITRITE NEGATIVE 07/21/2020 2356   LEUKOCYTESUR NEGATIVE 07/21/2020 2356    Lab Results  Component Value Date   LABMICR 11.3 03/28/2020   BACTERIA NONE SEEN 07/21/2020    Pertinent Imaging: CT 07/22/2020: Images reviewed and discussed with the patient No results found for this or any previous visit.  No results found for this or any previous visit.  No results found for this or any previous visit.  No results found for this or any previous visit.  No results found for this or any previous visit.  No results found for this or any previous visit.  No results found for this or any previous visit.  No results found for this or any previous visit.   Assessment & Plan:    1. Nephrolithiasis -KUB, will call with results -We will start flomax daily and phenergan 25mg TID PRN  -We discussed the management of kidney stones. These options include observation, ureteroscopy, shockwave lithotripsy (ESWL) and percutaneous nephrolithotomy (PCNL). We discussed which options are relevant to the patient's stone(s). We discussed the natural history of kidney stones as well as the complications of untreated stones and the impact on quality of life without treatment as well as with each of the above listed treatments. We also discussed the efficacy of  each treatment in its ability to clear the stone burden. With any of these management options I discussed the signs and symptoms of infection and the need for emergent treatment should these   be experienced. For each option we discussed the ability of each procedure to clear the patient of their stone burden.   For observation I described the risks which include but are not limited to silent renal damage, life-threatening infection, need for emergent surgery, failure to pass stone and pain.   For ureteroscopy I described the risks which include bleeding, infection, damage to contiguous structures, positioning injury, ureteral stricture, ureteral avulsion, ureteral injury, need for prolonged ureteral stent, inability to perform ureteroscopy, need for an interval procedure, inability to clear stone burden, stent discomfort/pain, heart attack, stroke, pulmonary embolus and the inherent risks with general anesthesia.   For shockwave lithotripsy I described the risks which include arrhythmia, kidney contusion, kidney hemorrhage, need for transfusion, pain, inability to adequately break up stone, inability to pass stone fragments, Steinstrasse, infection associated with obstructing stones, need for alternate surgical procedure, need for repeat shockwave lithotripsy, MI, CVA, PE and the inherent risks with anesthesia/conscious sedation.   For PCNL I described the risks including positioning injury, pneumothorax, hydrothorax, need for chest tube, inability to clear stone burden, renal laceration, arterial venous fistula or malformation, need for embolization of kidney, loss of kidney or renal function, need for repeat procedure, need for prolonged nephrostomy tube, ureteral avulsion, MI, CVA, PE and the inherent risks of general anesthesia.   - The patient would like to proceed with ESWL  - Urinalysis, Routine w reflex microscopic   No follow-ups on file.  Nicolette Bang, MD  [facility]  [place]

## 2020-07-25 NOTE — Progress Notes (Signed)
Urological Symptom Review  Patient is experiencing the following symptoms: none   Review of Systems  Gastrointestinal (upper)  : Negative for upper GI symptoms  Gastrointestinal (lower) : Negative for lower GI symptoms  Constitutional : Negative for symptoms  Skin: Skin rash/lesion  Eyes: Negative for eye symptoms  Ear/Nose/Throat : Negative for Ear/Nose/Throat symptoms  Hematologic/Lymphatic: Negative for Hematologic/Lymphatic symptoms  Cardiovascular : Negative for cardiovascular symptoms  Respiratory : Negative for respiratory symptoms  Endocrine: Negative for endocrine symptoms  Musculoskeletal: Joint pain  Neurological: Headaches  Psychologic: Depression Anxiety

## 2020-07-28 ENCOUNTER — Other Ambulatory Visit: Payer: Self-pay

## 2020-07-28 ENCOUNTER — Telehealth: Payer: Self-pay

## 2020-07-28 DIAGNOSIS — N200 Calculus of kidney: Secondary | ICD-10-CM

## 2020-07-28 NOTE — Telephone Encounter (Signed)
Pt called and notified of litho date of 08/08/2020. Pt will come by on 08/03/2020 for instructions.

## 2020-07-28 NOTE — Telephone Encounter (Signed)
-----   Message from Cleon Gustin, MD sent at 07/28/2020  4:06 PM EST ----- KUB shows 12mm mid pole renal calculus. Call to schedule ESWL please ----- Message ----- From: Dorisann Frames, RN Sent: 07/26/2020   8:36 AM EST To: Cleon Gustin, MD  Please review

## 2020-08-02 ENCOUNTER — Other Ambulatory Visit: Payer: Self-pay | Admitting: Family Medicine

## 2020-08-03 ENCOUNTER — Encounter (HOSPITAL_COMMUNITY)
Admission: RE | Admit: 2020-08-03 | Discharge: 2020-08-03 | Disposition: A | Discharge status: Home / Self Care | Payer: Medicare Other | Source: Ambulatory Visit | Attending: Urology | Admitting: Urology

## 2020-08-03 ENCOUNTER — Encounter (HOSPITAL_COMMUNITY): Payer: Self-pay

## 2020-08-03 ENCOUNTER — Other Ambulatory Visit: Payer: Self-pay

## 2020-08-07 ENCOUNTER — Other Ambulatory Visit (HOSPITAL_COMMUNITY)
Admission: RE | Admit: 2020-08-07 | Discharge: 2020-08-07 | Disposition: A | Discharge status: Home / Self Care | Payer: Medicare Other | Source: Ambulatory Visit | Attending: Urology | Admitting: Urology

## 2020-08-07 ENCOUNTER — Other Ambulatory Visit: Payer: Self-pay

## 2020-08-07 DIAGNOSIS — Z01812 Encounter for preprocedural laboratory examination: Secondary | ICD-10-CM | POA: Insufficient documentation

## 2020-08-07 DIAGNOSIS — Z20822 Contact with and (suspected) exposure to covid-19: Secondary | ICD-10-CM | POA: Insufficient documentation

## 2020-08-07 LAB — SARS CORONAVIRUS 2 (TAT 6-24 HRS): SARS Coronavirus 2: NEGATIVE

## 2020-08-08 ENCOUNTER — Encounter (HOSPITAL_COMMUNITY)
Admission: RE | Disposition: A | Discharge status: Home / Self Care | Payer: Self-pay | Source: Home / Self Care | Attending: Urology

## 2020-08-08 ENCOUNTER — Ambulatory Visit (HOSPITAL_COMMUNITY)
Admission: RE | Admit: 2020-08-08 | Discharge: 2020-08-08 | Disposition: A | Discharge status: Home / Self Care | Payer: Medicare Other | Attending: Urology | Admitting: Urology

## 2020-08-08 ENCOUNTER — Ambulatory Visit (HOSPITAL_COMMUNITY)
Admission: RE | Admit: 2020-08-08 | Discharge: 2020-08-08 | Disposition: A | Discharge status: Home / Self Care | Payer: Medicare Other | Source: Home / Self Care | Attending: Urology | Admitting: Urology

## 2020-08-08 DIAGNOSIS — N200 Calculus of kidney: Secondary | ICD-10-CM | POA: Insufficient documentation

## 2020-08-08 DIAGNOSIS — Z888 Allergy status to other drugs, medicaments and biological substances status: Secondary | ICD-10-CM | POA: Diagnosis not present

## 2020-08-08 DIAGNOSIS — Z7982 Long term (current) use of aspirin: Secondary | ICD-10-CM | POA: Insufficient documentation

## 2020-08-08 DIAGNOSIS — F17200 Nicotine dependence, unspecified, uncomplicated: Secondary | ICD-10-CM | POA: Insufficient documentation

## 2020-08-08 DIAGNOSIS — Z794 Long term (current) use of insulin: Secondary | ICD-10-CM | POA: Insufficient documentation

## 2020-08-08 DIAGNOSIS — Z79899 Other long term (current) drug therapy: Secondary | ICD-10-CM | POA: Diagnosis not present

## 2020-08-08 HISTORY — PX: EXTRACORPOREAL SHOCK WAVE LITHOTRIPSY: SHX1557

## 2020-08-08 LAB — GLUCOSE, CAPILLARY: Glucose-Capillary: 161 mg/dL — ABNORMAL HIGH (ref 70–99)

## 2020-08-08 SURGERY — LITHOTRIPSY, ESWL
Anesthesia: LOCAL | Laterality: Left

## 2020-08-08 MED ORDER — DIPHENHYDRAMINE HCL 25 MG PO CAPS
25.0000 mg | ORAL_CAPSULE | ORAL | Status: AC
  Administered 2020-08-08: 25 mg via ORAL
  Filled 2020-08-08: qty 1

## 2020-08-08 MED ORDER — DIAZEPAM 5 MG PO TABS
10.0000 mg | ORAL_TABLET | Freq: Once | ORAL | Status: AC
  Administered 2020-08-08: 10 mg via ORAL
  Filled 2020-08-08: qty 2

## 2020-08-08 MED ORDER — SODIUM CHLORIDE 0.9 % IV SOLN: INTRAVENOUS | Status: DC

## 2020-08-08 NOTE — Progress Notes (Signed)
Dr Alyson Ingles aware of shingles diagnosis in ED 07/21/2020.  Patient being treated and area is  dry now.  OK to proceed with ESL per Dr Alyson Ingles

## 2020-08-08 NOTE — Discharge Instructions (Signed)
Lithotripsy, Care After This sheet gives you information about how to care for yourself after your procedure. Your health care provider may also give you more specific instructions. If you have problems or questions, contact your health care provider. What can I expect after the procedure? After the procedure, it is common to have:  Some blood in your urine. This should only last for a few days.  Soreness in your back, sides, or upper abdomen for a few days.  Blotches or bruises on the area where the shock wave entered the skin.  Pain, discomfort, or nausea when pieces (fragments) of the kidney stone move through the tube that carries urine from the kidney to the bladder (ureter). Stone fragments may pass soon after the procedure, but they may continue to pass for up to 4-8 weeks. ? If you have severe pain or nausea, contact your health care provider. This may be caused by a large stone that was not broken up, and this may mean that you need more treatment.  Some pain or discomfort during urination.  Some pain or discomfort in the lower abdomen or (in men) at the base of the penis. Follow these instructions at home: Medicines  Take over-the-counter and prescription medicines only as told by your health care provider.  If you were prescribed an antibiotic medicine, take it as told by your health care provider. Do not stop taking the antibiotic even if you start to feel better.  Ask your health care provider if the medicine prescribed to you requires you to avoid driving or using machinery. Eating and drinking  Drink enough fluid to keep your urine pale yellow. This helps any remaining pieces of the stone to pass. It can also help prevent new stones from forming.  Eat plenty of fresh fruits and vegetables.  Follow instructions from your health care provider about eating or drinking restrictions. You may be instructed to: ? Reduce how much salt (sodium) you eat or drink. Check  ingredients and nutrition facts on packaged foods and beverages to see how much sodium they contain. ? Reduce how much meat you eat.  Eat the recommended amount of calcium for your age and gender. Ask your health care provider how much calcium you should have.      General instructions  Get plenty of rest.  Return to your normal activities as told by your health care provider. Ask your health care provider what activities are safe for you. Most people can resume normal activities 1-2 days after the procedure.  If you were given a sedative during the procedure, it can affect you for several hours. Do not drive or operate machinery until your health care provider says that it is safe.  Your health care provider may direct you to lie in a certain position (postural drainage) and tap firmly (percuss) over your kidney area to help stone fragments pass. Follow instructions as told by your health care provider.  If directed, strain all urine through the strainer that was provided by your health care provider. ? Keep all fragments for your health care provider to see. Any stones that are found may be sent to a medical lab for examination. The stone may be as small as a grain of salt.  Keep all follow-up visits as told by your health care provider. This is important. Contact a health care provider if:  You have a fever or chills.  You have nausea that is severe or does not go away.  You have  any of these urinary symptoms: ? Blood in your urine for longer than your health care provider told you to expect. ? Urine that smells bad or unusual. ? Feeling a strong urge to urinate after emptying your bladder. ? Pain or burning with urination that does not go away. ? Urinating more often than usual and this does not go away.  You have a stent and it comes out. Get help right away if:  You have severe pain in your back, sides, or upper abdomen.  You have any of these urinary symptoms: ? Severe  pain while urinating. ? More blood in your urine or having blood in your urine when you did not before. ? Passing blood clots in your urine. ? Passing only a small amount of urine or being unable to pass any urine at all.  You have severe nausea that leads to persistent vomiting.  You faint. Summary  After this procedure, it is common to have some pain, discomfort, or nausea when pieces (fragments) of the kidney stone move through the tube that carries urine from the kidney to the bladder (ureter). If this pain or nausea is severe, however, you should contact your health care provider.  Return to your normal activities as told by your health care provider. Ask your health care provider what activities are safe for you.  Drink enough fluid to keep your urine pale yellow. This helps any remaining pieces of the stone to pass, and it can help prevent new stones from forming.  If directed, strain your urine and keep all fragments for your health care provider to see. Fragments or stones may be as small as a grain of salt.  Get help right away if you have severe pain in your back, sides, or upper abdomen, or if you have severe pain while urinating. This information is not intended to replace advice given to you by your health care provider. Make sure you discuss any questions you have with your health care provider. Document Revised: 04/14/2019 Document Reviewed: 04/14/2019 Elsevier Patient Education  2021 [place].   PATIENT INSTRUCTIONS POST-ANESTHESIA  IMMEDIATELY FOLLOWING SURGERY:  Do not drive or operate machinery for the first twenty four hours after surgery.  Do not make any important decisions for twenty four hours after surgery or while taking narcotic pain medications or sedatives.  If you develop intractable nausea and vomiting or a severe headache please notify your doctor immediately.  FOLLOW-UP:  Please make an appointment with your surgeon as instructed. You do not need to  follow up with anesthesia unless specifically instructed to do so.  WOUND CARE INSTRUCTIONS (if applicable):  Keep a dry clean dressing on the anesthesia/puncture wound site if there is drainage.  Once the wound has quit draining you may leave it open to air.  Generally you should leave the bandage intact for twenty four hours unless there is drainage.  If the epidural site drains for more than 36-48 hours please call the anesthesia department.  QUESTIONS?:  Please feel free to call your physician or the hospital operator if you have any questions, and they will be happy to assist you.

## 2020-08-08 NOTE — Interval H&P Note (Signed)
History and Physical Interval Note:  08/08/2020 12:32 PM  Shaun Harris  has presented today for surgery, with the diagnosis of left renal calculus.  The various methods of treatment have been discussed with the patient and family. After consideration of risks, benefits and other options for treatment, the patient has consented to  Procedure(s): EXTRACORPOREAL SHOCK WAVE LITHOTRIPSY (ESWL) (Left) as a surgical intervention.  The patient's history has been reviewed, patient examined, no change in status, stable for surgery.  I have reviewed the patient's chart and labs.  Questions were answered to the patient's satisfaction.     Nicolette Bang

## 2020-08-08 NOTE — Progress Notes (Addendum)
Received patient from [place] [employer] - took patient to restroom .  Patient urinated - clear yellow urine - no fragments noted.  No obvious redness noted at left flank. There is a tegaderm applied to left lower flank and left lower abdomen.

## 2020-08-10 ENCOUNTER — Encounter (HOSPITAL_COMMUNITY): Payer: Self-pay | Admitting: Urology

## 2020-08-25 ENCOUNTER — Other Ambulatory Visit: Payer: Self-pay

## 2020-08-25 ENCOUNTER — Encounter: Payer: Self-pay | Admitting: Urology

## 2020-08-25 ENCOUNTER — Ambulatory Visit (INDEPENDENT_AMBULATORY_CARE_PROVIDER_SITE_OTHER): Payer: Medicare Other | Admitting: Urology

## 2020-08-25 ENCOUNTER — Ambulatory Visit: Payer: Medicare Other | Admitting: Urology

## 2020-08-25 ENCOUNTER — Ambulatory Visit (HOSPITAL_COMMUNITY)
Admission: RE | Admit: 2020-08-25 | Discharge: 2020-08-25 | Disposition: A | Discharge status: Home / Self Care | Payer: Medicare Other | Source: Ambulatory Visit | Attending: Urology | Admitting: Urology

## 2020-08-25 VITALS — BP 114/73 | HR 114 | Temp 98.0°F | Ht 67.0 in | Wt 215.0 lb

## 2020-08-25 DIAGNOSIS — N200 Calculus of kidney: Secondary | ICD-10-CM | POA: Insufficient documentation

## 2020-08-25 LAB — URINALYSIS, ROUTINE W REFLEX MICROSCOPIC
Bilirubin, UA: NEGATIVE
Leukocytes,UA: NEGATIVE
Nitrite, UA: NEGATIVE
Protein,UA: NEGATIVE
RBC, UA: NEGATIVE
Specific Gravity, UA: 1.025 (ref 1.005–1.030)
Urobilinogen, Ur: 0.2 mg/dL (ref 0.2–1.0)
pH, UA: 5.5 (ref 5.0–7.5)

## 2020-08-25 MED ORDER — TAMSULOSIN HCL 0.4 MG PO CAPS: 0.4000 mg | ORAL_CAPSULE | Freq: Every day | ORAL | 0 refills | Status: DC

## 2020-08-25 NOTE — Progress Notes (Signed)
Urological Symptom Review  Patient is experiencing the following symptoms: Trouble starting stream Have to strain to urinate  Kidney stones   Review of Systems  Gastrointestinal (upper)  : Nausea  Gastrointestinal (lower) : Constipation  Constitutional : Night Sweats  Skin: Negative for skin symptoms  Eyes: Negative for eye symptoms  Ear/Nose/Throat : Negative for Ear/Nose/Throat symptoms  Hematologic/Lymphatic: Negative for Hematologic/Lymphatic symptoms  Cardiovascular : Negative for cardiovascular symptoms  Respiratory : Negative for respiratory symptoms  Endocrine: Negative for endocrine symptoms  Musculoskeletal: Joint pain  Neurological: Headaches  Psychologic: Anxiety

## 2020-08-25 NOTE — Progress Notes (Signed)
08/25/2020 10:09 AM   Shaun Harris [birth year] [id number]  Referring provider: Erven Colla, DO [place],  [place] [address]  nephrolithiasis  HPI: [name] is a 55yo here for followup for nephrolithiasis. He underwent left ESWL 2 weeks ago. He has not passed any fragments. He has mild left flank pain with associated nausea. No vomiting. He has issues with constipation and takes dulcolax and miralax.    PMH: Past Medical History:  Diagnosis Date  . Anxiety   . Arthritis   . Cerebral vasculitis   . Chronic headaches    constant since possible stroke in 1997,"Vasculitis of brain with increased pressure of brain ".  . COPD (chronic obstructive pulmonary disease) ([place])   . COPD (chronic obstructive pulmonary disease) ([place])   . Diabetes mellitus   . Hyperlipidemia   . Hypertension   . Hypertriglyceridemia   . Sleep apnea    has but doesnt use, cannot tolerate; PCP aware  . Stroke [facility])    pt states,"they arent sure if i had a stroke in 1997, there is still a question about that with my doctors". No deficits.  . Tobacco abuse   . Ulcerative colitis ([place])   . Ulcerative colitis ([place])   . Venous stasis     Surgical History: Past Surgical History:  Procedure Laterality Date  . BIOPSY  10/25/2016   Procedure: BIOPSY;  Surgeon: Shaun Houston, MD;  [facility];  Service: Endoscopy;;  colon  . CERVICAL SPINE SURGERY  2008   x2-last surgery 04/24/2016.  Marland Kitchen COLONOSCOPY WITH PROPOFOL N/A 10/25/2016   Procedure: COLONOSCOPY WITH PROPOFOL;  Surgeon: Shaun Houston, MD;  Location: [facility] SU[facility];  Service: Endoscopy;  Laterality: N/A;  7:30  . EXTRACORPOREAL SHOCK WAVE LITHOTRIPSY Left 08/08/2020   Procedure: EXTRACORPOREAL SHOCK WAVE LITHOTRIPSY (ESWL);  Surgeon: Shaun Gustin, MD;  Location: [facility] ORS;  Service: Urology;  Laterality: Left;  . KNEE ARTHROSCOPY  02/25/2012   Procedure: ARTHROSCOPY KNEE;  Surgeon: Shaun Kava, MD;  Location: [facility] ORS;   Service: Orthopedics;  Laterality: Right;  . KNEE ARTHROSCOPY WITH MEDIAL MENISECTOMY Left 12/23/2014   Procedure: LEFT KNEE ARTHROSCOPY WITH PARTIAL MENISECTOMY;  Surgeon: Shaun Kava, MD;  Location: [facility] ORS;  Service: Orthopedics;  Laterality: Left;  . POLYPECTOMY  10/25/2016   Procedure: POLYPECTOMY;  Surgeon: Shaun Houston, MD;  Location: [facility];  Service: Endoscopy;;  colon  . SHOULDER ARTHROSCOPY  2004   left shoulder  . SHOULDER ARTHROSCOPY  2008   right shoulder    Home Medications:  Allergies as of 08/25/2020      Reactions   Gabapentin Other (See Comments)   Increased blood sugar      Medication List       Accurate as of August 25, 2020 10:09 AM. If you have any questions, ask your nurse or doctor.        acetaminophen 500 MG tablet Commonly known as: TYLENOL Take 1,000 mg by mouth every 6 (six) hours as needed for moderate pain.   Aimovig 70 MG/ML Soaj Generic drug: Erenumab-aooe Inject 70 mg into the skin every 30 (thirty) days.   albuterol 108 (90 Base) MCG/ACT inhaler Commonly known as: Ventolin HFA INHALE 2 PUFFS BY MOUTH EVERY 6 HOURS AS NEEDED FOR SHORTNESS OF BREAT H What changed:   how much to take  how to take this  when to take this  reasons to take this  additional instructions   ALPRAZolam  0.5 MG tablet Commonly known as: XANAX Take 0.5 mg by mouth 2 (two) times daily.   aspirin EC 325 MG tablet Take 325 mg by mouth at bedtime.   atorvastatin 20 MG tablet Commonly known as: LIPITOR TAKE 1 TABLET BY MOUTH AT BEDTIME FOR CHOLESTEROL What changed: See the new instructions.   Blood Glucose Monitoring Suppl w/Device Kit Test glucose 4 times daily. E11.9   buPROPion 150 MG 12 hr tablet Commonly known as: WELLBUTRIN SR TAKE 1 TABLET BY MOUTH TWICE DAILY   CENTRUM PO Take 1 tablet by mouth daily.   enalapril 20 MG tablet Commonly known as: VASOTEC TAKE (2) TABLETS BY MOUTH ONCE DAILY. What changed: See the new  instructions.   Fluticasone-Salmeterol 250-50 MCG/DOSE Aepb Commonly known as: Advair Diskus USE 1 INHALATION BY MOUTH EVERY 12 HOURS. <<RINSE MOUTH AFTER USE>>. What changed:   how much to take  how to take this  when to take this  additional instructions   furosemide 20 MG tablet Commonly known as: LASIX Take 1 tablet (20 mg total) by mouth daily.   hydrocortisone 2.5 % rectal cream Commonly known as: Anusol-HC Place 1 application rectally 2 (two) times daily. What changed:   when to take this  reasons to take this   hydrOXYzine 50 MG tablet Commonly known as: ATARAX/VISTARIL Take 50 mg by mouth daily as needed for anxiety.   hyoscyamine 0.125 MG SL tablet Commonly known as: LEVSIN SL TAKE (1) TABLET UNDER TONGUE EVERY FOUR HOURS AS NEEDED FOR CRAMPING. What changed: See the new instructions.   insulin aspart 100 UNIT/ML injection Commonly known as: NovoLOG INJECT S.Q. 4 TO 12 UNITS FOUR TIMES DAILY PER SLIDING SCALE. What changed:   how much to take  how to take this  when to take this  reasons to take this  additional instructions   insulin detemir 100 UNIT/ML injection Commonly known as: Levemir INJECT 40 UNITS SUBCUTANEOUSLY TWICE DAILY. What changed:   how much to take  how to take this  when to take this  additional instructions   Insulin Syringe-Needle U-100 31G X 5/16" 1 ML Misc Commonly known as: TRUEplus Insulin Syringe USE AS DIRECTED   levETIRAcetam 500 MG tablet Commonly known as: KEPPRA TAKE 3 TABLETS BY MOUTH TWICE DAILY   Mesalamine 400 MG Cpdr DR capsule Commonly known as: ASACOL TAKE (1) CAPSULE BY MOUTH FOUR TIMES DAILY What changed:   how much to take  how to take this  when to take this  additional instructions   metFORMIN 500 MG tablet Commonly known as: GLUCOPHAGE TAKE (2) TABLETS QAM AND ONE TABLET IN THE EVENINGS What changed:   how much to take  how to take this  when to take this  additional  instructions   morphine 30 MG 12 hr tablet Commonly known as: MS CONTIN Take 1 tablet (30 mg total) by mouth 2 (two) times daily. May fill 60 days after 09/30/2017   naproxen 500 MG tablet Commonly known as: NAPROSYN Take one tablet bid with food --- breakfast and supper What changed:   how much to take  how to take this  when to take this  additional instructions   niacin 1000 MG CR tablet Commonly known as: NIASPAN TAKE (2) TABLETS BY MOUTH ONCE DAILY. What changed: See the new instructions.   Oxycodone HCl 10 MG Tabs Take 1 tablet by mouth 4 times a day as needed for pain. Do not drive or operate machinery while on this  medicine. What changed:   how much to take  how to take this  when to take this  reasons to take this  additional instructions   Ozempic (1 MG/DOSE) 4 MG/3ML Sopn Generic drug: Semaglutide (1 MG/DOSE) Inject 1 mg as directed every Wednesday.   Pharmacist Choice Alcohol Pads Reported on 01/23/2016   promethazine 25 MG tablet Commonly known as: PHENERGAN Take 0.5 tablets (12.5 mg total) by mouth every 6 (six) hours as needed for nausea or vomiting. What changed: how much to take   tamsulosin 0.4 MG Caps capsule Commonly known as: FLOMAX Take 1 capsule (0.4 mg total) by mouth daily.   terazosin 10 MG capsule Commonly known as: HYTRIN TAKE (1) CAPSULE BY MOUTH ONCE DAILY. What changed: See the new instructions.   tiZANidine 4 MG tablet Commonly known as: ZANAFLEX Take 1 tablet (4 mg total) by mouth 3 (three) times daily as needed for muscle spasms.   True Metrix Blood Glucose Test test strip Generic drug: glucose blood CHECK BLOOD SUGAR 4 TIMES DAILY.   vitamin C 500 MG tablet Commonly known as: ASCORBIC ACID Take 500 mg by mouth daily.       Allergies:  Allergies  Allergen Reactions  . Gabapentin Other (See Comments)    Increased blood sugar    Family History: Family History  Problem Relation Age of Onset  .  Hypertension Father   . Diabetes Father   . Heart disease Father   . Heart disease Maternal Grandfather   . Heart attack Other   . Heart disease Brother        both brothers have heart disease  . Hypertension Sister     Social History:  reports that he has been smoking cigarettes. He has a 35.00 pack-year smoking history. He has never used smokeless tobacco. He reports that he does not drink alcohol and does not use drugs.  ROS: All other review of systems were reviewed and are negative except what is noted above in HPI  Physical Exam: BP 114/73   Pulse (!) 114   Temp 98 F (36.7 C)   Ht '5\' 7"'  (1.702 m)   Wt 215 lb (97.5 kg)   BMI 33.67 kg/m   Constitutional:  Alert and oriented, No acute distress. HEENT: [place] AT, moist mucus membranes.  Trachea midline, no masses. Cardiovascular: No clubbing, cyanosis, or edema. Respiratory: Normal respiratory effort, no increased work of breathing. GI: Abdomen is soft, nontender, nondistended, no abdominal masses GU: No CVA tenderness.  Lymph: No cervical or inguinal lymphadenopathy. Skin: No rashes, bruises or suspicious lesions. Neurologic: Grossly intact, no focal deficits, moving all 4 extremities. Psychiatric: Normal mood and affect.  Laboratory Data: Lab Results  Component Value Date   WBC 7.2 07/22/2020   HGB 14.2 07/22/2020   HCT 43.4 07/22/2020   MCV 93.7 07/22/2020   PLT 226 07/22/2020    Lab Results  Component Value Date   CREATININE 1.12 07/22/2020    No results found for: PSA  No results found for: TESTOSTERONE  Lab Results  Component Value Date   HGBA1C 5.9 (A) 12/29/2019    Urinalysis    Component Value Date/Time   COLORURINE YELLOW 07/21/2020 2356   APPEARANCEUR Clear 07/25/2020 1635   LABSPEC 1.020 07/21/2020 2356   PHURINE 6.0 07/21/2020 2356   GLUCOSEU Trace (A) 07/25/2020 1635   HGBUR SMALL (A) 07/21/2020 2356   BILIRUBINUR Negative 07/25/2020 1635   KETONESUR >80 (A) 07/21/2020 2356    PROTEINUR 1+ (A)  07/25/2020 1635   PROTEINUR NEGATIVE 07/21/2020 2356   UROBILINOGEN 0.2 12/21/2014 0845   NITRITE Negative 07/25/2020 1635   NITRITE NEGATIVE 07/21/2020 2356   LEUKOCYTESUR Negative 07/25/2020 1635   LEUKOCYTESUR NEGATIVE 07/21/2020 2356    Lab Results  Component Value Date   LABMICR See below: 07/25/2020   WBCUA None seen 07/25/2020   LABEPIT None seen 07/25/2020   MUCUS Present 07/25/2020   BACTERIA Few 07/25/2020    Pertinent Imaging: KUB today: Images reviewed and discussed with the patient Results for orders placed during the hospital encounter of 08/25/20  DG Abd 1 View  Narrative CLINICAL DATA:  Post lithotripsy  EXAM: ABDOMEN - 1 VIEW  COMPARISON:  08/08/2020  FINDINGS: Bowel gas overlies kidneys and no calculi are identified. No ureteral calculi. Normal bowel gas pattern.  IMPRESSION: No calculi identified.   Electronically Signed By: Macy Mis M.D. On: 08/25/2020 09:41  No results found for this or any previous visit.  No results found for this or any previous visit.  No results found for this or any previous visit.  No results found for this or any previous visit.  No results found for this or any previous visit.  No results found for this or any previous visit.  No results found for this or any previous visit.   Assessment & Plan:    1. Nephrolithiasis -COntinue medical expulsive therapy. rx for flomax given. RTC 2 weeks with KUB - Urinalysis, Routine w reflex microscopic   No follow-ups on file.  Nicolette Bang, MD  [facility] [place]

## 2020-08-25 NOTE — Patient Instructions (Signed)

## 2020-09-04 ENCOUNTER — Other Ambulatory Visit: Payer: Self-pay | Admitting: Family Medicine

## 2020-09-13 ENCOUNTER — Other Ambulatory Visit: Payer: Self-pay

## 2020-09-13 ENCOUNTER — Encounter: Payer: Self-pay | Admitting: Urology

## 2020-09-13 ENCOUNTER — Ambulatory Visit (INDEPENDENT_AMBULATORY_CARE_PROVIDER_SITE_OTHER): Payer: Medicare Other | Admitting: Urology

## 2020-09-13 ENCOUNTER — Ambulatory Visit (HOSPITAL_COMMUNITY)
Admission: RE | Admit: 2020-09-13 | Discharge: 2020-09-13 | Disposition: A | Discharge status: Home / Self Care | Payer: Medicare Other | Source: Ambulatory Visit | Attending: Urology | Admitting: Urology

## 2020-09-13 VITALS — BP 91/56 | HR 108 | Temp 98.7°F | Ht 67.0 in | Wt 215.0 lb

## 2020-09-13 DIAGNOSIS — N200 Calculus of kidney: Secondary | ICD-10-CM

## 2020-09-13 DIAGNOSIS — R1012 Left upper quadrant pain: Secondary | ICD-10-CM | POA: Diagnosis not present

## 2020-09-13 LAB — URINALYSIS, ROUTINE W REFLEX MICROSCOPIC
Bilirubin, UA: NEGATIVE
Glucose, UA: NEGATIVE
Leukocytes,UA: NEGATIVE
Nitrite, UA: NEGATIVE
Protein,UA: NEGATIVE
RBC, UA: NEGATIVE
Specific Gravity, UA: 1.015 (ref 1.005–1.030)
Urobilinogen, Ur: 0.2 mg/dL (ref 0.2–1.0)
pH, UA: 5.5 (ref 5.0–7.5)

## 2020-09-13 NOTE — Patient Instructions (Signed)

## 2020-09-13 NOTE — Progress Notes (Signed)
09/13/2020 11:16 AM   Shaun Harris 25-Nov-1965 [id number]  Referring provider: Erven Colla, DO [place],  [place] [address]  Nephrolithiasis  HPI: [name] is a 55yo here for followup for nephrolithiasis. He has not passed any fragments since last visit. He is having intermittent left flank pain. UA normal. No fevers.    PMH: Past Medical History:  Diagnosis Date  . Anxiety   . Arthritis   . Cerebral vasculitis   . Chronic headaches    constant since possible stroke in 1997,"Vasculitis of brain with increased pressure of brain ".  . COPD (chronic obstructive pulmonary disease) ([place])   . COPD (chronic obstructive pulmonary disease) ([place])   . Diabetes mellitus   . Hyperlipidemia   . Hypertension   . Hypertriglyceridemia   . Sleep apnea    has but doesnt use, cannot tolerate; PCP aware  . Stroke [facility])    pt states,"they arent sure if i had a stroke in 1997, there is still a question about that with my doctors". No deficits.  . Tobacco abuse   . Ulcerative colitis ([place])   . Ulcerative colitis ([place])   . Venous stasis     Surgical History: Past Surgical History:  Procedure Laterality Date  . BIOPSY  10/25/2016   Procedure: BIOPSY;  Surgeon: Rogene Houston, MD;  [facility];  Service: Endoscopy;;  colon  . CERVICAL SPINE SURGERY  2008   x2-last surgery 04/24/2016.  Marland Kitchen COLONOSCOPY WITH PROPOFOL N/A 10/25/2016   Procedure: COLONOSCOPY WITH PROPOFOL;  Surgeon: Rogene Houston, MD;  Location: [facility];  Service: Endoscopy;  Laterality: N/A;  7:30  . EXTRACORPOREAL SHOCK WAVE LITHOTRIPSY Left 08/08/2020   Procedure: EXTRACORPOREAL SHOCK WAVE LITHOTRIPSY (ESWL);  Surgeon: Cleon Gustin, MD;  Location: [facility] ORS;  Service: Urology;  Laterality: Left;  . KNEE ARTHROSCOPY  02/25/2012   Procedure: ARTHROSCOPY KNEE;  Surgeon: Sanjuana Kava, MD;  Location: [facility] ORS;  Service: Orthopedics;  Laterality: Right;  . KNEE ARTHROSCOPY WITH MEDIAL MENISECTOMY  Left 12/23/2014   Procedure: LEFT KNEE ARTHROSCOPY WITH PARTIAL MENISECTOMY;  Surgeon: Sanjuana Kava, MD;  Location: [facility] ORS;  Service: Orthopedics;  Laterality: Left;  . POLYPECTOMY  10/25/2016   Procedure: POLYPECTOMY;  Surgeon: Rogene Houston, MD;  Location: [facility];  Service: Endoscopy;;  colon  . SHOULDER ARTHROSCOPY  2004   left shoulder  . SHOULDER ARTHROSCOPY  2008   right shoulder    Home Medications:  Allergies as of 09/13/2020      Reactions   Gabapentin Other (See Comments)   Increased blood sugar      Medication List       Accurate as of September 13, 2020 11:16 AM. If you have any questions, ask your nurse or doctor.        acetaminophen 500 MG tablet Commonly known as: TYLENOL Take 1,000 mg by mouth every 6 (six) hours as needed for moderate pain.   Aimovig 70 MG/ML Soaj Generic drug: Erenumab-aooe Inject 70 mg into the skin every 30 (thirty) days.   albuterol 108 (90 Base) MCG/ACT inhaler Commonly known as: Ventolin HFA INHALE 2 PUFFS BY MOUTH EVERY 6 HOURS AS NEEDED FOR SHORTNESS OF BREAT H What changed:   how much to take  how to take this  when to take this  reasons to take this  additional instructions   ALPRAZolam 0.5 MG tablet Commonly known as: XANAX Take 0.5 mg by mouth 2 (two)  times daily.   aspirin EC 325 MG tablet Take 325 mg by mouth at bedtime.   atorvastatin 20 MG tablet Commonly known as: LIPITOR TAKE 1 TABLET BY MOUTH AT BEDTIME FOR CHOLESTEROL What changed: See the new instructions.   Blood Glucose Monitoring Suppl w/Device Kit Test glucose 4 times daily. E11.9   buPROPion 150 MG 12 hr tablet Commonly known as: WELLBUTRIN SR TAKE 1 TABLET BY MOUTH TWICE DAILY   CENTRUM PO Take 1 tablet by mouth daily.   enalapril 20 MG tablet Commonly known as: VASOTEC TAKE (2) TABLETS BY MOUTH ONCE DAILY. What changed: See the new instructions.   Fluticasone-Salmeterol 250-50 MCG/DOSE Aepb Commonly known as: Advair Diskus USE 1  INHALATION BY MOUTH EVERY 12 HOURS. <<RINSE MOUTH AFTER USE>>. What changed:   how much to take  how to take this  when to take this  additional instructions   furosemide 20 MG tablet Commonly known as: LASIX Take 1 tablet (20 mg total) by mouth daily.   hydrocortisone 2.5 % rectal cream Commonly known as: Anusol-HC Place 1 application rectally 2 (two) times daily. What changed:   when to take this  reasons to take this   hydrOXYzine 50 MG tablet Commonly known as: ATARAX/VISTARIL Take 50 mg by mouth daily as needed for anxiety.   hyoscyamine 0.125 MG SL tablet Commonly known as: LEVSIN SL TAKE (1) TABLET UNDER TONGUE EVERY FOUR HOURS AS NEEDED FOR CRAMPING. What changed: See the new instructions.   insulin aspart 100 UNIT/ML injection Commonly known as: NovoLOG INJECT S.Q. 4 TO 12 UNITS FOUR TIMES DAILY PER SLIDING SCALE. What changed:   how much to take  how to take this  when to take this  reasons to take this  additional instructions   insulin detemir 100 UNIT/ML injection Commonly known as: Levemir INJECT 40 UNITS SUBCUTANEOUSLY TWICE DAILY. What changed:   how much to take  how to take this  when to take this  additional instructions   Insulin Syringe-Needle U-100 31G X 5/16" 1 ML Misc Commonly known as: TRUEplus Insulin Syringe USE AS DIRECTED   levETIRAcetam 500 MG tablet Commonly known as: KEPPRA TAKE 3 TABLETS BY MOUTH TWICE DAILY   Mesalamine 400 MG Cpdr DR capsule Commonly known as: ASACOL TAKE (1) CAPSULE BY MOUTH FOUR TIMES DAILY   metFORMIN 500 MG tablet Commonly known as: GLUCOPHAGE TAKE (2) TABLETS QAM AND ONE TABLET IN THE EVENINGS What changed:   how much to take  how to take this  when to take this  additional instructions   morphine 30 MG 12 hr tablet Commonly known as: MS CONTIN Take 1 tablet (30 mg total) by mouth 2 (two) times daily. May fill 60 days after 09/30/2017   naproxen 500 MG tablet Commonly  known as: NAPROSYN Take one tablet bid with food --- breakfast and supper What changed:   how much to take  how to take this  when to take this  additional instructions   niacin 1000 MG CR tablet Commonly known as: NIASPAN TAKE (2) TABLETS BY MOUTH ONCE DAILY. What changed: See the new instructions.   Oxycodone HCl 10 MG Tabs Take 1 tablet by mouth 4 times a day as needed for pain. Do not drive or operate machinery while on this medicine. What changed:   how much to take  how to take this  when to take this  reasons to take this  additional instructions   Ozempic (1 MG/DOSE) 4 MG/3ML Sopn  Generic drug: Semaglutide (1 MG/DOSE) Inject 1 mg as directed every Wednesday.   Pharmacist Choice Alcohol Pads Reported on 01/23/2016   promethazine 25 MG tablet Commonly known as: PHENERGAN Take 0.5 tablets (12.5 mg total) by mouth every 6 (six) hours as needed for nausea or vomiting. What changed: how much to take   tamsulosin 0.4 MG Caps capsule Commonly known as: FLOMAX Take 1 capsule (0.4 mg total) by mouth daily.   terazosin 10 MG capsule Commonly known as: HYTRIN TAKE (1) CAPSULE BY MOUTH ONCE DAILY. What changed: See the new instructions.   tiZANidine 4 MG tablet Commonly known as: ZANAFLEX Take 1 tablet (4 mg total) by mouth 3 (three) times daily as needed for muscle spasms.   True Metrix Blood Glucose Test test strip Generic drug: glucose blood CHECK BLOOD SUGAR 4 TIMES DAILY.   vitamin C 500 MG tablet Commonly known as: ASCORBIC ACID Take 500 mg by mouth daily.       Allergies:  Allergies  Allergen Reactions  . Gabapentin Other (See Comments)    Increased blood sugar    Family History: Family History  Problem Relation Age of Onset  . Hypertension Father   . Diabetes Father   . Heart disease Father   . Heart disease Maternal Grandfather   . Heart attack Other   . Heart disease Brother        both brothers have heart disease  . Hypertension  Sister     Social History:  reports that he has been smoking cigarettes. He has a 35.00 pack-year smoking history. He has never used smokeless tobacco. He reports that he does not drink alcohol and does not use drugs.  ROS: All other review of systems were reviewed and are negative except what is noted above in HPI  Physical Exam: BP (!) 91/56   Pulse (!) 108   Temp 98.7 F (37.1 C)   Ht '5\' 7"'  (1.702 m)   Wt 215 lb (97.5 kg)   BMI 33.67 kg/m   Constitutional:  Alert and oriented, No acute distress. HEENT: [place] AT, moist mucus membranes.  Trachea midline, no masses. Cardiovascular: No clubbing, cyanosis, or edema. Respiratory: Normal respiratory effort, no increased work of breathing. GI: Abdomen is soft, nontender, nondistended, no abdominal masses GU: No CVA tenderness.  Lymph: No cervical or inguinal lymphadenopathy. Skin: No rashes, bruises or suspicious lesions. Neurologic: Grossly intact, no focal deficits, moving all 4 extremities. Psychiatric: Normal mood and affect.  Laboratory Data: Lab Results  Component Value Date   WBC 7.2 07/22/2020   HGB 14.2 07/22/2020   HCT 43.4 07/22/2020   MCV 93.7 07/22/2020   PLT 226 07/22/2020    Lab Results  Component Value Date   CREATININE 1.12 07/22/2020    No results found for: PSA  No results found for: TESTOSTERONE  Lab Results  Component Value Date   HGBA1C 5.9 (A) 12/29/2019    Urinalysis    Component Value Date/Time   COLORURINE YELLOW 07/21/2020 2356   APPEARANCEUR Clear 08/25/2020 0947   LABSPEC 1.020 07/21/2020 2356   PHURINE 6.0 07/21/2020 2356   GLUCOSEU 1+ (A) 08/25/2020 0947   HGBUR SMALL (A) 07/21/2020 2356   BILIRUBINUR Negative 08/25/2020 0947   KETONESUR >80 (A) 07/21/2020 2356   PROTEINUR Negative 08/25/2020 0947   PROTEINUR NEGATIVE 07/21/2020 2356   UROBILINOGEN 0.2 12/21/2014 0845   NITRITE Negative 08/25/2020 0947   NITRITE NEGATIVE 07/21/2020 2356   LEUKOCYTESUR Negative 08/25/2020 0947    LEUKOCYTESUR  NEGATIVE 07/21/2020 2356    Lab Results  Component Value Date   LABMICR Comment 08/25/2020   WBCUA None seen 07/25/2020   LABEPIT None seen 07/25/2020   MUCUS Present 07/25/2020   BACTERIA Few 07/25/2020    Pertinent Imaging: KUb today: Images reviewed and discussed with the patient Results for orders placed during the hospital encounter of 08/25/20  DG Abd 1 View  Narrative CLINICAL DATA:  Post lithotripsy  EXAM: ABDOMEN - 1 VIEW  COMPARISON:  08/08/2020  FINDINGS: Bowel gas overlies kidneys and no calculi are identified. No ureteral calculi. Normal bowel gas pattern.  IMPRESSION: No calculi identified.   Electronically Signed By: Macy Mis M.D. On: 08/25/2020 09:41  No results found for this or any previous visit.  No results found for this or any previous visit.  No results found for this or any previous visit.  No results found for this or any previous visit.  No results found for this or any previous visit.  No results found for this or any previous visit.  No results found for this or any previous visit.   Assessment & Plan:    1. Nephrolithiasis -CT stone study, will call with results - Urinalysis, Routine w reflex microscopic   No follow-ups on file.  Nicolette Bang, MD  [facility] Urology [place]

## 2020-09-13 NOTE — Progress Notes (Signed)
Urological Symptom Review  Patient is experiencing the following symptoms: Kidney stones   Review of Systems  Gastrointestinal (upper)  : Nausea  Gastrointestinal (lower) : Constipation  Constitutional : Night Sweats  Skin: Negative for skin symptoms  Eyes: Negative for eye symptoms  Ear/Nose/Throat : Negative for Ear/Nose/Throat symptoms  Hematologic/Lymphatic: Negative for Hematologic/Lymphatic symptoms  Cardiovascular : Negative for cardiovascular symptoms  Respiratory : Negative for respiratory symptoms  Endocrine: Negative for endocrine symptoms  Musculoskeletal: Joint pain  Neurological: Headaches  Psychologic: anxiety

## 2020-09-14 ENCOUNTER — Ambulatory Visit (HOSPITAL_COMMUNITY)
Admission: RE | Admit: 2020-09-14 | Discharge: 2020-09-14 | Disposition: A | Discharge status: Home / Self Care | Payer: Medicare Other | Source: Ambulatory Visit | Attending: Urology | Admitting: Urology

## 2020-09-14 DIAGNOSIS — R1012 Left upper quadrant pain: Secondary | ICD-10-CM | POA: Diagnosis not present

## 2020-09-14 NOTE — Progress Notes (Signed)
He passed is left ureteral stone. I will see him back in 1 month

## 2020-09-20 ENCOUNTER — Ambulatory Visit (INDEPENDENT_AMBULATORY_CARE_PROVIDER_SITE_OTHER): Payer: Medicare Other | Admitting: Urology

## 2020-09-20 ENCOUNTER — Other Ambulatory Visit: Payer: Self-pay

## 2020-09-20 ENCOUNTER — Encounter: Payer: Self-pay | Admitting: Urology

## 2020-09-20 VITALS — BP 129/76 | HR 125 | Temp 98.3°F | Ht 67.0 in | Wt 215.0 lb

## 2020-09-20 DIAGNOSIS — N200 Calculus of kidney: Secondary | ICD-10-CM

## 2020-09-20 LAB — URINALYSIS, ROUTINE W REFLEX MICROSCOPIC
Bilirubin, UA: NEGATIVE
Glucose, UA: NEGATIVE
Leukocytes,UA: NEGATIVE
Nitrite, UA: NEGATIVE
Protein,UA: NEGATIVE
RBC, UA: NEGATIVE
Specific Gravity, UA: 1.02 (ref 1.005–1.030)
Urobilinogen, Ur: 0.2 mg/dL (ref 0.2–1.0)
pH, UA: 6 (ref 5.0–7.5)

## 2020-09-20 NOTE — Progress Notes (Signed)
Urological Symptom Review  Patient is experiencing the following symptoms: Kidney stones   Review of Systems  Gastrointestinal (upper)  : Nausea  Gastrointestinal (lower) : Constipation  Constitutional : Night Sweats  Skin: Negative for skin symptoms  Eyes: Negative for eye symptoms  Ear/Nose/Throat : Negative for Ear/Nose/Throat symptoms  Hematologic/Lymphatic: Negative for Hematologic/Lymphatic symptoms  Cardiovascular : Negative for cardiovascular symptoms  Respiratory : Negative for respiratory symptoms  Endocrine: Negative for endocrine symptoms  Musculoskeletal: Joint pain  Neurological: Headaches  Psychologic: Anxiety

## 2020-09-20 NOTE — Progress Notes (Signed)
09/20/2020 1:34 PM   Shaun Harris [birth year] [id number]  Referring provider: Erven Colla, DO [place],  [place] [address]  Followup nephrolithiasis  HPI: [name] is a 55yo here for followup for nephrolithiasis. CT scan 09/14/2020 showed no ureteral calculi. He continues to have left lower quadrant abdominal pain that is dull. He is severely constipated and used 3 fleets enemas this morning. Prior to his stone event he never had constipation.  No flank pain   PMH: Past Medical History:  Diagnosis Date  . Anxiety   . Arthritis   . Cerebral vasculitis   . Chronic headaches    constant since possible stroke in 1997,"Vasculitis of brain with increased pressure of brain ".  . COPD (chronic obstructive pulmonary disease) ([place])   . COPD (chronic obstructive pulmonary disease) ([place])   . Diabetes mellitus   . Hyperlipidemia   . Hypertension   . Hypertriglyceridemia   . Sleep apnea    has but doesnt use, cannot tolerate; PCP aware  . Stroke [facility])    pt states,"they arent sure if i had a stroke in 1997, there is still a question about that with my doctors". No deficits.  . Tobacco abuse   . Ulcerative colitis ([place])   . Ulcerative colitis ([place])   . Venous stasis     Surgical History: Past Surgical History:  Procedure Laterality Date  . BIOPSY  10/25/2016   Procedure: BIOPSY;  Surgeon: Rogene Houston, MD;  [facility]: [facility];  Service: Endoscopy;;  colon  . CERVICAL SPINE SURGERY  2008   x2-last surgery 04/24/2016.  Marland Kitchen COLONOSCOPY WITH PROPOFOL N/A 10/25/2016   Procedure: COLONOSCOPY WITH PROPOFOL;  Surgeon: Rogene Houston, MD;  Location: [facility];  Service: Endoscopy;  Laterality: N/A;  7:30  . EXTRACORPOREAL SHOCK WAVE LITHOTRIPSY Left 08/08/2020   Procedure: EXTRACORPOREAL SHOCK WAVE LITHOTRIPSY (ESWL);  Surgeon: Cleon Gustin, MD;  Location: [facility] ORS;  Service: Urology;  Laterality: Left;  . KNEE ARTHROSCOPY  02/25/2012   Procedure: ARTHROSCOPY  KNEE;  Surgeon: Sanjuana Kava, MD;  Location: [facility];  Service: Orthopedics;  Laterality: Right;  . KNEE ARTHROSCOPY WITH MEDIAL MENISECTOMY Left 12/23/2014   Procedure: LEFT KNEE ARTHROSCOPY WITH PARTIAL MENISECTOMY;  Surgeon: Sanjuana Kava, MD;  Location: [facility];  Service: Orthopedics;  Laterality: Left;  . POLYPECTOMY  10/25/2016   Procedure: POLYPECTOMY;  Surgeon: Rogene Houston, MD;  Location: [facility];  Service: Endoscopy;;  colon  . SHOULDER ARTHROSCOPY  2004   left shoulder  . SHOULDER ARTHROSCOPY  2008   right shoulder    Home Medications:  Allergies as of 09/20/2020      Reactions   Gabapentin Other (See Comments)   Increased blood sugar      Medication List       Accurate as of September 20, 2020  1:34 PM. If you have any questions, ask your nurse or doctor.        acetaminophen 500 MG tablet Commonly known as: TYLENOL Take 1,000 mg by mouth every 6 (six) hours as needed for moderate pain.   Aimovig 70 MG/ML Soaj Generic drug: Erenumab-aooe Inject 70 mg into the skin every 30 (thirty) days.   albuterol 108 (90 Base) MCG/ACT inhaler Commonly known as: Ventolin HFA INHALE 2 PUFFS BY MOUTH EVERY 6 HOURS AS NEEDED FOR SHORTNESS OF BREAT H What changed:   how much to take  how to take this  when to take this  reasons to take this  additional instructions   ALPRAZolam 0.5 MG tablet Commonly known as: XANAX Take 0.5 mg by mouth 2 (two) times daily.   aspirin EC 325 MG tablet Take 325 mg by mouth at bedtime.   atorvastatin 20 MG tablet Commonly known as: LIPITOR TAKE 1 TABLET BY MOUTH AT BEDTIME FOR CHOLESTEROL What changed: See the new instructions.   Blood Glucose Monitoring Suppl w/Device Kit Test glucose 4 times daily. E11.9   buPROPion 150 MG 12 hr tablet Commonly known as: WELLBUTRIN SR TAKE 1 TABLET BY MOUTH TWICE DAILY   CENTRUM PO Take 1 tablet by mouth daily.   enalapril 20 MG tablet Commonly known as: VASOTEC TAKE (2) TABLETS BY MOUTH  ONCE DAILY. What changed: See the new instructions.   Fluticasone-Salmeterol 250-50 MCG/DOSE Aepb Commonly known as: Advair Diskus USE 1 INHALATION BY MOUTH EVERY 12 HOURS. <<RINSE MOUTH AFTER USE>>. What changed:   how much to take  how to take this  when to take this  additional instructions   furosemide 20 MG tablet Commonly known as: LASIX Take 1 tablet (20 mg total) by mouth daily.   hydrocortisone 2.5 % rectal cream Commonly known as: Anusol-HC Place 1 application rectally 2 (two) times daily. What changed:   when to take this  reasons to take this   hydrOXYzine 50 MG tablet Commonly known as: ATARAX/VISTARIL Take 50 mg by mouth daily as needed for anxiety.   hyoscyamine 0.125 MG SL tablet Commonly known as: LEVSIN SL TAKE (1) TABLET UNDER TONGUE EVERY FOUR HOURS AS NEEDED FOR CRAMPING. What changed: See the new instructions.   insulin aspart 100 UNIT/ML injection Commonly known as: NovoLOG INJECT S.Q. 4 TO 12 UNITS FOUR TIMES DAILY PER SLIDING SCALE. What changed:   how much to take  how to take this  when to take this  reasons to take this  additional instructions   insulin detemir 100 UNIT/ML injection Commonly known as: Levemir INJECT 40 UNITS SUBCUTANEOUSLY TWICE DAILY. What changed:   how much to take  how to take this  when to take this  additional instructions   Insulin Syringe-Needle U-100 31G X 5/16" 1 ML Misc Commonly known as: TRUEplus Insulin Syringe USE AS DIRECTED   levETIRAcetam 500 MG tablet Commonly known as: KEPPRA TAKE 3 TABLETS BY MOUTH TWICE DAILY   Mesalamine 400 MG Cpdr DR capsule Commonly known as: ASACOL TAKE (1) CAPSULE BY MOUTH FOUR TIMES DAILY   metFORMIN 500 MG tablet Commonly known as: GLUCOPHAGE TAKE (2) TABLETS QAM AND ONE TABLET IN THE EVENINGS What changed:   how much to take  how to take this  when to take this  additional instructions   morphine 30 MG 12 hr tablet Commonly known as:  MS CONTIN Take 1 tablet (30 mg total) by mouth 2 (two) times daily. May fill 60 days after 09/30/2017   naproxen 500 MG tablet Commonly known as: NAPROSYN Take one tablet bid with food --- breakfast and supper What changed:   how much to take  how to take this  when to take this  additional instructions   niacin 1000 MG CR tablet Commonly known as: NIASPAN TAKE (2) TABLETS BY MOUTH ONCE DAILY. What changed: See the new instructions.   Oxycodone HCl 10 MG Tabs Take 1 tablet by mouth 4 times a day as needed for pain. Do not drive or operate machinery while on this medicine. What changed:   how much to take  how  to take this  when to take this  reasons to take this  additional instructions   Ozempic (1 MG/DOSE) 4 MG/3ML Sopn Generic drug: Semaglutide (1 MG/DOSE) Inject 1 mg as directed every Wednesday.   Pharmacist Choice Alcohol Pads Reported on 01/23/2016   promethazine 25 MG tablet Commonly known as: PHENERGAN Take 0.5 tablets (12.5 mg total) by mouth every 6 (six) hours as needed for nausea or vomiting. What changed: how much to take   tamsulosin 0.4 MG Caps capsule Commonly known as: FLOMAX Take 1 capsule (0.4 mg total) by mouth daily.   terazosin 10 MG capsule Commonly known as: HYTRIN TAKE (1) CAPSULE BY MOUTH ONCE DAILY. What changed: See the new instructions.   tiZANidine 4 MG tablet Commonly known as: ZANAFLEX Take 1 tablet (4 mg total) by mouth 3 (three) times daily as needed for muscle spasms.   True Metrix Blood Glucose Test test strip Generic drug: glucose blood CHECK BLOOD SUGAR 4 TIMES DAILY.   vitamin C 500 MG tablet Commonly known as: ASCORBIC ACID Take 500 mg by mouth daily.       Allergies:  Allergies  Allergen Reactions  . Gabapentin Other (See Comments)    Increased blood sugar    Family History: Family History  Problem Relation Age of Onset  . Hypertension Father   . Diabetes Father   . Heart disease Father   .  Heart disease Maternal Grandfather   . Heart attack Other   . Heart disease Brother        both brothers have heart disease  . Hypertension Sister     Social History:  reports that he has been smoking cigarettes. He has a 35.00 pack-year smoking history. He has never used smokeless tobacco. He reports that he does not drink alcohol and does not use drugs.  ROS: All other review of systems were reviewed and are negative except what is noted above in HPI  Physical Exam: BP 129/76   Pulse (!) 125   Temp 98.3 F (36.8 C)   Ht '5\' 7"'  (1.702 m)   Wt 215 lb (97.5 kg)   BMI 33.67 kg/m   Constitutional:  Alert and oriented, No acute distress. HEENT: [place] AT, moist mucus membranes.  Trachea midline, no masses. Cardiovascular: No clubbing, cyanosis, or edema. Respiratory: Normal respiratory effort, no increased work of breathing. GI: Abdomen is soft, nontender, nondistended, no abdominal masses GU: No CVA tenderness.  Lymph: No cervical or inguinal lymphadenopathy. Skin: No rashes, bruises or suspicious lesions. Neurologic: Grossly intact, no focal deficits, moving all 4 extremities. Psychiatric: Normal mood and affect.  Laboratory Data: Lab Results  Component Value Date   WBC 7.2 07/22/2020   HGB 14.2 07/22/2020   HCT 43.4 07/22/2020   MCV 93.7 07/22/2020   PLT 226 07/22/2020    Lab Results  Component Value Date   CREATININE 1.12 07/22/2020    No results found for: PSA  No results found for: TESTOSTERONE  Lab Results  Component Value Date   HGBA1C 5.9 (A) 12/29/2019    Urinalysis    Component Value Date/Time   COLORURINE YELLOW 07/21/2020 2356   APPEARANCEUR Clear 09/13/2020 1105   LABSPEC 1.020 07/21/2020 2356   PHURINE 6.0 07/21/2020 2356   GLUCOSEU Negative 09/13/2020 1105   HGBUR SMALL (A) 07/21/2020 2356   BILIRUBINUR Negative 09/13/2020 1105   KETONESUR >80 (A) 07/21/2020 2356   PROTEINUR Negative 09/13/2020 1105   PROTEINUR NEGATIVE 07/21/2020 2356    UROBILINOGEN 0.2 12/21/2014  0845   NITRITE Negative 09/13/2020 1105   NITRITE NEGATIVE 07/21/2020 2356   LEUKOCYTESUR Negative 09/13/2020 1105   LEUKOCYTESUR NEGATIVE 07/21/2020 2356    Lab Results  Component Value Date   LABMICR Comment 09/13/2020   WBCUA None seen 07/25/2020   LABEPIT None seen 07/25/2020   MUCUS Present 07/25/2020   BACTERIA Few 07/25/2020    Pertinent Imaging: CT 09/14/2020: Images reviewed and discussed with the patient  Results for orders placed during the hospital encounter of 09/13/20  Abdomen 1 view (KUB)  Narrative CLINICAL DATA:  Nephrolithiasis  EXAM: ABDOMEN - 1 VIEW  COMPARISON:  08/25/2020  FINDINGS: No definite nephro or urolithiasis identified. Normal abdominal gas pattern. Multiple phleboliths noted within the pelvis.  IMPRESSION: No definite nephro or urolithiasis.   Electronically Signed By: Fidela Salisbury MD On: 09/14/2020 02:35  No results found for this or any previous visit.  No results found for this or any previous visit.  No results found for this or any previous visit.  No results found for this or any previous visit.  No results found for this or any previous visit.  No results found for this or any previous visit.  Results for orders placed during the hospital encounter of 09/14/20  CT RENAL STONE STUDY  Narrative CLINICAL DATA:  LEFT flank pain, history kidney stones, follow-up LEFT kidney stone post lithotripsy  EXAM: CT ABDOMEN AND PELVIS WITHOUT CONTRAST  TECHNIQUE: Multidetector CT imaging of the abdomen and pelvis was performed following the standard protocol without IV contrast. Sagittal and coronal MPR images reconstructed from axial data set. No oral contrast administered.  COMPARISON:  07/22/2020  FINDINGS: Lower chest: Calcified granulomata at lung bases. Minimal pericardial fluid, nonspecific.  Hepatobiliary: Gallbladder and liver normal appearance  Pancreas: Normal  appearance  Spleen: Normal appearance  Adrenals/Urinary Tract: LEFT adrenal thickening without mass. RIGHT adrenal mass 4.3 x 3.9 cm image 24, containing fat, consistent with myelolipoma unchanged. BILATERAL nonobstructing renal calculi. Minimal per nonspecific perinephric stranding, chronic. No hydronephrosis, hydroureter, or ureteral calcification. Bladder unremarkable. Previously identified LEFT UPJ calculus is no longer seen.  Stomach/Bowel: Normal appendix. Food debris in stomach. Bowel loops unremarkable.  Vascular/Lymphatic: Few pelvic phleboliths. Atherosclerotic calcification aorta and iliac arteries without aneurysm. No adenopathy.  Reproductive: Mild prostatic enlargement  Other: No free air or free fluid. Tiny umbilical and LEFT inguinal hernias containing fat.  Musculoskeletal: No acute osseous findings  IMPRESSION: BILATERAL nonobstructing renal calculi.  Previously identified LEFT UPJ calculus no longer S seen.  Stable RIGHT adrenal myelolipoma.  Tiny umbilical and LEFT inguinal hernias containing fat.  No acute intra-abdominal or intrapelvic abnormalities.  Aortic Atherosclerosis (ICD10-I70.0).   Electronically Signed By: Lavonia Dana M.D. On: 09/14/2020 09:43   Assessment & Plan:    1. Nephrolithiasis -RTC 3 months with renal US and KUB - Urinalysis, Routine w reflex microscopic   No follow-ups on file.  Nicolette Bang, MD  [facility] [place]

## 2020-09-26 ENCOUNTER — Other Ambulatory Visit: Payer: Self-pay | Admitting: Family Medicine

## 2020-10-02 ENCOUNTER — Other Ambulatory Visit: Payer: Self-pay

## 2020-10-02 DIAGNOSIS — N200 Calculus of kidney: Secondary | ICD-10-CM

## 2020-10-02 MED ORDER — TAMSULOSIN HCL 0.4 MG PO CAPS: 0.4000 mg | ORAL_CAPSULE | Freq: Every day | ORAL | 5 refills | Status: DC

## 2020-10-03 ENCOUNTER — Other Ambulatory Visit: Payer: Self-pay | Admitting: Family Medicine

## 2020-10-16 ENCOUNTER — Other Ambulatory Visit: Payer: Self-pay | Admitting: Family Medicine

## 2020-10-24 ENCOUNTER — Other Ambulatory Visit: Payer: Self-pay

## 2020-10-24 ENCOUNTER — Encounter: Payer: Self-pay | Admitting: Family Medicine

## 2020-10-24 ENCOUNTER — Ambulatory Visit (INDEPENDENT_AMBULATORY_CARE_PROVIDER_SITE_OTHER): Payer: Medicare Other | Admitting: Family Medicine

## 2020-10-24 VITALS — BP 146/91 | HR 106 | Temp 98.2°F | Ht 67.0 in | Wt 214.4 lb

## 2020-10-24 DIAGNOSIS — Z13220 Encounter for screening for lipoid disorders: Secondary | ICD-10-CM | POA: Diagnosis not present

## 2020-10-24 DIAGNOSIS — E119 Type 2 diabetes mellitus without complications: Secondary | ICD-10-CM

## 2020-10-24 DIAGNOSIS — I10 Essential (primary) hypertension: Secondary | ICD-10-CM | POA: Diagnosis not present

## 2020-10-24 NOTE — Progress Notes (Signed)
Patient ID: Shaun Harris, male    DOB: [birth year], 55 y.o.   MRN: [id number]   Chief Complaint  Patient presents with  . Diabetes   Subjective:    HPI F/u DM2- Pt seeing pain management and doing okay. Taking oxycodone and morphine from his pain doctor.  Having constipation but is improving with the miralax. Now taking miralax 1-2x per day.  Pt had kidney stone on last visit and seeing Dr. Alyson Ingles 3/22. Did have lithotripsy.  Then passed some but others may still be in kidney. Needing to go back for more testing in 6/22.  Pt stating normally having bm due to Ulcerative colitis. Now back to going 4-5 x due to taking miralax.   HTN Pt compliant with BP meds.  No SEs Denies chest pain, sob, LE swelling, or blurry vision.   Medical History Torri has a past medical history of Anxiety, Arthritis, Cerebral vasculitis, Chronic headaches, COPD (chronic obstructive pulmonary disease) ([place]), COPD (chronic obstructive pulmonary disease) ([place]), Diabetes mellitus, Hyperlipidemia, Hypertension, Hypertriglyceridemia, Sleep apnea, Stroke ([place]), Tobacco abuse, Ulcerative colitis ([place]), Ulcerative colitis ([place]), and Venous stasis.   Outpatient Encounter Medications as of 10/24/2020  Medication Sig  . acetaminophen (TYLENOL) 500 MG tablet Take 1,000 mg by mouth every 6 (six) hours as needed for moderate pain.  Marland Kitchen AIMOVIG 70 MG/ML SOAJ Inject 70 mg into the skin every 30 (thirty) days.  Marland Kitchen albuterol (VENTOLIN HFA) 108 (90 Base) MCG/ACT inhaler INHALE 2 PUFFS BY MOUTH EVERY 6 HOURS AS NEEDED FOR SHORTNESS OF BREAT H (Patient taking differently: Inhale 2 puffs into the lungs every 6 (six) hours as needed for wheezing or shortness of breath.)  . Alcohol Swabs (PHARMACIST CHOICE ALCOHOL) PADS Reported on 01/23/2016  . ALPRAZolam (XANAX) 0.5 MG tablet Take 0.5 mg by mouth 2 (two) times daily.  Marland Kitchen aspirin EC 325 MG tablet Take 325 mg by mouth at bedtime.  Marland Kitchen atorvastatin (LIPITOR) 20 MG tablet TAKE 1  TABLET BY MOUTH AT BEDTIME FOR CHOLESTEROL (Patient taking differently: Take 20 mg by mouth daily.)  . Blood Glucose Monitoring Suppl w/Device KIT Test glucose 4 times daily. E11.9  . buPROPion (WELLBUTRIN SR) 150 MG 12 hr tablet TAKE 1 TABLET BY MOUTH TWICE DAILY (Patient taking differently: Take 150 mg by mouth 2 (two) times daily.)  . enalapril (VASOTEC) 20 MG tablet TAKE (2) TABLETS BY MOUTH ONCE DAILY. (Patient taking differently: Take 20 mg by mouth 2 (two) times daily.)  . Fluticasone-Salmeterol (ADVAIR DISKUS) 250-50 MCG/DOSE AEPB USE 1 INHALATION BY MOUTH EVERY 12 HOURS. <<RINSE MOUTH AFTER USE>>. (Patient taking differently: Inhale 1 puff into the lungs in the morning and at bedtime. <<RINSE MOUTH AFTER USE>>.)  . furosemide (LASIX) 20 MG tablet Take 1 tablet (20 mg total) by mouth daily.  . hydrocortisone (ANUSOL-HC) 2.5 % rectal cream Place 1 application rectally 2 (two) times daily. (Patient taking differently: Place 1 application rectally 2 (two) times daily as needed (inflammation).)  . hydrOXYzine (ATARAX/VISTARIL) 50 MG tablet Take 50 mg by mouth daily as needed for anxiety.  . hyoscyamine (LEVSIN SL) 0.125 MG SL tablet TAKE (1) TABLET UNDER TONGUE EVERY FOUR HOURS AS NEEDED FOR CRAMPING. (Patient taking differently: Take 0.125 mg by mouth every 4 (four) hours as needed for cramping.)  . insulin aspart (NOVOLOG) 100 UNIT/ML injection INJECT S.Q. 4 TO 12 UNITS FOUR TIMES DAILY PER SLIDING SCALE.  . insulin detemir (LEVEMIR) 100 UNIT/ML injection INJECT 40 UNITS SUBCUTANEOUSLY TWICE DAILY. (Patient taking differently:  Inject 20-30 Units into the skin 2 (two) times daily.)  . levETIRAcetam (KEPPRA) 500 MG tablet TAKE 3 TABLETS BY MOUTH TWICE DAILY (Patient taking differently: Take 1,500 mg by mouth 2 (two) times daily.)  . Mesalamine (ASACOL) 400 MG CPDR DR capsule TAKE (1) CAPSULE BY MOUTH FOUR TIMES DAILY  . metFORMIN (GLUCOPHAGE) 500 MG tablet TAKE (2) TABLETS QAM AND ONE TABLET IN THE  EVENINGS (Patient taking differently: Take 500-1,000 mg by mouth See admin instructions. Take 1000 mg in the morning and 500 mg at night)  . morphine (MS CONTIN) 30 MG 12 hr tablet Take 1 tablet (30 mg total) by mouth 2 (two) times daily. May fill 60 days after 09/30/2017  . Multiple Vitamins-Minerals (CENTRUM PO) Take 1 tablet by mouth daily.  . naproxen (NAPROSYN) 500 MG tablet Take 1 tablet (500 mg total) by mouth 2 (two) times daily with a meal.  . niacin (NIASPAN) 1000 MG CR tablet TAKE (2) TABLETS BY MOUTH ONCE DAILY. (Patient taking differently: Take 2,000 mg by mouth at bedtime. TAKE (2) TABLETS BY MOUTH ONCE DAILY)  . Oxycodone HCl 10 MG TABS Take 1 tablet by mouth 4 times a day as needed for pain. Do not drive or operate machinery while on this medicine. (Patient taking differently: Take 10 mg by mouth 4 (four) times daily as needed (pain). Do not drive or operate machinery while on this medicine.)  . OZEMPIC, 1 MG/DOSE, 4 MG/3ML SOPN Inject 1 mg as directed every Wednesday.  . promethazine (PHENERGAN) 25 MG tablet Take 0.5 tablets (12.5 mg total) by mouth every 6 (six) hours as needed for nausea or vomiting. (Patient taking differently: Take 25 mg by mouth every 6 (six) hours as needed for nausea or vomiting.)  . tamsulosin (FLOMAX) 0.4 MG CAPS capsule Take 1 capsule (0.4 mg total) by mouth daily.  Marland Kitchen terazosin (HYTRIN) 10 MG capsule TAKE (1) CAPSULE BY MOUTH ONCE DAILY.  Marland Kitchen tiZANidine (ZANAFLEX) 4 MG tablet Take 1 tablet (4 mg total) by mouth 3 (three) times daily as needed for muscle spasms.  . TRUE METRIX BLOOD GLUCOSE TEST test strip CHECK BLOOD SUGAR 4 TIMES DAILY.  Marland Kitchen TRUEPLUS INSULIN SYRINGE 31G X 5/16" 1 ML MISC USE AS DIRECTED  . vitamin C (ASCORBIC ACID) 500 MG tablet Take 500 mg by mouth daily.   No facility-administered encounter medications on file as of 10/24/2020.     Review of Systems  Constitutional: Negative for chills and fever.  HENT: Negative for congestion,  rhinorrhea and sore throat.   Respiratory: Negative for cough, shortness of breath and wheezing.   Cardiovascular: Negative for chest pain and leg swelling.  Gastrointestinal: Negative for abdominal pain, diarrhea, nausea and vomiting.  Genitourinary: Negative for dysuria and frequency.  Skin: Negative for rash.  Neurological: Negative for dizziness, weakness and headaches.     Vitals BP (!) 146/91   Pulse (!) 106   Temp 98.2 F (36.8 C)   Ht '5\' 7"'  (1.702 m)   Wt 214 lb 6.4 oz (97.3 kg)   SpO2 98%   BMI 33.58 kg/m   Objective:   Physical Exam Vitals and nursing note reviewed.  Constitutional:      General: He is not in acute distress.    Appearance: Normal appearance. He is not ill-appearing.  Cardiovascular:     Rate and Rhythm: Normal rate and regular rhythm.     Pulses: Normal pulses.     Heart sounds: Normal heart sounds. No murmur heard.  Pulmonary:     Effort: Pulmonary effort is normal. No respiratory distress.     Breath sounds: Normal breath sounds.  Musculoskeletal:        General: Normal range of motion.  Skin:    General: Skin is warm and dry.     Findings: No rash.  Neurological:     General: No focal deficit present.     Mental Status: He is alert and oriented to person, place, and time.  Psychiatric:        Mood and Affect: Mood normal.        Behavior: Behavior normal.        Thought Content: Thought content normal.        Judgment: Judgment normal.      Assessment and Plan   1. Primary hypertension - CMP14+EGFR  2. Diabetes mellitus without complication (HCC) - MZP56+MRXE - Hemoglobin A1c - Lipid panel - Microalbumin, urine  3. Screening for lipid disorders - Lipid panel    BP Readings from Last 3 Encounters:  10/24/20 (!) 146/91  09/20/20 129/76  09/13/20 (!) 91/56    Pt is medically disabled since 1997.  Pt needing jury duty excuse filled out.   htn- suboptimal, pt had normal bp on last visit in 09/20/20 with other  office.  Will cont to monitor and recheck on next visit.  Labs ordered.  hld- stable. Cont meds  Return in about 3 months (around 01/23/2021).

## 2020-10-25 LAB — LIPID PANEL
Chol/HDL Ratio: 2 ratio (ref 0.0–5.0)
Cholesterol, Total: 97 mg/dL — ABNORMAL LOW (ref 100–199)
HDL: 48 mg/dL (ref >39)
LDL Chol Calc (NIH): 32 mg/dL (ref 0–99)
Triglycerides: 87 mg/dL (ref 0–149)
VLDL Cholesterol Cal: 17 mg/dL (ref 5–40)

## 2020-10-25 LAB — MICROALBUMIN, URINE: Microalbumin, Urine: 6.5 ug/mL

## 2020-10-25 LAB — HEMOGLOBIN A1C
Est. average glucose Bld gHb Est-mCnc: 154 mg/dL
Hgb A1c MFr Bld: 7 % — ABNORMAL HIGH (ref 4.8–5.6)

## 2020-10-25 LAB — CMP14+EGFR
ALT: 14 IU/L (ref 0–44)
AST: 19 IU/L (ref 0–40)
Albumin/Globulin Ratio: 2.4 — ABNORMAL HIGH (ref 1.2–2.2)
Albumin: 5 g/dL — ABNORMAL HIGH (ref 3.8–4.9)
Alkaline Phosphatase: 76 IU/L (ref 44–121)
BUN/Creatinine Ratio: 15 (ref 9–20)
BUN: 19 mg/dL (ref 6–24)
Bilirubin Total: <0.2 mg/dL (ref 0.0–1.2)
CO2: 21 mmol/L (ref 20–29)
Calcium: 9.8 mg/dL (ref 8.7–10.2)
Chloride: 103 mmol/L (ref 96–106)
Creatinine, Ser: 1.28 mg/dL — ABNORMAL HIGH (ref 0.76–1.27)
Globulin, Total: 2.1 g/dL (ref 1.5–4.5)
Glucose: 120 mg/dL — ABNORMAL HIGH (ref 65–99)
Potassium: 4.8 mmol/L (ref 3.5–5.2)
Sodium: 142 mmol/L (ref 134–144)
Total Protein: 7.1 g/dL (ref 6.0–8.5)
eGFR: 67 mL/min/{1.73_m2} (ref >59)

## 2020-11-14 ENCOUNTER — Other Ambulatory Visit: Payer: Self-pay | Admitting: Family Medicine

## 2020-11-27 ENCOUNTER — Other Ambulatory Visit: Payer: Self-pay | Admitting: Family Medicine

## 2020-12-18 ENCOUNTER — Ambulatory Visit (HOSPITAL_COMMUNITY)
Admission: RE | Admit: 2020-12-18 | Discharge: 2020-12-18 | Disposition: A | Discharge status: Home / Self Care | Payer: Medicare Other | Source: Ambulatory Visit | Attending: Urology | Admitting: Urology

## 2020-12-18 DIAGNOSIS — N200 Calculus of kidney: Secondary | ICD-10-CM

## 2020-12-25 ENCOUNTER — Other Ambulatory Visit: Payer: Self-pay

## 2020-12-25 ENCOUNTER — Telehealth (INDEPENDENT_AMBULATORY_CARE_PROVIDER_SITE_OTHER): Payer: Medicare Other | Admitting: Urology

## 2020-12-25 ENCOUNTER — Encounter: Payer: Self-pay | Admitting: Urology

## 2020-12-25 DIAGNOSIS — N200 Calculus of kidney: Secondary | ICD-10-CM | POA: Diagnosis not present

## 2020-12-25 DIAGNOSIS — R3912 Poor urinary stream: Secondary | ICD-10-CM

## 2020-12-25 MED ORDER — TAMSULOSIN HCL 0.4 MG PO CAPS: 0.4000 mg | ORAL_CAPSULE | Freq: Every day | ORAL | 3 refills | Status: DC

## 2020-12-25 NOTE — Patient Instructions (Signed)
Textbook of Natural Medicine (5th ed., pp. 1518-1527.e3). [place], [place]: Elsevier.">  Dietary Guidelines to Help Prevent Kidney Stones Kidney stones are deposits of minerals and salts that form inside your kidneys. Your risk of developing kidney stones may be greater depending on your diet, your lifestyle, the medicines you take, and whether you have certain medical conditions. Most people can lower their chances of developing kidney stones by following the instructions below. Your dietitian may give you more specific instructions depending on your overall health and the type of kidney stones youtend to develop. What are tips for following this plan? Reading food labels  Choose foods with "no salt added" or "low-salt" labels. Limit your salt (sodium) intake to less than 1,500 mg a day. Choose foods with calcium for each meal and snack. Try to eat about 300 mg of calcium at each meal. Foods that contain 200-500 mg of calcium a serving include: 8 oz (237 mL) of milk, calcium-fortifiednon-dairy milk, and calcium-fortifiedfruit juice. Calcium-fortified means that calcium has been added to these drinks. 8 oz (237 mL) of kefir, yogurt, and soy yogurt. 4 oz (114 g) of tofu. 1 oz (28 g) of cheese. 1 cup (150 g) of dried figs. 1 cup (91 g) of cooked broccoli. One 3 oz (85 g) can of sardines or mackerel. Most people need 1,000-1,500 mg of calcium a day. Talk to your dietitian abouthow much calcium is recommended for you. Shopping Buy plenty of fresh fruits and vegetables. Most people do not need to avoid fruits and vegetables, even if these foods contain nutrients that may contribute to kidney stones. When shopping for convenience foods, choose: Whole pieces of fruit. Pre-made salads with dressing on the side. Low-fat fruit and yogurt smoothies. Avoid buying frozen meals or prepared deli foods. These can be high in sodium. Look for foods with live cultures, such as yogurt and kefir. Choose high-fiber  grains, such as whole-wheat breads, oat bran, and wheat cereals. Cooking Do not add salt to food when cooking. Place a salt shaker on the table and allow each person to add his or her own salt to taste. Use vegetable protein, such as beans, textured vegetable protein (TVP), or tofu, instead of meat in pasta, casseroles, and soups. Meal planning Eat less salt, if told by your dietitian. To do this: Avoid eating processed or pre-made food. Avoid eating fast food. Eat less animal protein, including cheese, meat, poultry, or fish, if told by your dietitian. To do this: Limit the number of times you have meat, poultry, fish, or cheese each week. Eat a diet free of meat at least 2 days a week. Eat only one serving each day of meat, poultry, fish, or seafood. When you prepare animal protein, cut pieces into small portion sizes. For most meat and fish, one serving is about the size of the palm of your hand. Eat at least five servings of fresh fruits and vegetables each day. To do this: Keep fruits and vegetables on hand for snacks. Eat one piece of fruit or a handful of berries with breakfast. Have a salad and fruit at lunch. Have two kinds of vegetables at dinner. Limit foods that are high in a substance called oxalate. These include: Spinach (cooked), rhubarb, beets, sweet potatoes, and Swiss chard. Peanuts. Potato chips, french fries, and baked potatoes with skin on. Nuts and nut products. Chocolate. If you regularly take a diuretic medicine, make sure to eat at least 1 or 2 servings of fruits or vegetables that are   high in potassium each day. These include: Avocado. Banana. Orange, prune, carrot, or tomato juice. Baked potato. Cabbage. Beans and split peas. Lifestyle  Drink enough fluid to keep your urine pale yellow. This is the most important thing you can do. Spread your fluid intake throughout the day. If you drink alcohol: Limit how much you use to: 0-1 drink a day for women who  are not pregnant. 0-2 drinks a day for men. Be aware of how much alcohol is in your drink. In the [place] one drink equals one 12 oz bottle of beer (355 mL), one 5 oz glass of wine (148 mL), or one 1 oz glass of hard liquor (44 mL). Lose weight if told by your health care provider. Work with your dietitian to find an eating plan and weight loss strategies that work best for you.  General information Talk to your health care provider and dietitian about taking daily supplements. You may be told the following depending on your health and the cause of your kidney stones: Not to take supplements with vitamin C. To take a calcium supplement. To take a daily probiotic supplement. To take other supplements such as magnesium, fish oil, or vitamin B6. Take over-the-counter and prescription medicines only as told by your health care provider. These include supplements. What foods should I limit? Limit your intake of the following foods, or eat them as told by your dietitian. Vegetables Spinach. Rhubarb. Beets. Canned vegetables. Pickles. Olives. Baked potatoeswith skin. Grains Wheat bran. Baked goods. Salted crackers. Cereals high in sugar. Meats and other proteins Nuts. Nut butters. Large portions of meat, poultry, or fish. Salted, precooked,or cured meats, such as sausages, meat loaves, and hot dogs. Dairy Cheese. Beverages Regular soft drinks. Regular vegetable juice. Seasonings and condiments Seasoning blends with salt. Salad dressings. Soy sauce. Ketchup. Barbecue sauce. Other foods Canned soups. Canned pasta sauce. Casseroles. Pizza. Lasagna. Frozen meals.Potato chips. French fries. The items listed above may not be a complete list of foods and beverages you should limit. Contact a dietitian for more information. What foods should I avoid? Talk to your dietitian about specific foods you should avoid based on the typeof kidney stones you have and your overall health. Fruits Grapefruit. The  item listed above may not be a complete list of foods and beverages you should avoid. Contact a dietitian for more information. Summary Kidney stones are deposits of minerals and salts that form inside your kidneys. You can lower your risk of kidney stones by making changes to your diet. The most important thing you can do is drink enough fluid. Drink enough fluid to keep your urine pale yellow. Talk to your dietitian about how much calcium you should have each day, and eat less salt and animal protein as told by your dietitian. This information is not intended to replace advice given to you by your health care provider. Make sure you discuss any questions you have with your healthcare provider. Document Revised: 06/24/2019 Document Reviewed: 06/24/2019 Elsevier Patient Education  2022 Elsevier Inc.  

## 2020-12-25 NOTE — Progress Notes (Signed)
12/25/2020 1:43 PM   Shaun Harris 30-Apr-1966 [id number]  Referring provider: Erven Colla, DO [address] [place],  [place] [address]  Patient location: home Physician location: office I connected with  Shaun Harris on 12/25/20 by a video enabled telemedicine application and verified that I am speaking with the correct person using two identifiers.   I discussed the limitations of evaluation and management by telemedicine. The patient expressed understanding and agreed to proceed.    Nephrolithiasis   HPI: Shaun Harris is a 55yo here for followup for nephrolithiasis. No stone events since last visit. Renal US 6/7 shows bilateral nonobstructing renal calculi. He has a good urine stream, no straining to urinate. Nocturia 1x. Overall he is happy with his urination. No other complaints today   PMH: Past Medical History:  Diagnosis Date   Anxiety    Arthritis    Cerebral vasculitis    Chronic headaches    constant since possible stroke in 1997,"Vasculitis of brain with increased pressure of brain ".   COPD (chronic obstructive pulmonary disease) (HCC)    COPD (chronic obstructive pulmonary disease) (HCC)    Diabetes mellitus    Hyperlipidemia    Hypertension    Hypertriglyceridemia    Sleep apnea    has but doesnt use, cannot tolerate; PCP aware   Stroke [facility])    pt states,"they arent sure if i had a stroke in 1997, there is still a question about that with my doctors". No deficits.   Tobacco abuse    Ulcerative colitis ([place])    Ulcerative colitis ([place])    Venous stasis     Surgical History: Past Surgical History:  Procedure Laterality Date   BIOPSY  10/25/2016   Procedure: BIOPSY;  Surgeon: Rogene Houston, MD;  [facility];  Service: Endoscopy;;  colon   CERVICAL SPINE SURGERY  2008   x2-last surgery 04/24/2016.   COLONOSCOPY WITH PROPOFOL N/A 10/25/2016   Procedure: COLONOSCOPY WITH PROPOFOL;  Surgeon: Rogene Houston, MD;  Location: [facility];   Service: Endoscopy;  Laterality: N/A;  7:30   EXTRACORPOREAL SHOCK WAVE LITHOTRIPSY Left 08/08/2020   Procedure: EXTRACORPOREAL SHOCK WAVE LITHOTRIPSY (ESWL);  Surgeon: Cleon Gustin, MD;  Location: [facility];  Service: Urology;  Laterality: Left;   KNEE ARTHROSCOPY  02/25/2012   Procedure: ARTHROSCOPY KNEE;  Surgeon: Sanjuana Kava, MD;  Location: [facility];  Service: Orthopedics;  Laterality: Right;   KNEE ARTHROSCOPY WITH MEDIAL MENISECTOMY Left 12/23/2014   Procedure: LEFT KNEE ARTHROSCOPY WITH PARTIAL MENISECTOMY;  Surgeon: Sanjuana Kava, MD;  Location: [facility];  Service: Orthopedics;  Laterality: Left;   POLYPECTOMY  10/25/2016   Procedure: POLYPECTOMY;  Surgeon: Rogene Houston, MD;  [facility];  Service: Endoscopy;;  colon   SHOULDER ARTHROSCOPY  2004   left shoulder   SHOULDER ARTHROSCOPY  2008   right shoulder    Home Medications:  Allergies as of 12/25/2020       Reactions   Gabapentin Other (See Comments)   Increased blood sugar        Medication List        Accurate as of December 25, 2020  1:43 PM. If you have any questions, ask your nurse or doctor.          acetaminophen 500 MG tablet Commonly known as: TYLENOL Take 1,000 mg by mouth every 6 (six) hours as needed for moderate pain.   Advair Diskus 250-50 MCG/ACT Aepb Generic drug:  fluticasone-salmeterol USE 1 INHALATION BY MOUTH EVERY 12 HOURS. <<RINSE MOUTH AFTER USE>>.   Aimovig 70 MG/ML Soaj Generic drug: Erenumab-aooe Inject 70 mg into the skin every 30 (thirty) days.   albuterol 108 (90 Base) MCG/ACT inhaler Commonly known as: VENTOLIN HFA INHALE 2 PUFFS BY MOUTH EVERY 6 HOURS AS NEEDED FOR SHORTNESS OF BREATH.   ALPRAZolam 0.5 MG tablet Commonly known as: XANAX Take 0.5 mg by mouth 2 (two) times daily.   aspirin EC 325 MG tablet Take 325 mg by mouth at bedtime.   atorvastatin 20 MG tablet Commonly known as: LIPITOR TAKE 1 TABLET BY MOUTH AT BEDTIME FOR CHOLESTEROL What changed: See  the new instructions.   Blood Glucose Monitoring Suppl w/Device Kit Test glucose 4 times daily. E11.9   buPROPion 150 MG 12 hr tablet Commonly known as: WELLBUTRIN SR TAKE 1 TABLET BY MOUTH TWICE DAILY   CENTRUM PO Take 1 tablet by mouth daily.   enalapril 20 MG tablet Commonly known as: VASOTEC TAKE (2) TABLETS BY MOUTH ONCE DAILY. What changed: See the new instructions.   furosemide 20 MG tablet Commonly known as: LASIX TAKE 1 TABLET BY MOUTH ONCE DAILY.   hydrocortisone 2.5 % rectal cream Commonly known as: Anusol-HC Place 1 application rectally 2 (two) times daily. What changed:  when to take this reasons to take this   hydrOXYzine 50 MG tablet Commonly known as: ATARAX/VISTARIL Take 50 mg by mouth daily as needed for anxiety.   hyoscyamine 0.125 MG SL tablet Commonly known as: LEVSIN SL TAKE (1) TABLET UNDER TONGUE EVERY FOUR HOURS AS NEEDED FOR CRAMPING. What changed: See the new instructions.   insulin aspart 100 UNIT/ML injection Commonly known as: NovoLOG INJECT S.Q. 4 TO 12 UNITS FOUR TIMES DAILY PER SLIDING SCALE.   NovoLOG 100 UNIT/ML injection Generic drug: insulin aspart INJECT S.Q. 4 TO 12 UNITS FOUR TIMES DAILY PER SLIDING SCALE.   insulin detemir 100 UNIT/ML injection Commonly known as: Levemir INJECT 40 UNITS SUBCUTANEOUSLY TWICE DAILY. What changed:  how much to take how to take this when to take this additional instructions   levETIRAcetam 500 MG tablet Commonly known as: KEPPRA TAKE 3 TABLETS BY MOUTH TWICE DAILY   Mesalamine 400 MG Cpdr DR capsule Commonly known as: ASACOL TAKE (1) CAPSULE BY MOUTH FOUR TIMES DAILY   metFORMIN 500 MG tablet Commonly known as: GLUCOPHAGE TAKE 2 TABLETS BY MOUTH IN THE MORNING AND 1 IN THE EVENING.   morphine 30 MG 12 hr tablet Commonly known as: MS CONTIN Take 1 tablet (30 mg total) by mouth 2 (two) times daily. May fill 60 days after 09/30/2017   naproxen 500 MG tablet Commonly known as:  NAPROSYN TAKE (1) TABLET TWICE A DAY WITH FOOD---BREAKFAST AND SUPPER.   niacin 1000 MG CR tablet Commonly known as: NIASPAN TAKE (2) TABLETS BY MOUTH ONCE DAILY. What changed: See the new instructions.   Oxycodone HCl 10 MG Tabs Take 1 tablet by mouth 4 times a day as needed for pain. Do not drive or operate machinery while on this medicine. What changed:  how much to take how to take this when to take this reasons to take this additional instructions   Ozempic (1 MG/DOSE) 4 MG/3ML Sopn Generic drug: Semaglutide (1 MG/DOSE) Inject 1 mg as directed every Wednesday.   Pharmacist Choice Alcohol Pads Reported on 01/23/2016   promethazine 25 MG tablet Commonly known as: PHENERGAN Take 0.5 tablets (12.5 mg total) by mouth every 6 (six) hours as  needed for nausea or vomiting. What changed: how much to take   tamsulosin 0.4 MG Caps capsule Commonly known as: FLOMAX Take 1 capsule (0.4 mg total) by mouth daily.   terazosin 10 MG capsule Commonly known as: HYTRIN TAKE (1) CAPSULE BY MOUTH ONCE DAILY.   tiZANidine 4 MG tablet Commonly known as: ZANAFLEX TAKE 1 TABLET THREE TIMES DAILY AS NEEDED FOR MUSCLE SPASM   True Metrix Blood Glucose Test test strip Generic drug: glucose blood CHECK BLOOD SUGAR 4 TIMES DAILY.   TRUEplus Insulin Syringe 31G X 5/16" 1 ML Misc Generic drug: Insulin Syringe-Needle U-100 USE AS DIRECTED   vitamin C 500 MG tablet Commonly known as: ASCORBIC ACID Take 500 mg by mouth daily.        Allergies:  Allergies  Allergen Reactions   Gabapentin Other (See Comments)    Increased blood sugar    Family History: Family History  Problem Relation Age of Onset   Hypertension Father    Diabetes Father    Heart disease Father    Heart disease Maternal Grandfather    Heart attack Other    Heart disease Brother        both brothers have heart disease   Hypertension Sister     Social History:  reports that he has been smoking cigarettes. He  has a 35.00 pack-year smoking history. He has never used smokeless tobacco. He reports that he does not drink alcohol and does not use drugs.  ROS: All other review of systems were reviewed and are negative except what is noted above in HPI   Laboratory Data: Lab Results  Component Value Date   WBC 7.2 07/22/2020   HGB 14.2 07/22/2020   HCT 43.4 07/22/2020   MCV 93.7 07/22/2020   PLT 226 07/22/2020    Lab Results  Component Value Date   CREATININE 1.28 (H) 10/24/2020    No results found for: PSA  No results found for: TESTOSTERONE  Lab Results  Component Value Date   HGBA1C 7.0 (H) 10/24/2020    Urinalysis    Component Value Date/Time   COLORURINE YELLOW 07/21/2020 2356   APPEARANCEUR Clear 09/20/2020 1606   LABSPEC 1.020 07/21/2020 2356   PHURINE 6.0 07/21/2020 2356   GLUCOSEU Negative 09/20/2020 1606   HGBUR SMALL (A) 07/21/2020 2356   BILIRUBINUR Negative 09/20/2020 1606   KETONESUR >80 (A) 07/21/2020 2356   PROTEINUR Negative 09/20/2020 1606   PROTEINUR NEGATIVE 07/21/2020 2356   UROBILINOGEN 0.2 12/21/2014 0845   NITRITE Negative 09/20/2020 1606   NITRITE NEGATIVE 07/21/2020 2356   LEUKOCYTESUR Negative 09/20/2020 1606   LEUKOCYTESUR NEGATIVE 07/21/2020 2356    Lab Results  Component Value Date   LABMICR 6.5 10/24/2020   WBCUA None seen 07/25/2020   LABEPIT None seen 07/25/2020   MUCUS Present 07/25/2020   BACTERIA Few 07/25/2020    Pertinent Imaging: Renal US 12/19/2020: Images reviewed and discussed with the patient  Results for orders placed during the hospital encounter of 09/13/20  Abdomen 1 view (KUB)  Narrative CLINICAL DATA:  Nephrolithiasis  EXAM: ABDOMEN - 1 VIEW  COMPARISON:  08/25/2020  FINDINGS: No definite nephro or urolithiasis identified. Normal abdominal gas pattern. Multiple phleboliths noted within the pelvis.  IMPRESSION: No definite nephro or urolithiasis.   Electronically Signed By: Fidela Salisbury MD On:  09/14/2020 02:35  No results found for this or any previous visit.  No results found for this or any previous visit.  No results found for this or  any previous visit.  Results for orders placed during the hospital encounter of 12/18/20  Ultrasound renal complete  Narrative CLINICAL DATA:  Nephrolithiasis.  EXAM: RENAL / URINARY TRACT ULTRASOUND COMPLETE  COMPARISON:  September 14, 2020.  FINDINGS: Right Kidney:  Renal measurements: 11.9 x 6.0 x 6.3 cm = volume: 239 mL. Probable 12 mm nonobstructive calculus seen in midpole collecting system. Echogenicity within normal limits. No mass or hydronephrosis visualized.  Left Kidney:  Renal measurements: 13.4 x 5.7 x 5.6 cm = volume: 224 mL. Probable nonobstructive 7 mm calculus seen in midpole collecting system. Echogenicity within normal limits. No mass or hydronephrosis visualized.  Bladder:  Appears normal for degree of bladder distention. Bilateral ureteral jets are noted.  Other:  None.  IMPRESSION: Probable bilateral nonobstructive nephrolithiasis. No hydronephrosis or renal obstruction is noted.   Electronically Signed By: Marijo Conception M.D. On: 12/18/2020 13:39  No results found for this or any previous visit.  No results found for this or any previous visit.  Results for orders placed during the hospital encounter of 09/14/20  CT RENAL STONE STUDY  Narrative CLINICAL DATA:  LEFT flank pain, history kidney stones, follow-up LEFT kidney stone post lithotripsy  EXAM: CT ABDOMEN AND PELVIS WITHOUT CONTRAST  TECHNIQUE: Multidetector CT imaging of the abdomen and pelvis was performed following the standard protocol without IV contrast. Sagittal and coronal MPR images reconstructed from axial data set. No oral contrast administered.  COMPARISON:  07/22/2020  FINDINGS: Lower chest: Calcified granulomata at lung bases. Minimal pericardial fluid, nonspecific.  Hepatobiliary: Gallbladder and liver  normal appearance  Pancreas: Normal appearance  Spleen: Normal appearance  Adrenals/Urinary Tract: LEFT adrenal thickening without mass. RIGHT adrenal mass 4.3 x 3.9 cm image 24, containing fat, consistent with myelolipoma unchanged. BILATERAL nonobstructing renal calculi. Minimal per nonspecific perinephric stranding, chronic. No hydronephrosis, hydroureter, or ureteral calcification. Bladder unremarkable. Previously identified LEFT UPJ calculus is no longer seen.  Stomach/Bowel: Normal appendix. Food debris in stomach. Bowel loops unremarkable.  Vascular/Lymphatic: Few pelvic phleboliths. Atherosclerotic calcification aorta and iliac arteries without aneurysm. No adenopathy.  Reproductive: Mild prostatic enlargement  Other: No free air or free fluid. Tiny umbilical and LEFT inguinal hernias containing fat.  Musculoskeletal: No acute osseous findings  IMPRESSION: BILATERAL nonobstructing renal calculi.  Previously identified LEFT UPJ calculus no longer S seen.  Stable RIGHT adrenal myelolipoma.  Tiny umbilical and LEFT inguinal hernias containing fat.  No acute intra-abdominal or intrapelvic abnormalities.  Aortic Atherosclerosis (ICD10-I70.0).   Electronically Signed By: Lavonia Dana M.D. On: 09/14/2020 09:43   Assessment & Plan:    1. Nephrolithiasis -RTC 6 months with KUB  2. Weak urinary stream -Continue flomax 0.16m daily   No follow-ups on file.  PNicolette Bang MD  [facility]

## 2020-12-28 ENCOUNTER — Other Ambulatory Visit: Payer: Self-pay | Admitting: Family Medicine

## 2020-12-28 DIAGNOSIS — Z794 Long term (current) use of insulin: Secondary | ICD-10-CM

## 2020-12-28 DIAGNOSIS — E119 Type 2 diabetes mellitus without complications: Secondary | ICD-10-CM

## 2021-01-18 ENCOUNTER — Other Ambulatory Visit: Payer: Self-pay | Admitting: *Deleted

## 2021-01-18 ENCOUNTER — Other Ambulatory Visit: Payer: Self-pay

## 2021-01-18 DIAGNOSIS — E119 Type 2 diabetes mellitus without complications: Secondary | ICD-10-CM

## 2021-01-18 MED ORDER — TRUE METRIX BLOOD GLUCOSE TEST VI STRP: ORAL_STRIP | 2 refills | Status: DC

## 2021-01-23 ENCOUNTER — Ambulatory Visit (INDEPENDENT_AMBULATORY_CARE_PROVIDER_SITE_OTHER): Payer: Medicare Other | Admitting: Family Medicine

## 2021-01-23 ENCOUNTER — Other Ambulatory Visit: Payer: Self-pay

## 2021-01-23 VITALS — BP 107/70 | HR 104 | Temp 97.5°F | Wt 209.2 lb

## 2021-01-23 DIAGNOSIS — J449 Chronic obstructive pulmonary disease, unspecified: Secondary | ICD-10-CM | POA: Diagnosis not present

## 2021-01-23 DIAGNOSIS — E119 Type 2 diabetes mellitus without complications: Secondary | ICD-10-CM | POA: Diagnosis not present

## 2021-01-23 DIAGNOSIS — N200 Calculus of kidney: Secondary | ICD-10-CM | POA: Diagnosis not present

## 2021-01-23 DIAGNOSIS — Z794 Long term (current) use of insulin: Secondary | ICD-10-CM

## 2021-01-23 DIAGNOSIS — Z0100 Encounter for examination of eyes and vision without abnormal findings: Secondary | ICD-10-CM

## 2021-01-23 DIAGNOSIS — N183 Chronic kidney disease, stage 3 unspecified: Secondary | ICD-10-CM

## 2021-01-23 MED ORDER — BUPROPION HCL ER (SR) 150 MG PO TB12: 150.0000 mg | ORAL_TABLET | Freq: Two times a day (BID) | ORAL | 1 refills | Status: DC

## 2021-01-23 MED ORDER — FLUTICASONE-SALMETEROL 250-50 MCG/ACT IN AEPB: INHALATION_SPRAY | RESPIRATORY_TRACT | 0 refills | Status: DC

## 2021-01-23 MED ORDER — OZEMPIC (1 MG/DOSE) 4 MG/3ML ~~LOC~~ SOPN: 1.0000 mg | PEN_INJECTOR | SUBCUTANEOUS | 1 refills | Status: DC

## 2021-01-23 MED ORDER — INSULIN DETEMIR 100 UNIT/ML ~~LOC~~ SOLN: SUBCUTANEOUS | 5 refills | Status: DC

## 2021-01-23 MED ORDER — FUROSEMIDE 20 MG PO TABS: 20.0000 mg | ORAL_TABLET | Freq: Every day | ORAL | 1 refills | Status: DC

## 2021-01-23 MED ORDER — ATORVASTATIN CALCIUM 20 MG PO TABS
20.0000 mg | ORAL_TABLET | Freq: Every day | ORAL | 1 refills | Status: DC
Start: 2021-01-23 — End: 2021-07-02

## 2021-01-23 MED ORDER — ENALAPRIL MALEATE 20 MG PO TABS: 20.0000 mg | ORAL_TABLET | Freq: Two times a day (BID) | ORAL | 1 refills | Status: DC

## 2021-01-23 MED ORDER — METFORMIN HCL 500 MG PO TABS: ORAL_TABLET | ORAL | 1 refills | Status: DC

## 2021-01-23 MED ORDER — ALBUTEROL SULFATE HFA 108 (90 BASE) MCG/ACT IN AERS: INHALATION_SPRAY | RESPIRATORY_TRACT | 5 refills | Status: DC

## 2021-01-23 MED ORDER — NAPROXEN 500 MG PO TABS: ORAL_TABLET | ORAL | 2 refills | Status: DC

## 2021-01-23 NOTE — Progress Notes (Signed)
Patient ID: Shaun Harris, male    DOB: November 02, 1965, 55 y.o.   MRN: [id number]   Chief Complaint  Patient presents with   Diabetes   Hypertension   Subjective:    HPI Pt following up on DM and HTN. Pt states sugar and blood pressure have been ok. Pt states he always has a headache. Checking sugars 4-5 times per day. Pt does not see foot or eye doctor.   Dm2- Compliant with medications. Checking blood glucose.   Not seeing any high or low numbers.  Denies polyuria or polydipsia.  Eye exam: overdue Foot exam: no new concerns.  Glucose- Running around 200s in am and pm. Ozempic and was seeing weight loss clinic at [facility].  Working on losing weight. Needing to work on losing weight to get weight down.  And still losing weight.  Was on 90 units in am and pm on levimir.  Not only taking 30 units levemir am and pm now.  H/o BPH- Taking flomax but not passed a stone.   Chronic headaches and h/o chronic pain- tried many meds in past from when had accident.  Now on amiovig, not sure if helping. Taking tylenol some times.  On chronic pain meds.  Medical History Shaun Harris has a past medical history of Anxiety, Arthritis, Cerebral vasculitis, Chronic headaches, COPD (chronic obstructive pulmonary disease) ([place]), COPD (chronic obstructive pulmonary disease) ([place]), Diabetes mellitus, Hyperlipidemia, Hypertension, Hypertriglyceridemia, Sleep apnea, Stroke ([place]), Tobacco abuse, Ulcerative colitis ([place]), Ulcerative colitis ([place]), and Venous stasis.   Outpatient Encounter Medications as of 01/23/2021  Medication Sig   acetaminophen (TYLENOL) 500 MG tablet Take 1,000 mg by mouth every 6 (six) hours as needed for moderate pain.   AIMOVIG 70 MG/ML SOAJ Inject 70 mg into the skin every 30 (thirty) days.   Alcohol Swabs (PHARMACIST CHOICE ALCOHOL) PADS Reported on 01/23/2016   ALPRAZolam (XANAX) 0.5 MG tablet Take 0.5 mg by mouth 2 (two) times daily.   aspirin EC 325 MG tablet Take 325 mg by  mouth at bedtime.   Blood Glucose Monitoring Suppl w/Device KIT Test glucose 4 times daily. E11.9   glucose blood (TRUE METRIX BLOOD GLUCOSE TEST) test strip Use to check blood sugars 4 times daily   hydrocortisone (ANUSOL-HC) 2.5 % rectal cream USE 1 APPLICATORFUL RECTALLY TWICE DAILY   hydrOXYzine (ATARAX/VISTARIL) 50 MG tablet Take 50 mg by mouth daily as needed for anxiety.   hyoscyamine (LEVSIN SL) 0.125 MG SL tablet TAKE (1) TABLET UNDER TONGUE EVERY FOUR HOURS AS NEEDED FOR CRAMPING. (Patient taking differently: Take 0.125 mg by mouth every 4 (four) hours as needed for cramping.)   insulin aspart (NOVOLOG) 100 UNIT/ML injection INJECT S.Q. 4 TO 12 UNITS FOUR TIMES DAILY PER SLIDING SCALE.   levETIRAcetam (KEPPRA) 500 MG tablet TAKE 3 TABLETS BY MOUTH TWICE DAILY   Mesalamine (ASACOL) 400 MG CPDR DR capsule TAKE (1) CAPSULE BY MOUTH FOUR TIMES DAILY   morphine (MS CONTIN) 30 MG 12 hr tablet Take 1 tablet (30 mg total) by mouth 2 (two) times daily. May fill 60 days after 09/30/2017   Multiple Vitamins-Minerals (CENTRUM PO) Take 1 tablet by mouth daily.   niacin (NIASPAN) 1000 MG CR tablet TAKE (2) TABLETS BY MOUTH ONCE DAILY.   Oxycodone HCl 10 MG TABS Take 1 tablet by mouth 4 times a day as needed for pain. Do not drive or operate machinery while on this medicine. (Patient taking differently: Take 10 mg by mouth 4 (four) times  daily as needed (pain). Do not drive or operate machinery while on this medicine.)   OZEMPIC, 1 MG/DOSE, 4 MG/3ML SOPN Inject 1 mg as directed every Wednesday.   promethazine (PHENERGAN) 25 MG tablet Take 0.5 tablets (12.5 mg total) by mouth every 6 (six) hours as needed for nausea or vomiting. (Patient taking differently: Take 25 mg by mouth every 6 (six) hours as needed for nausea or vomiting.)   tamsulosin (FLOMAX) 0.4 MG CAPS capsule Take 1 capsule (0.4 mg total) by mouth daily after supper.   terazosin (HYTRIN) 10 MG capsule TAKE (1) CAPSULE BY MOUTH ONCE DAILY.    TRUE METRIX BLOOD GLUCOSE TEST test strip CHECK BLOOD SUGAR 4 TIMES DAILY.   TRUEPLUS INSULIN SYRINGE 31G X 5/16" 1 ML MISC USE AS DIRECTED   vitamin C (ASCORBIC ACID) 500 MG tablet Take 500 mg by mouth daily.   [DISCONTINUED] ADVAIR DISKUS 250-50 MCG/ACT AEPB USE 1 INHALATION BY MOUTH EVERY 12 HOURS. <<RINSE MOUTH AFTER USE>>.   [DISCONTINUED] albuterol (VENTOLIN HFA) 108 (90 Base) MCG/ACT inhaler INHALE 2 PUFFS BY MOUTH EVERY 6 HOURS AS NEEDED FOR SHORTNESS OF BREATH.   [DISCONTINUED] atorvastatin (LIPITOR) 20 MG tablet TAKE 1 TABLET BY MOUTH AT BEDTIME FOR CHOLESTEROL   [DISCONTINUED] buPROPion (WELLBUTRIN SR) 150 MG 12 hr tablet TAKE 1 TABLET BY MOUTH TWICE DAILY   [DISCONTINUED] enalapril (VASOTEC) 20 MG tablet TAKE (2) TABLETS BY MOUTH ONCE DAILY.   [DISCONTINUED] furosemide (LASIX) 20 MG tablet TAKE 1 TABLET BY MOUTH ONCE DAILY.   [DISCONTINUED] insulin detemir (LEVEMIR) 100 UNIT/ML injection INJECT 40 UNITS SUBCUTANEOUSLY TWICE DAILY. (Patient taking differently: Inject 20-30 Units into the skin 2 (two) times daily.)   [DISCONTINUED] metFORMIN (GLUCOPHAGE) 500 MG tablet TAKE 2 TABLETS BY MOUTH IN THE MORNING AND 1 IN THE EVENING.   [DISCONTINUED] naproxen (NAPROSYN) 500 MG tablet TAKE (1) TABLET TWICE A DAY WITH FOOD---BREAKFAST AND SUPPER.   [DISCONTINUED] NOVOLOG 100 UNIT/ML injection INJECT S.Q. 4 TO 12 UNITS FOUR TIMES DAILY PER SLIDING SCALE.   [DISCONTINUED] OZEMPIC, 1 MG/DOSE, 4 MG/3ML SOPN Inject 1 mg as directed every Wednesday.   [DISCONTINUED] tiZANidine (ZANAFLEX) 4 MG tablet TAKE 1 TABLET THREE TIMES DAILY AS NEEDED FOR MUSCLE SPASM   albuterol (VENTOLIN HFA) 108 (90 Base) MCG/ACT inhaler INHALE 2 PUFFS BY MOUTH EVERY 6 HOURS AS NEEDED FOR SHORTNESS OF BREATH.   atorvastatin (LIPITOR) 20 MG tablet Take 1 tablet (20 mg total) by mouth daily.   buPROPion (WELLBUTRIN SR) 150 MG 12 hr tablet Take 1 tablet (150 mg total) by mouth 2 (two) times daily.   enalapril (VASOTEC) 20 MG  tablet Take 1 tablet (20 mg total) by mouth 2 (two) times daily.   fluticasone-salmeterol (ADVAIR DISKUS) 250-50 MCG/ACT AEPB USE 1 INHALATION BY MOUTH EVERY 12 HOURS. <<RINSE MOUTH AFTER USE>>.   furosemide (LASIX) 20 MG tablet Take 1 tablet (20 mg total) by mouth daily.   insulin detemir (LEVEMIR) 100 UNIT/ML injection INJECT 30 UNITS SUBCUTANEOUSLY TWICE DAILY.   metFORMIN (GLUCOPHAGE) 500 MG tablet TAKE 2 TABLETS BY MOUTH IN THE MORNING AND 1 IN THE EVENING.   naproxen (NAPROSYN) 500 MG tablet TAKE (1) TABLET TWICE A DAY WITH FOOD---BREAKFAST AND SUPPER.   No facility-administered encounter medications on file as of 01/23/2021.     Review of Systems  Constitutional:  Negative for chills and fever.  HENT:  Negative for congestion, rhinorrhea and sore throat.   Respiratory:  Negative for cough, shortness of breath and wheezing.  Cardiovascular:  Negative for chest pain and leg swelling.  Gastrointestinal:  Negative for abdominal pain, diarrhea, nausea and vomiting.  Genitourinary:  Negative for dysuria and frequency.  Skin:  Negative for rash.  Neurological:  Positive for headaches (chronic). Negative for dizziness and weakness.    Vitals BP 107/70   Pulse (!) 104   Temp (!) 97.5 F (36.4 C)   Wt 209 lb 3.2 oz (94.9 kg)   SpO2 99%   BMI 32.77 kg/m   Objective:   Physical Exam Vitals and nursing note reviewed.  Constitutional:      General: He is not in acute distress.    Appearance: Normal appearance. He is not ill-appearing.  HENT:     Head: Normocephalic.  Eyes:     Extraocular Movements: Extraocular movements intact.     Conjunctiva/sclera: Conjunctivae normal.     Pupils: Pupils are equal, round, and reactive to light.  Cardiovascular:     Rate and Rhythm: Normal rate and regular rhythm.     Pulses: Normal pulses.     Heart sounds: Normal heart sounds. No murmur heard. Pulmonary:     Effort: Pulmonary effort is normal.     Breath sounds: Normal breath sounds.  No wheezing, rhonchi or rales.  Musculoskeletal:        General: Normal range of motion.     Right lower leg: No edema.     Left lower leg: No edema.  Skin:    General: Skin is warm and dry.     Findings: No rash.  Neurological:     General: No focal deficit present.     Mental Status: He is alert and oriented to person, place, and time.     Cranial Nerves: No cranial nerve deficit.  Psychiatric:        Mood and Affect: Mood normal.        Behavior: Behavior normal.        Thought Content: Thought content normal.        Judgment: Judgment normal.     Assessment and Plan   1. Type 2 diabetes mellitus without complication, with long-term current use of insulin (HCC) - OZEMPIC, 1 MG/DOSE, 4 MG/3ML SOPN; Inject 1 mg as directed every Wednesday.  Dispense: 9 mL; Refill: 1 - CMP14+EGFR - Hemoglobin A1c - CBC - insulin detemir (LEVEMIR) 100 UNIT/ML injection; INJECT 30 UNITS SUBCUTANEOUSLY TWICE DAILY.  Dispense: 60 mL; Refill: 5 - metFORMIN (GLUCOPHAGE) 500 MG tablet; TAKE 2 TABLETS BY MOUTH IN THE MORNING AND 1 IN THE EVENING.  Dispense: 270 tablet; Refill: 1 - atorvastatin (LIPITOR) 20 MG tablet; Take 1 tablet (20 mg total) by mouth daily.  Dispense: 90 tablet; Refill: 1  2. Nephrolithiasis - CMP14+EGFR - CBC  3. Morbid obesity ([place]) - Hemoglobin A1c - Lipid panel - CBC  4. Diabetic eye exam [facility]) - Ambulatory referral to Ophthalmology  5. Chronic obstructive pulmonary disease, unspecified COPD type (HCC) - fluticasone-salmeterol (ADVAIR DISKUS) 250-50 MCG/ACT AEPB; USE 1 INHALATION BY MOUTH EVERY 12 HOURS. <<RINSE MOUTH AFTER USE>>.  Dispense: 180 each; Refill: 0 - albuterol (VENTOLIN HFA) 108 (90 Base) MCG/ACT inhaler; INHALE 2 PUFFS BY MOUTH EVERY 6 HOURS AS NEEDED FOR SHORTNESS OF BREATH.  Dispense: 18 g; Refill: 5  6. Stage 3 chronic kidney disease, unspecified whether stage 3a or 3b CKD ([place])   Pt given labs today to get.  Will call with results.  Dm2- cont meds.  Recheck labs. Last a1c 7.5.  Ordered eye  exam. Pt restarted today on ozempic. Pt to cont to check bg daily and call if seeing numbers under 70 or over 200s.   Copd- stable. Cont meds.  Nephrolithiasis- stable. Cont f/u with urology.  Chronic pain/headaches- cont f/u with pain clinic for medications.   Ckd- will recheck labs.  Return in about 4 months (around 05/26/2021) for f/u dm2, copd, ckd.

## 2021-01-24 LAB — CBC
Hematocrit: 37.6 % (ref 37.5–51.0)
Hemoglobin: 12.6 g/dL — ABNORMAL LOW (ref 13.0–17.7)
MCH: 31 pg (ref 26.6–33.0)
MCHC: 33.5 g/dL (ref 31.5–35.7)
MCV: 92 fL (ref 79–97)
Platelets: 215 10*3/uL (ref 150–450)
RBC: 4.07 x10E6/uL — ABNORMAL LOW (ref 4.14–5.80)
RDW: 12 % (ref 11.6–15.4)
WBC: 7.7 10*3/uL (ref 3.4–10.8)

## 2021-01-24 LAB — CMP14+EGFR
ALT: 27 IU/L (ref 0–44)
AST: 22 IU/L (ref 0–40)
Albumin/Globulin Ratio: 2.9 — ABNORMAL HIGH (ref 1.2–2.2)
Albumin: 5 g/dL — ABNORMAL HIGH (ref 3.8–4.9)
Alkaline Phosphatase: 97 IU/L (ref 44–121)
BUN/Creatinine Ratio: 26 — ABNORMAL HIGH (ref 9–20)
BUN: 38 mg/dL — ABNORMAL HIGH (ref 6–24)
Bilirubin Total: <0.2 mg/dL (ref 0.0–1.2)
CO2: 18 mmol/L — ABNORMAL LOW (ref 20–29)
Calcium: 9.3 mg/dL (ref 8.7–10.2)
Chloride: 107 mmol/L — ABNORMAL HIGH (ref 96–106)
Creatinine, Ser: 1.49 mg/dL — ABNORMAL HIGH (ref 0.76–1.27)
Globulin, Total: 1.7 g/dL (ref 1.5–4.5)
Glucose: 149 mg/dL — ABNORMAL HIGH (ref 65–99)
Potassium: 5.3 mmol/L — ABNORMAL HIGH (ref 3.5–5.2)
Sodium: 141 mmol/L (ref 134–144)
Total Protein: 6.7 g/dL (ref 6.0–8.5)
eGFR: 55 mL/min/{1.73_m2} — ABNORMAL LOW (ref >59)

## 2021-01-24 LAB — LIPID PANEL
Chol/HDL Ratio: 2.2 ratio (ref 0.0–5.0)
Cholesterol, Total: 111 mg/dL (ref 100–199)
HDL: 51 mg/dL (ref >39)
LDL Chol Calc (NIH): 44 mg/dL (ref 0–99)
Triglycerides: 83 mg/dL (ref 0–149)
VLDL Cholesterol Cal: 16 mg/dL (ref 5–40)

## 2021-01-24 LAB — HEMOGLOBIN A1C
Est. average glucose Bld gHb Est-mCnc: 169 mg/dL
Hgb A1c MFr Bld: 7.5 % — ABNORMAL HIGH (ref 4.8–5.6)

## 2021-01-26 ENCOUNTER — Other Ambulatory Visit: Payer: Self-pay | Admitting: Family Medicine

## 2021-01-26 DIAGNOSIS — Z79899 Other long term (current) drug therapy: Secondary | ICD-10-CM

## 2021-01-27 ENCOUNTER — Other Ambulatory Visit: Payer: Self-pay | Admitting: Family Medicine

## 2021-01-29 NOTE — Telephone Encounter (Signed)
Pt needing to decrease the tizanidine and just take as needed, since already on several meds that cause sedation. If needing this medication 3x per day would recommend getting this from the pain management doctors he is seeing.   Thx.   Dr. Lovena Le

## 2021-01-30 NOTE — Telephone Encounter (Signed)
Left message to return call 

## 2021-01-30 NOTE — Telephone Encounter (Signed)
Pt returned call and verbalized understanding  

## 2021-02-03 LAB — BASIC METABOLIC PANEL
BUN/Creatinine Ratio: 17 (ref 9–20)
BUN: 36 mg/dL — ABNORMAL HIGH (ref 6–24)
CO2: 18 mmol/L — ABNORMAL LOW (ref 20–29)
Calcium: 8.7 mg/dL (ref 8.7–10.2)
Chloride: 104 mmol/L (ref 96–106)
Creatinine, Ser: 2.11 mg/dL — ABNORMAL HIGH (ref 0.76–1.27)
Glucose: 176 mg/dL — ABNORMAL HIGH (ref 65–99)
Potassium: 5.5 mmol/L — ABNORMAL HIGH (ref 3.5–5.2)
Sodium: 139 mmol/L (ref 134–144)
eGFR: 36 mL/min/{1.73_m2} — ABNORMAL LOW (ref >59)

## 2021-02-06 ENCOUNTER — Other Ambulatory Visit: Payer: Self-pay | Admitting: Family Medicine

## 2021-02-06 ENCOUNTER — Telehealth: Payer: Self-pay | Admitting: Family Medicine

## 2021-02-06 DIAGNOSIS — N183 Chronic kidney disease, stage 3 unspecified: Secondary | ICD-10-CM | POA: Insufficient documentation

## 2021-02-06 DIAGNOSIS — R7989 Other specified abnormal findings of blood chemistry: Secondary | ICD-10-CM

## 2021-02-06 NOTE — Telephone Encounter (Signed)
Left message to return call 

## 2021-02-06 NOTE — Telephone Encounter (Signed)
Pt returned call and verbalized understanding  

## 2021-02-12 ENCOUNTER — Telehealth: Payer: Self-pay | Admitting: Family Medicine

## 2021-02-12 NOTE — Telephone Encounter (Signed)
Pt states if he does not take Ozempic his sugars are really high. Pt doing Ozempic once a week on Wednesday. The last few days he has not had a number under 240. Please advise. Thank you

## 2021-02-12 NOTE — Telephone Encounter (Signed)
Lab results last week: On labs, elevated glucose at 176, that day.  Also creatinine elevated at 2.11, higher than last time at 1.49. And the filtration of kidneys has gone down to 36, normal is over 60. Increase in potassium at 5.5.   Would recommend seeing the kidney doctors to see why the Cr is elevating moresince there's no obstructing kidney stone. Nurse- pls give referral.   Also have pt stop metformin since this can increase kidney functions.  Have pt start on Januvia (will send to pharmacy to start 1 tab daily) Cont all other meds.   Have pt follow up in 1 wk to recheck labs. And drink plenty of water prior to labs being drawn fasting.  (At least 20 oz)  (nurse - order bmp repeat in 1 wkfasting)   Thx.   Dr. Lovena Le

## 2021-02-12 NOTE — Telephone Encounter (Signed)
Pt called into office. Pt Shaun Harris has not been sent to [facility] at this time (per lab results last week). Also pt has been seeing Dr. Alyson Ingles nephrology and goes back to see him in December. Please advise. Thank you

## 2021-02-13 NOTE — Telephone Encounter (Signed)
Left message to return call 

## 2021-02-14 NOTE — Telephone Encounter (Signed)
Pt returned call and has set up office visit for next week.

## 2021-02-20 LAB — BASIC METABOLIC PANEL
BUN/Creatinine Ratio: 18 (ref 9–20)
BUN: 27 mg/dL — ABNORMAL HIGH (ref 6–24)
CO2: 17 mmol/L — ABNORMAL LOW (ref 20–29)
Calcium: 9.2 mg/dL (ref 8.7–10.2)
Chloride: 110 mmol/L — ABNORMAL HIGH (ref 96–106)
Creatinine, Ser: 1.49 mg/dL — ABNORMAL HIGH (ref 0.76–1.27)
Glucose: 186 mg/dL — ABNORMAL HIGH (ref 65–99)
Potassium: 4.6 mmol/L (ref 3.5–5.2)
Sodium: 142 mmol/L (ref 134–144)
eGFR: 55 mL/min/{1.73_m2} — ABNORMAL LOW (ref >59)

## 2021-02-22 ENCOUNTER — Other Ambulatory Visit: Payer: Self-pay

## 2021-02-22 ENCOUNTER — Ambulatory Visit (INDEPENDENT_AMBULATORY_CARE_PROVIDER_SITE_OTHER): Payer: Medicare Other | Admitting: Family Medicine

## 2021-02-22 VITALS — BP 108/73 | HR 104 | Temp 98.2°F | Ht 67.0 in | Wt 205.0 lb

## 2021-02-22 DIAGNOSIS — E1165 Type 2 diabetes mellitus with hyperglycemia: Secondary | ICD-10-CM | POA: Diagnosis not present

## 2021-02-22 DIAGNOSIS — Z87898 Personal history of other specified conditions: Secondary | ICD-10-CM

## 2021-02-22 DIAGNOSIS — K5120 Ulcerative (chronic) proctitis without complications: Secondary | ICD-10-CM

## 2021-02-22 DIAGNOSIS — N400 Enlarged prostate without lower urinary tract symptoms: Secondary | ICD-10-CM

## 2021-02-22 DIAGNOSIS — Z794 Long term (current) use of insulin: Secondary | ICD-10-CM

## 2021-02-22 DIAGNOSIS — N183 Chronic kidney disease, stage 3 unspecified: Secondary | ICD-10-CM

## 2021-02-22 DIAGNOSIS — N200 Calculus of kidney: Secondary | ICD-10-CM | POA: Diagnosis not present

## 2021-02-22 MED ORDER — "INSULIN SYRINGE-NEEDLE U-100 31G X 5/16"" 1 ML MISC": 3 refills | Status: DC

## 2021-02-22 MED ORDER — LEVETIRACETAM 500 MG PO TABS: 1500.0000 mg | ORAL_TABLET | Freq: Two times a day (BID) | ORAL | 0 refills | Status: DC

## 2021-02-22 MED ORDER — INSULIN ASPART 100 UNIT/ML IJ SOLN: INTRAMUSCULAR | 5 refills | Status: DC

## 2021-02-22 NOTE — Progress Notes (Signed)
Patient ID: Shaun Harris, male    DOB: 10/02/1965, 55 y.o.   MRN: [id number]   Chief Complaint  Patient presents with   Diabetes   Subjective:    HPI Dm2- follow up- Seeing some 500s glucose. Taking 30units in am 30 in pm levimir. Pt on ozempic and glipizide.  This am was 245 glucose this am. Taking novolog sliding scale- taking 10-12 per meal also.  H/o constipation- multifactorial, on chronic opiates. Taking stool softer and miralax prn. When taking pain meds gets constipation.  Since inc in creatinine- Had kidney stone earlier this year. Cr at 1.49 Was at 2.11 Some times not getting good flow with urination.    Seeing urology in dec. Taking flomax and terazosin and trying to help with urination stream.  Pt has h/o ulcerative colitis and taking mesalamine.  Not seeing GI for this any longer.  Medical History Shaun Harris has a past medical history of Anxiety, Arthritis, Cerebral vasculitis, Chronic headaches, COPD (chronic obstructive pulmonary disease) ([place]), COPD (chronic obstructive pulmonary disease) ([place]), Diabetes mellitus, Hyperlipidemia, Hypertension, Hypertriglyceridemia, Sleep apnea, Stroke ([place]), Tobacco abuse, Ulcerative colitis ([place]), Ulcerative colitis ([place]), and Venous stasis.   Outpatient Encounter Medications as of 02/22/2021  Medication Sig   AIMOVIG 70 MG/ML SOAJ Inject 70 mg into the skin every 30 (thirty) days.   albuterol (VENTOLIN HFA) 108 (90 Base) MCG/ACT inhaler INHALE 2 PUFFS BY MOUTH EVERY 6 HOURS AS NEEDED FOR SHORTNESS OF BREATH.   ALPRAZolam (XANAX) 0.5 MG tablet Take 0.5 mg by mouth 2 (two) times daily.   aspirin EC 325 MG tablet Take 325 mg by mouth at bedtime.   atorvastatin (LIPITOR) 20 MG tablet Take 1 tablet (20 mg total) by mouth daily.   buPROPion (WELLBUTRIN SR) 150 MG 12 hr tablet Take 1 tablet (150 mg total) by mouth 2 (two) times daily.   enalapril (VASOTEC) 20 MG tablet Take 1 tablet (20 mg total) by mouth 2 (two) times daily.    fluticasone-salmeterol (ADVAIR DISKUS) 250-50 MCG/ACT AEPB USE 1 INHALATION BY MOUTH EVERY 12 HOURS. <<RINSE MOUTH AFTER USE>>.   furosemide (LASIX) 20 MG tablet Take 1 tablet (20 mg total) by mouth daily.   glucose blood (TRUE METRIX BLOOD GLUCOSE TEST) test strip Use to check blood sugars 4 times daily   hydrOXYzine (ATARAX/VISTARIL) 50 MG tablet Take 50 mg by mouth daily as needed for anxiety.   insulin aspart (NOVOLOG) 100 UNIT/ML injection Inject SQ 4-12 units 4x per day per sliding scale.   insulin detemir (LEVEMIR) 100 UNIT/ML injection INJECT 30 UNITS SUBCUTANEOUSLY TWICE DAILY.   morphine (MS CONTIN) 30 MG 12 hr tablet Take 1 tablet (30 mg total) by mouth 2 (two) times daily. May fill 60 days after 09/30/2017   Multiple Vitamins-Minerals (CENTRUM PO) Take 1 tablet by mouth daily.   niacin (NIASPAN) 1000 MG CR tablet TAKE (2) TABLETS BY MOUTH ONCE DAILY.   Oxycodone HCl 10 MG TABS Take 1 tablet by mouth 4 times a day as needed for pain. Do not drive or operate machinery while on this medicine. (Patient taking differently: Take 10 mg by mouth 4 (four) times daily as needed (pain). Do not drive or operate machinery while on this medicine.)   OZEMPIC, 1 MG/DOSE, 4 MG/3ML SOPN Inject 1 mg as directed every Wednesday.   promethazine (PHENERGAN) 25 MG tablet Take 0.5 tablets (12.5 mg total) by mouth every 6 (six) hours as needed for nausea or vomiting. (Patient taking differently: Take 25 mg by  mouth every 6 (six) hours as needed for nausea or vomiting.)   tamsulosin (FLOMAX) 0.4 MG CAPS capsule Take 1 capsule (0.4 mg total) by mouth daily after supper.   tiZANidine (ZANAFLEX) 4 MG tablet TAKE 1 TABLET THREE TIMES DAILY AS NEEDED FOR MUSCLE SPASM   TRUE METRIX BLOOD GLUCOSE TEST test strip CHECK BLOOD SUGAR 4 TIMES DAILY.   vitamin C (ASCORBIC ACID) 500 MG tablet Take 500 mg by mouth daily.   [DISCONTINUED] hyoscyamine (LEVSIN SL) 0.125 MG SL tablet TAKE (1) TABLET UNDER TONGUE EVERY FOUR HOURS AS  NEEDED FOR CRAMPING.   [DISCONTINUED] insulin aspart (NOVOLOG) 100 UNIT/ML injection INJECT S.Q. 4 TO 12 UNITS FOUR TIMES DAILY PER SLIDING SCALE.   [DISCONTINUED] levETIRAcetam (KEPPRA) 500 MG tablet TAKE 3 TABLETS BY MOUTH TWICE DAILY   [DISCONTINUED] Mesalamine (ASACOL) 400 MG CPDR DR capsule TAKE (1) CAPSULE BY MOUTH FOUR TIMES DAILY   [DISCONTINUED] terazosin (HYTRIN) 10 MG capsule TAKE (1) CAPSULE BY MOUTH ONCE DAILY.   [DISCONTINUED] TRUEPLUS INSULIN SYRINGE 31G X 5/16" 1 ML MISC USE AS DIRECTED   acetaminophen (TYLENOL) 500 MG tablet Take 1,000 mg by mouth every 6 (six) hours as needed for moderate pain.   Alcohol Swabs (PHARMACIST CHOICE ALCOHOL) PADS Reported on 01/23/2016   Blood Glucose Monitoring Suppl w/Device KIT Test glucose 4 times daily. E11.9   hydrocortisone (ANUSOL-HC) 2.5 % rectal cream USE 1 APPLICATORFUL RECTALLY TWICE DAILY (Patient not taking: Reported on 02/22/2021)   Insulin Syringe-Needle U-100 (TRUEPLUS INSULIN SYRINGE) 31G X 5/16" 1 ML MISC USE AS DIRECTED   levETIRAcetam (KEPPRA) 500 MG tablet Take 3 tablets (1,500 mg total) by mouth 2 (two) times daily.   Mesalamine (ASACOL) 400 MG CPDR DR capsule Take 1 tab p.o. qid.   terazosin (HYTRIN) 10 MG capsule Take 1 tab p.o. daily.   [DISCONTINUED] metFORMIN (GLUCOPHAGE) 500 MG tablet TAKE 2 TABLETS BY MOUTH IN THE MORNING AND 1 IN THE EVENING. (Patient not taking: Reported on 02/22/2021)   [DISCONTINUED] naproxen (NAPROSYN) 500 MG tablet TAKE (1) TABLET TWICE A DAY WITH FOOD---BREAKFAST AND SUPPER. (Patient not taking: Reported on 02/22/2021)   [DISCONTINUED] NOVOLOG 100 UNIT/ML injection INJECT S.Q. 4 TO 12 UNITS FOUR TIMES DAILY PER SLIDING SCALE.   No facility-administered encounter medications on file as of 02/22/2021.     Review of Systems  Constitutional:  Negative for chills and fever.  HENT:  Negative for congestion, rhinorrhea and sore throat.   Respiratory:  Negative for cough, shortness of breath and  wheezing.   Cardiovascular:  Negative for chest pain and leg swelling.  Gastrointestinal:  Positive for constipation. Negative for abdominal pain, diarrhea, nausea and vomiting.  Genitourinary:  Negative for dysuria and frequency.  Skin:  Negative for rash.  Neurological:  Negative for dizziness, weakness and headaches.    Vitals BP 108/73   Pulse (!) 104   Temp 98.2 F (36.8 C)   Ht _0  (1.702 m)   Wt 205 lb (93 kg)   SpO2 97%   BMI 32.11 kg/m   Objective:   Physical Exam Vitals and nursing note reviewed.  Constitutional:      General: He is not in acute distress.    Appearance: Normal appearance. He is not ill-appearing.  HENT:     Head: Normocephalic.     Nose: Nose normal. No congestion.     Mouth/Throat:     Mouth: Mucous membranes are moist.     Pharynx: No oropharyngeal exudate.  Eyes:  Extraocular Movements: Extraocular movements intact.     Conjunctiva/sclera: Conjunctivae normal.     Pupils: Pupils are equal, round, and reactive to light.  Cardiovascular:     Rate and Rhythm: Normal rate and regular rhythm.     Pulses: Normal pulses.     Heart sounds: Normal heart sounds. No murmur heard. Pulmonary:     Effort: Pulmonary effort is normal.     Breath sounds: Normal breath sounds. No wheezing, rhonchi or rales.  Musculoskeletal:        General: Normal range of motion.     Right lower leg: No edema.     Left lower leg: No edema.  Skin:    General: Skin is warm and dry.     Findings: No rash.  Neurological:     General: No focal deficit present.     Mental Status: He is alert and oriented to person, place, and time.     Cranial Nerves: No cranial nerve deficit.  Psychiatric:        Mood and Affect: Mood normal.        Behavior: Behavior normal.        Thought Content: Thought content normal.        Judgment: Judgment normal.     Assessment and Plan   1. Type 2 diabetes mellitus with hyperglycemia, with long-term current use of insulin  (HCC) - insulin aspart (NOVOLOG) 100 UNIT/ML injection; Inject SQ 4-12 units 4x per day per sliding scale.  Dispense: 10 mL; Refill: 5 - Insulin Syringe-Needle U-100 (TRUEPLUS INSULIN SYRINGE) 31G X 5/16" 1 ML MISC; USE AS DIRECTED  Dispense: 100 each; Refill: 3  2. Stage 3 chronic kidney disease, unspecified whether stage 3a or 3b CKD ([place])  3. Bilateral nephrolithiasis  4. History of seizure  5. Ulcerative proctitis without complication ([place])  6. Benign prostatic hyperplasia, unspecified whether lower urinary tract symptoms present   Dm2- uncontrolled.   Increase from 30 units bid  to 40 units bid for levimir. Cont all other diabetic meds.  Stopped the metformin due to elevated Cr. Pt still taking novolog tid with meals, per sliding scale at times 10-12 units.  Ckd- stable. Cont monitor.  Call urology to see about increase the flomax. To 0.60m if still having symptoms. Pt supposed to f/u with urology to recheck non obst kidney stones.   Return in about 3 months (around 05/25/2021) for f/u dm2, htn, copd.

## 2021-03-14 DIAGNOSIS — Z87898 Personal history of other specified conditions: Secondary | ICD-10-CM | POA: Insufficient documentation

## 2021-03-14 MED ORDER — MESALAMINE 400 MG PO CPDR: DELAYED_RELEASE_CAPSULE | ORAL | 2 refills | Status: DC

## 2021-03-14 MED ORDER — TERAZOSIN HCL 10 MG PO CAPS: ORAL_CAPSULE | ORAL | 0 refills | Status: DC

## 2021-03-30 ENCOUNTER — Other Ambulatory Visit: Payer: Self-pay | Admitting: Family Medicine

## 2021-04-17 ENCOUNTER — Other Ambulatory Visit: Payer: Self-pay | Admitting: Family Medicine

## 2021-04-17 DIAGNOSIS — Z794 Long term (current) use of insulin: Secondary | ICD-10-CM

## 2021-04-17 DIAGNOSIS — E119 Type 2 diabetes mellitus without complications: Secondary | ICD-10-CM

## 2021-05-17 ENCOUNTER — Other Ambulatory Visit: Payer: Self-pay | Admitting: Family Medicine

## 2021-05-17 DIAGNOSIS — E1165 Type 2 diabetes mellitus with hyperglycemia: Secondary | ICD-10-CM

## 2021-05-30 ENCOUNTER — Other Ambulatory Visit: Payer: Self-pay | Admitting: Family Medicine

## 2021-05-30 DIAGNOSIS — E119 Type 2 diabetes mellitus without complications: Secondary | ICD-10-CM

## 2021-05-30 DIAGNOSIS — Z794 Long term (current) use of insulin: Secondary | ICD-10-CM

## 2021-05-30 DIAGNOSIS — J449 Chronic obstructive pulmonary disease, unspecified: Secondary | ICD-10-CM

## 2021-05-30 DIAGNOSIS — E1165 Type 2 diabetes mellitus with hyperglycemia: Secondary | ICD-10-CM

## 2021-06-25 ENCOUNTER — Ambulatory Visit (INDEPENDENT_AMBULATORY_CARE_PROVIDER_SITE_OTHER): Payer: Medicare Other | Admitting: Urology

## 2021-06-25 ENCOUNTER — Other Ambulatory Visit: Payer: Self-pay

## 2021-06-25 ENCOUNTER — Encounter: Payer: Self-pay | Admitting: Urology

## 2021-06-25 ENCOUNTER — Ambulatory Visit (HOSPITAL_COMMUNITY)
Admission: RE | Admit: 2021-06-25 | Discharge: 2021-06-25 | Disposition: A | Discharge status: Home / Self Care | Payer: Medicare Other | Source: Ambulatory Visit | Attending: Urology | Admitting: Urology

## 2021-06-25 VITALS — BP 90/56 | HR 120

## 2021-06-25 DIAGNOSIS — N200 Calculus of kidney: Secondary | ICD-10-CM | POA: Diagnosis not present

## 2021-06-25 NOTE — Progress Notes (Signed)
Urological Symptom Review  Patient is experiencing the following symptoms: Get up at night to urinate   Review of Systems  Gastrointestinal (upper)  : Negative for upper GI symptoms  Gastrointestinal (lower) : Negative for lower GI symptoms  Constitutional : Negative for symptoms  Skin: Negative for skin symptoms  Eyes: Negative for eye symptoms  Ear/Nose/Throat : Negative for Ear/Nose/Throat symptoms  Hematologic/Lymphatic: Negative for Hematologic/Lymphatic symptoms  Cardiovascular : Negative for cardiovascular symptoms  Respiratory : Negative for respiratory symptoms  Endocrine: Negative for endocrine symptoms  Musculoskeletal: Joint pain  Neurological: Headaches  Psychologic: Depression Anxiety

## 2021-06-25 NOTE — Progress Notes (Signed)
06/25/2021 2:17 PM   Shaun Harris 27-Aug-1965 [id number]  Referring provider: Erven Colla, DO [place],  [place] [address]  Followup nephrolithiasis   HPI: [name] is a 55yo here for followup for nephrolithiasis. He underwent 24 hour urine which showed 1.6L volume, profoundly low citrate. He is currently on sodium bicarbonate and UrocitK 76mq TID. He denies any flank pain. No stone events since last visit.  KUB from today shows a stable right lower pole calculi. Urine pH 5.5.    PMH: Past Medical History:  Diagnosis Date   Anxiety    Arthritis    Cerebral vasculitis    Chronic headaches    constant since possible stroke in 1997,"Vasculitis of brain with increased pressure of brain ".   COPD (chronic obstructive pulmonary disease) (HCC)    COPD (chronic obstructive pulmonary disease) (HCC)    Diabetes mellitus    Hyperlipidemia    Hypertension    Hypertriglyceridemia    Sleep apnea    has but doesnt use, cannot tolerate; PCP aware   Stroke ([facility]    pt states,"they arent sure if i had a stroke in 1997, there is still a question about that with my doctors". No deficits.   Tobacco abuse    Ulcerative colitis ([facility]    Ulcerative colitis ([facility]    Venous stasis     Surgical History: Past Surgical History:  Procedure Laterality Date   BIOPSY  10/25/2016   Procedure: BIOPSY;  Surgeon: NRogene Houston MD;  [facility];  Service: Endoscopy;;  colon   CERVICAL SPINE SURGERY  2008   x2-last surgery 04/24/2016.   COLONOSCOPY WITH PROPOFOL N/A 10/25/2016   Procedure: COLONOSCOPY WITH PROPOFOL;  Surgeon: NRogene Houston MD;  [facility]: [facility];  Service: Endoscopy;  Laterality: N/A;  7:30   EXTRACORPOREAL SHOCK WAVE LITHOTRIPSY Left 08/08/2020   Procedure: EXTRACORPOREAL SHOCK WAVE LITHOTRIPSY (ESWL);  Surgeon: MCleon Gustin MD;  Location: [facility];  Service: Urology;  Laterality: Left;   KNEE ARTHROSCOPY  02/25/2012   Procedure: ARTHROSCOPY  KNEE;  Surgeon: WSanjuana Kava MD;  Location: [facility];  Service: Orthopedics;  Laterality: Right;   KNEE ARTHROSCOPY WITH MEDIAL MENISECTOMY Left 12/23/2014   Procedure: LEFT KNEE ARTHROSCOPY WITH PARTIAL MENISECTOMY;  Surgeon: WSanjuana Kava MD;  Location: [facility];  Service: Orthopedics;  Laterality: Left;   POLYPECTOMY  10/25/2016   Procedure: POLYPECTOMY;  Surgeon: NRogene Houston MD;  [facility];  Service: Endoscopy;;  colon   SHOULDER ARTHROSCOPY  2004   left shoulder   SHOULDER ARTHROSCOPY  2008   right shoulder    Home Medications:  Allergies as of 06/25/2021       Reactions   Gabapentin Other (See Comments)   Increased blood sugar        Medication List        Accurate as of June 25, 2021  2:17 PM. If you have any questions, ask your nurse or doctor.          acetaminophen 500 MG tablet Commonly known as: TYLENOL Take 1,000 mg by mouth every 6 (six) hours as needed for moderate pain.   Advair Diskus 250-50 MCG/ACT Aepb Generic drug: fluticasone-salmeterol USE 1 INHALATION BY MOUTH EVERY 12 HOURS. <<RINSE MOUTH AFTER USE>>.   Aimovig 70 MG/ML Soaj Generic drug: Erenumab-aooe Inject 70 mg into the skin every 30 (thirty) days.   albuterol 108 (90 Base) MCG/ACT inhaler Commonly known as: VENTOLIN HFA  INHALE 2 PUFFS BY MOUTH EVERY 6 HOURS AS NEEDED FOR SHORTNESS OF BREATH.   ALPRAZolam 0.5 MG tablet Commonly known as: XANAX Take 0.5 mg by mouth 2 (two) times daily.   aspirin EC 325 MG tablet Take 325 mg by mouth at bedtime.   atorvastatin 20 MG tablet Commonly known as: LIPITOR Take 1 tablet (20 mg total) by mouth daily.   Blood Glucose Monitoring Suppl w/Device Kit Test glucose 4 times daily. E11.9   buPROPion 150 MG 12 hr tablet Commonly known as: WELLBUTRIN SR Take 1 tablet (150 mg total) by mouth 2 (two) times daily.   CENTRUM PO Take 1 tablet by mouth daily.   enalapril 20 MG tablet Commonly known as: VASOTEC Take 1 tablet  (20 mg total) by mouth 2 (two) times daily.   furosemide 20 MG tablet Commonly known as: LASIX Take 1 tablet (20 mg total) by mouth daily.   hydrocortisone 2.5 % rectal cream Commonly known as: ANUSOL-HC USE 1 APPLICATORFUL RECTALLY TWICE DAILY   hydrOXYzine 50 MG tablet Commonly known as: ATARAX Take 50 mg by mouth daily as needed for anxiety.   insulin detemir 100 UNIT/ML injection Commonly known as: Levemir INJECT 30 UNITS SUBCUTANEOUSLY TWICE DAILY.   levETIRAcetam 500 MG tablet Commonly known as: KEPPRA Take 3 tablets (1,500 mg total) by mouth 2 (two) times daily.   Mesalamine 400 MG Cpdr DR capsule Commonly known as: ASACOL TAKE (1) CAPSULE BY MOUTH FOUR TIMES DAILY   metFORMIN 500 MG tablet Commonly known as: GLUCOPHAGE Take 500 mg by mouth 3 (three) times daily.   morphine 30 MG 12 hr tablet Commonly known as: MS CONTIN Take 1 tablet (30 mg total) by mouth 2 (two) times daily. May fill 60 days after 09/30/2017   naproxen 500 MG tablet Commonly known as: NAPROSYN Take 500 mg by mouth 2 (two) times daily.   niacin 1000 MG CR tablet Commonly known as: NIASPAN TAKE (2) TABLETS BY MOUTH ONCE DAILY.   NovoLOG 100 UNIT/ML injection Generic drug: insulin aspart INJECT S.Q. 4 TO 12 UNITS FOUR TIMES DAILY PER SLIDING SCALE.   Oxycodone HCl 10 MG Tabs Take 1 tablet by mouth 4 times a day as needed for pain. Do not drive or operate machinery while on this medicine. What changed:  how much to take how to take this when to take this reasons to take this additional instructions   Ozempic (1 MG/DOSE) 4 MG/3ML Sopn Generic drug: Semaglutide (1 MG/DOSE) INJECT 1MG AS DIRECTED EVERY WEDNESDAY.   Pharmacist Choice Alcohol Pads Reported on 01/23/2016   Potassium Citrate 15 MEQ (1620 MG) Tbcr Take by mouth.   promethazine 25 MG tablet Commonly known as: PHENERGAN Take 0.5 tablets (12.5 mg total) by mouth every 6 (six) hours as needed for nausea or vomiting. What  changed: how much to take   tamsulosin 0.4 MG Caps capsule Commonly known as: FLOMAX Take 1 capsule (0.4 mg total) by mouth daily after supper.   terazosin 10 MG capsule Commonly known as: HYTRIN Take 1 tab p.o. daily.   tiZANidine 4 MG tablet Commonly known as: ZANAFLEX TAKE 1 TABLET THREE TIMES DAILY AS NEEDED FOR MUSCLE SPASM   True Metrix Blood Glucose Test test strip Generic drug: glucose blood CHECK BLOOD SUGAR 4 TIMES DAILY.   True Metrix Blood Glucose Test test strip Generic drug: glucose blood CHECK BLOOD SUGAR 4 TIMES DAILY.   TRUEplus Insulin Syringe 31G X 5/16" 1 ML Misc Generic drug: Insulin Syringe-Needle U-100  USE AS DIRECTED   venlafaxine XR 37.5 MG 24 hr capsule Commonly known as: EFFEXOR-XR Take 37.5 mg by mouth daily.   vitamin C 500 MG tablet Commonly known as: ASCORBIC ACID Take 500 mg by mouth daily.        Allergies:  Allergies  Allergen Reactions   Gabapentin Other (See Comments)    Increased blood sugar    Family History: Family History  Problem Relation Age of Onset   Hypertension Father    Diabetes Father    Heart disease Father    Heart disease Maternal Grandfather    Heart attack Other    Heart disease Brother        both brothers have heart disease   Hypertension Sister     Social History:  reports that he has been smoking cigarettes. He has a 35.00 pack-year smoking history. He has never used smokeless tobacco. He reports that he does not drink alcohol and does not use drugs.  ROS: All other review of systems were reviewed and are negative except what is noted above in HPI  Physical Exam: BP (!) 90/56   Pulse (!) 120   Constitutional:  Alert and oriented, No acute distress. HEENT: [place] AT, moist mucus membranes.  Trachea midline, no masses. Cardiovascular: No clubbing, cyanosis, or edema. Respiratory: Normal respiratory effort, no increased work of breathing. GI: Abdomen is soft, nontender, nondistended, no abdominal  masses GU: No CVA tenderness.  Lymph: No cervical or inguinal lymphadenopathy. Skin: No rashes, bruises or suspicious lesions. Neurologic: Grossly intact, no focal deficits, moving all 4 extremities. Psychiatric: Normal mood and affect.  Laboratory Data: Lab Results  Component Value Date   WBC 7.7 01/23/2021   HGB 12.6 (L) 01/23/2021   HCT 37.6 01/23/2021   MCV 92 01/23/2021   PLT 215 01/23/2021    Lab Results  Component Value Date   CREATININE 1.49 (H) 02/19/2021    No results found for: PSA  No results found for: TESTOSTERONE  Lab Results  Component Value Date   HGBA1C 7.5 (H) 01/23/2021    Urinalysis    Component Value Date/Time   COLORURINE YELLOW 07/21/2020 2356   APPEARANCEUR Clear 09/20/2020 1606   LABSPEC 1.020 07/21/2020 2356   PHURINE 6.0 07/21/2020 2356   GLUCOSEU Negative 09/20/2020 1606   HGBUR SMALL (A) 07/21/2020 2356   BILIRUBINUR Negative 09/20/2020 1606   KETONESUR >80 (A) 07/21/2020 2356   PROTEINUR Negative 09/20/2020 1606   PROTEINUR NEGATIVE 07/21/2020 2356   UROBILINOGEN 0.2 12/21/2014 0845   NITRITE Negative 09/20/2020 1606   NITRITE NEGATIVE 07/21/2020 2356   LEUKOCYTESUR Negative 09/20/2020 1606   LEUKOCYTESUR NEGATIVE 07/21/2020 2356    Lab Results  Component Value Date   LABMICR 6.5 10/24/2020   WBCUA None seen 07/25/2020   LABEPIT None seen 07/25/2020   MUCUS Present 07/25/2020   BACTERIA Few 07/25/2020    Pertinent Imaging: KUB today: Images reviewed and discussed with the patient Results for orders placed during the hospital encounter of 09/13/20  Abdomen 1 view (KUB)  Narrative CLINICAL DATA:  Nephrolithiasis  EXAM: ABDOMEN - 1 VIEW  COMPARISON:  08/25/2020  FINDINGS: No definite nephro or urolithiasis identified. Normal abdominal gas pattern. Multiple phleboliths noted within the pelvis.  IMPRESSION: No definite nephro or urolithiasis.   Electronically Signed By: Fidela Salisbury MD On: 09/14/2020  02:35  No results found for this or any previous visit.  No results found for this or any previous visit.  No results found for  this or any previous visit.  Results for orders placed during the hospital encounter of 12/18/20  Ultrasound renal complete  Narrative CLINICAL DATA:  Nephrolithiasis.  EXAM: RENAL / URINARY TRACT ULTRASOUND COMPLETE  COMPARISON:  September 14, 2020.  FINDINGS: Right Kidney:  Renal measurements: 11.9 x 6.0 x 6.3 cm = volume: 239 mL. Probable 12 mm nonobstructive calculus seen in midpole collecting system. Echogenicity within normal limits. No mass or hydronephrosis visualized.  Left Kidney:  Renal measurements: 13.4 x 5.7 x 5.6 cm = volume: 224 mL. Probable nonobstructive 7 mm calculus seen in midpole collecting system. Echogenicity within normal limits. No mass or hydronephrosis visualized.  Bladder:  Appears normal for degree of bladder distention. Bilateral ureteral jets are noted.  Other:  None.  IMPRESSION: Probable bilateral nonobstructive nephrolithiasis. No hydronephrosis or renal obstruction is noted.   Electronically Signed By: Marijo Conception M.D. On: 12/18/2020 13:39  No results found for this or any previous visit.  No results found for this or any previous visit.  Results for orders placed during the hospital encounter of 09/14/20  CT RENAL STONE STUDY  Narrative CLINICAL DATA:  LEFT flank pain, history kidney stones, follow-up LEFT kidney stone post lithotripsy  EXAM: CT ABDOMEN AND PELVIS WITHOUT CONTRAST  TECHNIQUE: Multidetector CT imaging of the abdomen and pelvis was performed following the standard protocol without IV contrast. Sagittal and coronal MPR images reconstructed from axial data set. No oral contrast administered.  COMPARISON:  07/22/2020  FINDINGS: Lower chest: Calcified granulomata at lung bases. Minimal pericardial fluid, nonspecific.  Hepatobiliary: Gallbladder and liver normal  appearance  Pancreas: Normal appearance  Spleen: Normal appearance  Adrenals/Urinary Tract: LEFT adrenal thickening without mass. RIGHT adrenal mass 4.3 x 3.9 cm image 24, containing fat, consistent with myelolipoma unchanged. BILATERAL nonobstructing renal calculi. Minimal per nonspecific perinephric stranding, chronic. No hydronephrosis, hydroureter, or ureteral calcification. Bladder unremarkable. Previously identified LEFT UPJ calculus is no longer seen.  Stomach/Bowel: Normal appendix. Food debris in stomach. Bowel loops unremarkable.  Vascular/Lymphatic: Few pelvic phleboliths. Atherosclerotic calcification aorta and iliac arteries without aneurysm. No adenopathy.  Reproductive: Mild prostatic enlargement  Other: No free air or free fluid. Tiny umbilical and LEFT inguinal hernias containing fat.  Musculoskeletal: No acute osseous findings  IMPRESSION: BILATERAL nonobstructing renal calculi.  Previously identified LEFT UPJ calculus no longer S seen.  Stable RIGHT adrenal myelolipoma.  Tiny umbilical and LEFT inguinal hernias containing fat.  No acute intra-abdominal or intrapelvic abnormalities.  Aortic Atherosclerosis (ICD10-I70.0).   Electronically Signed By: Lavonia Dana M.D. On: 09/14/2020 09:43   Assessment & Plan:    1. Nephrolithiasis -repeat 24 hour urine -RTC 6 months with KUB - Urinalysis, Routine w reflex microscopic   No follow-ups on file.  Nicolette Bang, MD  [facility] [place]

## 2021-06-25 NOTE — Patient Instructions (Signed)
Dietary Guidelines to Help Prevent Kidney Stones Kidney stones are deposits of minerals and salts that form inside your kidneys. Your risk of developing kidney stones may be greater depending on your diet, your lifestyle, the medicines you take, and whether you have certain medical conditions. Most people can lower their chances of developing kidney stones by following the instructions below. Your dietitian may give you more specific instructions depending on your overall health and the type of kidney stones you tend to develop. What are tips for following this plan? Reading food labels  Choose foods with "no salt added" or "low-salt" labels. Limit your salt (sodium) intake to less than 1,500 mg a day. Choose foods with calcium for each meal and snack. Try to eat about 300 mg of calcium at each meal. Foods that contain 200-500 mg of calcium a serving include: 8 oz (237 mL) of milk, calcium-fortifiednon-dairy milk, and calcium-fortifiedfruit juice. Calcium-fortified means that calcium has been added to these drinks. 8 oz (237 mL) of kefir, yogurt, and soy yogurt. 4 oz (114 g) of tofu. 1 oz (28 g) of cheese. 1 cup (150 g) of dried figs. 1 cup (91 g) of cooked broccoli. One 3 oz (85 g) can of sardines or mackerel. Most people need 1,000-1,500 mg of calcium a day. Talk to your dietitian about how much calcium is recommended for you. Shopping Buy plenty of fresh fruits and vegetables. Most people do not need to avoid fruits and vegetables, even if these foods contain nutrients that may contribute to kidney stones. When shopping for convenience foods, choose: Whole pieces of fruit. Pre-made salads with dressing on the side. Low-fat fruit and yogurt smoothies. Avoid buying frozen meals or prepared deli foods. These can be high in sodium. Look for foods with live cultures, such as yogurt and kefir. Choose high-fiber grains, such as whole-wheat breads, oat bran, and wheat cereals. Cooking Do not add  salt to food when cooking. Place a salt shaker on the table and allow each person to add his or her own salt to taste. Use vegetable protein, such as beans, textured vegetable protein (TVP), or tofu, instead of meat in pasta, casseroles, and soups. Meal planning Eat less salt, if told by your dietitian. To do this: Avoid eating processed or pre-made food. Avoid eating fast food. Eat less animal protein, including cheese, meat, poultry, or fish, if told by your dietitian. To do this: Limit the number of times you have meat, poultry, fish, or cheese each week. Eat a diet free of meat at least 2 days a week. Eat only one serving each day of meat, poultry, fish, or seafood. When you prepare animal protein, cut pieces into small portion sizes. For most meat and fish, one serving is about the size of the palm of your hand. Eat at least five servings of fresh fruits and vegetables each day. To do this: Keep fruits and vegetables on hand for snacks. Eat one piece of fruit or a handful of berries with breakfast. Have a salad and fruit at lunch. Have two kinds of vegetables at dinner. Limit foods that are high in a substance called oxalate. These include: Spinach (cooked), rhubarb, beets, sweet potatoes, and Swiss chard. Peanuts. Potato chips, french fries, and baked potatoes with skin on. Nuts and nut products. Chocolate. If you regularly take a diuretic medicine, make sure to eat at least 1 or 2 servings of fruits or vegetables that are high in potassium each day. These include: Avocado. Banana. Orange, prune,   carrot, or tomato juice. Baked potato. Cabbage. Beans and split peas. Lifestyle  Drink enough fluid to keep your urine pale yellow. This is the most important thing you can do. Spread your fluid intake throughout the day. If you drink alcohol: Limit how much you use to: 0-1 drink a day for women who are not pregnant. 0-2 drinks a day for men. Be aware of how much alcohol is in your  drink. In the [place] one drink equals one 12 oz bottle of beer (355 mL), one 5 oz glass of wine (148 mL), or one 1 oz glass of hard liquor (44 mL). Lose weight if told by your health care provider. Work with your dietitian to find an eating plan and weight loss strategies that work best for you. General information Talk to your health care provider and dietitian about taking daily supplements. You may be told the following depending on your health and the cause of your kidney stones: Not to take supplements with vitamin C. To take a calcium supplement. To take a daily probiotic supplement. To take other supplements such as magnesium, fish oil, or vitamin B6. Take over-the-counter and prescription medicines only as told by your health care provider. These include supplements. What foods should I limit? Limit your intake of the following foods, or eat them as told by your dietitian. Vegetables Spinach. Rhubarb. Beets. Canned vegetables. Pickles. Olives. Baked potatoes with skin. Grains Wheat bran. Baked goods. Salted crackers. Cereals high in sugar. Meats and other proteins Nuts. Nut butters. Large portions of meat, poultry, or fish. Salted, precooked, or cured meats, such as sausages, meat loaves, and hot dogs. Dairy Cheese. Beverages Regular soft drinks. Regular vegetable juice. Seasonings and condiments Seasoning blends with salt. Salad dressings. Soy sauce. Ketchup. Barbecue sauce. Other foods Canned soups. Canned pasta sauce. Casseroles. Pizza. Lasagna. Frozen meals. Potato chips. French fries. The items listed above may not be a complete list of foods and beverages you should limit. Contact a dietitian for more information. What foods should I avoid? Talk to your dietitian about specific foods you should avoid based on the type of kidney stones you have and your overall health. Fruits Grapefruit. The item listed above may not be a complete list of foods and beverages you should  avoid. Contact a dietitian for more information. Summary Kidney stones are deposits of minerals and salts that form inside your kidneys. You can lower your risk of kidney stones by making changes to your diet. The most important thing you can do is drink enough fluid. Drink enough fluid to keep your urine pale yellow. Talk to your dietitian about how much calcium you should have each day, and eat less salt and animal protein as told by your dietitian. This information is not intended to replace advice given to you by your health care provider. Make sure you discuss any questions you have with your health care provider. Document Revised: 06/24/2019 Document Reviewed: 06/24/2019 Elsevier Patient Education  2022 [employer]  

## 2021-06-26 LAB — URINALYSIS, ROUTINE W REFLEX MICROSCOPIC
Bilirubin, UA: NEGATIVE
Glucose, UA: NEGATIVE
Ketones, UA: NEGATIVE
Leukocytes,UA: NEGATIVE
Nitrite, UA: NEGATIVE
Protein,UA: NEGATIVE
RBC, UA: NEGATIVE
Specific Gravity, UA: 1.015 (ref 1.005–1.030)
Urobilinogen, Ur: 0.2 mg/dL (ref 0.2–1.0)
pH, UA: 5.5 (ref 5.0–7.5)

## 2021-06-29 ENCOUNTER — Other Ambulatory Visit: Payer: Self-pay | Admitting: Family Medicine

## 2021-07-02 ENCOUNTER — Other Ambulatory Visit: Payer: Self-pay

## 2021-07-02 ENCOUNTER — Ambulatory Visit (INDEPENDENT_AMBULATORY_CARE_PROVIDER_SITE_OTHER): Payer: Medicare Other | Admitting: Family Medicine

## 2021-07-02 VITALS — BP 110/68 | HR 118 | Temp 98.1°F | Ht 67.0 in | Wt 207.0 lb

## 2021-07-02 DIAGNOSIS — N200 Calculus of kidney: Secondary | ICD-10-CM | POA: Diagnosis not present

## 2021-07-02 DIAGNOSIS — Z794 Long term (current) use of insulin: Secondary | ICD-10-CM

## 2021-07-02 DIAGNOSIS — K51919 Ulcerative colitis, unspecified with unspecified complications: Secondary | ICD-10-CM | POA: Diagnosis not present

## 2021-07-02 DIAGNOSIS — N183 Chronic kidney disease, stage 3 unspecified: Secondary | ICD-10-CM

## 2021-07-02 DIAGNOSIS — E1122 Type 2 diabetes mellitus with diabetic chronic kidney disease: Secondary | ICD-10-CM | POA: Insufficient documentation

## 2021-07-02 DIAGNOSIS — K5190 Ulcerative colitis, unspecified, without complications: Secondary | ICD-10-CM | POA: Insufficient documentation

## 2021-07-02 DIAGNOSIS — J449 Chronic obstructive pulmonary disease, unspecified: Secondary | ICD-10-CM | POA: Diagnosis not present

## 2021-07-02 DIAGNOSIS — Z23 Encounter for immunization: Secondary | ICD-10-CM | POA: Diagnosis not present

## 2021-07-02 DIAGNOSIS — I10 Essential (primary) hypertension: Secondary | ICD-10-CM

## 2021-07-02 DIAGNOSIS — E785 Hyperlipidemia, unspecified: Secondary | ICD-10-CM

## 2021-07-02 DIAGNOSIS — Z720 Tobacco use: Secondary | ICD-10-CM

## 2021-07-02 MED ORDER — ALBUTEROL SULFATE HFA 108 (90 BASE) MCG/ACT IN AERS
INHALATION_SPRAY | RESPIRATORY_TRACT | 5 refills | Status: DC
Start: 2021-07-02 — End: 2022-02-27

## 2021-07-02 MED ORDER — BUPROPION HCL ER (SR) 150 MG PO TB12: 150.0000 mg | ORAL_TABLET | Freq: Two times a day (BID) | ORAL | 1 refills | Status: DC

## 2021-07-02 MED ORDER — ENALAPRIL MALEATE 20 MG PO TABS
20.0000 mg | ORAL_TABLET | Freq: Two times a day (BID) | ORAL | 1 refills | Status: DC
Start: 2021-07-02 — End: 2022-02-27

## 2021-07-02 MED ORDER — INSULIN DETEMIR 100 UNIT/ML ~~LOC~~ SOLN: 40.0000 [IU] | Freq: Two times a day (BID) | SUBCUTANEOUS | 3 refills | Status: DC

## 2021-07-02 MED ORDER — ATORVASTATIN CALCIUM 20 MG PO TABS
20.0000 mg | ORAL_TABLET | Freq: Every day | ORAL | 1 refills | Status: DC
Start: 2021-07-02 — End: 2022-01-17

## 2021-07-02 MED ORDER — TAMSULOSIN HCL 0.4 MG PO CAPS
0.4000 mg | ORAL_CAPSULE | Freq: Every day | ORAL | 3 refills | Status: DC
Start: 2021-07-02 — End: 2022-02-27

## 2021-07-02 MED ORDER — INSULIN ASPART 100 UNIT/ML IJ SOLN: INTRAMUSCULAR | 3 refills | Status: DC

## 2021-07-02 MED ORDER — VENLAFAXINE HCL ER 37.5 MG PO CP24: 37.5000 mg | ORAL_CAPSULE | Freq: Every day | ORAL | 3 refills | Status: DC

## 2021-07-02 MED ORDER — MESALAMINE 400 MG PO CPDR
DELAYED_RELEASE_CAPSULE | ORAL | 1 refills | Status: DC
Start: 2021-07-02 — End: 2022-02-27

## 2021-07-02 MED ORDER — LEVETIRACETAM 500 MG PO TABS: 1500.0000 mg | ORAL_TABLET | Freq: Two times a day (BID) | ORAL | 1 refills | Status: DC

## 2021-07-02 MED ORDER — FLUTICASONE-SALMETEROL 250-50 MCG/ACT IN AEPB
INHALATION_SPRAY | RESPIRATORY_TRACT | 1 refills | Status: DC
Start: 2021-07-02 — End: 2022-02-27

## 2021-07-02 MED ORDER — OZEMPIC (1 MG/DOSE) 4 MG/3ML ~~LOC~~ SOPN
PEN_INJECTOR | SUBCUTANEOUS | 3 refills | Status: DC
Start: 2021-07-02 — End: 2021-09-28

## 2021-07-02 MED ORDER — FUROSEMIDE 20 MG PO TABS: 20.0000 mg | ORAL_TABLET | Freq: Every day | ORAL | 1 refills | Status: DC

## 2021-07-02 NOTE — Assessment & Plan Note (Signed)
Stable at this time.  Continue mesalamine.  Needs colonoscopy in 2023.

## 2021-07-02 NOTE — Telephone Encounter (Signed)
Patient has been informed per drs notes; He cannot take this medication (has chronic kidney disease; this is contraindicated  Pt verbalizes understanding

## 2021-07-02 NOTE — Progress Notes (Signed)
Subjective:  Patient ID: Shaun Harris, male    DOB: 1966-06-10  Age: 55 y.o. MRN: [id number]  CC: Chief Complaint  Patient presents with   Diabetes    HPI:  55 year old male with an extensive past medical history including hypertension, COPD, ulcerative colitis, type 2 diabetes with complications, CKD stage III, hyperlipidemia, tobacco abuse, history of seizure, chronic pain presents for follow up.  Type 2 Diabetes Blood sugars readings - Fasting 120's - 150's. Hypoglycemia - No. Medications - Levemir 40 units twice daily; Novolog sliding scale; Ozempic 1 mg Adverse effects - None. Compliance - Yes. Preventative care Eye exam - In need of. Foot exam - In need of. Last A1C - 7.8 (04/04/21) Urine microalbumin - Followed by renal.  Hypertension BP is well controlled on enalapril and Lasix.  Hyperlipidemia Most recent lipid panel revealed very good control.  Patient is currently on Lipitor and Niaspan.  We will discuss discontinuation of niacin as it is no longer recommended.  Ulcerative colitis Has a history of ulcerative colitis.  Is on mesalamine.  Has not seen a GI physician since 2018. Per prior colonoscopy, needs repeat in 2023.  COPD and tobacco abuse Patient has some baseline shortness of breath.  Stable at this time.  Continues to smoke, 1 pack/day.  Advised need to cut back and quit.  Chronic pain Followed by pain management, Dr. Coralyn Mark.   Patient Active Problem List   Diagnosis Date Noted   Ulcerative colitis ([place]) 07/02/2021   Type 2 diabetes mellitus with diabetic chronic kidney disease ([place]) 07/02/2021   History of seizure 03/14/2021   Stage 3 chronic kidney disease ([place]) 02/06/2021   Nephrolithiasis 07/25/2020   COPD (chronic obstructive pulmonary disease) ([place]) 03/10/2013   Tobacco abuse 03/10/2013   Morbid obesity ([place]) 03/10/2013   Essential hypertension 10/01/2012   Hyperlipidemia 10/01/2012   Chronic pain syndrome 10/01/2012    Social Hx    Social History   Socioeconomic History   Marital status: Married    Spouse name: Not on file   Number of children: Not on file   Years of education: Not on file   Highest education level: Not on file  Occupational History   Not on file  Tobacco Use   Smoking status: Every Day    Packs/day: 1.00    Years: 35.00    Pack years: 35.00    Types: Cigarettes   Smokeless tobacco: Never  Vaping Use   Vaping Use: Never used  Substance and Sexual Activity   Alcohol use: No   Drug use: No   Sexual activity: Yes    Birth control/protection: None  Other Topics Concern   Not on file  Social History Narrative   Not on file   Social Determinants of Health   Financial Resource Strain: Not on file  Food Insecurity: Not on file  Transportation Needs: Not on file  Physical Activity: Not on file  Stress: Not on file  Social Connections: Not on file    Review of Systems  Constitutional: Negative.   Respiratory:  Positive for shortness of breath.    Objective:  BP 110/68    Pulse (!) 118    Temp 98.1 F (36.7 C)    Ht 5\' 7"  (1.702 m)    Wt 207 lb (93.9 kg)    SpO2 97%    BMI 32.42 kg/m   BP/Weight 07/02/2021 06/25/2021 4/85/4627  Systolic BP 035 90 009  Diastolic BP 68 56 73  Wt. (Lbs)  207 - 205  BMI 32.42 - 32.11    Physical Exam Constitutional:      General: He is not in acute distress.    Appearance: Normal appearance. He is obese. He is not ill-appearing.  HENT:     Head: Normocephalic and atraumatic.  Eyes:     General:        Right eye: No discharge.        Left eye: No discharge.     Conjunctiva/sclera: Conjunctivae normal.  Cardiovascular:     Rate and Rhythm: Normal rate and regular rhythm.  Pulmonary:     Effort: Pulmonary effort is normal.     Breath sounds: Normal breath sounds. No wheezing, rhonchi or rales.  Neurological:     Mental Status: He is alert.  Psychiatric:        Mood and Affect: Mood normal.        Behavior: Behavior normal.    Lab  Results  Component Value Date   WBC 7.7 01/23/2021   HGB 12.6 (L) 01/23/2021   HCT 37.6 01/23/2021   PLT 215 01/23/2021   GLUCOSE 186 (H) 02/19/2021   CHOL 111 01/23/2021   TRIG 83 01/23/2021   HDL 51 01/23/2021   LDLCALC 44 01/23/2021   ALT 27 01/23/2021   AST 22 01/23/2021   NA 142 02/19/2021   K 4.6 02/19/2021   CL 110 (H) 02/19/2021   CREATININE 1.49 (H) 02/19/2021   BUN 27 (H) 02/19/2021   CO2 17 (L) 02/19/2021   HGBA1C 7.5 (H) 01/23/2021     Assessment & Plan:   Problem List Items Addressed This Visit       Cardiovascular and Mediastinum   Essential hypertension    BP is well controlled.  Continue enalapril and Lasix.      Relevant Medications   atorvastatin (LIPITOR) 20 MG tablet   enalapril (VASOTEC) 20 MG tablet   furosemide (LASIX) 20 MG tablet     Respiratory   COPD (chronic obstructive pulmonary disease) (HCC)    Stable.      Relevant Medications   fluticasone-salmeterol (ADVAIR DISKUS) 250-50 MCG/ACT AEPB   albuterol (VENTOLIN HFA) 108 (90 Base) MCG/ACT inhaler     Digestive   Ulcerative colitis (HCC)    Stable at this time.  Continue mesalamine.  Needs colonoscopy in 2023.        Endocrine   Type 2 diabetes mellitus with diabetic chronic kidney disease ([place]) - Primary    Not at goal.  Advised to increase Levemir by 1 to 2 units daily until fasting blood sugars are around 100. Continue current NovoLog dosing as well as Ozempic.      Relevant Medications   atorvastatin (LIPITOR) 20 MG tablet   enalapril (VASOTEC) 20 MG tablet   insulin detemir (LEVEMIR) 100 UNIT/ML injection   insulin aspart (NOVOLOG) 100 UNIT/ML injection   OZEMPIC, 1 MG/DOSE, 4 MG/3ML SOPN     Genitourinary   Nephrolithiasis   Relevant Medications   tamsulosin (FLOMAX) 0.4 MG CAPS capsule     Other   Hyperlipidemia    Well-controlled.  Continue Lipitor.  Stopping Niaspan as that this is no longer recommended.      Relevant Medications   atorvastatin (LIPITOR)  20 MG tablet   enalapril (VASOTEC) 20 MG tablet   furosemide (LASIX) 20 MG tablet   Tobacco abuse    Advised to cut back and try to quit.      Other Visit Diagnoses  Immunization due       Relevant Orders   Flu Vaccine QUAD 32mo+IM (Fluarix, Fluzone & Alfiuria Quad PF) (Completed)       Meds ordered this encounter  Medications   fluticasone-salmeterol (ADVAIR DISKUS) 250-50 MCG/ACT AEPB    Sig: USE 1 INHALATION BY MOUTH EVERY 12 HOURS. <<RINSE MOUTH AFTER USE>>.    Dispense:  180 each    Refill:  1   albuterol (VENTOLIN HFA) 108 (90 Base) MCG/ACT inhaler    Sig: INHALE 2 PUFFS BY MOUTH EVERY 6 HOURS AS NEEDED FOR SHORTNESS OF BREATH.    Dispense:  18 g    Refill:  5   atorvastatin (LIPITOR) 20 MG tablet    Sig: Take 1 tablet (20 mg total) by mouth daily.    Dispense:  90 tablet    Refill:  1   buPROPion (WELLBUTRIN SR) 150 MG 12 hr tablet    Sig: Take 1 tablet (150 mg total) by mouth 2 (two) times daily.    Dispense:  180 tablet    Refill:  1   enalapril (VASOTEC) 20 MG tablet    Sig: Take 1 tablet (20 mg total) by mouth 2 (two) times daily.    Dispense:  180 tablet    Refill:  1   furosemide (LASIX) 20 MG tablet    Sig: Take 1 tablet (20 mg total) by mouth daily.    Dispense:  90 tablet    Refill:  1   insulin detemir (LEVEMIR) 100 UNIT/ML injection    Sig: Inject 0.4 mLs (40 Units total) into the skin 2 (two) times daily.    Dispense:  60 mL    Refill:  3   levETIRAcetam (KEPPRA) 500 MG tablet    Sig: Take 3 tablets (1,500 mg total) by mouth 2 (two) times daily.    Dispense:  540 tablet    Refill:  1   Mesalamine (ASACOL) 400 MG CPDR DR capsule    Sig: TAKE (1) CAPSULE BY MOUTH FOUR TIMES DAILY    Dispense:  360 capsule    Refill:  1   insulin aspart (NOVOLOG) 100 UNIT/ML injection    Sig: INJECT S.Q. 4 TO 12 UNITS FOUR TIMES DAILY PER SLIDING SCALE.    Dispense:  30 mL    Refill:  3   OZEMPIC, 1 MG/DOSE, 4 MG/3ML SOPN    Sig: INJECT 1MG  AS DIRECTED  EVERY WEDNESDAY.    Dispense:  9 mL    Refill:  3   tamsulosin (FLOMAX) 0.4 MG CAPS capsule    Sig: Take 1 capsule (0.4 mg total) by mouth daily after supper.    Dispense:  90 capsule    Refill:  3   venlafaxine XR (EFFEXOR-XR) 37.5 MG 24 hr capsule    Sig: Take 1 capsule (37.5 mg total) by mouth daily.    Dispense:  90 capsule    Refill:  3    Follow-up:  Return in about 3 months (around 09/30/2021).  [facility]

## 2021-07-02 NOTE — Assessment & Plan Note (Signed)
BP is well controlled.  Continue enalapril and Lasix.

## 2021-07-02 NOTE — Assessment & Plan Note (Signed)
Not at goal.  Advised to increase Levemir by 1 to 2 units daily until fasting blood sugars are around 100. Continue current NovoLog dosing as well as Ozempic.

## 2021-07-02 NOTE — Assessment & Plan Note (Signed)
Advised to cut back and try to quit.

## 2021-07-02 NOTE — Patient Instructions (Signed)
Stop niacin.  Increase your levemir by 1-2 units daily until blood sugars are better controlled.  Follow up in 3 months.  Take care  Dr. Lacinda Axon

## 2021-07-02 NOTE — Assessment & Plan Note (Signed)
Well-controlled.  Continue Lipitor.  Stopping Niaspan as that this is no longer recommended.

## 2021-07-02 NOTE — Assessment & Plan Note (Signed)
Stable

## 2021-07-06 ENCOUNTER — Telehealth: Payer: Self-pay | Admitting: Family Medicine

## 2021-07-06 NOTE — Telephone Encounter (Signed)
Left message for patient to call back and schedule Medicare Annual Wellness Visit (AWV) in office.   If unable to come into the office for AWV,  please offer to do virtually or by telephone.  Last AWV: 07/02/2018  Please schedule at anytime with [facility].  40 minute appointment  Any questions, please contact me at [phone]

## 2021-07-17 ENCOUNTER — Other Ambulatory Visit: Payer: Self-pay | Admitting: Family Medicine

## 2021-07-30 ENCOUNTER — Other Ambulatory Visit: Payer: Self-pay | Admitting: Family Medicine

## 2021-07-30 DIAGNOSIS — Z794 Long term (current) use of insulin: Secondary | ICD-10-CM

## 2021-07-30 DIAGNOSIS — E1165 Type 2 diabetes mellitus with hyperglycemia: Secondary | ICD-10-CM

## 2021-08-17 ENCOUNTER — Other Ambulatory Visit: Payer: Self-pay | Admitting: Family Medicine

## 2021-08-17 DIAGNOSIS — E119 Type 2 diabetes mellitus without complications: Secondary | ICD-10-CM

## 2021-08-17 DIAGNOSIS — Z794 Long term (current) use of insulin: Secondary | ICD-10-CM

## 2021-09-27 ENCOUNTER — Other Ambulatory Visit: Payer: Self-pay | Admitting: Nurse Practitioner

## 2021-10-01 ENCOUNTER — Ambulatory Visit (INDEPENDENT_AMBULATORY_CARE_PROVIDER_SITE_OTHER): Payer: Medicare Other | Admitting: Family Medicine

## 2021-10-01 ENCOUNTER — Other Ambulatory Visit: Payer: Self-pay

## 2021-10-01 VITALS — BP 120/70 | HR 90 | Temp 98.0°F | Ht 67.0 in | Wt 218.2 lb

## 2021-10-01 DIAGNOSIS — Z794 Long term (current) use of insulin: Secondary | ICD-10-CM

## 2021-10-01 DIAGNOSIS — E1122 Type 2 diabetes mellitus with diabetic chronic kidney disease: Secondary | ICD-10-CM

## 2021-10-01 DIAGNOSIS — N183 Chronic kidney disease, stage 3 unspecified: Secondary | ICD-10-CM

## 2021-10-01 DIAGNOSIS — I10 Essential (primary) hypertension: Secondary | ICD-10-CM | POA: Diagnosis not present

## 2021-10-01 DIAGNOSIS — N1831 Chronic kidney disease, stage 3a: Secondary | ICD-10-CM

## 2021-10-01 DIAGNOSIS — J449 Chronic obstructive pulmonary disease, unspecified: Secondary | ICD-10-CM

## 2021-10-01 DIAGNOSIS — E785 Hyperlipidemia, unspecified: Secondary | ICD-10-CM

## 2021-10-01 NOTE — Assessment & Plan Note (Signed)
Stable.  Advised smoking cessation. ?

## 2021-10-01 NOTE — Patient Instructions (Signed)
Continue your current medications. ? ?Cut back on the smoking. ? ?Follow up in 3-6 months. ? ?Take care ? ?Dr. Lacinda Axon  ?

## 2021-10-01 NOTE — Assessment & Plan Note (Signed)
A1c today for further evaluation.  Continue insulin, Ozempic. ?

## 2021-10-01 NOTE — Assessment & Plan Note (Signed)
At goal.  Continue enalapril and Lasix. ?

## 2021-10-01 NOTE — Assessment & Plan Note (Signed)
Lipid panel today. Continue Lipitor. 

## 2021-10-01 NOTE — Progress Notes (Signed)
? ?Subjective:  ?Patient ID: Shaun Harris, male    DOB: 1965-09-18  Age: 56 y.o. MRN: [id number] ? ?CC: ?Chief Complaint  ?Patient presents with  ? Follow-up  ?  3 month follow up on medications  ? ? ?HPI: ? ?56 year old male with hypertension, COPD, tobacco abuse, ulcerative colitis, type 2 diabetes with complications, CKD 3, hyperlipidemia, chronic pain presents for follow-up. ? ?Type 2 diabetes ?Patient states that his blood sugar readings have improved.  Fasting blood sugars ranging from 100-120. ?Medications: Levemir 40 units twice daily, NovoLog, Ozempic. ?No reports of hypoglycemia. ? ?CKD ?Follows closely with nephrology.  Seen earlier this month.  Labs in [place]. ?Renal function has been stable.  Associated mild anemia. ? ?Hypertension ?At goal on enalapril, Lasix. ? ?Hyperlipidemia ?Has been well controlled.  Needs labs.  Currently on Lipitor 20. ? ?COPD ?Stable.  Continues to smoke approximately 1 pack/day.  ? ?Patient Active Problem List  ? Diagnosis Date Noted  ? Ulcerative colitis ([place]) 07/02/2021  ? Type 2 diabetes mellitus with diabetic chronic kidney disease ([place]) 07/02/2021  ? History of seizure 03/14/2021  ? Stage 3 chronic kidney disease ([place]) 02/06/2021  ? Nephrolithiasis 07/25/2020  ? COPD (chronic obstructive pulmonary disease) ([place]) 03/10/2013  ? Tobacco abuse 03/10/2013  ? Morbid obesity ([place]) 03/10/2013  ? Essential hypertension 10/01/2012  ? Hyperlipidemia 10/01/2012  ? Chronic pain syndrome 10/01/2012  ? ? ?Social Hx   ?Social History  ? ?Socioeconomic History  ? Marital status: Married  ?  Spouse name: Not on file  ? Number of children: Not on file  ? Years of education: Not on file  ? Highest education level: Not on file  ?Occupational History  ? Not on file  ?Tobacco Use  ? Smoking status: Every Day  ?  Packs/day: 1.00  ?  Years: 35.00  ?  Pack years: 35.00  ?  Types: Cigarettes  ? Smokeless tobacco: Never  ?Vaping Use  ? Vaping Use: Never used  ?Substance and Sexual Activity   ? Alcohol use: No  ? Drug use: No  ? Sexual activity: Yes  ?  Birth control/protection: None  ?Other Topics Concern  ? Not on file  ?Social History Narrative  ? Not on file  ? ?Social Determinants of Health  ? ?Financial Resource Strain: Not on file  ?Food Insecurity: Not on file  ?Transportation Needs: Not on file  ?Physical Activity: Not on file  ?Stress: Not on file  ?Social Connections: Not on file  ? ?Review of Systems  ?Constitutional: Negative.   ?Respiratory:  Positive for shortness of breath.   ?Genitourinary: Negative.   ? ? ?Objective:  ?BP 120/70   Pulse 90   Temp 98 ?F (36.7 ?C) (Oral)   Ht _0  (1.702 m)   Wt 218 lb 3.2 oz (99 kg)   SpO2 98%   BMI 34.17 kg/m?  ? ?BP/Weight 10/01/2021 07/02/2021 06/25/2021  ?Systolic BP 010 932 90  ?Diastolic BP 70 68 56  ?Wt. (Lbs) 218.2 207 -  ?BMI 34.17 32.42 -  ? ? ?Physical Exam ?Constitutional:   ?   General: He is not in acute distress. ?   Appearance: Normal appearance. He is obese.  ?HENT:  ?   Head: Normocephalic and atraumatic.  ?Eyes:  ?   General:     ?   Right eye: No discharge.     ?   Left eye: No discharge.  ?   Conjunctiva/sclera: Conjunctivae normal.  ?  Cardiovascular:  ?   Rate and Rhythm: Normal rate and regular rhythm.  ?Pulmonary:  ?   Effort: Pulmonary effort is normal.  ?   Breath sounds: Normal breath sounds. No wheezing or rales.  ?Neurological:  ?   Mental Status: He is alert.  ?Psychiatric:     ?   Mood and Affect: Mood normal.     ?   Behavior: Behavior normal.  ? ? ?Lab Results  ?Component Value Date  ? WBC 7.7 01/23/2021  ? HGB 12.6 (L) 01/23/2021  ? HCT 37.6 01/23/2021  ? PLT 215 01/23/2021  ? GLUCOSE 186 (H) 02/19/2021  ? CHOL 111 01/23/2021  ? TRIG 83 01/23/2021  ? HDL 51 01/23/2021  ? LDLCALC 44 01/23/2021  ? ALT 27 01/23/2021  ? AST 22 01/23/2021  ? NA 142 02/19/2021  ? K 4.6 02/19/2021  ? CL 110 (H) 02/19/2021  ? CREATININE 1.49 (H) 02/19/2021  ? BUN 27 (H) 02/19/2021  ? CO2 17 (L) 02/19/2021  ? HGBA1C 7.5 (H) 01/23/2021   ? ? ? ?Assessment & Plan:  ? ?Problem List Items Addressed This Visit   ? ?  ? Cardiovascular and Mediastinum  ? Essential hypertension  ?  At goal.  Continue enalapril and Lasix. ?  ?  ?  ? Respiratory  ? COPD (chronic obstructive pulmonary disease) ([place])  ?  Stable.  Advised smoking cessation. ?  ?  ?  ? Endocrine  ? Type 2 diabetes mellitus with diabetic chronic kidney disease ([place]) - Primary  ?  A1c today for further evaluation.  Continue insulin, Ozempic. ?  ?  ? Relevant Orders  ? Hemoglobin A1c  ?  ? Genitourinary  ? Stage 3 chronic kidney disease ([place])  ?  Stable.  Follows closely with nephrology. ?  ?  ? Relevant Orders  ? CBC  ? CMP14+EGFR  ?  ? Other  ? Hyperlipidemia  ?  Lipid panel today.  Continue Lipitor ?  ?  ? Relevant Orders  ? Lipid panel  ? ?Follow-up: 3 to 6 months ? ?Thersa Salt DO ?[place] ? ?

## 2021-10-01 NOTE — Assessment & Plan Note (Signed)
Stable.  Follows closely with nephrology. 

## 2021-10-02 LAB — LIPID PANEL
Chol/HDL Ratio: 2.7 ratio (ref 0.0–5.0)
Cholesterol, Total: 140 mg/dL (ref 100–199)
HDL: 52 mg/dL (ref >39)
LDL Chol Calc (NIH): 70 mg/dL (ref 0–99)
Triglycerides: 98 mg/dL (ref 0–149)
VLDL Cholesterol Cal: 18 mg/dL (ref 5–40)

## 2021-10-02 LAB — CMP14+EGFR
ALT: 17 IU/L (ref 0–44)
AST: 14 IU/L (ref 0–40)
Albumin/Globulin Ratio: 2.6 — ABNORMAL HIGH (ref 1.2–2.2)
Albumin: 4.6 g/dL (ref 3.8–4.9)
Alkaline Phosphatase: 120 IU/L (ref 44–121)
BUN/Creatinine Ratio: 15 (ref 9–20)
BUN: 20 mg/dL (ref 6–24)
Bilirubin Total: <0.2 mg/dL (ref 0.0–1.2)
CO2: 27 mmol/L (ref 20–29)
Calcium: 9.5 mg/dL (ref 8.7–10.2)
Chloride: 103 mmol/L (ref 96–106)
Creatinine, Ser: 1.36 mg/dL — ABNORMAL HIGH (ref 0.76–1.27)
Globulin, Total: 1.8 g/dL (ref 1.5–4.5)
Glucose: 170 mg/dL — ABNORMAL HIGH (ref 70–99)
Potassium: 5.1 mmol/L (ref 3.5–5.2)
Sodium: 142 mmol/L (ref 134–144)
Total Protein: 6.4 g/dL (ref 6.0–8.5)
eGFR: 61 mL/min/{1.73_m2} (ref >59)

## 2021-10-02 LAB — HEMOGLOBIN A1C
Est. average glucose Bld gHb Est-mCnc: 166 mg/dL
Hgb A1c MFr Bld: 7.4 % — ABNORMAL HIGH (ref 4.8–5.6)

## 2021-10-02 LAB — CBC
Hematocrit: 37.8 % (ref 37.5–51.0)
Hemoglobin: 12.9 g/dL — ABNORMAL LOW (ref 13.0–17.7)
MCH: 31.7 pg (ref 26.6–33.0)
MCHC: 34.1 g/dL (ref 31.5–35.7)
MCV: 93 fL (ref 79–97)
Platelets: 238 10*3/uL (ref 150–450)
RBC: 4.07 x10E6/uL — ABNORMAL LOW (ref 4.14–5.80)
RDW: 12.2 % (ref 11.6–15.4)
WBC: 7.5 10*3/uL (ref 3.4–10.8)

## 2021-10-08 ENCOUNTER — Encounter (INDEPENDENT_AMBULATORY_CARE_PROVIDER_SITE_OTHER): Payer: Self-pay | Admitting: *Deleted

## 2021-10-18 ENCOUNTER — Other Ambulatory Visit: Payer: Self-pay | Admitting: Family Medicine

## 2021-10-23 ENCOUNTER — Ambulatory Visit (INDEPENDENT_AMBULATORY_CARE_PROVIDER_SITE_OTHER): Payer: Medicare Other | Admitting: Family Medicine

## 2021-10-23 VITALS — BP 127/79 | HR 120 | Temp 98.3°F | Ht 67.0 in | Wt 222.8 lb

## 2021-10-23 DIAGNOSIS — L03818 Cellulitis of other sites: Secondary | ICD-10-CM | POA: Diagnosis not present

## 2021-10-23 DIAGNOSIS — L0390 Cellulitis, unspecified: Secondary | ICD-10-CM | POA: Insufficient documentation

## 2021-10-23 MED ORDER — CEFTRIAXONE SODIUM 1 G IJ SOLR
1.0000 g | Freq: Once | INTRAMUSCULAR | Status: AC
  Administered 2021-10-23: 1 g via INTRAMUSCULAR

## 2021-10-23 MED ORDER — MUPIROCIN 2 % EX OINT: 1.0000 "application " | TOPICAL_OINTMENT | Freq: Two times a day (BID) | CUTANEOUS | 0 refills | Status: AC

## 2021-10-23 MED ORDER — DOXYCYCLINE HYCLATE 100 MG PO TABS: 100.0000 mg | ORAL_TABLET | Freq: Two times a day (BID) | ORAL | 0 refills | Status: DC

## 2021-10-23 NOTE — Progress Notes (Signed)
? ?Subjective:  ?Patient ID: Shaun Harris, male    DOB: 1966/03/24  Age: 56 y.o. MRN: [id number] ? ?CC: ?Chief Complaint  ?Patient presents with  ? sores on back  ?  Noticed sores a couple of days ago. They have been draining.  ? ? ?HPI: ? ?56 year old male with multiple comorbidities presents for evaluation of the above. ? ?Patient states that he noticed the areas on Friday.  He states that he is unsure of exactly how or why they came up.  He states that he has "sores" on his back.  He states that the areas have been draining.  He believes that they are infected.  No new medications or medication changes.  He states that they are quite painful.  No fever.  No relieving factors. ? ?Patient Active Problem List  ? Diagnosis Date Noted  ? Cellulitis 10/23/2021  ? Ulcerative colitis ([place]) 07/02/2021  ? Type 2 diabetes mellitus with diabetic chronic kidney disease ([place]) 07/02/2021  ? History of seizure 03/14/2021  ? Stage 3 chronic kidney disease ([place]) 02/06/2021  ? Nephrolithiasis 07/25/2020  ? COPD (chronic obstructive pulmonary disease) ([place]) 03/10/2013  ? Tobacco abuse 03/10/2013  ? Morbid obesity ([place]) 03/10/2013  ? Essential hypertension 10/01/2012  ? Hyperlipidemia 10/01/2012  ? Chronic pain syndrome 10/01/2012  ? ? ?Social Hx   ?Social History  ? ?Socioeconomic History  ? Marital status: Married  ?  Spouse name: Not on file  ? Number of children: Not on file  ? Years of education: Not on file  ? Highest education level: Not on file  ?Occupational History  ? Not on file  ?Tobacco Use  ? Smoking status: Every Day  ?  Packs/day: 1.00  ?  Years: 35.00  ?  Pack years: 35.00  ?  Types: Cigarettes  ? Smokeless tobacco: Never  ?Vaping Use  ? Vaping Use: Never used  ?Substance and Sexual Activity  ? Alcohol use: No  ? Drug use: No  ? Sexual activity: Yes  ?  Birth control/protection: None  ?Other Topics Concern  ? Not on file  ?Social History Narrative  ? Not on file  ? ?Social Determinants of Health  ? ?Financial  Resource Strain: Not on file  ?Food Insecurity: Not on file  ?Transportation Needs: Not on file  ?Physical Activity: Not on file  ?Stress: Not on file  ?Social Connections: Not on file  ? ? ?Review of Systems ?Per HPI ? ?Objective:  ?BP 127/79   Pulse (!) 120   Temp 98.3 ?F (36.8 ?C) (Oral)   Ht '5\' 7"'$  (1.702 m)   Wt 222 lb 12.8 oz (101.1 kg)   SpO2 98%   BMI 34.90 kg/m?  ? ? ?  10/23/2021  ?  3:36 PM 10/01/2021  ?  9:35 AM 07/02/2021  ?  8:33 AM  ?BP/Weight  ?Systolic BP 099 833 825  ?Diastolic BP 79 70 68  ?Wt. (Lbs) 222.8 218.2 207  ?BMI 34.9 kg/m2 34.17 kg/m2 32.42 kg/m2  ? ? ?Physical Exam ?Constitutional:   ?   Comments: Chronically ill-appearing male.  Appears older than stated age.  ?HENT:  ?   Head: Normocephalic and atraumatic.  ?Eyes:  ?   General:     ?   Right eye: No discharge.     ?   Left eye: No discharge.  ?   Conjunctiva/sclera: Conjunctivae normal.  ?Cardiovascular:  ?   Rate and Rhythm: Regular rhythm. Tachycardia present.  ?Pulmonary:  ?  Effort: Pulmonary effort is normal. No respiratory distress.  ?Skin: ?   Comments: Patient has several areas of erythema with overlying eschar.  Some of the areas have been draining.  See pictures.  ? ? ? ? ? ?Lab Results  ?Component Value Date  ? WBC 7.5 10/01/2021  ? HGB 12.9 (L) 10/01/2021  ? HCT 37.8 10/01/2021  ? PLT 238 10/01/2021  ? GLUCOSE 170 (H) 10/01/2021  ? CHOL 140 10/01/2021  ? TRIG 98 10/01/2021  ? HDL 52 10/01/2021  ? [place] 70 10/01/2021  ? ALT 17 10/01/2021  ? AST 14 10/01/2021  ? NA 142 10/01/2021  ? K 5.1 10/01/2021  ? CL 103 10/01/2021  ? CREATININE 1.36 (H) 10/01/2021  ? BUN 20 10/01/2021  ? CO2 27 10/01/2021  ? HGBA1C 7.4 (H) 10/01/2021  ? ? ? ?Assessment & Plan:  ? ?Problem List Items Addressed This Visit   ? ?  ? Other  ? Cellulitis - Primary  ?  Patient has multiple areas of superficial skin infection/cellulitis.  Rocephin given today.  Placing on doxycycline and Bactroban ointment. ?  ?  ? ? ?Meds ordered this encounter   ?Medications  ? doxycycline (VIBRA-TABS) 100 MG tablet  ?  Sig: Take 1 tablet (100 mg total) by mouth 2 (two) times daily.  ?  Dispense:  20 tablet  ?  Refill:  0  ? mupirocin ointment (BACTROBAN) 2 %  ?  Sig: Apply 1 application. topically 2 (two) times daily for 7 days.  ?  Dispense:  30 g  ?  Refill:  0  ? cefTRIAXone (ROCEPHIN) injection 1 g  ? ?Thersa Salt DO ?[place] ? ?

## 2021-10-23 NOTE — Assessment & Plan Note (Signed)
Patient has multiple areas of superficial skin infection/cellulitis.  Rocephin given today.  Placing on doxycycline and Bactroban ointment. ?

## 2021-10-23 NOTE — Patient Instructions (Signed)
Medication as prescribed. ? ?If this worsens or fails to improve, please let me know. ? ?Take care ? ?Dr. Lacinda Axon ?

## 2021-12-24 ENCOUNTER — Ambulatory Visit: Payer: Medicare Other | Admitting: Urology

## 2021-12-26 ENCOUNTER — Ambulatory Visit (HOSPITAL_COMMUNITY)
Admission: RE | Admit: 2021-12-26 | Discharge: 2021-12-26 | Disposition: A | Discharge status: Home / Self Care | Payer: Medicare Other | Source: Ambulatory Visit | Attending: Urology | Admitting: Urology

## 2021-12-26 DIAGNOSIS — N200 Calculus of kidney: Secondary | ICD-10-CM | POA: Diagnosis present

## 2022-01-01 ENCOUNTER — Ambulatory Visit (INDEPENDENT_AMBULATORY_CARE_PROVIDER_SITE_OTHER): Payer: Medicare Other | Admitting: Family Medicine

## 2022-01-01 ENCOUNTER — Ambulatory Visit (INDEPENDENT_AMBULATORY_CARE_PROVIDER_SITE_OTHER): Payer: Medicare Other | Admitting: Urology

## 2022-01-01 ENCOUNTER — Encounter: Payer: Self-pay | Admitting: Family Medicine

## 2022-01-01 ENCOUNTER — Encounter: Payer: Self-pay | Admitting: Urology

## 2022-01-01 VITALS — BP 81/53 | HR 125

## 2022-01-01 VITALS — BP 114/80 | HR 103 | Temp 97.3°F | Ht 67.0 in | Wt 227.0 lb

## 2022-01-01 DIAGNOSIS — E1122 Type 2 diabetes mellitus with diabetic chronic kidney disease: Secondary | ICD-10-CM

## 2022-01-01 DIAGNOSIS — N183 Chronic kidney disease, stage 3 unspecified: Secondary | ICD-10-CM

## 2022-01-01 DIAGNOSIS — I10 Essential (primary) hypertension: Secondary | ICD-10-CM

## 2022-01-01 DIAGNOSIS — N400 Enlarged prostate without lower urinary tract symptoms: Secondary | ICD-10-CM

## 2022-01-01 DIAGNOSIS — N200 Calculus of kidney: Secondary | ICD-10-CM | POA: Diagnosis not present

## 2022-01-01 DIAGNOSIS — Z794 Long term (current) use of insulin: Secondary | ICD-10-CM | POA: Diagnosis not present

## 2022-01-01 DIAGNOSIS — E785 Hyperlipidemia, unspecified: Secondary | ICD-10-CM | POA: Diagnosis not present

## 2022-01-01 DIAGNOSIS — K51919 Ulcerative colitis, unspecified with unspecified complications: Secondary | ICD-10-CM

## 2022-01-01 DIAGNOSIS — J449 Chronic obstructive pulmonary disease, unspecified: Secondary | ICD-10-CM

## 2022-01-01 DIAGNOSIS — Z720 Tobacco use: Secondary | ICD-10-CM

## 2022-01-01 MED ORDER — SEMAGLUTIDE-WEIGHT MANAGEMENT 1.7 MG/0.75ML ~~LOC~~ SOAJ
1.7000 mg | SUBCUTANEOUS | 0 refills | Status: DC
Start: 2022-01-01 — End: 2022-01-01

## 2022-01-01 MED ORDER — POTASSIUM CITRATE ER 15 MEQ (1620 MG) PO TBCR
1.0000 | EXTENDED_RELEASE_TABLET | Freq: Two times a day (BID) | ORAL | 3 refills | Status: DC
Start: 2022-01-01 — End: 2023-01-27

## 2022-01-01 MED ORDER — SODIUM BICARBONATE 650 MG PO TABS: 650.0000 mg | ORAL_TABLET | Freq: Two times a day (BID) | ORAL | 11 refills | Status: DC

## 2022-01-01 NOTE — Assessment & Plan Note (Signed)
Has been well controlled.  Continue statin. 

## 2022-01-01 NOTE — Assessment & Plan Note (Signed)
Needs colonoscopy later this year.  Patient has the information at home.  Continue mesalamine.

## 2022-01-01 NOTE — Progress Notes (Signed)
Subjective:  Patient ID: Shaun Harris, male    DOB: 1966-07-01  Age: 56 y.o. MRN: [id number]  CC: Chief Complaint  Patient presents with   3 month follow up     Dm 2 , htn, copd,     HPI:  56 year old male with an extensive past medical history including COPD, ulcerative colitis, type 2 diabetes with chronic kidney disease, BPH, nephrolithiasis, chronic pain syndrome, history of seizure, morbid obesity, tobacco abuse, hyperlipidemia, migraine presents for follow-up.  Patient is followed closely by nephrology.  His blood pressure is well controlled on Lasix and enalapril.  Most recent creatinine 1.52.  Hyperlipidemia has been well controlled.  Last LDL was 70.  He is compliant with atorvastatin.  Patient states that his blood sugars have been running high.  He states that they have been in the 250s to 300s.  He is unsure why.  He is on Levemir, NovoLog, and Ozempic.  Patient reports ongoing diarrhea.  He has underlying ulcerative colitis.  Needs colonoscopy this year.  Remains on mesalamine.  Patient continues to smoke.  Discussed CT lung cancer screening today.  Patient wants to wait at this time as he has a lot going on.  Patient Active Problem List   Diagnosis Date Noted   BPH (benign prostatic hyperplasia) 01/01/2022   Ulcerative colitis ([place]) 07/02/2021   Type 2 diabetes mellitus with diabetic chronic kidney disease ([place]) 07/02/2021   History of seizure 03/14/2021   Stage 3 chronic kidney disease ([place]) 02/06/2021   Nephrolithiasis 07/25/2020   COPD (chronic obstructive pulmonary disease) ([place]) 03/10/2013   Tobacco abuse 03/10/2013   Morbid obesity ([place]) 03/10/2013   Essential hypertension 10/01/2012   Hyperlipidemia 10/01/2012   Chronic pain syndrome 10/01/2012    Social Hx   Social History   Socioeconomic History   Marital status: Married    Spouse name: Not on file   Number of children: Not on file   Years of education: Not on file   Highest education level:  Not on file  Occupational History   Not on file  Tobacco Use   Smoking status: Every Day    Packs/day: 1.00    Years: 35.00    Total pack years: 35.00    Types: Cigarettes   Smokeless tobacco: Never  Vaping Use   Vaping Use: Never used  Substance and Sexual Activity   Alcohol use: No   Drug use: No   Sexual activity: Yes    Birth control/protection: None  Other Topics Concern   Not on file  Social History Narrative   Not on file   Social Determinants of Health   Financial Resource Strain: Not on file  Food Insecurity: Not on file  Transportation Needs: Not on file  Physical Activity: Not on file  Stress: Not on file  Social Connections: Not on file    Review of Systems  Constitutional: Negative.   Respiratory:  Positive for shortness of breath.   Neurological:  Positive for headaches.    Objective:  BP 114/80   Pulse (!) 103   Temp (!) 97.3 F (36.3 C)   Ht '5\' 7"'$  (1.702 m)   Wt 227 lb (103 kg)   SpO2 98%   BMI 35.55 kg/m      01/01/2022   10:44 AM 01/01/2022    9:40 AM 10/23/2021    3:36 PM  BP/Weight  Systolic BP 81 751 025  Diastolic BP 53 80 79  Wt. (Lbs)  227 222.8  BMI  35.55 kg/m2 34.9 kg/m2    Physical Exam Vitals and nursing note reviewed.  Constitutional:      General: He is not in acute distress.    Appearance: He is obese.  HENT:     Head: Normocephalic and atraumatic.  Eyes:     General:        Right eye: No discharge.        Left eye: No discharge.     Conjunctiva/sclera: Conjunctivae normal.  Cardiovascular:     Rate and Rhythm: Regular rhythm. Tachycardia present.  Pulmonary:     Effort: Pulmonary effort is normal.     Breath sounds: Normal breath sounds. No wheezing, rhonchi or rales.  Neurological:     Mental Status: He is alert.  Psychiatric:        Mood and Affect: Mood normal.        Behavior: Behavior normal.   Diabetic Foot Check -  Appearance - no lesions, ulcers or calluses Skin - no unusual pallor or  redness Monofilament testing -  Right - Great toe, medial, central, lateral ball and posterior foot intact Left - Great toe, medial, central, lateral ball and posterior foot intact   Lab Results  Component Value Date   WBC 7.5 10/01/2021   HGB 12.9 (L) 10/01/2021   HCT 37.8 10/01/2021   PLT 238 10/01/2021   GLUCOSE 170 (H) 10/01/2021   CHOL 140 10/01/2021   TRIG 98 10/01/2021   HDL 52 10/01/2021   LDLCALC 70 10/01/2021   ALT 17 10/01/2021   AST 14 10/01/2021   NA 142 10/01/2021   K 5.1 10/01/2021   CL 103 10/01/2021   CREATININE 1.36 (H) 10/01/2021   BUN 20 10/01/2021   CO2 27 10/01/2021   HGBA1C 7.4 (H) 10/01/2021     Assessment & Plan:   Problem List Items Addressed This Visit       Cardiovascular and Mediastinum   Essential hypertension    Well-controlled.  Continue enalapril and Lasix.        Respiratory   COPD (chronic obstructive pulmonary disease) (HCC)    Stable.  Advised smoking cessation.        Digestive   Ulcerative colitis ([place])    Needs colonoscopy later this year.  Patient has the information at home.  Continue mesalamine.        Endocrine   Type 2 diabetes mellitus with diabetic chronic kidney disease ([place]) - Primary    Patient states that his glycemic control is worsening.  A1c today.  Continue current medications for now.  We will make adjustments based on A1c.      Relevant Orders   Hemoglobin A1c     Genitourinary   BPH (benign prostatic hyperplasia)     Other   Hyperlipidemia    Has been well controlled.  Continue statin.      Relevant Orders   Lipid panel   Tobacco abuse    Advised that he needs to quit smoking.  Discussed CT lung cancer screening.  We will revisit at follow-up.       Follow-up:  3 months  [place]

## 2022-01-01 NOTE — Assessment & Plan Note (Signed)
Well-controlled.  Continue enalapril and Lasix.

## 2022-01-01 NOTE — Patient Instructions (Signed)
We will call with the results.  Follow up in 3 months.  Take care  Dr. Lacinda Axon

## 2022-01-01 NOTE — Progress Notes (Signed)
01/01/2022 11:25 AM   Shaun Harris Nov 13, 1965 [id number]  Referring provider: Erven Colla, DO [place],  [place] [address]  Followup nephrolithiasis   HPI: Mr Baugh is a 56yo here for followup for nephrolithiasis. KUB 12/27/2021 shows a stable 31m right lower pole calculus. Urine pH is 5.5 on urocitK 164m BID and sodium bicarb 6504mID. No flank pain. No stone vents since last visit.    PMH: Past Medical History:  Diagnosis Date   Anxiety    Arthritis    Cerebral vasculitis    Chronic headaches    constant since possible stroke in 1997,"Vasculitis of brain with increased pressure of brain ".   COPD (chronic obstructive pulmonary disease) (HCC)    COPD (chronic obstructive pulmonary disease) (HCC)    Diabetes mellitus    Hyperlipidemia    Hypertension    Hypertriglyceridemia    Sleep apnea    has but doesnt use, cannot tolerate; PCP aware   [facility]  [facility],"they arent sure if i had a stroke in 1997, there is still a question about that with my doctors". No deficits.   Tobacco abuse    [place] ([facility]  [place]ative [place] ([facility]  Venous stasis     Surgical History: Past Surgical History:  Procedure Laterality Date   BIOPSY  10/25/2016   Procedure: BIOPSY;  Surgeon: NajRogene HoustonD;  [facility];  Service: Endoscopy;;  colon   CERVICAL SPINE SURGERY  2008   x2-last surgery 04/24/2016.   COLONOSCOPY WITH PROPOFOL N/A 10/25/2016   Procedure: COLONOSCOPY WITH PROPOFOL;  Surgeon: NajRogene HoustonD;  Location: [facility];  Service: Endoscopy;  Laterality: N/A;  7:30   EXTRACORPOREAL SHOCK WAVE LITHOTRIPSY Left 08/08/2020   Procedure: EXTRACORPOREAL SHOCK WAVE LITHOTRIPSY (ESWL);  Surgeon: McKCleon GustinD;  Location: [facility];  Service: Urology;  Laterality: Left;   KNEE ARTHROSCOPY  02/25/2012   Procedure: ARTHROSCOPY KNEE;  Surgeon: WaySanjuana KavaD;  Location: [facility];  Service: Orthopedics;  Laterality: Right;    KNEE ARTHROSCOPY WITH MEDIAL MENISECTOMY Left 12/23/2014   Procedure: LEFT KNEE ARTHROSCOPY WITH PARTIAL MENISECTOMY;  Surgeon: WaySanjuana KavaD;  Location: [facility];  Service: Orthopedics;  Laterality: Left;   POLYPECTOMY  10/25/2016   Procedure: POLYPECTOMY;  Surgeon: NajRogene HoustonD;  [facility];  Service: Endoscopy;;  colon   SHOULDER ARTHROSCOPY  2004   left shoulder   SHOULDER ARTHROSCOPY  2008   right shoulder    Home Medications:  Allergies as of 01/01/2022       Reactions   Gabapentin Other (See Comments)   Increased blood sugar        Medication List        Accurate as of January 01, 2022 11:25 AM. If you have any questions, ask your nurse or doctor.          STOP taking these medications    doxycycline 100 MG tablet Commonly known as: VIBRA-TABS Stopped by: JayCoral SpikesO       TAKE these medications    acetaminophen 500 MG tablet Commonly known as: TYLENOL Take 1,000 mg by mouth every 6 (six) hours as needed for moderate pain.   Aimovig 70 MG/ML Soaj Generic drug: Erenumab-aooe Inject 70 mg into the skin every 30 (thirty) days.   albuterol 108 (90 Base) MCG/ACT inhaler Commonly known as: VENTOLIN HFA INHALE 2 PUFFS BY MOUTH  EVERY 6 HOURS AS NEEDED FOR SHORTNESS OF BREATH.   ALPRAZolam 0.5 MG tablet Commonly known as: XANAX Take 0.5 mg by mouth 2 (two) times daily.   aspirin EC 325 MG tablet Take 325 mg by mouth at bedtime.   atorvastatin 20 MG tablet Commonly known as: LIPITOR Take 1 tablet (20 mg total) by mouth daily.   Blood Glucose Monitoring Suppl w/Device Kit Test glucose 4 times daily. E11.9   buPROPion 150 MG 12 hr tablet Commonly known as: WELLBUTRIN SR Take 1 tablet (150 mg total) by mouth 2 (two) times daily.   CENTRUM PO Take 1 tablet by mouth daily.   enalapril 20 MG tablet Commonly known as: VASOTEC Take 1 tablet (20 mg total) by mouth 2 (two) times daily.   fluticasone-salmeterol 250-50 MCG/ACT  Aepb Commonly known as: Advair Diskus USE 1 INHALATION BY MOUTH EVERY 12 HOURS. <<RINSE MOUTH AFTER USE>>.   furosemide 20 MG tablet Commonly known as: LASIX Take 1 tablet (20 mg total) by mouth daily.   hydrocortisone 2.5 % rectal cream Commonly known as: ANUSOL-HC USE 1 APPLICATORFUL RECTALLY TWICE DAILY   hydrOXYzine 50 MG tablet Commonly known as: ATARAX Take 50 mg by mouth daily as needed for anxiety.   insulin aspart 100 UNIT/ML injection Commonly known as: NovoLOG INJECT S.Q. 4 TO 12 UNITS FOUR TIMES DAILY PER SLIDING SCALE.   insulin detemir 100 UNIT/ML injection Commonly known as: Levemir Inject 0.4 mLs (40 Units total) into the skin 2 (two) times daily.   levETIRAcetam 500 MG tablet Commonly known as: KEPPRA Take 3 tablets (1,500 mg total) by mouth 2 (two) times daily.   Mesalamine 400 MG Cpdr DR capsule Commonly known as: ASACOL TAKE (1) CAPSULE BY MOUTH FOUR TIMES DAILY   morphine 30 MG 12 hr tablet Commonly known as: MS CONTIN Take 1 tablet (30 mg total) by mouth 2 (two) times daily. May fill 60 days after 09/30/2017   Oxycodone HCl 10 MG Tabs Take 1 tablet by mouth 4 times a day as needed for pain. Do not drive or operate machinery while on this medicine. What changed:  how much to take how to take this when to take this reasons to take this additional instructions   Ozempic (1 MG/DOSE) 4 MG/3ML Sopn Generic drug: Semaglutide (1 MG/DOSE) INJECT 1MG AS DIRECTED EVERY WEDNESDAY.   Potassium Citrate 15 MEQ (1620 MG) Tbcr Take by mouth.   promethazine 25 MG tablet Commonly known as: PHENERGAN Take 0.5 tablets (12.5 mg total) by mouth every 6 (six) hours as needed for nausea or vomiting. What changed: how much to take   tamsulosin 0.4 MG Caps capsule Commonly known as: FLOMAX Take 1 capsule (0.4 mg total) by mouth daily after supper.   terazosin 10 MG capsule Commonly known as: HYTRIN TAKE (1) CAPSULE BY MOUTH ONCE DAILY.   tiZANidine 4 MG  tablet Commonly known as: ZANAFLEX TAKE 1 TABLET THREE TIMES DAILY AS NEEDED FOR MUSCLE SPASM   True Metrix Blood Glucose Test test strip Generic drug: glucose blood CHECK BLOOD SUGAR 4 TIMES DAILY.   True Metrix Blood Glucose Test test strip Generic drug: glucose blood CHECK BLOOD SUGAR 4 TIMES DAILY.   TRUEplus Insulin Syringe 31G X 5/16" 1 ML Misc Generic drug: Insulin Syringe-Needle U-100 USE AS DIRECTED   venlafaxine XR 37.5 MG 24 hr capsule Commonly known as: EFFEXOR-XR Take 1 capsule (37.5 mg total) by mouth daily.   vitamin C 500 MG tablet Commonly known as: ASCORBIC ACID  Take 500 mg by mouth daily.        Allergies:  Allergies  Allergen Reactions   Gabapentin Other (See Comments)    Increased blood sugar    Family History: Family History  Problem Relation Age of Onset   Hypertension Father    Diabetes Father    Heart disease Father    Heart disease Maternal Grandfather    Heart attack Other    Heart disease Brother        both brothers have heart disease   Hypertension Sister     Social History:  reports that he has been smoking cigarettes. He has a 35.00 pack-year smoking history. He has never used smokeless tobacco. He reports that he does not drink alcohol and does not use drugs.  ROS: All other review of systems were reviewed and are negative except what is noted above in HPI  Physical Exam: BP (!) 81/53   Pulse (!) 125   Constitutional:  Alert and oriented, No acute distress. HEENT: [place] AT, moist mucus membranes.  Trachea midline, no masses. Cardiovascular: No clubbing, cyanosis, or edema. Respiratory: Normal respiratory effort, no increased work of breathing. GI: Abdomen is soft, nontender, nondistended, no abdominal masses GU: No CVA tenderness.  Lymph: No cervical or inguinal lymphadenopathy. Skin: No rashes, bruises or suspicious lesions. Neurologic: Grossly intact, no focal deficits, moving all 4 extremities. Psychiatric: Normal mood  and affect.  Laboratory Data: Lab Results  Component Value Date   WBC 7.5 10/01/2021   HGB 12.9 (L) 10/01/2021   HCT 37.8 10/01/2021   MCV 93 10/01/2021   PLT 238 10/01/2021    Lab Results  Component Value Date   CREATININE 1.36 (H) 10/01/2021    No results found for: "PSA"  No results found for: "TESTOSTERONE"  Lab Results  Component Value Date   HGBA1C 7.4 (H) 10/01/2021    Urinalysis    Component Value Date/Time   COLORURINE YELLOW 07/21/2020 2356   APPEARANCEUR Clear 06/25/2021 1543   LABSPEC 1.020 07/21/2020 2356   PHURINE 6.0 07/21/2020 2356   GLUCOSEU Negative 06/25/2021 1543   HGBUR SMALL (A) 07/21/2020 2356   BILIRUBINUR Negative 06/25/2021 1543   KETONESUR >80 (A) 07/21/2020 2356   PROTEINUR Negative 06/25/2021 1543   PROTEINUR NEGATIVE 07/21/2020 2356   UROBILINOGEN 0.2 12/21/2014 0845   NITRITE Negative 06/25/2021 1543   NITRITE NEGATIVE 07/21/2020 2356   LEUKOCYTESUR Negative 06/25/2021 1543   LEUKOCYTESUR NEGATIVE 07/21/2020 2356    Lab Results  Component Value Date   LABMICR Comment 06/25/2021   WBCUA None seen 07/25/2020   LABEPIT None seen 07/25/2020   MUCUS Present 07/25/2020   BACTERIA Few 07/25/2020    Pertinent Imaging: KUB today: Images reviewed and discussed with the patient  Results for orders placed during the hospital encounter of 12/26/21  Abdomen 1 view (KUB)  Narrative CLINICAL DATA:  Follow-up kidney stone.  EXAM: ABDOMEN - 1 VIEW  COMPARISON:  June 25, 2021  FINDINGS: The bowel gas pattern is normal. 7 mm stone is persistent in the lower pole right kidney. There is a question 2 mm stone in the lower pole left kidney. Stable pelvic phleboliths are unchanged.  IMPRESSION: 7 mm stone is persistent in the lower pole right kidney. There is a question 2 mm stone in the lower pole left kidney.   Electronically Signed By: Abelardo Diesel M.D. On: 12/27/2021 09:52  No results found for this or any previous  visit.  No results found for this  or any previous visit.  No results found for this or any previous visit.  Results for orders placed during the hospital encounter of 12/18/20  Ultrasound renal complete  Narrative CLINICAL DATA:  Nephrolithiasis.  EXAM: RENAL / URINARY TRACT ULTRASOUND COMPLETE  COMPARISON:  September 14, 2020.  FINDINGS: Right Kidney:  Renal measurements: 11.9 x 6.0 x 6.3 cm = volume: 239 mL. Probable 12 mm nonobstructive calculus seen in midpole collecting system. Echogenicity within normal limits. No mass or hydronephrosis visualized.  Left Kidney:  Renal measurements: 13.4 x 5.7 x 5.6 cm = volume: 224 mL. Probable nonobstructive 7 mm calculus seen in midpole collecting system. Echogenicity within normal limits. No mass or hydronephrosis visualized.  Bladder:  Appears normal for degree of bladder distention. Bilateral ureteral jets are noted.  Other:  None.  IMPRESSION: Probable bilateral nonobstructive nephrolithiasis. No hydronephrosis or renal obstruction is noted.   Electronically Signed By: Marijo Conception M.D. On: 12/18/2020 13:39  No results found for this or any previous visit.  No results found for this or any previous visit.  Results for orders placed during the hospital encounter of 09/14/20  CT RENAL STONE STUDY  Narrative CLINICAL DATA:  LEFT flank pain, history kidney stones, follow-up LEFT kidney stone post lithotripsy  EXAM: CT ABDOMEN AND PELVIS WITHOUT CONTRAST  TECHNIQUE: Multidetector CT imaging of the abdomen and pelvis was performed following the standard protocol without IV contrast. Sagittal and coronal MPR images reconstructed from axial data set. No oral contrast administered.  COMPARISON:  07/22/2020  FINDINGS: Lower chest: Calcified granulomata at lung bases. Minimal pericardial fluid, nonspecific.  Hepatobiliary: Gallbladder and liver normal appearance  Pancreas: Normal appearance  Spleen:  Normal appearance  Adrenals/Urinary Tract: LEFT adrenal thickening without mass. RIGHT adrenal mass 4.3 x 3.9 cm image 24, containing fat, consistent with myelolipoma unchanged. BILATERAL nonobstructing renal calculi. Minimal per nonspecific perinephric stranding, chronic. No hydronephrosis, hydroureter, or ureteral calcification. Bladder unremarkable. Previously identified LEFT UPJ calculus is no longer seen.  Stomach/Bowel: Normal appendix. Food debris in stomach. Bowel loops unremarkable.  Vascular/Lymphatic: Few pelvic phleboliths. Atherosclerotic calcification aorta and iliac arteries without aneurysm. No adenopathy.  Reproductive: Mild prostatic enlargement  Other: No free air or free fluid. Tiny umbilical and LEFT inguinal hernias containing fat.  Musculoskeletal: No acute osseous findings  IMPRESSION: BILATERAL nonobstructing renal calculi.  Previously identified LEFT UPJ calculus no longer S seen.  Stable RIGHT adrenal myelolipoma.  Tiny umbilical and LEFT inguinal hernias containing fat.  No acute intra-abdominal or intrapelvic abnormalities.  Aortic Atherosclerosis (ICD10-I70.0).   Electronically Signed By: Lavonia Dana M.D. On: 09/14/2020 09:43   Assessment & Plan:    1. Nephrolithiasis Continue urocitK 34mq BID, sodium bicarb 6520mBID. RTC 1 year with KUB   No follow-ups on file.  PaNicolette BangMD  [facility] [place]

## 2022-01-01 NOTE — Assessment & Plan Note (Signed)
Stable.  Advised smoking cessation.

## 2022-01-01 NOTE — Assessment & Plan Note (Signed)
Patient states that his glycemic control is worsening.  A1c today.  Continue current medications for now.  We will make adjustments based on A1c.

## 2022-01-01 NOTE — Assessment & Plan Note (Signed)
Advised that he needs to quit smoking.  Discussed CT lung cancer screening.  We will revisit at follow-up.

## 2022-01-01 NOTE — Patient Instructions (Signed)
Dietary Guidelines to Help Prevent Kidney Stones Kidney stones are deposits of minerals and salts that form inside your kidneys. Your risk of developing kidney stones may be greater depending on your diet, your lifestyle, the medicines you take, and whether you have certain medical conditions. Most people can lower their chances of developing kidney stones by following the instructions below. Your dietitian may give you more specific instructions depending on your overall health and the type of kidney stones you tend to develop. What are tips for following this plan? Reading food labels  Choose foods with "no salt added" or "low-salt" labels. Limit your salt (sodium) intake to less than 1,500 mg a day. Choose foods with calcium for each meal and snack. Try to eat about 300 mg of calcium at each meal. Foods that contain 200-500 mg of calcium a serving include: 8 oz (237 mL) of milk, calcium-fortifiednon-dairy milk, and calcium-fortifiedfruit juice. Calcium-fortified means that calcium has been added to these drinks. 8 oz (237 mL) of kefir, yogurt, and soy yogurt. 4 oz (114 g) of tofu. 1 oz (28 g) of cheese. 1 cup (150 g) of dried figs. 1 cup (91 g) of cooked broccoli. One 3 oz (85 g) can of sardines or mackerel. Most people need 1,000-1,500 mg of calcium a day. Talk to your dietitian about how much calcium is recommended for you. Shopping Buy plenty of fresh fruits and vegetables. Most people do not need to avoid fruits and vegetables, even if these foods contain nutrients that may contribute to kidney stones. When shopping for convenience foods, choose: Whole pieces of fruit. Pre-made salads with dressing on the side. Low-fat fruit and yogurt smoothies. Avoid buying frozen meals or prepared deli foods. These can be high in sodium. Look for foods with live cultures, such as yogurt and kefir. Choose high-fiber grains, such as whole-wheat breads, oat bran, and wheat cereals. Cooking Do not add  salt to food when cooking. Place a salt shaker on the table and allow each person to add his or her own salt to taste. Use vegetable protein, such as beans, textured vegetable protein (TVP), or tofu, instead of meat in pasta, casseroles, and soups. Meal planning Eat less salt, if told by your dietitian. To do this: Avoid eating processed or pre-made food. Avoid eating fast food. Eat less animal protein, including cheese, meat, poultry, or fish, if told by your dietitian. To do this: Limit the number of times you have meat, poultry, fish, or cheese each week. Eat a diet free of meat at least 2 days a week. Eat only one serving each day of meat, poultry, fish, or seafood. When you prepare animal protein, cut pieces into small portion sizes. For most meat and fish, one serving is about the size of the palm of your hand. Eat at least five servings of fresh fruits and vegetables each day. To do this: Keep fruits and vegetables on hand for snacks. Eat one piece of fruit or a handful of berries with breakfast. Have a salad and fruit at lunch. Have two kinds of vegetables at dinner. Limit foods that are high in a substance called oxalate. These include: Spinach (cooked), rhubarb, beets, sweet potatoes, and Swiss chard. Peanuts. Potato chips, french fries, and baked potatoes with skin on. Nuts and nut products. Chocolate. If you regularly take a diuretic medicine, make sure to eat at least 1 or 2 servings of fruits or vegetables that are high in potassium each day. These include: Avocado. Banana. Orange, prune,   carrot, or tomato juice. Baked potato. Cabbage. Beans and split peas. Lifestyle  Drink enough fluid to keep your urine pale yellow. This is the most important thing you can do. Spread your fluid intake throughout the day. If you drink alcohol: Limit how much you use to: 0-1 drink a day for women who are not pregnant. 0-2 drinks a day for men. Be aware of how much alcohol is in your  drink. In the [place] one drink equals one 12 oz bottle of beer (355 mL), one 5 oz glass of wine (148 mL), or one 1 oz glass of hard liquor (44 mL). Lose weight if told by your health care provider. Work with your dietitian to find an eating plan and weight loss strategies that work best for you. General information Talk to your health care provider and dietitian about taking daily supplements. You may be told the following depending on your health and the cause of your kidney stones: Not to take supplements with vitamin C. To take a calcium supplement. To take a daily probiotic supplement. To take other supplements such as magnesium, fish oil, or vitamin B6. Take over-the-counter and prescription medicines only as told by your health care provider. These include supplements. What foods should I limit? Limit your intake of the following foods, or eat them as told by your dietitian. Vegetables Spinach. Rhubarb. Beets. Canned vegetables. Pickles. Olives. Baked potatoes with skin. Grains Wheat bran. Baked goods. Salted crackers. Cereals high in sugar. Meats and other proteins Nuts. Nut butters. Large portions of meat, poultry, or fish. Salted, precooked, or cured meats, such as sausages, meat loaves, and hot dogs. Dairy Cheese. Beverages Regular soft drinks. Regular vegetable juice. Seasonings and condiments Seasoning blends with salt. Salad dressings. Soy sauce. Ketchup. Barbecue sauce. Other foods Canned soups. Canned pasta sauce. Casseroles. Pizza. Lasagna. Frozen meals. Potato chips. French fries. The items listed above may not be a complete list of foods and beverages you should limit. Contact a dietitian for more information. What foods should I avoid? Talk to your dietitian about specific foods you should avoid based on the type of kidney stones you have and your overall health. Fruits Grapefruit. The item listed above may not be a complete list of foods and beverages you should  avoid. Contact a dietitian for more information. Summary Kidney stones are deposits of minerals and salts that form inside your kidneys. You can lower your risk of kidney stones by making changes to your diet. The most important thing you can do is drink enough fluid. Drink enough fluid to keep your urine pale yellow. Talk to your dietitian about how much calcium you should have each day, and eat less salt and animal protein as told by your dietitian. This information is not intended to replace advice given to you by your health care provider. Make sure you discuss any questions you have with your health care provider. Document Revised: 03/12/2021 Document Reviewed: 03/12/2021 Elsevier Patient Education  2023 Elsevier Inc.  

## 2022-01-02 LAB — LIPID PANEL
Chol/HDL Ratio: 3.2 ratio (ref 0.0–5.0)
Cholesterol, Total: 142 mg/dL (ref 100–199)
HDL: 45 mg/dL (ref >39)
LDL Chol Calc (NIH): 75 mg/dL (ref 0–99)
Triglycerides: 122 mg/dL (ref 0–149)
VLDL Cholesterol Cal: 22 mg/dL (ref 5–40)

## 2022-01-02 LAB — HEMOGLOBIN A1C
Est. average glucose Bld gHb Est-mCnc: 209 mg/dL
Hgb A1c MFr Bld: 8.9 % — ABNORMAL HIGH (ref 4.8–5.6)

## 2022-01-06 IMAGING — CT CT ABD-PELV W/ CM
2 of 5 series · 16 of 46 positions shown, 18 images · IV contrast (omnipaque)
Comparison: None available.

CLINICAL DATA: Initial evaluation for acute nonlocalized abdominal
pain.

EXAM:
CT ABDOMEN AND PELVIS WITH CONTRAST
TECHNIQUE: Multidetector CT imaging of the abdomen and pelvis was performed
using the standard protocol following bolus administration of
intravenous contrast.
CONTRAST:  100mL OMNIPAQUE IOHEXOL 300 MG/ML  SOLN

[Series 2: axial st · axial · 0.98mm/px · z∈[-872,-442]mm · 13 of 98 slices shown, 15 images]
[im 6/98  soft-tissue]
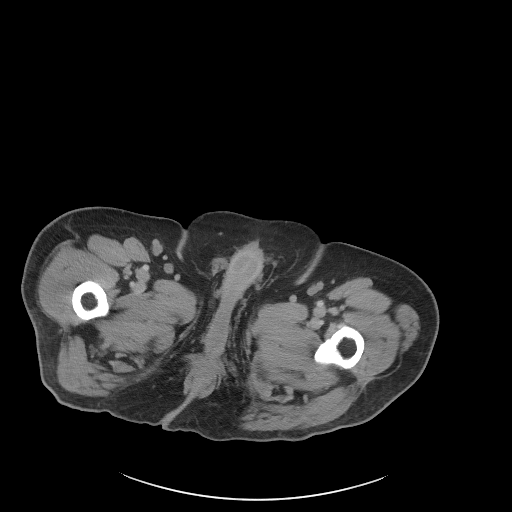
[im 6/98  bone]
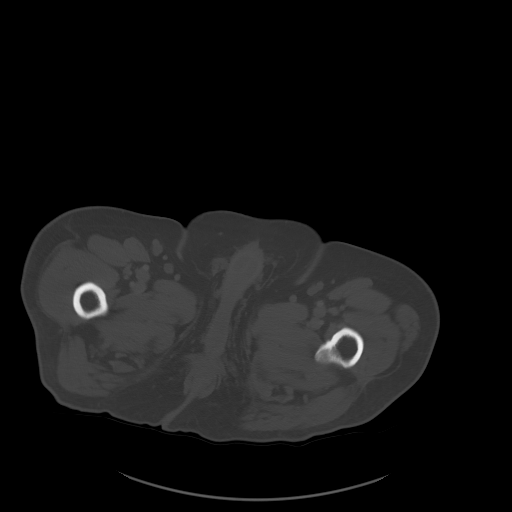
[im 11/98  soft-tissue]
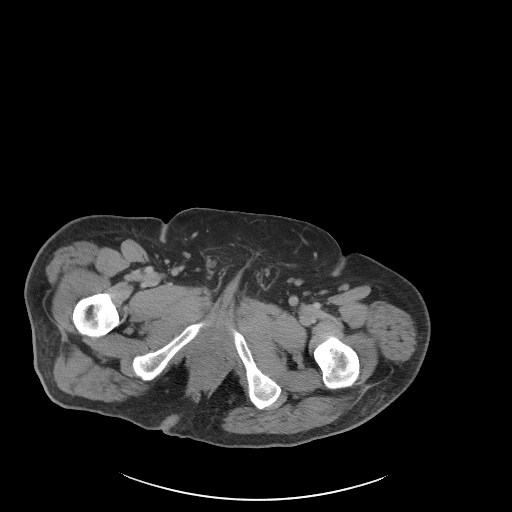
[im 22/98  soft-tissue]
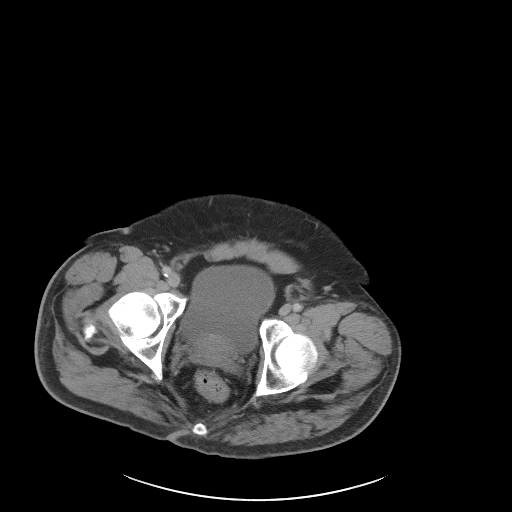
[im 27/98  soft-tissue]
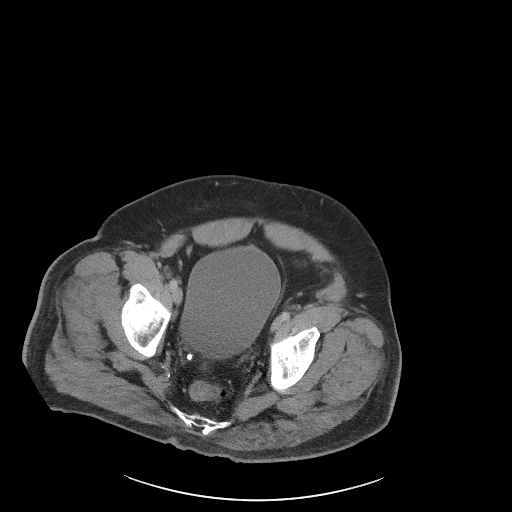
[im 33/98  soft-tissue]
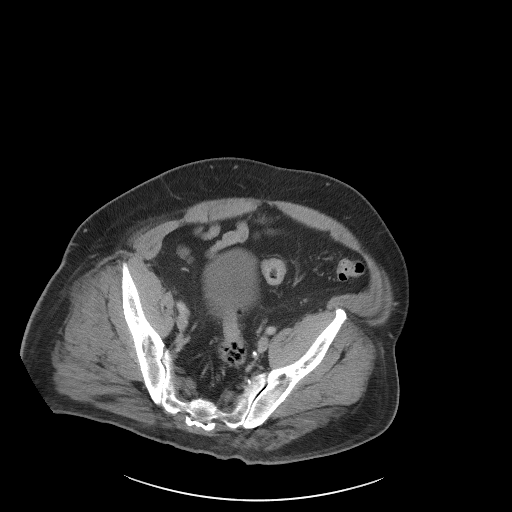
[im 44/98  soft-tissue]
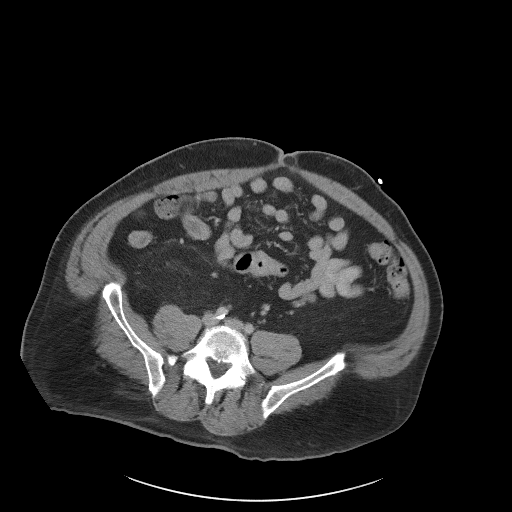
[im 49/98  soft-tissue]
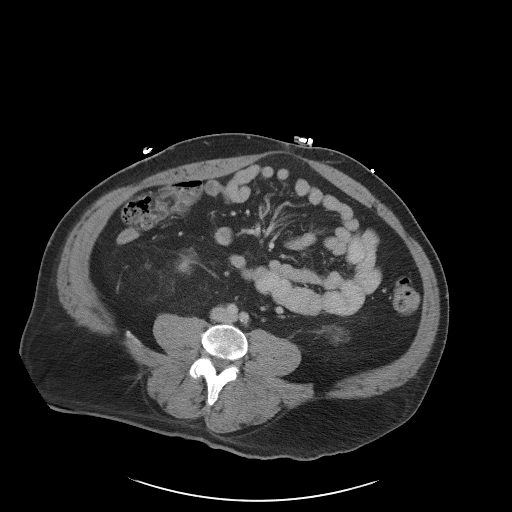
[im 54/98  soft-tissue]
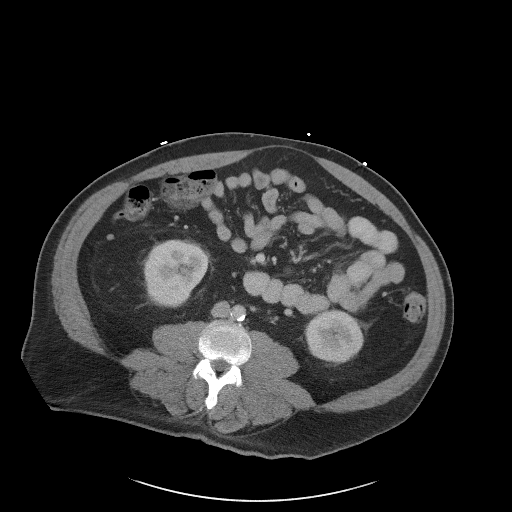
[im 65/98  soft-tissue]
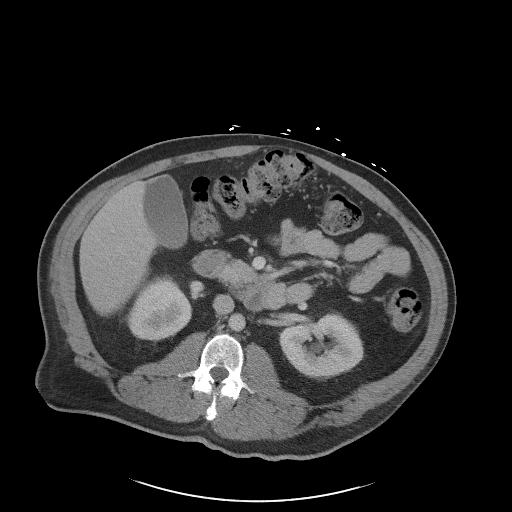
[im 65/98  bone]
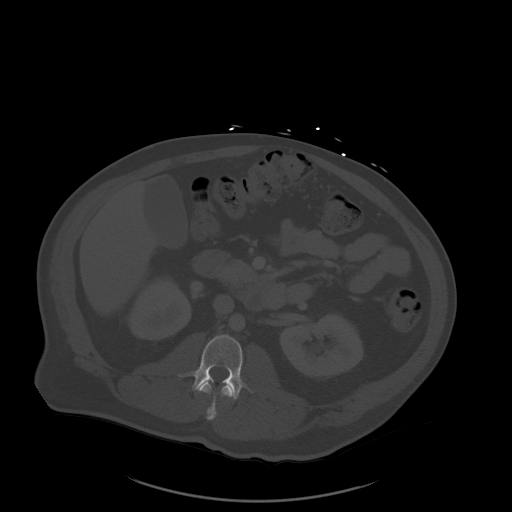
[im 71/98  soft-tissue]
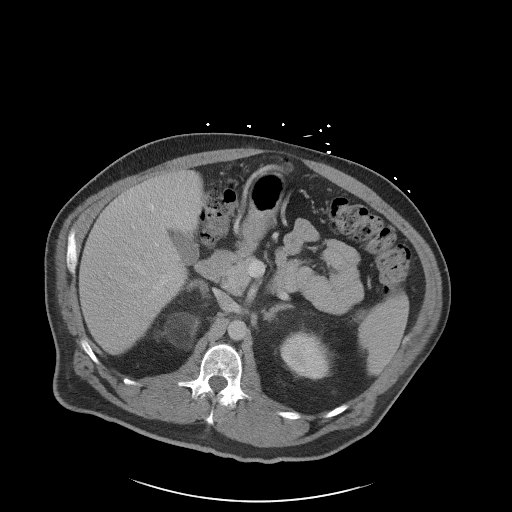
[im 76/98  soft-tissue]
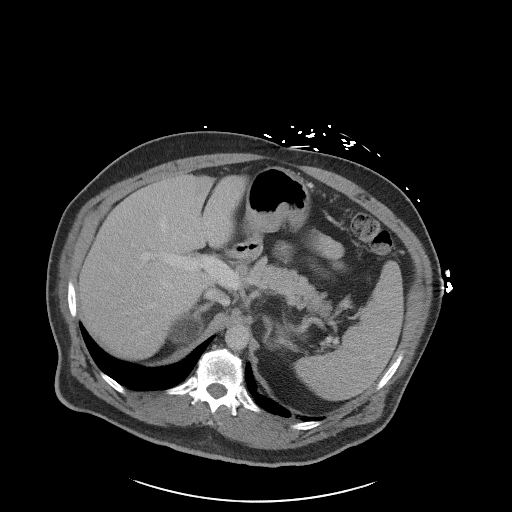
[im 87/98  soft-tissue]
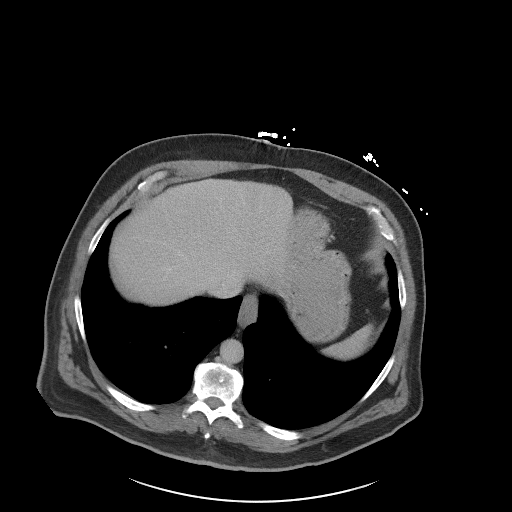
[im 92/98  soft-tissue]
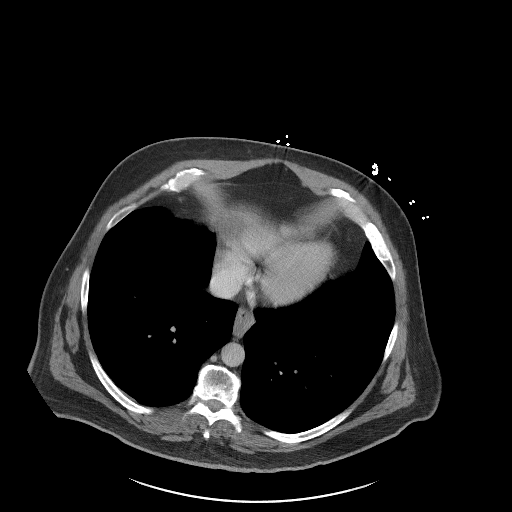

[Series 5: coronal st · coronal · 0.93mm/px · 3 of 126 slices shown]
[im 42/126  soft-tissue]
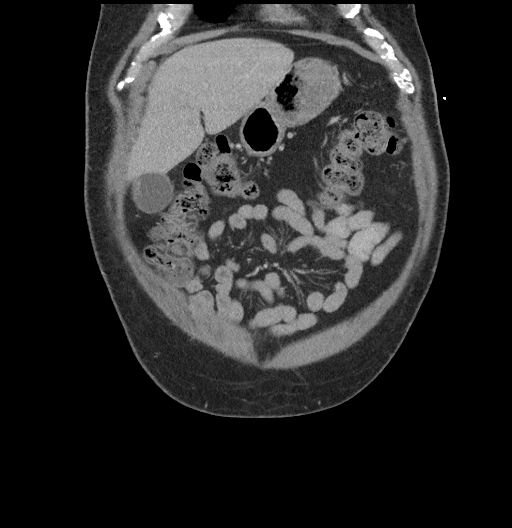
[im 56/126  soft-tissue]
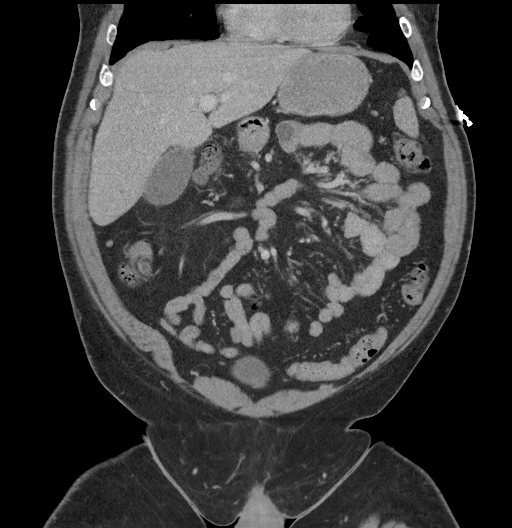
[im 70/126  soft-tissue]
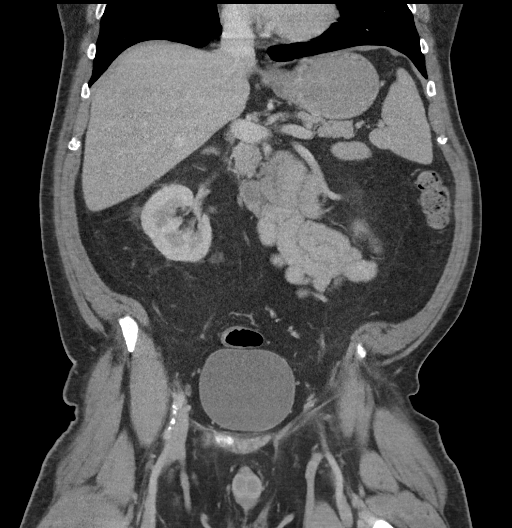

[16 of 46 positions shown; findings below may reference images not displayed]

FINDINGS: Lower chest: Visualized lung bases are clear.

Hepatobiliary: Liver demonstrates a normal contrast enhanced
appearance. Focal fat deposition noted adjacent the falciform
ligament. Trace hyperdensity at the gallbladder fundus could reflect
stones and/or sludge possibly small gallbladder polyps (series 2,
image 35). No evidence for acute cholecystitis. No biliary
dilatation.

Pancreas: Pancreas within normal limits.

Spleen: Spleen mildly enlarged measuring up to 15.4 cm in AP
diameter.

Adrenals/Urinary Tract: 4.7 cm fat density mass arising from the
right adrenal gland consistent with a myelolipoma. Minimal
thickening of the left adrenal gland without discrete nodule or
mass. Kidneys fairly equal in size with symmetric enhancement.
Multiple nonobstructive calculi present within the right kidney,
largest of which measures 8 mm at the lower pole. On the left, there
is a 4 mm stone at the left UPJ with secondary mild fullness of the
left renal collecting system without overt hydronephrosis (series 2,
image 41). Additional 4 mm nonobstructive calculus present at the
lower pole the left kidney. No other radiopaque calculi seen along
the course of either renal collecting system. No hydroureter. No
focal enhancing renal mass. Partially distended bladder within
normal limits.

Stomach/Bowel: Stomach within normal limits. No evidence for bowel
obstruction. Normal appendix. No acute inflammatory changes seen
about the bowels.

Vascular/Lymphatic: Aorto bi-iliac atherosclerotic disease without
aneurysm. Mesenteric vessels patent proximally. No adenopathy.

Reproductive: Prostate mildly enlarged and nodular in appearance
invaginating upon the base of the bladder. Prostate measures up to
4.8 cm in transverse diameter.

Other: No free air or fluid.

Musculoskeletal: Minimal compression deformity at the superior
endplate of T12 is chronic in appearance. No acute osseous finding.
No discrete or worrisome osseous lesions.
IMPRESSION: 1. 4 mm stone at the left UPJ with secondary mild fullness of the
left renal collecting system without overt hydronephrosis.
2. Additional bilateral nonobstructive nephrolithiasis as above.
3. No other acute intra-abdominal or pelvic process.
4. Prostate mildly enlarged and nodular in appearance invaginating
upon the base of the bladder.
5. 4.7 cm right adrenal myelolipoma.
6. Mild splenomegaly.
7. Aortic Atherosclerosis ([id number]-X90.0).

## 2022-01-09 IMAGING — DX DG ABDOMEN 1V
2 series · 2 of 2 positions shown · non-contrast
Comparison: CT 07/22/2020

CLINICAL DATA: Nephrolithiasis, lower abdominal pain

EXAM:
ABDOMEN - 1 VIEW

[abdomen kub (1 of 2)]
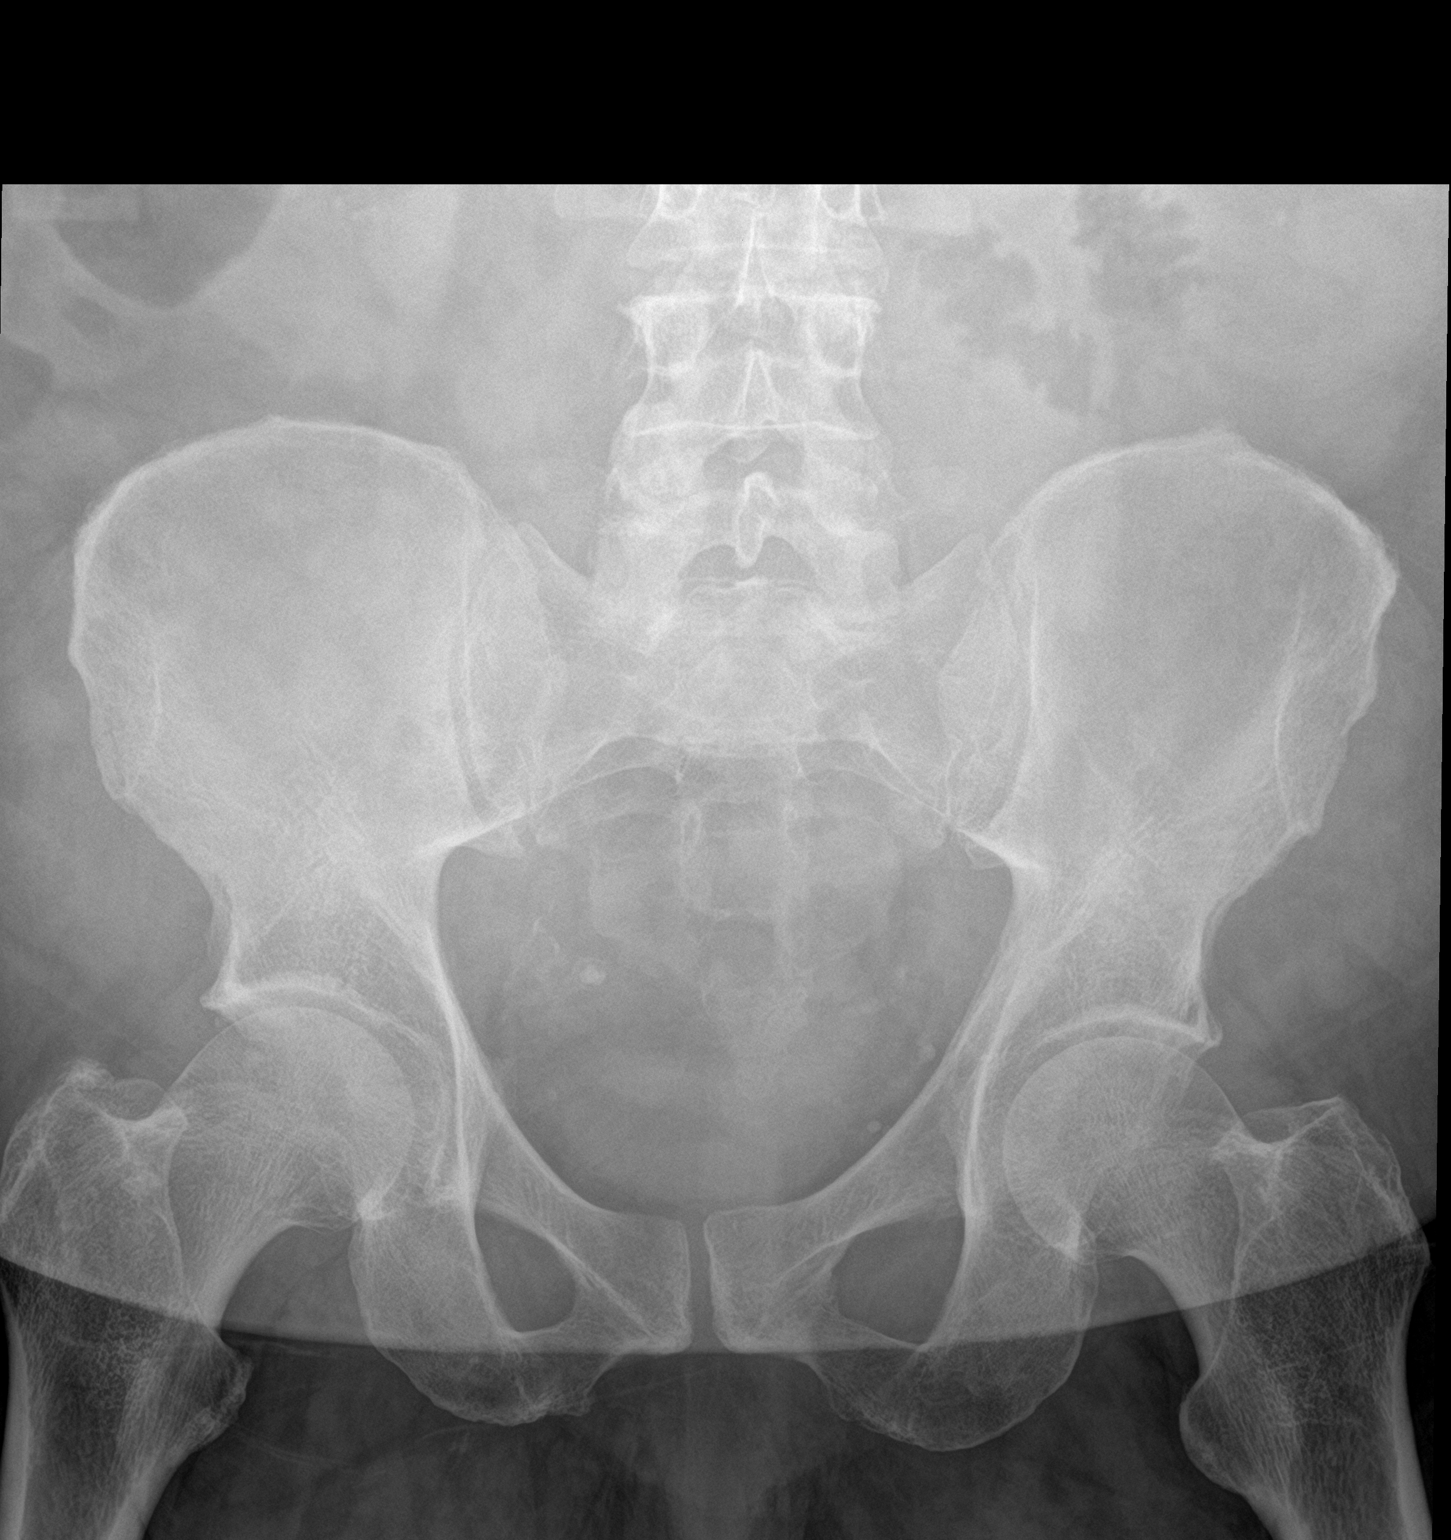

[abdomen kub (2 of 2)]
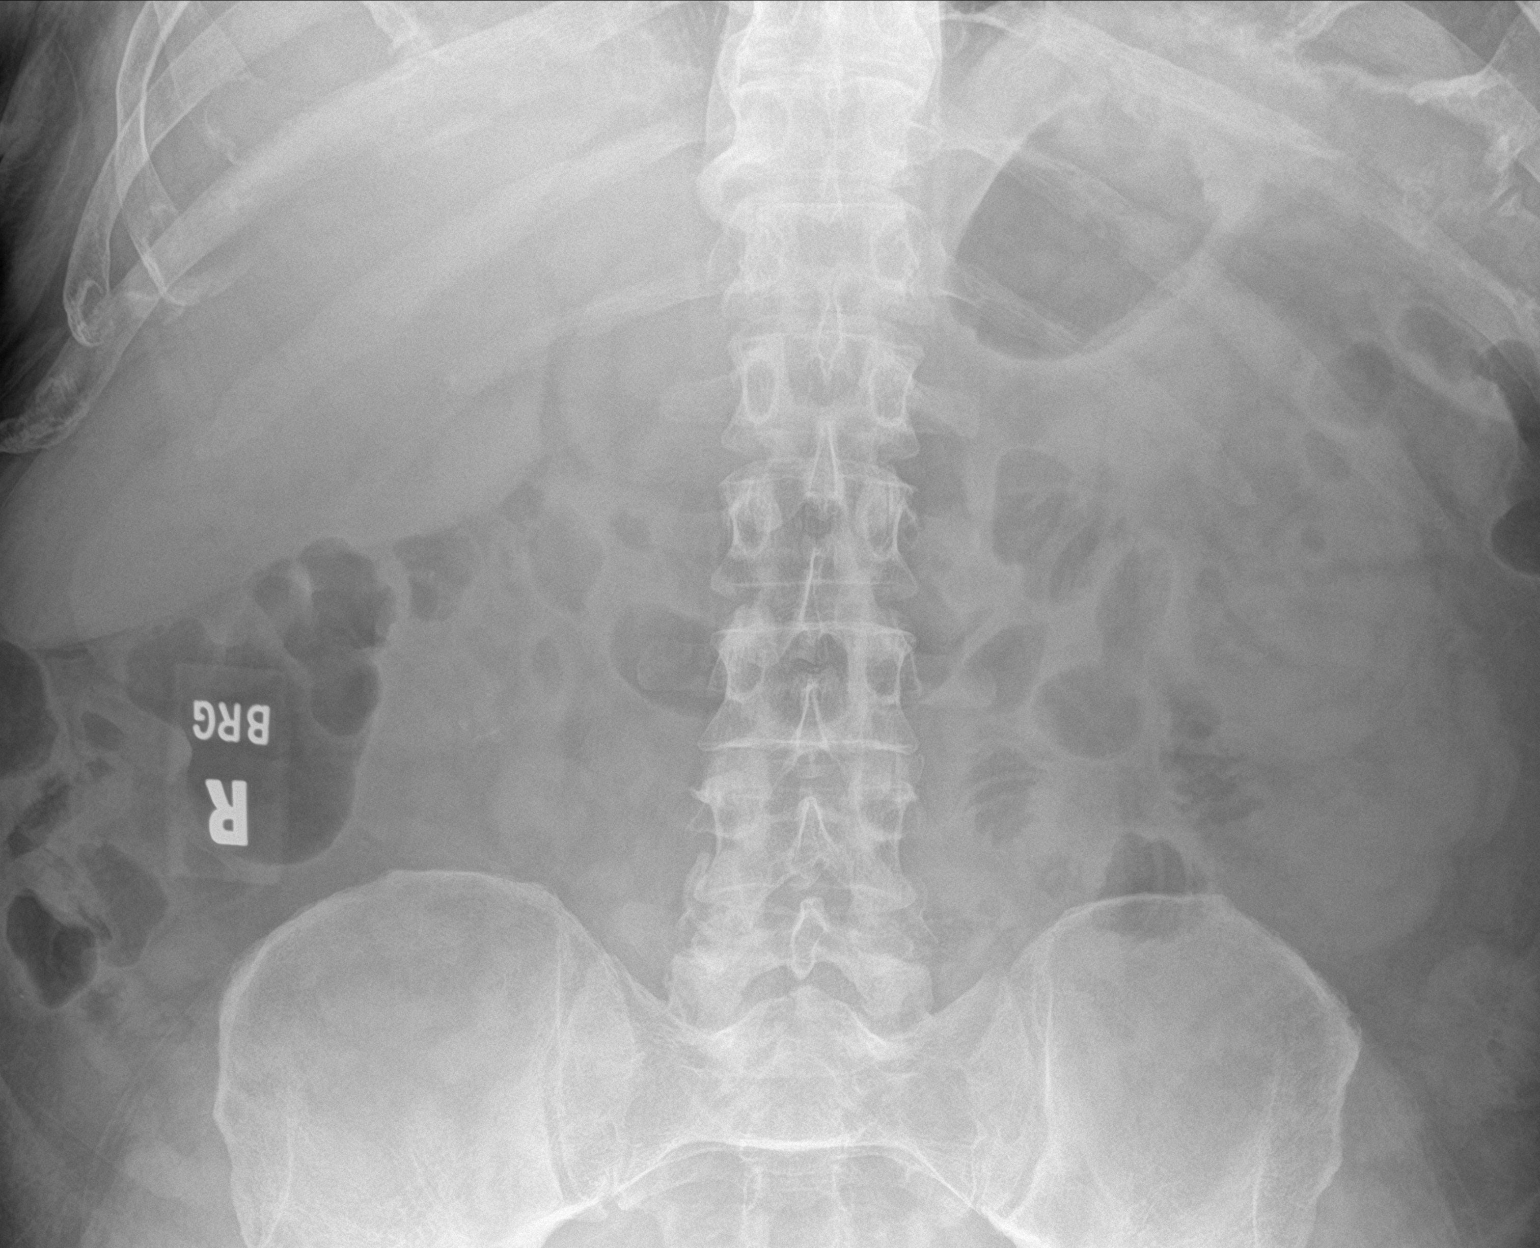

[2 of 2 positions shown; findings below may reference images not displayed]

FINDINGS: A previously seen calculus at the left ureterovesicular junction is
not as well visualized on today's examination. Few coarse
calcifications project over the renal shadows otherwise, largest in
the right lower pole. No additional calcifications are seen along
the course of the ureters. Few radiodensities in the pelvis appear
best correspond to a combination of phleboliths and vascular calcium
on the comparison CT. No visible bladder calculus is evident. Normal
bowel gas pattern. Normal bone mineralization. No acute or worrisome
osseous lesions. Degenerative changes in the spine, hips and pelvis.
IMPRESSION: 1. A previously seen calculus at the left ureteropelvic junction is
not as well visualized on today's examination nor along the distal
urinary tract.
2. Few coarse calcifications project over the renal shadows
otherwise, largest in the right lower pole.

## 2022-01-17 ENCOUNTER — Other Ambulatory Visit: Payer: Self-pay | Admitting: Family Medicine

## 2022-01-17 DIAGNOSIS — E785 Hyperlipidemia, unspecified: Secondary | ICD-10-CM

## 2022-01-23 IMAGING — DX DG ABDOMEN 1V
2 series · 2 of 2 positions shown · non-contrast
Comparison: July 25, 2020

CLINICAL DATA: History of lithotripsy, kidney stone

EXAM:
ABDOMEN - 1 VIEW

[abdomen kub (1 of 2)]
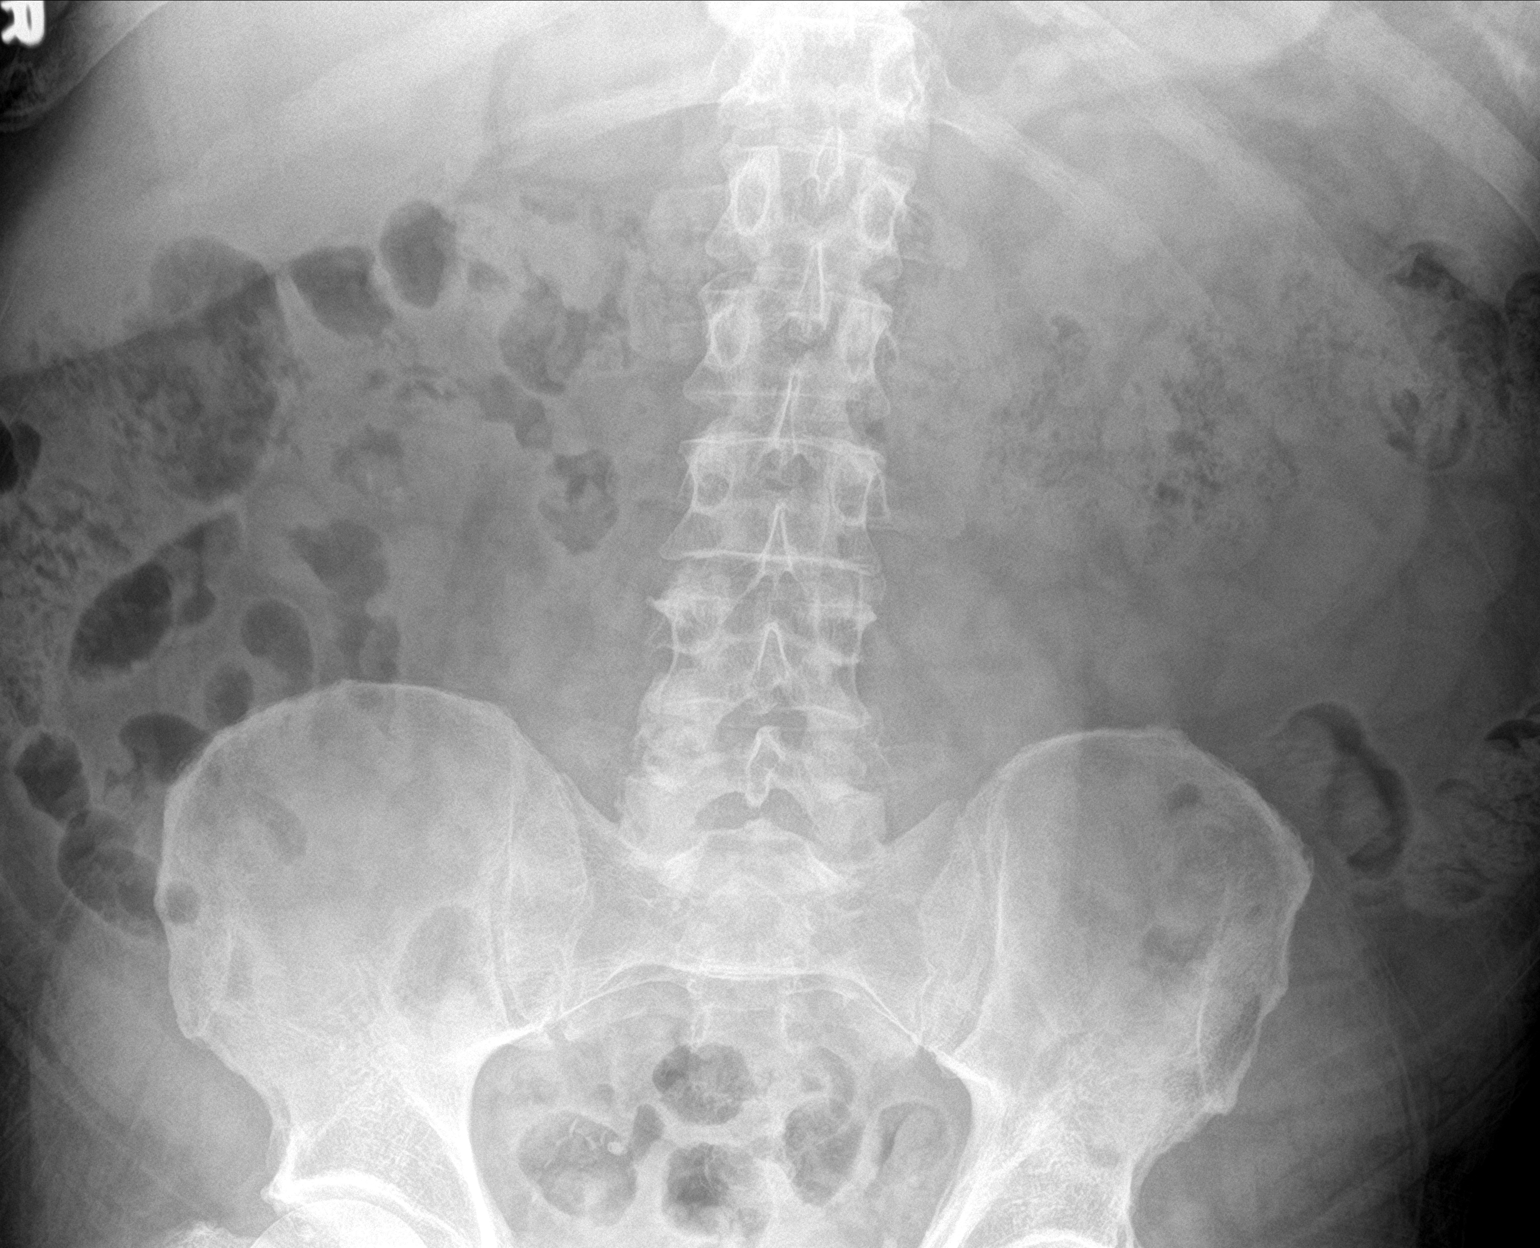

[abdomen kub (2 of 2)]
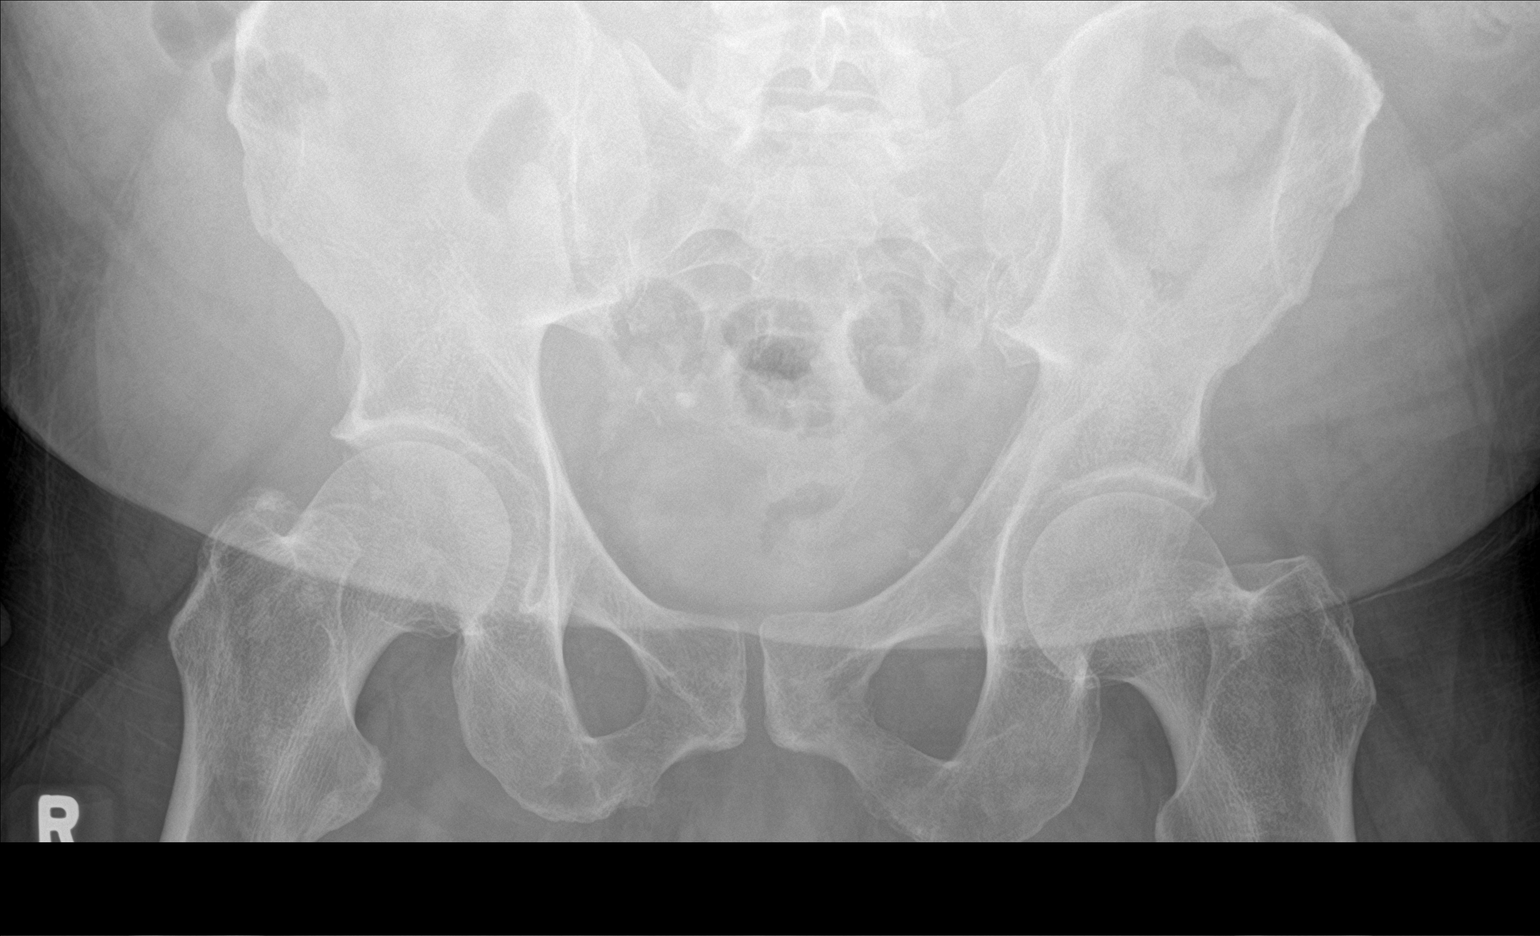

[2 of 2 positions shown; findings below may reference images not displayed]

FINDINGS: No visible calcification over the LEFT renal contour or course of
LEFT ureter. Subtle accentuation of LEFT renal contour as compared
to the previous exam, significance uncertain.

Small calcific densities in the area of the lower pole of the RIGHT
kidney faintly visualized and partially obscured by overlying bowel
gas.

Bowel gas pattern is unremarkable.

No acute skeletal process on limited assessment. Soft tissues are
unremarkable.
IMPRESSION: 1. No visible calcification over the LEFT renal contour or course of
LEFT ureter.
2. Subtle accentuation of LEFT renal contour as compared to the
previous exam may simply be projectional. Correlate with any
LEFT-sided symptoms following lithotripsy to exclude the possibility
of small adjacent hematoma that could accentuate the renal contour.
If there is concern CT could be helpful for further evaluation as
clinically warranted.
3. Small RIGHT lower pole renal calculi, seen on kidney ultrasound.

## 2022-01-29 ENCOUNTER — Other Ambulatory Visit: Payer: Self-pay | Admitting: Family Medicine

## 2022-02-27 ENCOUNTER — Other Ambulatory Visit: Payer: Self-pay

## 2022-02-27 DIAGNOSIS — E785 Hyperlipidemia, unspecified: Secondary | ICD-10-CM

## 2022-02-27 DIAGNOSIS — E1165 Type 2 diabetes mellitus with hyperglycemia: Secondary | ICD-10-CM

## 2022-02-27 DIAGNOSIS — N183 Chronic kidney disease, stage 3 unspecified: Secondary | ICD-10-CM

## 2022-02-27 DIAGNOSIS — N200 Calculus of kidney: Secondary | ICD-10-CM

## 2022-02-27 DIAGNOSIS — J449 Chronic obstructive pulmonary disease, unspecified: Secondary | ICD-10-CM

## 2022-02-27 MED ORDER — "INSULIN SYRINGE-NEEDLE U-100 31G X 5/16"" 1 ML MISC": 0 refills | Status: DC

## 2022-02-27 MED ORDER — OZEMPIC (1 MG/DOSE) 4 MG/3ML ~~LOC~~ SOPN: PEN_INJECTOR | SUBCUTANEOUS | 0 refills | Status: DC

## 2022-02-27 MED ORDER — TAMSULOSIN HCL 0.4 MG PO CAPS: 0.4000 mg | ORAL_CAPSULE | Freq: Every day | ORAL | 3 refills | Status: DC

## 2022-02-27 MED ORDER — FUROSEMIDE 20 MG PO TABS: 20.0000 mg | ORAL_TABLET | Freq: Every day | ORAL | 1 refills | Status: DC

## 2022-02-27 MED ORDER — ALBUTEROL SULFATE HFA 108 (90 BASE) MCG/ACT IN AERS: INHALATION_SPRAY | RESPIRATORY_TRACT | 5 refills | Status: DC

## 2022-02-27 MED ORDER — TERAZOSIN HCL 10 MG PO CAPS: ORAL_CAPSULE | ORAL | 0 refills | Status: DC

## 2022-02-27 MED ORDER — INSULIN ASPART 100 UNIT/ML IJ SOLN: INTRAMUSCULAR | 3 refills | Status: DC

## 2022-02-27 MED ORDER — BUPROPION HCL ER (SR) 150 MG PO TB12: 150.0000 mg | ORAL_TABLET | Freq: Two times a day (BID) | ORAL | 1 refills | Status: DC

## 2022-02-27 MED ORDER — ENALAPRIL MALEATE 20 MG PO TABS: 20.0000 mg | ORAL_TABLET | Freq: Two times a day (BID) | ORAL | 1 refills | Status: DC

## 2022-02-27 MED ORDER — FLUTICASONE-SALMETEROL 250-50 MCG/ACT IN AEPB: INHALATION_SPRAY | RESPIRATORY_TRACT | 1 refills | Status: DC

## 2022-02-27 MED ORDER — INSULIN DETEMIR 100 UNIT/ML ~~LOC~~ SOLN: 40.0000 [IU] | Freq: Two times a day (BID) | SUBCUTANEOUS | 3 refills | Status: DC

## 2022-02-27 MED ORDER — ATORVASTATIN CALCIUM 20 MG PO TABS: 20.0000 mg | ORAL_TABLET | Freq: Every day | ORAL | 0 refills | Status: DC

## 2022-02-27 MED ORDER — MESALAMINE 400 MG PO CPDR: DELAYED_RELEASE_CAPSULE | ORAL | 1 refills | Status: DC

## 2022-03-01 ENCOUNTER — Telehealth: Payer: Self-pay | Admitting: Family Medicine

## 2022-03-01 NOTE — Telephone Encounter (Signed)
PA completed for both Insulin Aspart and Ozempic. Insulin Aspart approved from 07/15/21-02/28/23; Ozempic approved from 07/15/21-07/14/22

## 2022-04-03 ENCOUNTER — Ambulatory Visit (INDEPENDENT_AMBULATORY_CARE_PROVIDER_SITE_OTHER): Payer: Medicare Other | Admitting: Family Medicine

## 2022-04-03 DIAGNOSIS — Z794 Long term (current) use of insulin: Secondary | ICD-10-CM | POA: Diagnosis not present

## 2022-04-03 DIAGNOSIS — N183 Chronic kidney disease, stage 3 unspecified: Secondary | ICD-10-CM

## 2022-04-03 DIAGNOSIS — E1122 Type 2 diabetes mellitus with diabetic chronic kidney disease: Secondary | ICD-10-CM

## 2022-04-03 MED ORDER — SEMAGLUTIDE (2 MG/DOSE) 8 MG/3ML ~~LOC~~ SOPN: 2.0000 mg | PEN_INJECTOR | SUBCUTANEOUS | 3 refills | Status: DC

## 2022-04-03 MED ORDER — INSULIN DETEMIR 100 UNIT/ML ~~LOC~~ SOLN: 60.0000 [IU] | Freq: Two times a day (BID) | SUBCUTANEOUS | 3 refills | Status: DC

## 2022-04-03 MED ORDER — INSULIN ASPART 100 UNIT/ML IJ SOLN: 15.0000 [IU] | Freq: Three times a day (TID) | INTRAMUSCULAR | 3 refills | Status: DC

## 2022-04-03 NOTE — Patient Instructions (Signed)
I have increased the NovoLog and the Levemir as we discussed - 15 units with meals and 60 units twice daily.  I have also increased the Ozempic.  Follow up in 3 months.

## 2022-04-04 DIAGNOSIS — G43909 Migraine, unspecified, not intractable, without status migrainosus: Secondary | ICD-10-CM | POA: Insufficient documentation

## 2022-04-04 NOTE — Progress Notes (Signed)
Subjective:  Patient ID: Shaun Harris, male    DOB: 11/01/1965  Age: 56 y.o. MRN: [id number]  CC: Chief Complaint  Patient presents with   3 MONTH FOLLOW UP    HPI:  56 year old male with an extensive past medical history presents for follow-up regarding type 2 diabetes.  Patient reports that his blood sugars are uncontrolled.  Seem to be rising despite recent increase in insulin therapy.  He states that his sugars are in the 270s quite a bit.  Patient wants to wait on A1c as he knows it is going to be uncontrolled.  At his last visit, Levemir was increased to 45 units twice daily.  He endorses compliance.  He takes Ozempic as well, they 1 mg dose.  He is also on bolus insulin up to 12 units with meals.  Metformin has been stopped due to renal function.  Denies hypoglycemia.  Patient Active Problem List   Diagnosis Date Noted   Migraine 04/04/2022   BPH (benign prostatic hyperplasia) 01/01/2022   Ulcerative colitis ([place]) 07/02/2021   Type 2 diabetes mellitus with diabetic chronic kidney disease ([place]) 07/02/2021   History of seizure 03/14/2021   Stage 3 chronic kidney disease ([place]) 02/06/2021   Nephrolithiasis 07/25/2020   COPD (chronic obstructive pulmonary disease) ([place]) 03/10/2013   Tobacco abuse 03/10/2013   Morbid obesity ([place]) 03/10/2013   Essential hypertension 10/01/2012   Hyperlipidemia 10/01/2012   Chronic pain syndrome 10/01/2012    Social Hx   Social History   Socioeconomic History   Marital status: Married    Spouse name: Not on file   Number of children: Not on file   Years of education: Not on file   Highest education level: Not on file  Occupational History   Not on file  Tobacco Use   Smoking status: Every Day    Packs/day: 1.00    Years: 35.00    Total pack years: 35.00    Types: Cigarettes   Smokeless tobacco: Never  Vaping Use   Vaping Use: Never used  Substance and Sexual Activity   Alcohol use: No   Drug use: No   Sexual activity: Yes     Birth control/protection: None  Other Topics Concern   Not on file  Social History Narrative   Not on file   Social Determinants of Health   Financial Resource Strain: Not on file  Food Insecurity: Not on file  Transportation Needs: Not on file  Physical Activity: Not on file  Stress: Not on file  Social Connections: Not on file    Review of Systems Per HPI  Objective:  BP 102/65   Pulse (!) 109   Temp 98 F (36.7 C)   Ht 5' 7"  (1.702 m)   Wt 233 lb (105.7 kg)   SpO2 98%   BMI 36.49 kg/m      04/03/2022   10:17 AM 01/01/2022   10:44 AM 01/01/2022    9:40 AM  BP/Weight  Systolic BP 751 81 025  Diastolic BP 65 53 80  Wt. (Lbs) 233  227  BMI 36.49 kg/m2  35.55 kg/m2    Physical Exam Vitals and nursing note reviewed.  Constitutional:      Appearance: Normal appearance. He is obese.  HENT:     Head: Normocephalic and atraumatic.  Eyes:     General:        Right eye: No discharge.        Left eye: No discharge.  Conjunctiva/sclera: Conjunctivae normal.  Cardiovascular:     Rate and Rhythm: Normal rate and regular rhythm.  Pulmonary:     Effort: Pulmonary effort is normal.     Breath sounds: Normal breath sounds. No wheezing, rhonchi or rales.  Neurological:     Mental Status: He is alert.  Psychiatric:        Mood and Affect: Mood normal.        Behavior: Behavior normal.     Lab Results  Component Value Date   WBC 7.5 10/01/2021   HGB 12.9 (L) 10/01/2021   HCT 37.8 10/01/2021   PLT 238 10/01/2021   GLUCOSE 170 (H) 10/01/2021   CHOL 142 01/01/2022   TRIG 122 01/01/2022   HDL 45 01/01/2022   LDLCALC 75 01/01/2022   ALT 17 10/01/2021   AST 14 10/01/2021   NA 142 10/01/2021   K 5.1 10/01/2021   CL 103 10/01/2021   CREATININE 1.36 (H) 10/01/2021   BUN 20 10/01/2021   CO2 27 10/01/2021   HGBA1C 8.9 (H) 01/01/2022     Assessment & Plan:   Problem List Items Addressed This Visit       Endocrine   Type 2 diabetes mellitus with diabetic  chronic kidney disease ([place])    Glycemic control is worsening. Increasing NovoLog to 15 units with meals.  Increasing Levemir to 60 units twice daily.  Also increasing Ozempic to the 2 mg dose.  Follow-up in 3 months.      Relevant Medications   Semaglutide, 2 MG/DOSE, 8 MG/3ML SOPN   insulin aspart (NOVOLOG) 100 UNIT/ML injection   insulin detemir (LEVEMIR) 100 UNIT/ML injection    Meds ordered this encounter  Medications   Semaglutide, 2 MG/DOSE, 8 MG/3ML SOPN    Sig: Inject 2 mg as directed once a week.    Dispense:  3 mL    Refill:  3   insulin aspart (NOVOLOG) 100 UNIT/ML injection    Sig: Inject 15 Units into the skin 3 (three) times daily with meals.    Dispense:  30 mL    Refill:  3   insulin detemir (LEVEMIR) 100 UNIT/ML injection    Sig: Inject 0.6 mLs (60 Units total) into the skin 2 (two) times daily.    Dispense:  60 mL    Refill:  3    Follow-up: 3 months  [place]

## 2022-04-04 NOTE — Assessment & Plan Note (Signed)
Glycemic control is worsening. Increasing NovoLog to 15 units with meals.  Increasing Levemir to 60 units twice daily.  Also increasing Ozempic to the 2 mg dose.  Follow-up in 3 months.

## 2022-06-04 ENCOUNTER — Other Ambulatory Visit: Payer: Self-pay

## 2022-06-04 DIAGNOSIS — E1165 Type 2 diabetes mellitus with hyperglycemia: Secondary | ICD-10-CM

## 2022-06-04 MED ORDER — "INSULIN SYRINGE-NEEDLE U-100 31G X 5/16"" 1 ML MISC": 0 refills | Status: DC

## 2022-07-03 ENCOUNTER — Ambulatory Visit (INDEPENDENT_AMBULATORY_CARE_PROVIDER_SITE_OTHER): Payer: Medicare Other | Admitting: Family Medicine

## 2022-07-03 ENCOUNTER — Encounter (INDEPENDENT_AMBULATORY_CARE_PROVIDER_SITE_OTHER): Payer: Self-pay | Admitting: *Deleted

## 2022-07-03 VITALS — BP 130/76 | HR 97 | Temp 97.7°F | Wt 234.0 lb

## 2022-07-03 DIAGNOSIS — N183 Chronic kidney disease, stage 3 unspecified: Secondary | ICD-10-CM | POA: Diagnosis not present

## 2022-07-03 DIAGNOSIS — E1122 Type 2 diabetes mellitus with diabetic chronic kidney disease: Secondary | ICD-10-CM

## 2022-07-03 DIAGNOSIS — I10 Essential (primary) hypertension: Secondary | ICD-10-CM | POA: Diagnosis not present

## 2022-07-03 DIAGNOSIS — N1831 Chronic kidney disease, stage 3a: Secondary | ICD-10-CM | POA: Diagnosis not present

## 2022-07-03 DIAGNOSIS — Z23 Encounter for immunization: Secondary | ICD-10-CM | POA: Diagnosis not present

## 2022-07-03 DIAGNOSIS — Z794 Long term (current) use of insulin: Secondary | ICD-10-CM | POA: Diagnosis not present

## 2022-07-03 DIAGNOSIS — K51919 Ulcerative colitis, unspecified with unspecified complications: Secondary | ICD-10-CM | POA: Diagnosis not present

## 2022-07-03 NOTE — Patient Instructions (Signed)
Lab and urine ordered (you can do it when you like).  Flu shot given today.   I am placing referrals to GI and Eye doctor.  Follow up in 3 months.  Take care  Dr. Lacinda Axon

## 2022-07-03 NOTE — Assessment & Plan Note (Signed)
Overdue for colonoscopy.  Referring to GI.

## 2022-07-03 NOTE — Progress Notes (Signed)
Subjective:  Patient ID: Shaun Harris, male    DOB: 07-22-1965  Age: 56 y.o. MRN: [id number]  CC: Chief Complaint  Patient presents with   Follow-up    medications    HPI:  56 year old male with an extensive past medical history presents for follow-up.  Patient's COPD is stable on Advair.  Blood pressure is well-controlled on enalapril and Lasix.    Renal function stable.  Follows up with nephrology.  Patient states his blood sugars have improved particular when he is able to get his Ozempic.  There has been issues with getting this from his pharmacy.  He reports that his sugar this morning was 165.  Needs labs and A1c.  He is in need of an eye exam as well.   Ulcerative colitis has been stable.  Needs to see GI.  Overdue for colonoscopy.  Patient Active Problem List   Diagnosis Date Noted   Migraine 04/04/2022   BPH (benign prostatic hyperplasia) 01/01/2022   Ulcerative colitis ([place]) 07/02/2021   Type 2 diabetes mellitus with diabetic chronic kidney disease ([place]) 07/02/2021   History of seizure 03/14/2021   Stage 3 chronic kidney disease ([place]) 02/06/2021   Nephrolithiasis 07/25/2020   COPD (chronic obstructive pulmonary disease) ([place]) 03/10/2013   Tobacco abuse 03/10/2013   Essential hypertension 10/01/2012   Hyperlipidemia 10/01/2012   Chronic pain syndrome 10/01/2012    Social Hx   Social History   Socioeconomic History   Marital status: Married    Spouse name: Not on file   Number of children: Not on file   Years of education: Not on file   Highest education level: Not on file  Occupational History   Not on file  Tobacco Use   Smoking status: Every Day    Packs/day: 1.00    Years: 35.00    Total pack years: 35.00    Types: Cigarettes   Smokeless tobacco: Never  Vaping Use   Vaping Use: Never used  Substance and Sexual Activity   Alcohol use: No   Drug use: No   Sexual activity: Yes    Birth control/protection: None  Other Topics Concern   Not  on file  Social History Narrative   Not on file   Social Determinants of Health   Financial Resource Strain: Not on file  Food Insecurity: Not on file  Transportation Needs: Not on file  Physical Activity: Not on file  Stress: Not on file  Social Connections: Not on file    Review of Systems  Constitutional: Negative.   Respiratory: Negative.    Cardiovascular: Negative.   Neurological:  Positive for headaches.   Objective:  BP 130/76   Pulse 97   Temp 97.7 F (36.5 C) (Oral)   Wt 234 lb (106.1 kg)   SpO2 97%   BMI 36.65 kg/m      07/03/2022    9:36 AM 04/03/2022   10:17 AM 01/01/2022   10:44 AM  BP/Weight  Systolic BP 409 811 81  Diastolic BP 76 65 53  Wt. (Lbs) 234 233   BMI 36.65 kg/m2 36.49 kg/m2     Physical Exam Vitals and nursing note reviewed.  Constitutional:      General: He is not in acute distress.    Appearance: Normal appearance. He is obese.  HENT:     Head: Normocephalic and atraumatic.  Cardiovascular:     Rate and Rhythm: Normal rate and regular rhythm.  Pulmonary:     Effort: Pulmonary effort  is normal.     Breath sounds: Normal breath sounds. No wheezing, rhonchi or rales.  Neurological:     Mental Status: He is alert.  Psychiatric:        Mood and Affect: Mood normal.        Behavior: Behavior normal.     Lab Results  Component Value Date   WBC 7.5 10/01/2021   HGB 12.9 (L) 10/01/2021   HCT 37.8 10/01/2021   PLT 238 10/01/2021   GLUCOSE 170 (H) 10/01/2021   CHOL 142 01/01/2022   TRIG 122 01/01/2022   HDL 45 01/01/2022   LDLCALC 75 01/01/2022   ALT 17 10/01/2021   AST 14 10/01/2021   NA 142 10/01/2021   K 5.1 10/01/2021   CL 103 10/01/2021   CREATININE 1.36 (H) 10/01/2021   BUN 20 10/01/2021   CO2 27 10/01/2021   HGBA1C 8.9 (H) 01/01/2022     Assessment & Plan:   Problem List Items Addressed This Visit       Cardiovascular and Mediastinum   Essential hypertension    Stable.  Continue enalapril and Lasix.         Digestive   Ulcerative colitis ([place])    Overdue for colonoscopy.  Referring to GI.      Relevant Orders   Ambulatory referral to Gastroenterology     Endocrine   Type 2 diabetes mellitus with diabetic chronic kidney disease ([place]) - Primary    A1c today to reassess.  Continue current medications. Referring for eye exam.      Relevant Orders   Hemoglobin A1c   Microalbumin / creatinine urine ratio   Ambulatory referral to Ophthalmology     Genitourinary   Stage 3 chronic kidney disease ([facility])    GFR recently improved.  Follows with nephrology.      Other Visit Diagnoses     Need for vaccination       Relevant Orders   Flu Vaccine QUAD 6+ mos PF IM (Fluarix Quad PF) (Completed)       Follow-up:  3 months  Jamorion Gomillion Lacinda Axon DO [facility]

## 2022-07-03 NOTE — Assessment & Plan Note (Signed)
Stable.  Continue enalapril and Lasix.

## 2022-07-03 NOTE — Assessment & Plan Note (Addendum)
A1c today to reassess.  Continue current medications. Referring for eye exam.

## 2022-07-03 NOTE — Assessment & Plan Note (Signed)
GFR recently improved.  Follows with nephrology.

## 2022-07-04 LAB — HEMOGLOBIN A1C
Est. average glucose Bld gHb Est-mCnc: 174 mg/dL
Hgb A1c MFr Bld: 7.7 % — ABNORMAL HIGH (ref 4.8–5.6)

## 2022-07-04 LAB — MICROALBUMIN / CREATININE URINE RATIO
Creatinine, Urine: 41.1 mg/dL
Microalb/Creat Ratio: <7 mg/g creat (ref 0–29)
Microalbumin, Urine: <3 ug/mL

## 2022-07-05 NOTE — Addendum Note (Signed)
Addended by: Dairl Ponder on: 07/05/2022 01:55 PM   Modules accepted: Orders

## 2022-07-22 ENCOUNTER — Other Ambulatory Visit: Payer: Self-pay | Admitting: Family Medicine

## 2022-07-22 DIAGNOSIS — E785 Hyperlipidemia, unspecified: Secondary | ICD-10-CM

## 2022-07-22 DIAGNOSIS — E1165 Type 2 diabetes mellitus with hyperglycemia: Secondary | ICD-10-CM

## 2022-08-08 ENCOUNTER — Other Ambulatory Visit: Payer: Self-pay | Admitting: Family Medicine

## 2022-08-21 ENCOUNTER — Other Ambulatory Visit: Payer: Self-pay | Admitting: Family Medicine

## 2022-08-21 DIAGNOSIS — E1165 Type 2 diabetes mellitus with hyperglycemia: Secondary | ICD-10-CM

## 2022-08-22 ENCOUNTER — Other Ambulatory Visit: Payer: Self-pay | Admitting: Family Medicine

## 2022-08-22 ENCOUNTER — Telehealth: Payer: Self-pay | Admitting: Family Medicine

## 2022-08-22 NOTE — Telephone Encounter (Signed)
PA for Levemir has been denied. Insurance is requesting pt try [place] solution. Please advise. Thank you

## 2022-08-23 ENCOUNTER — Other Ambulatory Visit: Payer: Self-pay | Admitting: Family Medicine

## 2022-08-23 MED ORDER — TRESIBA FLEXTOUCH 200 UNIT/ML ~~LOC~~ SOPN: 120.0000 [IU] | PEN_INJECTOR | Freq: Every day | SUBCUTANEOUS | 11 refills | Status: DC

## 2022-08-26 NOTE — Telephone Encounter (Signed)
Left detailed message on voicemail (OK per DPR)

## 2022-08-27 ENCOUNTER — Other Ambulatory Visit: Payer: Self-pay | Admitting: Family Medicine

## 2022-08-31 ENCOUNTER — Other Ambulatory Visit: Payer: Self-pay | Admitting: Family Medicine

## 2022-09-04 NOTE — Patient Instructions (Incomplete)

## 2022-09-05 ENCOUNTER — Ambulatory Visit: Payer: Medicare Other | Admitting: Nurse Practitioner

## 2022-09-11 ENCOUNTER — Ambulatory Visit: Payer: Medicare Other | Admitting: Urology

## 2022-09-11 ENCOUNTER — Ambulatory Visit (INDEPENDENT_AMBULATORY_CARE_PROVIDER_SITE_OTHER): Payer: Medicare Other | Admitting: Urology

## 2022-09-11 ENCOUNTER — Ambulatory Visit (HOSPITAL_COMMUNITY)
Admission: RE | Admit: 2022-09-11 | Discharge: 2022-09-11 | Disposition: A | Discharge status: Home / Self Care | Payer: Medicare Other | Source: Ambulatory Visit | Attending: Urology | Admitting: Urology

## 2022-09-11 ENCOUNTER — Other Ambulatory Visit: Payer: Self-pay | Admitting: Urology

## 2022-09-11 VITALS — BP 128/80 | HR 116

## 2022-09-11 DIAGNOSIS — R300 Dysuria: Secondary | ICD-10-CM

## 2022-09-11 DIAGNOSIS — R350 Frequency of micturition: Secondary | ICD-10-CM | POA: Diagnosis not present

## 2022-09-11 DIAGNOSIS — R3912 Poor urinary stream: Secondary | ICD-10-CM | POA: Diagnosis not present

## 2022-09-11 DIAGNOSIS — R3915 Urgency of urination: Secondary | ICD-10-CM | POA: Diagnosis not present

## 2022-09-11 DIAGNOSIS — N200 Calculus of kidney: Secondary | ICD-10-CM | POA: Diagnosis present

## 2022-09-11 MED ORDER — ONDANSETRON HCL 4 MG PO TABS: 4.0000 mg | ORAL_TABLET | Freq: Three times a day (TID) | ORAL | 0 refills | Status: DC | PRN

## 2022-09-11 MED ORDER — HYDROMORPHONE HCL 2 MG PO TABS: 2.0000 mg | ORAL_TABLET | ORAL | 0 refills | Status: DC | PRN

## 2022-09-11 MED ORDER — TAMSULOSIN HCL 0.4 MG PO CAPS: 0.4000 mg | ORAL_CAPSULE | Freq: Every day | ORAL | 3 refills | Status: DC

## 2022-09-11 MED ORDER — SULFAMETHOXAZOLE-TRIMETHOPRIM 800-160 MG PO TABS: 1.0000 | ORAL_TABLET | Freq: Two times a day (BID) | ORAL | 0 refills | Status: DC

## 2022-09-11 NOTE — Progress Notes (Unsigned)
09/11/2022 3:53 PM   CORDARRO SPEIER 19-May-1966 [id number]  Referring provider: Coral Spikes, DO [place],  [place] [address]  No chief complaint on file.   HPI:    PMH: Past Medical History:  Diagnosis Date   Anxiety    Arthritis    Cerebral vasculitis    Chronic headaches    constant since possible stroke in 1997,"Vasculitis of brain with increased pressure of brain ".   COPD (chronic obstructive pulmonary disease) (HCC)    COPD (chronic obstructive pulmonary disease) (HCC)    Diabetes mellitus    Hyperlipidemia    Hypertension    Hypertriglyceridemia    Sleep apnea    has but doesnt use, cannot tolerate; PCP aware   Stroke [facility])    pt states,"they arent sure if i had a stroke in 1997, there is still a question about that with my doctors". No deficits.   Tobacco abuse    Ulcerative colitis ([place])    Ulcerative colitis ([place])    Venous stasis     Surgical History: Past Surgical History:  Procedure Laterality Date   BIOPSY  10/25/2016   Procedure: BIOPSY;  Surgeon: Rogene Houston, MD;  [facility];  Service: Endoscopy;;  colon   CERVICAL SPINE SURGERY  2008   x2-last surgery 04/24/2016.   COLONOSCOPY WITH PROPOFOL N/A 10/25/2016   Procedure: COLONOSCOPY WITH PROPOFOL;  Surgeon: Rogene Houston, MD;  Location: [facility];  Service: Endoscopy;  Laterality: N/A;  7:30   EXTRACORPOREAL SHOCK WAVE LITHOTRIPSY Left 08/08/2020   Procedure: EXTRACORPOREAL SHOCK WAVE LITHOTRIPSY (ESWL);  Surgeon: Cleon Gustin, MD;  Location: [facility];  Service: Urology;  Laterality: Left;   KNEE ARTHROSCOPY  02/25/2012   Procedure: ARTHROSCOPY KNEE;  Surgeon: Sanjuana Kava, MD;  Location: [facility];  Service: Orthopedics;  Laterality: Right;   KNEE ARTHROSCOPY WITH MEDIAL MENISECTOMY Left 12/23/2014   Procedure: LEFT KNEE ARTHROSCOPY WITH PARTIAL MENISECTOMY;  Surgeon: Sanjuana Kava, MD;  Location: [facility];  Service: Orthopedics;  Laterality: Left;    POLYPECTOMY  10/25/2016   Procedure: POLYPECTOMY;  Surgeon: Rogene Houston, MD;  [facility];  Service: Endoscopy;;  colon   SHOULDER ARTHROSCOPY  2004   left shoulder   SHOULDER ARTHROSCOPY  2008   right shoulder    Home Medications:  Allergies as of 09/11/2022       Reactions   Gabapentin Other (See Comments)   Increased blood sugar        Medication List        Accurate as of September 11, 2022  3:53 PM. If you have any questions, ask your nurse or doctor.          acetaminophen 500 MG tablet Commonly known as: TYLENOL Take 1,000 mg by mouth every 6 (six) hours as needed for moderate pain.   Aimovig 70 MG/ML Soaj Generic drug: Erenumab-aooe Inject 70 mg into the skin every 30 (thirty) days.   albuterol 108 (90 Base) MCG/ACT inhaler Commonly known as: VENTOLIN HFA INHALE 2 PUFFS BY MOUTH EVERY 6 HOURS AS NEEDED FOR SHORTNESS OF BREATH.   ALPRAZolam 0.5 MG tablet Commonly known as: XANAX Take 0.5 mg by mouth 2 (two) times daily.   ascorbic acid 500 MG tablet Commonly known as: VITAMIN C Take 500 mg by mouth daily.   aspirin EC 325 MG tablet Take 325 mg by mouth at bedtime.   atorvastatin 20 MG tablet Commonly known as:  LIPITOR TAKE ONE TABLET BY MOUTH EVERY DAY   Blood Glucose Monitoring Suppl w/Device Kit Test glucose 4 times daily. E11.9   buPROPion 150 MG 12 hr tablet Commonly known as: WELLBUTRIN SR TAKE ONE TABLET BY MOUTH TWICE DAILY   CENTRUM PO Take 1 tablet by mouth daily.   enalapril 20 MG tablet Commonly known as: VASOTEC Take 1 tablet (20 mg total) by mouth 2 (two) times daily.   fluticasone-salmeterol 250-50 MCG/ACT Aepb Commonly known as: Advair Diskus USE 1 INHALATION BY MOUTH EVERY 12 HOURS. <<RINSE MOUTH AFTER USE>>.   furosemide 20 MG tablet Commonly known as: LASIX TAKE ONE TABLET BY MOUTH EVERY DAY   hydrOXYzine 50 MG tablet Commonly known as: ATARAX Take 50 mg by mouth daily as needed for anxiety.    insulin aspart 100 UNIT/ML injection Commonly known as: NovoLOG Inject 15 Units into the skin 3 (three) times daily with meals.   levETIRAcetam 500 MG tablet Commonly known as: KEPPRA TAKE 3 TABLETS BY MOUTH TWICE DAILY.   Mesalamine 400 MG Cpdr DR capsule Commonly known as: ASACOL TAKE ONE CAPSULE BY MOUTH FOUR TIMES DAILY   morphine 30 MG 12 hr tablet Commonly known as: MS CONTIN Take 1 tablet (30 mg total) by mouth 2 (two) times daily. May fill 60 days after 09/30/2017   ondansetron 4 MG tablet Commonly known as: Zofran Take 1 tablet (4 mg total) by mouth every 8 (eight) hours as needed for nausea or vomiting. Started by: Nicolette Bang, MD   Oxycodone HCl 10 MG Tabs Take 1 tablet by mouth 4 times a day as needed for pain. Do not drive or operate machinery while on this medicine. What changed:  how much to take how to take this when to take this reasons to take this additional instructions   Ozempic (2 MG/DOSE) 8 MG/3ML Sopn Generic drug: Semaglutide (2 MG/DOSE) inject '2mg'$  SUBCUTANEOUSLY WEEKLY   Potassium Citrate 15 MEQ (1620 MG) Tbcr Take 1 tablet by mouth 2 (two) times daily.   promethazine 25 MG tablet Commonly known as: PHENERGAN Take 0.5 tablets (12.5 mg total) by mouth every 6 (six) hours as needed for nausea or vomiting. What changed: how much to take   sodium bicarbonate 650 MG tablet Take 1 tablet (650 mg total) by mouth 2 (two) times daily.   sulfamethoxazole-trimethoprim 800-160 MG tablet Commonly known as: BACTRIM DS Take 1 tablet by mouth every 12 (twelve) hours. Started by: Nicolette Bang, MD   tamsulosin 0.4 MG Caps capsule Commonly known as: FLOMAX Take 1 capsule (0.4 mg total) by mouth daily after supper.   terazosin 10 MG capsule Commonly known as: HYTRIN TAKE ONE CAPSULE BY MOUTH EVERY DAY   Tresiba FlexTouch 200 UNIT/ML FlexTouch Pen Generic drug: insulin degludec Inject 120 Units into the skin daily.   True Metrix Blood  Glucose Test test strip Generic drug: glucose blood CHECK BLOOD SUGAR 4 TIMES DAILY.   TRUEplus Insulin Syringe 31G X 5/16" 1 ML Misc Generic drug: Insulin Syringe-Needle U-100 AS DIRECTED   venlafaxine XR 37.5 MG 24 hr capsule Commonly known as: EFFEXOR-XR Take 1 capsule (37.5 mg total) by mouth daily.        Allergies:  Allergies  Allergen Reactions   Gabapentin Other (See Comments)    Increased blood sugar    Family History: Family History  Problem Relation Age of Onset   Hypertension Father    Diabetes Father    Heart disease Father    Heart disease Maternal  Grandfather    Heart attack Other    Heart disease Brother        both brothers have heart disease   Hypertension Sister     Social History:  reports that he has been smoking cigarettes. He has a 35.00 pack-year smoking history. He has never used smokeless tobacco. He reports that he does not drink alcohol and does not use drugs.  ROS: All other review of systems were reviewed and are negative except what is noted above in HPI  Physical Exam: BP 128/80   Pulse (!) 116   Constitutional:  Alert and oriented, No acute distress. HEENT:  AT, moist mucus membranes.  Trachea midline, no masses. Cardiovascular: No clubbing, cyanosis, or edema. Respiratory: Normal respiratory effort, no increased work of breathing. GI: Abdomen is soft, nontender, nondistended, no abdominal masses GU: No CVA tenderness.  Lymph: No cervical or inguinal lymphadenopathy. Skin: No rashes, bruises or suspicious lesions. Neurologic: Grossly intact, no focal deficits, moving all 4 extremities. Psychiatric: Normal mood and affect.  Laboratory Data: Lab Results  Component Value Date   WBC 7.5 10/01/2021   HGB 12.9 (L) 10/01/2021   HCT 37.8 10/01/2021   MCV 93 10/01/2021   PLT 238 10/01/2021    Lab Results  Component Value Date   CREATININE 1.36 (H) 10/01/2021    No results found for: "PSA"  No results found for:  "TESTOSTERONE"  Lab Results  Component Value Date   HGBA1C 7.7 (H) 07/03/2022    Urinalysis    Component Value Date/Time   COLORURINE YELLOW 07/21/2020 2356   APPEARANCEUR Clear 06/25/2021 1543   LABSPEC 1.020 07/21/2020 2356   PHURINE 6.0 07/21/2020 2356   GLUCOSEU Negative 06/25/2021 1543   HGBUR SMALL (A) 07/21/2020 2356   BILIRUBINUR Negative 06/25/2021 1543   KETONESUR >80 (A) 07/21/2020 2356   PROTEINUR Negative 06/25/2021 1543   PROTEINUR NEGATIVE 07/21/2020 2356   UROBILINOGEN 0.2 12/21/2014 0845   NITRITE Negative 06/25/2021 1543   NITRITE NEGATIVE 07/21/2020 2356   LEUKOCYTESUR Negative 06/25/2021 1543   LEUKOCYTESUR NEGATIVE 07/21/2020 2356    Lab Results  Component Value Date   LABMICR <3.0 07/03/2022   [place]andon None seen 07/25/2020   LABEPIT None seen 07/25/2020   MUCUS Present 07/25/2020   BACTERIA Few 07/25/2020    Pertinent Imaging: *** Results for orders placed during the hospital encounter of 12/26/21  Abdomen 1 view (KUB)  Narrative CLINICAL DATA:  Follow-up kidney stone.  EXAM: ABDOMEN - 1 VIEW  COMPARISON:  June 25, 2021  FINDINGS: The bowel gas pattern is normal. 7 mm stone is persistent in the lower pole right kidney. There is a question 2 mm stone in the lower pole left kidney. Stable pelvic phleboliths are unchanged.  IMPRESSION: 7 mm stone is persistent in the lower pole right kidney. There is a question 2 mm stone in the lower pole left kidney.   Electronically Signed By: Abelardo Diesel M.D. On: 12/27/2021 09:52  No results found for this or any previous visit.  No results found for this or any previous visit.  No results found for this or any previous visit.  Results for orders placed during the hospital encounter of 12/18/20  Ultrasound renal complete  Narrative CLINICAL DATA:  Nephrolithiasis.  EXAM: RENAL / URINARY TRACT ULTRASOUND COMPLETE  COMPARISON:  September 14, 2020.  FINDINGS: Right Kidney:  Renal  measurements: 11.9 x 6.0 x 6.3 cm = volume: 239 mL. Probable 12 mm nonobstructive calculus seen in midpole collecting  system. Echogenicity within normal limits. No mass or hydronephrosis visualized.  Left Kidney:  Renal measurements: 13.4 x 5.7 x 5.6 cm = volume: 224 mL. Probable nonobstructive 7 mm calculus seen in midpole collecting system. Echogenicity within normal limits. No mass or hydronephrosis visualized.  Bladder:  Appears normal for degree of bladder distention. Bilateral ureteral jets are noted.  Other:  None.  IMPRESSION: Probable bilateral nonobstructive nephrolithiasis. No hydronephrosis or renal obstruction is noted.   Electronically Signed By: Marijo Conception M.D. On: 12/18/2020 13:39  No valid procedures specified. No results found for this or any previous visit.  Results for orders placed in visit on 09/11/22  CT RENAL STONE STUDY  Narrative CLINICAL DATA:  Bilateral flank pain, history of calculi  EXAM: CT ABDOMEN AND PELVIS WITHOUT CONTRAST  TECHNIQUE: Multidetector CT imaging of the abdomen and pelvis was performed following the standard protocol without IV contrast.  RADIATION DOSE REDUCTION: This exam was performed according to the departmental dose-optimization program which includes automated exposure control, adjustment of the mA and/or kV according to patient size and/or use of iterative reconstruction technique.  COMPARISON:  09/14/2020 and radiographs from 12/26/2021  FINDINGS: Lower chest: Unremarkable  Hepatobiliary: Unremarkable  Pancreas: Unremarkable  Spleen: Unremarkable  Adrenals/Urinary Tract: Stable 3.7 by 4.1 cm right adrenal myelolipoma, image 19 series 2. No signs of interval hematoma. This lesion is benign and does not require further imaging workup.  Cluster calculi in the right kidney lower pole measuring up to 1.1 cm in long axis. There is also a 2 mm right kidney upper pole nonobstructive renal  calculus.  Two 2 mm left renal calculi are present.  No hydronephrosis or hydroureter.  No ureteral or bladder calculus.  Stomach/Bowel: Unremarkable.  Normal appendix.  Vascular/Lymphatic: Atherosclerosis is present, including aortoiliac atherosclerotic disease.  Reproductive: Borderline prostatomegaly.  Other: No supplemental non-categorized findings.  Musculoskeletal: Mild lumbar spondylosis and degenerative disc disease.  IMPRESSION: 1. Bilateral nonobstructive nephrolithiasis. 2. Stable 4.1 cm right adrenal myelolipoma. This lesion is benign and does not require further imaging workup. 3. Borderline prostatomegaly. 4. Mild lumbar spondylosis and degenerative disc disease. 5. Aortic atherosclerosis.  Aortic Atherosclerosis (ICD10-I70.0).   Electronically Signed By: Van Clines M.D. On: 09/11/2022 15:04   Assessment & Plan:    1. Nephrolithiasis *** - Urinalysis, Routine w reflex microscopic - tamsulosin (FLOMAX) 0.4 MG CAPS capsule; Take 1 capsule (0.4 mg total) by mouth daily after supper.  Dispense: 90 capsule; Refill: 3  2. Weak urinary stream, UTI -bactrim DS BID for 14 days   No follow-ups on file.  Nicolette Bang, MD  [facility] [place]

## 2022-09-12 ENCOUNTER — Encounter: Payer: Self-pay | Admitting: Urology

## 2022-09-12 NOTE — Patient Instructions (Signed)
Urinary Tract Infection, Adult  A urinary tract infection (UTI) is an infection of any part of the urinary tract. The urinary tract includes the kidneys, ureters, bladder, and urethra. These organs make, store, and get rid of urine in the body. An upper UTI affects the ureters and kidneys. A lower UTI affects the bladder and urethra. What are the causes? Most urinary tract infections are caused by bacteria in your genital area around your urethra, where urine leaves your body. These bacteria grow and cause inflammation of your urinary tract. What increases the risk? You are more likely to develop this condition if: You have a urinary catheter that stays in place. You are not able to control when you urinate or have a bowel movement (incontinence). You are male and you: Use a spermicide or diaphragm for birth control. Have low estrogen levels. Are pregnant. You have certain genes that increase your risk. You are sexually active. You take antibiotic medicines. You have a condition that causes your flow of urine to slow down, such as: An enlarged prostate, if you are male. Blockage in your urethra. A kidney stone. A nerve condition that affects your bladder control (neurogenic bladder). Not getting enough to drink, or not urinating often. You have certain medical conditions, such as: Diabetes. A weak disease-fighting system (immunesystem). Sickle cell disease. Gout. Spinal cord injury. What are the signs or symptoms? Symptoms of this condition include: Needing to urinate right away (urgency). Frequent urination. This may include small amounts of urine each time you urinate. Pain or burning with urination. Blood in the urine. Urine that smells bad or unusual. Trouble urinating. Cloudy urine. Vaginal discharge, if you are male. Pain in the abdomen or the lower back. You may also have: Vomiting or a decreased appetite. Confusion. Irritability or tiredness. A fever or  chills. Diarrhea. The first symptom in older adults may be confusion. In some cases, they may not have any symptoms until the infection has worsened. How is this diagnosed? This condition is diagnosed based on your medical history and a physical exam. You may also have other tests, including: Urine tests. Blood tests. Tests for STIs (sexually transmitted infections). If you have had more than one UTI, a cystoscopy or imaging studies may be done to determine the cause of the infections. How is this treated? Treatment for this condition includes: Antibiotic medicine. Over-the-counter medicines to treat discomfort. Drinking enough water to stay hydrated. If you have frequent infections or have other conditions such as a kidney stone, you may need to see a health care provider who specializes in the urinary tract (urologist). In rare cases, urinary tract infections can cause sepsis. Sepsis is a life-threatening condition that occurs when the body responds to an infection. Sepsis is treated in the hospital with IV antibiotics, fluids, and other medicines. Follow these instructions at home:  Medicines Take over-the-counter and prescription medicines only as told by your health care provider. If you were prescribed an antibiotic medicine, take it as told by your health care provider. Do not stop using the antibiotic even if you start to feel better. General instructions Make sure you: Empty your bladder often and completely. Do not hold urine for long periods of time. Empty your bladder after sex. Wipe from front to back after urinating or having a bowel movement if you are male. Use each tissue only one time when you wipe. Drink enough fluid to keep your urine pale yellow. Keep all follow-up visits. This is important. Contact a health   care provider if: Your symptoms do not get better after 1-2 days. Your symptoms go away and then return. Get help right away if: You have severe pain in  your back or your lower abdomen. You have a fever or chills. You have nausea or vomiting. Summary A urinary tract infection (UTI) is an infection of any part of the urinary tract, which includes the kidneys, ureters, bladder, and urethra. Most urinary tract infections are caused by bacteria in your genital area. Treatment for this condition often includes antibiotic medicines. If you were prescribed an antibiotic medicine, take it as told by your health care provider. Do not stop using the antibiotic even if you start to feel better. Keep all follow-up visits. This is important. This information is not intended to replace advice given to you by your health care provider. Make sure you discuss any questions you have with your health care provider. Document Revised: 02/11/2020 Document Reviewed: 02/11/2020 Elsevier Patient Education  2023 [employer]  

## 2022-09-16 ENCOUNTER — Telehealth (INDEPENDENT_AMBULATORY_CARE_PROVIDER_SITE_OTHER): Payer: Self-pay | Admitting: Gastroenterology

## 2022-09-16 ENCOUNTER — Encounter (INDEPENDENT_AMBULATORY_CARE_PROVIDER_SITE_OTHER): Payer: Self-pay | Admitting: Gastroenterology

## 2022-09-16 ENCOUNTER — Ambulatory Visit (INDEPENDENT_AMBULATORY_CARE_PROVIDER_SITE_OTHER): Payer: Medicare Other | Admitting: Gastroenterology

## 2022-09-16 VITALS — BP 116/65 | HR 122 | Temp 97.8°F | Ht 67.0 in | Wt 225.9 lb

## 2022-09-16 DIAGNOSIS — K51919 Ulcerative colitis, unspecified with unspecified complications: Secondary | ICD-10-CM | POA: Diagnosis not present

## 2022-09-16 DIAGNOSIS — Z1211 Encounter for screening for malignant neoplasm of colon: Secondary | ICD-10-CM

## 2022-09-16 MED ORDER — PEG 3350-KCL-NA BICARB-NACL 420 G PO SOLR: 4000.0000 mL | Freq: Once | ORAL | 0 refills | Status: DC

## 2022-09-16 NOTE — Patient Instructions (Signed)
Schedule colonoscopy Continue with mesalamine 400 mg every 6 hours

## 2022-09-16 NOTE — Telephone Encounter (Signed)
Pt returned call and set up colonoscopy

## 2022-09-16 NOTE — Telephone Encounter (Signed)
Attempted to contact pt to set up colonoscopy (ASA 1). Left message to return call. Will need BMET; Tyler Aas 1/2 dose night prior and Ozempic will need to be help for one week.

## 2022-09-16 NOTE — Progress Notes (Signed)
Maylon Peppers, M.D. Gastroenterology & Hepatology [facility] [address] [place], [place] [address] Primary Care Physician: Lamark, Machen, DO [place] [address]  Referring MD: PCP  Chief Complaint:  Ulcerative colitis  History of Present Illness: Shaun Harris is a 57 y.o. male with PMH UC, stroke, anxiety, COPD, urolithiasis, DM, HLD, HTN, OSA, who presents for evaluation of ulcerative colitis.  Patient was diagnosed with UC in 1998. He has been on mesalamine since then, currently takes it 400 mg every 6 hours. He reports that the usually has soft Bms, 2-3 times a day, but sometimes he has hard BM - has to strain when he has these BMs. He reports a history of chronic migraines for which he takes morphine MS 30 mg BID and oxycodone 10 mg as needed. Has some occasional nausea since he has felt his kidney stones  are back but this was not present in the past. The patient denies having any vomiting, fever, chills, hematochezia, melena, hematemesis, abdominal distention, frequent abdominal pain, diarrhea, jaundice, pruritus or weight loss.  Labs from 06/20/2022 showed WBC 11,000, hemoglobin 14.0, platelets 299, BMP with creatinine 1.37, sodium 142, potassium 4.7, BUN 22.  Last Colonoscopy:09/2016 Exam performed to the cecum. No evidence of active colitis. Patchy edema at the sigmoid colon and rectum. Biopsies taken. Diagnosis: colon ascending colon: focal active acute inflammation. Colon descending: Mild increase in chronic inflammation. Colon sigmoid and rectum: chronic changes changes but significant inflammation. Hyperplastic polyp.   FHx: neg for any gastrointestinal/liver disease, no malignancies Social: smokes 1 pack a day, neg alcohol or illicit drug use Surgical: no abdominal surgeries  Past Medical History: Past Medical History:  Diagnosis Date   Anxiety    Arthritis    Cerebral vasculitis    Chronic headaches     constant since possible stroke in 1997,"Vasculitis of brain with increased pressure of brain ".   COPD (chronic obstructive pulmonary disease) (HCC)    COPD (chronic obstructive pulmonary disease) (HCC)    Diabetes mellitus    Hyperlipidemia    Hypertension    Hypertriglyceridemia    Sleep apnea    has but doesnt use, cannot tolerate; PCP aware   Stroke [facility])    pt states,"they arent sure if i had a stroke in 1997, there is still a question about that with my doctors". No deficits.   Tobacco abuse    Ulcerative colitis ([place])    Ulcerative colitis ([place])    Venous stasis     Past Surgical History: Past Surgical History:  Procedure Laterality Date   BIOPSY  10/25/2016   Procedure: BIOPSY;  Surgeon: Rogene Houston, MD;  [facility];  Service: Endoscopy;;  colon   CERVICAL SPINE SURGERY  2008   x2-last surgery 04/24/2016.   COLONOSCOPY WITH PROPOFOL N/A 10/25/2016   Procedure: COLONOSCOPY WITH PROPOFOL;  Surgeon: Rogene Houston, MD;  Location: [facility];  Service: Endoscopy;  Laterality: N/A;  7:30   EXTRACORPOREAL SHOCK WAVE LITHOTRIPSY Left 08/08/2020   Procedure: EXTRACORPOREAL SHOCK WAVE LITHOTRIPSY (ESWL);  Surgeon: Cleon Gustin, MD;  Location: [facility];  Service: Urology;  Laterality: Left;   KNEE ARTHROSCOPY  02/25/2012   Procedure: ARTHROSCOPY KNEE;  Surgeon: Sanjuana Kava, MD;  Location: [facility];  Service: Orthopedics;  Laterality: Right;   KNEE ARTHROSCOPY WITH MEDIAL MENISECTOMY Left 12/23/2014   Procedure: LEFT KNEE ARTHROSCOPY WITH PARTIAL MENISECTOMY;  Surgeon: Sanjuana Kava, MD;  Location:  [facility];  Service: Orthopedics;  Laterality: Left;   POLYPECTOMY  10/25/2016   Procedure: POLYPECTOMY;  Surgeon: Rogene Houston, MD;  [facility];  Service: Endoscopy;;  colon   SHOULDER ARTHROSCOPY  2004   left shoulder   SHOULDER ARTHROSCOPY  2008   right shoulder    Family History: Family History  Problem Relation Age of Onset   Hypertension  Father    Diabetes Father    Heart disease Father    Heart disease Maternal Grandfather    Heart attack Other    Heart disease Brother        both brothers have heart disease   Hypertension Sister     Social History: Social History   Tobacco Use  Smoking Status Every Day   Packs/day: 1.00   Years: 35.00   Total pack years: 35.00   Types: Cigarettes  Smokeless Tobacco Never   Social History   Substance and Sexual Activity  Alcohol Use No   Social History   Substance and Sexual Activity  Drug Use No    Allergies: Allergies  Allergen Reactions   Gabapentin Other (See Comments)    Increased blood sugar    Medications: Current Outpatient Medications  Medication Sig Dispense Refill   acetaminophen (TYLENOL) 500 MG tablet Take 1,000 mg by mouth every 6 (six) hours as needed for moderate pain.     AIMOVIG 70 MG/ML SOAJ Inject 70 mg into the skin every 30 (thirty) days.     albuterol (VENTOLIN HFA) 108 (90 Base) MCG/ACT inhaler INHALE 2 PUFFS BY MOUTH EVERY 6 HOURS AS NEEDED FOR SHORTNESS OF BREATH. 18 g 5   ALPRAZolam (XANAX) 0.5 MG tablet Take 0.5 mg by mouth 2 (two) times daily.     aspirin EC 325 MG tablet Take 325 mg by mouth at bedtime.     atorvastatin (LIPITOR) 20 MG tablet TAKE ONE TABLET BY MOUTH EVERY DAY 90 tablet 0   Blood Glucose Monitoring Suppl w/Device KIT Test glucose 4 times daily. E11.9 100 each 5   buPROPion (WELLBUTRIN SR) 150 MG 12 hr tablet TAKE ONE TABLET BY MOUTH TWICE DAILY 180 tablet 1   enalapril (VASOTEC) 20 MG tablet Take 1 tablet (20 mg total) by mouth 2 (two) times daily. 180 tablet 1   fluticasone-salmeterol (ADVAIR DISKUS) 250-50 MCG/ACT AEPB USE 1 INHALATION BY MOUTH EVERY 12 HOURS. <<RINSE MOUTH AFTER USE>>. 180 each 1   furosemide (LASIX) 20 MG tablet TAKE ONE TABLET BY MOUTH EVERY DAY 90 tablet 1   HYDROmorphone (DILAUDID) 2 MG tablet Take 1 tablet (2 mg total) by mouth every 4 (four) hours as needed for severe pain. 30 tablet 0    hydrOXYzine (ATARAX/VISTARIL) 50 MG tablet Take 50 mg by mouth daily as needed for anxiety.     insulin aspart (NOVOLOG) 100 UNIT/ML injection Inject 15 Units into the skin 3 (three) times daily with meals. 30 mL 3   insulin degludec (TRESIBA FLEXTOUCH) 200 UNIT/ML FlexTouch Pen Inject 120 Units into the skin daily. 18 mL 11   levETIRAcetam (KEPPRA) 500 MG tablet TAKE 3 TABLETS BY MOUTH TWICE DAILY. 540 tablet 1   Mesalamine (ASACOL) 400 MG CPDR DR capsule TAKE ONE CAPSULE BY MOUTH FOUR TIMES DAILY 360 capsule 1   morphine (MS CONTIN) 30 MG 12 hr tablet Take 1 tablet (30 mg total) by mouth 2 (two) times daily. May fill 60 days after 09/30/2017 60 tablet 0   Multiple Vitamins-Minerals (CENTRUM PO) Take 1  tablet by mouth daily.     ondansetron (ZOFRAN) 4 MG tablet Take 1 tablet (4 mg total) by mouth every 8 (eight) hours as needed for nausea or vomiting. 20 tablet 0   Oxycodone HCl 10 MG TABS Take 1 tablet by mouth 4 times a day as needed for pain. Do not drive or operate machinery while on this medicine. (Patient taking differently: Take 10 mg by mouth 4 (four) times daily as needed (pain). Do not drive or operate machinery while on this medicine.) 120 tablet 0   OZEMPIC, 2 MG/DOSE, 8 MG/3ML SOPN inject '2mg'$  SUBCUTANEOUSLY WEEKLY 3 mL 3   Potassium Citrate 15 MEQ (1620 MG) TBCR Take 1 tablet by mouth 2 (two) times daily. 180 tablet 3   promethazine (PHENERGAN) 25 MG tablet Take 0.5 tablets (12.5 mg total) by mouth every 6 (six) hours as needed for nausea or vomiting. (Patient taking differently: Take 25 mg by mouth every 6 (six) hours as needed for nausea or vomiting.) 30 tablet 1   sodium bicarbonate 650 MG tablet Take 1 tablet (650 mg total) by mouth 2 (two) times daily. 60 tablet 11   sulfamethoxazole-trimethoprim (BACTRIM DS) 800-160 MG tablet Take 1 tablet by mouth every 12 (twelve) hours. 28 tablet 0   tamsulosin (FLOMAX) 0.4 MG CAPS capsule Take 1 capsule (0.4 mg total) by mouth daily after  supper. 90 capsule 3   terazosin (HYTRIN) 10 MG capsule TAKE ONE CAPSULE BY MOUTH EVERY DAY 90 capsule 0   TRUE METRIX BLOOD GLUCOSE TEST test strip CHECK BLOOD SUGAR 4 TIMES DAILY. 100 each 0   TRUEPLUS INSULIN SYRINGE 31G X 5/16" 1 ML MISC AS DIRECTED 100 each 0   venlafaxine XR (EFFEXOR-XR) 37.5 MG 24 hr capsule Take 1 capsule (37.5 mg total) by mouth daily. 90 capsule 3   vitamin C (ASCORBIC ACID) 500 MG tablet Take 500 mg by mouth daily.     No current facility-administered medications for this visit.    Review of Systems: GENERAL: negative for malaise, night sweats HEENT: No changes in hearing or vision, no nose bleeds or other nasal problems. NECK: Negative for lumps, goiter, pain and significant neck swelling RESPIRATORY: Negative for cough, wheezing CARDIOVASCULAR: Negative for chest pain, leg swelling, palpitations, orthopnea GI: SEE HPI MUSCULOSKELETAL: Negative for joint pain or swelling, back pain, and muscle pain. SKIN: Negative for lesions, rash PSYCH: Negative for sleep disturbance, mood disorder and recent psychosocial stressors. HEMATOLOGY Negative for prolonged bleeding, bruising easily, and swollen nodes. ENDOCRINE: Negative for cold or heat intolerance, polyuria, polydipsia and goiter. NEURO: negative for tremor, gait imbalance, syncope and seizures. The remainder of the review of systems is noncontributory.   Physical Exam: BP 116/65 (BP Location: Left Arm, Patient Position: Sitting, Cuff Size: Large)   Pulse (!) 122   Temp 97.8 F (36.6 C) (Temporal)   Ht '5\' 7"'$  (1.702 m)   Wt 225 lb 14.4 oz (102.5 kg)   BMI 35.38 kg/m  GENERAL: The patient is AO x3, in no acute distress. HEENT: Head is normocephalic and atraumatic. EOMI are intact. Mouth is well hydrated and without lesions. NECK: Supple. No masses LUNGS: Clear to auscultation. No presence of rhonchi/wheezing/rales. Adequate chest expansion HEART: RRR, normal s1 and s2. ABDOMEN: Soft, nontender, no  guarding, no peritoneal signs, and nondistended. BS +. No masses. EXTREMITIES: Without any cyanosis, clubbing, rash, lesions or edema. NEUROLOGIC: AOx3, no focal motor deficit. SKIN: no jaundice, no rashes   Imaging/Labs: as above  I personally reviewed  and interpreted the available labs, imaging and endoscopic files.  Impression and Plan: Shaun Harris is a 57 y.o. male with PMH UC, stroke, anxiety, COPD, urolithiasis, DM, HLD, HTN, OSA, who presents for evaluation of ulcerative colitis.  The patient has a longstanding history of ulcerative colitis which has been managed with mesalamine compounds.  This has led to symptomatic remission.  Notably, his last colonoscopy showed very mild inflammatory changes, improved compared to prior.  He has not presented any other concerning symptoms otherwise.  Occasionally he has some episodes of constipation but these are infrequent.  Will continue him on mesalamine 400 mg every 6 hours.  I advised him to schedule a colonoscopy to evaluate endoscopic remission, but also for colorectal cancer screening given his long history of UC and pancolitis.  Patient understood and agreed.  If constipation were to worsen, he could start taking MiraLAX on a regular basis.  -Schedule colonoscopy -Continue with mesalamine 400 mg every 6 hours  All questions were answered.      Maylon Peppers, MD Gastroenterology and Hepatology [facility]

## 2022-09-16 NOTE — Telephone Encounter (Signed)
Pt returned call to schedule colonoscopy. Instructions printed and mailed to patient. Prep sent to pharmacy. Lab order placed.

## 2022-09-19 ENCOUNTER — Encounter: Payer: Self-pay | Admitting: Nurse Practitioner

## 2022-09-20 ENCOUNTER — Encounter: Payer: Self-pay | Admitting: Urology

## 2022-09-20 ENCOUNTER — Ambulatory Visit (INDEPENDENT_AMBULATORY_CARE_PROVIDER_SITE_OTHER): Payer: Medicare Other | Admitting: Urology

## 2022-09-20 ENCOUNTER — Ambulatory Visit (HOSPITAL_COMMUNITY)
Admission: RE | Admit: 2022-09-20 | Discharge: 2022-09-20 | Disposition: A | Discharge status: Home / Self Care | Payer: Medicare Other | Source: Ambulatory Visit | Attending: Urology | Admitting: Urology

## 2022-09-20 VITALS — BP 97/65 | HR 112

## 2022-09-20 DIAGNOSIS — N200 Calculus of kidney: Secondary | ICD-10-CM

## 2022-09-20 DIAGNOSIS — M5442 Lumbago with sciatica, left side: Secondary | ICD-10-CM | POA: Diagnosis not present

## 2022-09-20 LAB — URINALYSIS, ROUTINE W REFLEX MICROSCOPIC
Bilirubin, UA: NEGATIVE
Glucose, UA: NEGATIVE
Leukocytes,UA: NEGATIVE
Nitrite, UA: NEGATIVE
Protein,UA: NEGATIVE
RBC, UA: NEGATIVE
Specific Gravity, UA: 1.02 (ref 1.005–1.030)
Urobilinogen, Ur: 0.2 mg/dL (ref 0.2–1.0)
pH, UA: 5.5 (ref 5.0–7.5)

## 2022-09-20 MED ORDER — CYCLOBENZAPRINE HCL 5 MG PO TABS: 5.0000 mg | ORAL_TABLET | Freq: Three times a day (TID) | ORAL | 1 refills | Status: DC | PRN

## 2022-09-20 NOTE — Progress Notes (Signed)
$'@ENCDATE'N$ @ 12:15 PM   Shaun Harris Mar 06, 1966 [id number]  Referring provider: Coral Spikes, DO [place],   [id number]  Left flank pain   HPI: Shaun Harris is a 57yo here for followup for left flank pain and nephrolithiasis. UA today is clear., KUB from today shows no calculi. He has left flank pain which is worse with standing from a sitting position. The pain improves after several minutes of standing.    PMH: Past Medical History:  Diagnosis Date   Anxiety    Arthritis    Cerebral vasculitis    Chronic headaches    constant since possible stroke in 1997,"Vasculitis of brain with increased pressure of brain ".   COPD (chronic obstructive pulmonary disease) (HCC)    COPD (chronic obstructive pulmonary disease) (HCC)    Diabetes mellitus    Hyperlipidemia    Hypertension    Hypertriglyceridemia    Sleep apnea    has but doesnt use, cannot tolerate; PCP aware   Stroke [facility])    pt states,"they arent sure if i had a stroke in 1997, there is still a question about that with my doctors". No deficits.   Tobacco abuse    Ulcerative colitis ([place])    Ulcerative colitis ([place])    Venous stasis     Surgical History: Past Surgical History:  Procedure Laterality Date   BIOPSY  10/25/2016   Procedure: BIOPSY;  Surgeon: Rogene Houston, MD;  Location: [facility];  Service: Endoscopy;;  colon   CERVICAL SPINE SURGERY  2008   x2-last surgery 04/24/2016.   COLONOSCOPY WITH PROPOFOL N/A 10/25/2016   Procedure: COLONOSCOPY WITH PROPOFOL;  Surgeon: Rogene Houston, MD;  Location: [facility];  Service: Endoscopy;  Laterality: N/A;  7:30   EXTRACORPOREAL SHOCK WAVE LITHOTRIPSY Left 08/08/2020   Procedure: EXTRACORPOREAL SHOCK WAVE LITHOTRIPSY (ESWL);  Surgeon: Cleon Gustin, MD;  Location: [facility] ORS;  Service: Urology;  Laterality: Left;   KNEE ARTHROSCOPY  02/25/2012   Procedure: ARTHROSCOPY KNEE;  Surgeon: Sanjuana Kava, MD;  Location: [facility] OR[facility];  Service:  Orthopedics;  Laterality: Right;   KNEE ARTHROSCOPY WITH MEDIAL MENISECTOMY Left 12/23/2014   Procedure: LEFT KNEE ARTHROSCOPY WITH PARTIAL MENISECTOMY;  Surgeon: Sanjuana Kava, MD;  Location: [facility];  Service: Orthopedics;  Laterality: Left;   POLYPECTOMY  10/25/2016   Procedure: POLYPECTOMY;  Surgeon: Rogene Houston, MD;  Location: [facility];  Service: Endoscopy;;  colon   SHOULDER ARTHROSCOPY  2004   left shoulder   SHOULDER ARTHROSCOPY  2008   right shoulder    Home Medications:  Allergies as of 09/20/2022       Reactions   Gabapentin Other (See Comments)   Increased blood sugar        Medication List        Accurate as of September 20, 2022 12:15 PM. If you have any questions, ask your nurse or doctor.          acetaminophen 500 MG tablet Commonly known as: TYLENOL Take 1,000 mg by mouth every 6 (six) hours as needed for moderate pain.   Aimovig 70 MG/ML Soaj Generic drug: Erenumab-aooe Inject 70 mg into the skin every 30 (thirty) days.   albuterol 108 (90 Base) MCG/ACT inhaler Commonly known as: VENTOLIN HFA INHALE 2 PUFFS BY MOUTH EVERY 6 HOURS AS NEEDED FOR SHORTNESS OF BREATH.   ALPRAZolam 0.5 MG tablet Commonly known as: XANAX Take 0.5 mg by mouth 2 (two)  times daily.   ascorbic acid 500 MG tablet Commonly known as: VITAMIN C Take 500 mg by mouth daily.   aspirin EC 325 MG tablet Take 325 mg by mouth at bedtime.   atorvastatin 20 MG tablet Commonly known as: LIPITOR TAKE ONE TABLET BY MOUTH EVERY DAY   Blood Glucose Monitoring Suppl w/Device Kit Test glucose 4 times daily. E11.9   buPROPion 150 MG 12 hr tablet Commonly known as: WELLBUTRIN SR TAKE ONE TABLET BY MOUTH TWICE DAILY   CENTRUM PO Take 1 tablet by mouth daily.   enalapril 20 MG tablet Commonly known as: VASOTEC Take 1 tablet (20 mg total) by mouth 2 (two) times daily.   fluticasone-salmeterol 250-50 MCG/ACT Aepb Commonly known as: Advair Diskus USE 1 INHALATION BY MOUTH EVERY  12 HOURS. <<RINSE MOUTH AFTER USE>>.   furosemide 20 MG tablet Commonly known as: LASIX TAKE ONE TABLET BY MOUTH EVERY DAY   HYDROmorphone 2 MG tablet Commonly known as: Dilaudid Take 1 tablet (2 mg total) by mouth every 4 (four) hours as needed for severe pain.   hydrOXYzine 50 MG tablet Commonly known as: ATARAX Take 50 mg by mouth daily as needed for anxiety.   insulin aspart 100 UNIT/ML injection Commonly known as: NovoLOG Inject 15 Units into the skin 3 (three) times daily with meals.   levETIRAcetam 500 MG tablet Commonly known as: KEPPRA TAKE 3 TABLETS BY MOUTH TWICE DAILY.   Mesalamine 400 MG Cpdr DR capsule Commonly known as: ASACOL TAKE ONE CAPSULE BY MOUTH FOUR TIMES DAILY   morphine 30 MG 12 hr tablet Commonly known as: MS CONTIN Take 1 tablet (30 mg total) by mouth 2 (two) times daily. May fill 60 days after 09/30/2017   ondansetron 4 MG tablet Commonly known as: Zofran Take 1 tablet (4 mg total) by mouth every 8 (eight) hours as needed for nausea or vomiting.   Oxycodone HCl 10 MG Tabs Take 1 tablet by mouth 4 times a day as needed for pain. Do not drive or operate machinery while on this medicine. What changed:  how much to take how to take this when to take this reasons to take this additional instructions   Ozempic (2 MG/DOSE) 8 MG/3ML Sopn Generic drug: Semaglutide (2 MG/DOSE) inject '2mg'$  SUBCUTANEOUSLY WEEKLY   Potassium Citrate 15 MEQ (1620 MG) Tbcr Take 1 tablet by mouth 2 (two) times daily.   promethazine 25 MG tablet Commonly known as: PHENERGAN Take 0.5 tablets (12.5 mg total) by mouth every 6 (six) hours as needed for nausea or vomiting. What changed: how much to take   sodium bicarbonate 650 MG tablet Take 1 tablet (650 mg total) by mouth 2 (two) times daily.   sulfamethoxazole-trimethoprim 800-160 MG tablet Commonly known as: BACTRIM DS Take 1 tablet by mouth every 12 (twelve) hours.   tamsulosin 0.4 MG Caps capsule Commonly  known as: FLOMAX Take 1 capsule (0.4 mg total) by mouth daily after supper.   terazosin 10 MG capsule Commonly known as: HYTRIN TAKE ONE CAPSULE BY MOUTH EVERY DAY   Tresiba FlexTouch 200 UNIT/ML FlexTouch Pen Generic drug: insulin degludec Inject 120 Units into the skin daily.   True Metrix Blood Glucose Test test strip Generic drug: glucose blood CHECK BLOOD SUGAR 4 TIMES DAILY.   TRUEplus Insulin Syringe 31G X 5/16" 1 ML Misc Generic drug: Insulin Syringe-Needle U-100 AS DIRECTED   venlafaxine XR 37.5 MG 24 hr capsule Commonly known as: EFFEXOR-XR Take 1 capsule (37.5 mg total) by mouth  daily.        Allergies:  Allergies  Allergen Reactions   Gabapentin Other (See Comments)    Increased blood sugar    Family History: Family History  Problem Relation Age of Onset   Hypertension Father    Diabetes Father    Heart disease Father    Heart disease Maternal Grandfather    Heart attack Other    Heart disease Brother        both brothers have heart disease   Hypertension Sister     Social History:  reports that he has been smoking cigarettes. He has a 35.00 pack-year smoking history. He has never used smokeless tobacco. He reports that he does not drink alcohol and does not use drugs.  ROS: All other review of systems were reviewed and are negative except what is noted above in HPI  Physical Exam: BP 97/65   Pulse (!) 112   Constitutional:  Alert and oriented, No acute distress. HEENT: [place] AT, moist mucus membranes.  Trachea midline, no masses. Cardiovascular: No clubbing, cyanosis, or edema. Respiratory: Normal respiratory effort, no increased work of breathing. GI: Abdomen is soft, nontender, nondistended, no abdominal masses GU: No CVA tenderness.  Lymph: No cervical or inguinal lymphadenopathy. Skin: No rashes, bruises or suspicious lesions. Neurologic: Grossly intact, no focal deficits, moving all 4 extremities. Psychiatric: Normal mood and  affect.  Laboratory Data: Lab Results  Component Value Date   WBC 7.5 10/01/2021   HGB 12.9 (L) 10/01/2021   HCT 37.8 10/01/2021   MCV 93 10/01/2021   PLT 238 10/01/2021    Lab Results  Component Value Date   CREATININE 1.36 (H) 10/01/2021    No results found for: "PSA"  No results found for: "TESTOSTERONE"  Lab Results  Component Value Date   HGBA1C 7.7 (H) 07/03/2022    Urinalysis    Component Value Date/Time   COLORURINE YELLOW 07/21/2020 2356   APPEARANCEUR Clear 06/25/2021 1543   LABSPEC 1.020 07/21/2020 2356   PHURINE 6.0 07/21/2020 2356   GLUCOSEU Negative 06/25/2021 1543   HGBUR SMALL (A) 07/21/2020 2356   BILIRUBINUR Negative 06/25/2021 1543   KETONESUR >80 (A) 07/21/2020 2356   PROTEINUR Negative 06/25/2021 1543   PROTEINUR NEGATIVE 07/21/2020 2356   UROBILINOGEN 0.2 12/21/2014 0845   NITRITE Negative 06/25/2021 1543   NITRITE NEGATIVE 07/21/2020 2356   LEUKOCYTESUR Negative 06/25/2021 1543   LEUKOCYTESUR NEGATIVE 07/21/2020 2356    Lab Results  Component Value Date   LABMICR <3.0 07/03/2022   [place] None seen 07/25/2020   LABEPIT None seen 07/25/2020   MUCUS Present 07/25/2020   BACTERIA Few 07/25/2020    Pertinent Imaging: KUb today: Images reviewed and discussed with the patient  Results for orders placed during the hospital encounter of 12/26/21  Abdomen 1 view (KUB)  Narrative CLINICAL DATA:  Follow-up kidney stone.  EXAM: ABDOMEN - 1 VIEW  COMPARISON:  June 25, 2021  FINDINGS: The bowel gas pattern is normal. 7 mm stone is persistent in the lower pole right kidney. There is a question 2 mm stone in the lower pole left kidney. Stable pelvic phleboliths are unchanged.  IMPRESSION: 7 mm stone is persistent in the lower pole right kidney. There is a question 2 mm stone in the lower pole left kidney.   Electronically Signed By: Abelardo Diesel M.D. On: 12/27/2021 09:52  No results found for this or any previous  visit.  No results found for this or any previous visit.  No  results found for this or any previous visit.  Results for orders placed during the hospital encounter of 12/18/20  Ultrasound renal complete  Narrative CLINICAL DATA:  Nephrolithiasis.  EXAM: RENAL / URINARY TRACT ULTRASOUND COMPLETE  COMPARISON:  September 14, 2020.  FINDINGS: Right Kidney:  Renal measurements: 11.9 x 6.0 x 6.3 cm = volume: 239 mL. Probable 12 mm nonobstructive calculus seen in midpole collecting system. Echogenicity within normal limits. No mass or hydronephrosis visualized.  Left Kidney:  Renal measurements: 13.4 x 5.7 x 5.6 cm = volume: 224 mL. Probable nonobstructive 7 mm calculus seen in midpole collecting system. Echogenicity within normal limits. No mass or hydronephrosis visualized.  Bladder:  Appears normal for degree of bladder distention. Bilateral ureteral jets are noted.  Other:  None.  IMPRESSION: Probable bilateral nonobstructive nephrolithiasis. No hydronephrosis or renal obstruction is noted.   Electronically Signed By: Marijo Conception M.D. On: 12/18/2020 13:39  No valid procedures specified. No results found for this or any previous visit.  Results for orders placed in visit on 09/11/22  CT RENAL STONE STUDY  Narrative CLINICAL DATA:  Bilateral flank pain, history of calculi  EXAM: CT ABDOMEN AND PELVIS WITHOUT CONTRAST  TECHNIQUE: Multidetector CT imaging of the abdomen and pelvis was performed following the standard protocol without IV contrast.  RADIATION DOSE REDUCTION: This exam was performed according to the departmental dose-optimization program which includes automated exposure control, adjustment of the mA and/or kV according to patient size and/or use of iterative reconstruction technique.  COMPARISON:  09/14/2020 and radiographs from 12/26/2021  FINDINGS: Lower chest: Unremarkable  Hepatobiliary: Unremarkable  Pancreas:  Unremarkable  Spleen: Unremarkable  Adrenals/Urinary Tract: Stable 3.7 by 4.1 cm right adrenal myelolipoma, image 19 series 2. No signs of interval hematoma. This lesion is benign and does not require further imaging workup.  Cluster calculi in the right kidney lower pole measuring up to 1.1 cm in long axis. There is also a 2 mm right kidney upper pole nonobstructive renal calculus.  Two 2 mm left renal calculi are present.  No hydronephrosis or hydroureter.  No ureteral or bladder calculus.  Stomach/Bowel: Unremarkable.  Normal appendix.  Vascular/Lymphatic: Atherosclerosis is present, including aortoiliac atherosclerotic disease.  Reproductive: Borderline prostatomegaly.  Other: No supplemental non-categorized findings.  Musculoskeletal: Mild lumbar spondylosis and degenerative disc disease.  IMPRESSION: 1. Bilateral nonobstructive nephrolithiasis. 2. Stable 4.1 cm right adrenal myelolipoma. This lesion is benign and does not require further imaging workup. 3. Borderline prostatomegaly. 4. Mild lumbar spondylosis and degenerative disc disease. 5. Aortic atherosclerosis.  Aortic Atherosclerosis (ICD10-I70.0).   Electronically Signed By: Van Clines M.D. On: 09/11/2022 15:04   Assessment & Plan:    1. Nephrolithiasis -followup 6 months with renal US - Urinalysis, Routine w reflex microscopic  2. Acute midline low back pain with left-sided sciatica -flexeril '5mg'$  TID prn   No follow-ups on file.  Nicolette Bang, MD  [facility] [place]

## 2022-09-20 NOTE — Patient Instructions (Addendum)
Acute Back Pain, Adult Acute back pain is sudden and usually short-lived. It is often caused by an injury to the muscles and tissues in the back. The injury may result from: A muscle, tendon, or ligament getting overstretched or torn. Ligaments are tissues that connect bones to each other. Lifting something improperly can cause a back strain. Wear and tear (degeneration) of the spinal disks. Spinal disks are circular tissue that provide cushioning between the bones of the spine (vertebrae). Twisting motions, such as while playing sports or doing yard work. A hit to the back. Arthritis. You may have a physical exam, lab tests, and imaging tests to find the cause of your pain. Acute back pain usually goes away with rest and home care. Follow these instructions at home: Managing pain, stiffness, and swelling Take over-the-counter and prescription medicines only as told by your health care provider. Treatment may include medicines for pain and inflammation that are taken by mouth or applied to the skin, or muscle relaxants. Your health care provider may recommend applying ice during the first 24-48 hours after your pain starts. To do this: Put ice in a plastic bag. Place a towel between your skin and the bag. Leave the ice on for 20 minutes, 2-3 times a day. Remove the ice if your skin turns bright red. This is very important. If you cannot feel pain, heat, or cold, you have a greater risk of damage to the area. If directed, apply heat to the affected area as often as told by your health care provider. Use the heat source that your health care provider recommends, such as a moist heat pack or a heating pad. Place a towel between your skin and the heat source. Leave the heat on for 20-30 minutes. Remove the heat if your skin turns bright red. This is especially important if you are unable to feel pain, heat, or cold. You have a greater risk of getting burned. Activity  Do not stay in bed. Staying in  bed for more than 1-2 days can delay your recovery. Sit up and stand up straight. Avoid leaning forward when you sit or hunching over when you stand. If you work at a desk, sit close to it so you do not need to lean over. Keep your chin tucked in. Keep your neck drawn back, and keep your elbows bent at a 90-degree angle (right angle). Sit high and close to the steering wheel when you drive. Add lower back (lumbar) support to your car seat, if needed. Take short walks on even surfaces as soon as you are able. Try to increase the length of time you walk each day. Do not sit, drive, or stand in one place for more than 30 minutes at a time. Sitting or standing for long periods of time can put stress on your back. Do not drive or use heavy machinery while taking prescription pain medicine. Use proper lifting techniques. When you bend and lift, use positions that put less stress on your back: Bend your knees. Keep the load close to your body. Avoid twisting. Exercise regularly as told by your health care provider. Exercising helps your back heal faster and helps prevent back injuries by keeping muscles strong and flexible. Work with a physical therapist to make a safe exercise program, as recommended by your health care provider. Do any exercises as told by your physical therapist. Lifestyle Maintain a healthy weight. Extra weight puts stress on your back and makes it difficult to have good   posture. Avoid activities or situations that make you feel anxious or stressed. Stress and anxiety increase muscle tension and can make back pain worse. Learn ways to manage anxiety and stress, such as through exercise. General instructions Sleep on a firm mattress in a comfortable position. Try lying on your side with your knees slightly bent. If you lie on your back, put a pillow under your knees. Keep your head and neck in a straight line with your spine (neutral position) when using electronic equipment like  smartphones or pads. To do this: Raise your smartphone or pad to look at it instead of bending your head or neck to look down. Put the smartphone or pad at the level of your face while looking at the screen. Follow your treatment plan as told by your health care provider. This may include: Cognitive or behavioral therapy. Acupuncture or massage therapy. Meditation or yoga. Contact a health care provider if: You have pain that is not relieved with rest or medicine. You have increasing pain going down into your legs or buttocks. Your pain does not improve after 2 weeks. You have pain at night. You lose weight without trying. You have a fever or chills. You develop nausea or vomiting. You develop abdominal pain. Get help right away if: You develop new bowel or bladder control problems. You have unusual weakness or numbness in your arms or legs. You feel faint. These symptoms may represent a serious problem that is an emergency. Do not wait to see if the symptoms will go away. Get medical help right away. Call your local emergency services (911 in the [place] Do not drive yourself to the hospital. Summary Acute back pain is sudden and usually short-lived. Use proper lifting techniques. When you bend and lift, use positions that put less stress on your back. Take over-the-counter and prescription medicines only as told by your health care provider, and apply heat or ice as told. This information is not intended to replace advice given to you by your health care provider. Make sure you discuss any questions you have with your health care provider. Document Revised: 09/22/2020 Document Reviewed: 09/22/2020 Elsevier Patient Education  2023 Elsevier Inc.  

## 2022-10-02 ENCOUNTER — Telehealth (INDEPENDENT_AMBULATORY_CARE_PROVIDER_SITE_OTHER): Payer: Self-pay | Admitting: Gastroenterology

## 2022-10-02 ENCOUNTER — Other Ambulatory Visit (HOSPITAL_COMMUNITY)
Admission: RE | Admit: 2022-10-02 | Discharge: 2022-10-02 | Disposition: A | Discharge status: Home / Self Care | Payer: Medicare Other | Source: Ambulatory Visit | Attending: Family Medicine | Admitting: Family Medicine

## 2022-10-02 ENCOUNTER — Other Ambulatory Visit (INDEPENDENT_AMBULATORY_CARE_PROVIDER_SITE_OTHER): Payer: Self-pay

## 2022-10-02 ENCOUNTER — Ambulatory Visit (INDEPENDENT_AMBULATORY_CARE_PROVIDER_SITE_OTHER): Payer: Medicare Other | Admitting: Family Medicine

## 2022-10-02 ENCOUNTER — Other Ambulatory Visit (HOSPITAL_COMMUNITY)
Admission: RE | Admit: 2022-10-02 | Discharge: 2022-10-02 | Disposition: A | Discharge status: Home / Self Care | Payer: Medicare Other | Source: Ambulatory Visit | Attending: Gastroenterology | Admitting: Gastroenterology

## 2022-10-02 ENCOUNTER — Encounter: Payer: Self-pay | Admitting: Neurology

## 2022-10-02 DIAGNOSIS — E875 Hyperkalemia: Secondary | ICD-10-CM

## 2022-10-02 DIAGNOSIS — N1831 Chronic kidney disease, stage 3a: Secondary | ICD-10-CM

## 2022-10-02 DIAGNOSIS — Z794 Long term (current) use of insulin: Secondary | ICD-10-CM

## 2022-10-02 DIAGNOSIS — E1122 Type 2 diabetes mellitus with diabetic chronic kidney disease: Secondary | ICD-10-CM | POA: Diagnosis not present

## 2022-10-02 DIAGNOSIS — E785 Hyperlipidemia, unspecified: Secondary | ICD-10-CM | POA: Insufficient documentation

## 2022-10-02 DIAGNOSIS — I10 Essential (primary) hypertension: Secondary | ICD-10-CM | POA: Diagnosis not present

## 2022-10-02 DIAGNOSIS — N400 Enlarged prostate without lower urinary tract symptoms: Secondary | ICD-10-CM

## 2022-10-02 DIAGNOSIS — R251 Tremor, unspecified: Secondary | ICD-10-CM | POA: Insufficient documentation

## 2022-10-02 DIAGNOSIS — Z1211 Encounter for screening for malignant neoplasm of colon: Secondary | ICD-10-CM | POA: Diagnosis present

## 2022-10-02 HISTORY — DX: Tremor, unspecified: R25.1

## 2022-10-02 LAB — BASIC METABOLIC PANEL
Anion gap: 9 (ref 5–15)
BUN: 42 mg/dL — ABNORMAL HIGH (ref 6–20)
CO2: 20 mmol/L — ABNORMAL LOW (ref 22–32)
Calcium: 9.2 mg/dL (ref 8.9–10.3)
Chloride: 109 mmol/L (ref 98–111)
Creatinine, Ser: 1.93 mg/dL — ABNORMAL HIGH (ref 0.61–1.24)
GFR, Estimated: 40 mL/min — ABNORMAL LOW (ref >60)
Glucose, Bld: 161 mg/dL — ABNORMAL HIGH (ref 70–99)
Potassium: 5.3 mmol/L — ABNORMAL HIGH (ref 3.5–5.1)
Sodium: 138 mmol/L (ref 135–145)

## 2022-10-02 LAB — CBC
HCT: 36.8 % — ABNORMAL LOW (ref 39.0–52.0)
Hemoglobin: 12.2 g/dL — ABNORMAL LOW (ref 13.0–17.0)
MCH: 30.1 pg (ref 26.0–34.0)
MCHC: 33.2 g/dL (ref 30.0–36.0)
MCV: 90.9 fL (ref 80.0–100.0)
Platelets: 289 10*3/uL (ref 150–400)
RBC: 4.05 MIL/uL — ABNORMAL LOW (ref 4.22–5.81)
RDW: 12.4 % (ref 11.5–15.5)
WBC: 7.6 10*3/uL (ref 4.0–10.5)
nRBC: 0 % (ref 0.0–0.2)

## 2022-10-02 LAB — LIPID PANEL
Cholesterol: 155 mg/dL (ref 0–200)
HDL: 35 mg/dL — ABNORMAL LOW (ref >40)
LDL Cholesterol: 52 mg/dL (ref 0–99)
Total CHOL/HDL Ratio: 4.4 RATIO
Triglycerides: 340 mg/dL — ABNORMAL HIGH (ref <150)
VLDL: 68 mg/dL — ABNORMAL HIGH (ref 0–40)

## 2022-10-02 NOTE — Assessment & Plan Note (Signed)
Stable.  Stopping terazosin.  Continue Flomax.

## 2022-10-02 NOTE — Telephone Encounter (Signed)
Pt left voicemail in regards to where to pick prep up at.  Contacted pt and advised that prep was sent to [facility]. Advised pt to let me know if they do not have it.

## 2022-10-02 NOTE — Progress Notes (Signed)
Subjective:  Patient ID: Shaun Harris, male    DOB: April 09, 1966  Age: 57 y.o. MRN: [id number]  CC: Chief Complaint  Patient presents with   Diabetes    HPI:  57 year old male with an extensive past medical history presents for follow-up.  Blood pressure low today.  He is currently on enalapril and Lasix.  He is also on both Flomax and terazosin.  Will discuss with urology.  I recommend discontinuing the terazosin.  Patient states that his sugars are uncontrolled.  Have been in 300s lately.  Needs A1c today. Has upcoming Endo appt. Medications - Ozempic 2 mg weekly, Tyler Aas, NovoLog.  Patient reports that he has had ongoing tremor particularly of his left hand.  He states that he had a father with Parkinson's disease and he is concerned about this.  He states that it is severe at times.  No new medications.  He does not feel like this is a medication side effect.  The alprazolam that he takes does not seem to have a significant beneficial effect.  Patient Active Problem List   Diagnosis Date Noted   Tremor 10/02/2022   Migraine 04/04/2022   BPH (benign prostatic hyperplasia) 01/01/2022   Ulcerative colitis ([place]) 07/02/2021   Type 2 diabetes mellitus with diabetic chronic kidney disease ([place]) 07/02/2021   History of seizure 03/14/2021   Stage 3 chronic kidney disease ([place]) 02/06/2021   Nephrolithiasis 07/25/2020   COPD (chronic obstructive pulmonary disease) ([place]) 03/10/2013   Tobacco abuse 03/10/2013   Essential hypertension 10/01/2012   Hyperlipidemia 10/01/2012   Chronic pain syndrome 10/01/2012    Social Hx   Social History   Socioeconomic History   Marital status: Widowed    Spouse name: Not on file   Number of children: Not on file   Years of education: Not on file   Highest education level: Not on file  Occupational History   Not on file  Tobacco Use   Smoking status: Every Day    Packs/day: 1.00    Years: 35.00    Additional pack years: 0.00    Total pack  years: 35.00    Types: Cigarettes   Smokeless tobacco: Never  Vaping Use   Vaping Use: Never used  Substance and Sexual Activity   Alcohol use: No   Drug use: No   Sexual activity: Yes    Birth control/protection: None  Other Topics Concern   Not on file  Social History Narrative   Not on file   Social Determinants of Health   Financial Resource Strain: Not on file  Food Insecurity: Not on file  Transportation Needs: Not on file  Physical Activity: Not on file  Stress: Not on file  Social Connections: Not on file    Review of Systems Per HPI  Objective:  BP 93/61   Pulse (!) 120   Temp 98.6 F (37 C)   Ht 5\' 7"  (1.702 m)   Wt 220 lb (99.8 kg)   SpO2 98%   BMI 34.46 kg/m      10/02/2022   10:09 AM 09/20/2022   11:51 AM 09/16/2022    8:58 AM  BP/Weight  Systolic BP 93 97 [id number]  Diastolic BP 61 65 65  Wt. (Lbs) 220  225.9  BMI 34.46 kg/m2  35.38 kg/m2    Physical Exam Vitals and nursing note reviewed.  Constitutional:      General: He is not in acute distress.    Appearance: He is obese.  HENT:  Head: Normocephalic and atraumatic.  Eyes:     General:        Right eye: No discharge.        Left eye: No discharge.     Conjunctiva/sclera: Conjunctivae normal.  Cardiovascular:     Rate and Rhythm: Normal rate and regular rhythm.  Pulmonary:     Effort: Pulmonary effort is normal.     Breath sounds: No wheezing.  Neurological:     Mental Status: He is alert.     Comments: Tremor of the left hand noted with use of the left hand.  No tremor at rest.  Psychiatric:        Mood and Affect: Mood normal.        Behavior: Behavior normal.     Lab Results  Component Value Date   WBC 7.6 10/02/2022   HGB 12.2 (L) 10/02/2022   HCT 36.8 (L) 10/02/2022   PLT 289 10/02/2022   GLUCOSE 161 (H) 10/02/2022   CHOL 155 10/02/2022   TRIG 340 (H) 10/02/2022   HDL 35 (L) 10/02/2022   LDLCALC 52 10/02/2022   ALT 17 10/01/2021   AST 14 10/01/2021   NA 138  10/02/2022   K 5.3 (H) 10/02/2022   CL 109 10/02/2022   CREATININE 1.93 (H) 10/02/2022   BUN 42 (H) 10/02/2022   CO2 20 (L) 10/02/2022   HGBA1C 7.7 (H) 07/03/2022     Assessment & Plan:   Problem List Items Addressed This Visit       Cardiovascular and Mediastinum   Essential hypertension    BP 93/61 today.  I reached out to urology.  Urology is okay with me discontinuing terazosin. Medication was discontinued today.        Endocrine   Type 2 diabetes mellitus with diabetic chronic kidney disease ([place])    Patient's insulin requirement is high and his blood sugars are uncontrolled.  I am deferring to endocrinology regarding his management.  Awaiting upcoming appointment.      Relevant Orders   Hemoglobin A1c     Genitourinary   Stage 3 chronic kidney disease (HCC)   Relevant Orders   CBC   BPH (benign prostatic hyperplasia)    Stable.  Stopping terazosin.  Continue Flomax.        Other   Hyperlipidemia   Relevant Orders   Lipid panel   Tremor    Referring to neurology for formal evaluation.      Relevant Orders   Ambulatory referral to Neurology    Follow-up:  Return in about 3 months (around 01/02/2023).  [place]

## 2022-10-02 NOTE — Assessment & Plan Note (Signed)
BP 93/61 today.  I reached out to urology.  Urology is okay with me discontinuing terazosin. Medication was discontinued today.

## 2022-10-02 NOTE — Assessment & Plan Note (Signed)
Referring to neurology for formal evaluation.

## 2022-10-02 NOTE — Patient Instructions (Addendum)
Labs today.  Stop the Terazosin.   Be sure to stay hydrated.  I don't believe the tremor is from Parkinson's.  Will continue to monitor.  Follow up in 3 months.

## 2022-10-02 NOTE — Assessment & Plan Note (Signed)
Patient's insulin requirement is high and his blood sugars are uncontrolled.  I am deferring to endocrinology regarding his management.  Awaiting upcoming appointment.

## 2022-10-03 LAB — HEMOGLOBIN A1C
Hgb A1c MFr Bld: 9 % — ABNORMAL HIGH (ref 4.8–5.6)
Mean Plasma Glucose: 212 mg/dL

## 2022-10-05 LAB — BASIC METABOLIC PANEL
BUN/Creatinine Ratio: 21 — ABNORMAL HIGH (ref 9–20)
BUN: 34 mg/dL — ABNORMAL HIGH (ref 6–24)
CO2: 21 mmol/L (ref 20–29)
Calcium: 9.8 mg/dL (ref 8.7–10.2)
Chloride: 104 mmol/L (ref 96–106)
Creatinine, Ser: 1.61 mg/dL — ABNORMAL HIGH (ref 0.76–1.27)
Glucose: 243 mg/dL — ABNORMAL HIGH (ref 70–99)
Potassium: 5.5 mmol/L — ABNORMAL HIGH (ref 3.5–5.2)
Sodium: 140 mmol/L (ref 134–144)
eGFR: 50 mL/min/{1.73_m2} — ABNORMAL LOW (ref >59)

## 2022-10-07 ENCOUNTER — Telehealth: Payer: Self-pay | Admitting: *Deleted

## 2022-10-07 ENCOUNTER — Other Ambulatory Visit: Payer: Self-pay | Admitting: *Deleted

## 2022-10-07 ENCOUNTER — Telehealth: Payer: Self-pay | Admitting: Gastroenterology

## 2022-10-07 ENCOUNTER — Ambulatory Visit (INDEPENDENT_AMBULATORY_CARE_PROVIDER_SITE_OTHER): Payer: Medicare Other | Admitting: Gastroenterology

## 2022-10-07 ENCOUNTER — Encounter: Payer: Self-pay | Admitting: Gastroenterology

## 2022-10-07 VITALS — BP 113/79 | HR 96 | Temp 98.0°F | Ht 67.0 in | Wt 223.2 lb

## 2022-10-07 DIAGNOSIS — K5190 Ulcerative colitis, unspecified, without complications: Secondary | ICD-10-CM

## 2022-10-07 DIAGNOSIS — K645 Perianal venous thrombosis: Secondary | ICD-10-CM

## 2022-10-07 NOTE — Addendum Note (Signed)
Addended by: Madelin Rear on: 10/07/2022 03:50 PM   Modules accepted: Orders

## 2022-10-07 NOTE — Patient Instructions (Addendum)
You have a thrombosed hemorrhoid on exam. These can be very painful and you can get relief with lancing, this is typically done by general surgery in their office under local anesthesia. We will arrange appointment. I will let Dr. Jenetta Downer know we need to reschedule your colonoscopy.

## 2022-10-07 NOTE — Telephone Encounter (Signed)
Shaun Harris, Patient seen in the office today for same day appt for rectal pain. He has thrombosed hemorrhoid on exam and trying to have general surgery see him asap for lancing.   He is unable to prep right now due to rectal pain. Please cancel his colonoscopy. Consult with Dr. Jenetta Downer regarding when to reschedule.

## 2022-10-07 NOTE — Telephone Encounter (Signed)
Noted. Message sent to endo to cancel. Looks like tammy already sent referral to surgery

## 2022-10-07 NOTE — Progress Notes (Signed)
GI Office Note    Referring Provider: Coral Spikes, DO Primary Care Physician:  Coral Spikes, DO  Primary Gastroenterologist: Dr. Jenetta Downer  Chief Complaint   Chief Complaint  Patient presents with   Rectal Pain    Thinks there is an abscess with a possible second abscess forming near his rectum. Has colonoscopy scheduled for this coming Wednesday.    History of Present Illness   Shaun Harris is a 57 y.o. male presenting today for same day appointment for rectal pain. Last seen 09/16/22 by Dr. Jenetta Downer for UC, scheduled for colonoscopy 10/09/22.   Patient states last night he noted throbbing in the rectal area. Very uncomfortable. Woke up this morning and had swelling at anal opening very tender and feels like a second area around the rectum started to form as well. He typically has 3-4 soft/runny stools daily. No brbpr. No abdominal pain. He is worried about having colonoscopy and doing bowel prep tomorrow with having significant rectal pain.   Last Colonoscopy:09/2016 Exam performed to the cecum. No evidence of active colitis. Patchy edema at the sigmoid colon and rectum. Biopsies taken. Diagnosis: colon ascending colon: focal active acute inflammation. Colon descending: Mild increase in chronic inflammation. Colon sigmoid and rectum: chronic changes changes but significant inflammation. Hyperplastic polyp.     Medications   Current Outpatient Medications  Medication Sig Dispense Refill   acetaminophen (TYLENOL) 500 MG tablet Take 1,000 mg by mouth every 6 (six) hours as needed for moderate pain.     AIMOVIG 70 MG/ML SOAJ Inject 70 mg into the skin every 30 (thirty) days.     albuterol (VENTOLIN HFA) 108 (90 Base) MCG/ACT inhaler INHALE 2 PUFFS BY MOUTH EVERY 6 HOURS AS NEEDED FOR SHORTNESS OF BREATH. 18 g 5   ALPRAZolam (XANAX) 0.25 MG tablet Take 0.25 mg by mouth 3 (three) times daily.     aspirin EC 325 MG tablet Take 325 mg by mouth at bedtime.     atorvastatin  (LIPITOR) 20 MG tablet TAKE ONE TABLET BY MOUTH EVERY DAY 90 tablet 0   Blood Glucose Monitoring Suppl w/Device KIT Test glucose 4 times daily. E11.9 100 each 5   buPROPion (WELLBUTRIN SR) 150 MG 12 hr tablet TAKE ONE TABLET BY MOUTH TWICE DAILY 180 tablet 1   cyclobenzaprine (FLEXERIL) 5 MG tablet Take 1 tablet (5 mg total) by mouth 3 (three) times daily as needed for muscle spasms. 30 tablet 1   enalapril (VASOTEC) 20 MG tablet Take 1 tablet (20 mg total) by mouth 2 (two) times daily. 180 tablet 1   fluticasone-salmeterol (ADVAIR DISKUS) 250-50 MCG/ACT AEPB USE 1 INHALATION BY MOUTH EVERY 12 HOURS. <<RINSE MOUTH AFTER USE>>. 180 each 1   furosemide (LASIX) 20 MG tablet TAKE ONE TABLET BY MOUTH EVERY DAY 90 tablet 1   hydrOXYzine (ATARAX/VISTARIL) 50 MG tablet Take 50 mg by mouth daily as needed for anxiety.     insulin aspart (NOVOLOG) 100 UNIT/ML injection Inject 15 Units into the skin 3 (three) times daily with meals. 30 mL 3   insulin degludec (TRESIBA FLEXTOUCH) 200 UNIT/ML FlexTouch Pen Inject 120 Units into the skin daily. (Patient taking differently: Inject 60 Units into the skin in the morning and at bedtime.) 18 mL 11   levETIRAcetam (KEPPRA) 500 MG tablet TAKE 3 TABLETS BY MOUTH TWICE DAILY. 540 tablet 1   Mesalamine (ASACOL) 400 MG CPDR DR capsule TAKE ONE CAPSULE BY MOUTH FOUR TIMES DAILY 360 capsule 1  morphine (MS CONTIN) 30 MG 12 hr tablet Take 1 tablet (30 mg total) by mouth 2 (two) times daily. May fill 60 days after 09/30/2017 60 tablet 0   Multiple Vitamins-Minerals (CENTRUM PO) Take 1 tablet by mouth daily.     ondansetron (ZOFRAN) 4 MG tablet Take 1 tablet (4 mg total) by mouth every 8 (eight) hours as needed for nausea or vomiting. 20 tablet 0   Oxycodone HCl 10 MG TABS Take 1 tablet by mouth 4 times a day as needed for pain. Do not drive or operate machinery while on this medicine. (Patient taking differently: Take 10 mg by mouth 4 (four) times daily as needed (pain). Do not  drive or operate machinery while on this medicine.) 120 tablet 0   OZEMPIC, 2 MG/DOSE, 8 MG/3ML SOPN inject 2mg  SUBCUTANEOUSLY WEEKLY (Patient taking differently: Inject 2 mg into the skin every Wednesday.) 3 mL 3   Potassium Citrate 15 MEQ (1620 MG) TBCR Take 1 tablet by mouth 2 (two) times daily. 180 tablet 3   promethazine (PHENERGAN) 25 MG tablet Take 0.5 tablets (12.5 mg total) by mouth every 6 (six) hours as needed for nausea or vomiting. (Patient taking differently: Take 25 mg by mouth every 6 (six) hours as needed for nausea or vomiting.) 30 tablet 1   QUEtiapine (SEROQUEL) 100 MG tablet Take 100 mg by mouth at bedtime.     sodium bicarbonate 650 MG tablet Take 1 tablet (650 mg total) by mouth 2 (two) times daily. 60 tablet 11   tamsulosin (FLOMAX) 0.4 MG CAPS capsule Take 1 capsule (0.4 mg total) by mouth daily after supper. 90 capsule 3   TRUE METRIX BLOOD GLUCOSE TEST test strip CHECK BLOOD SUGAR 4 TIMES DAILY. 100 each 0   TRUEPLUS INSULIN SYRINGE 31G X 5/16" 1 ML MISC AS DIRECTED 100 each 0   venlafaxine XR (EFFEXOR-XR) 37.5 MG 24 hr capsule Take 1 capsule (37.5 mg total) by mouth daily. 90 capsule 3   vitamin C (ASCORBIC ACID) 500 MG tablet Take 500 mg by mouth daily.     No current facility-administered medications for this visit.    Allergies   Allergies as of 10/07/2022 - Review Complete 10/07/2022  Allergen Reaction Noted   Gabapentin Other (See Comments) 03/21/2016     Past Medical History   Past Medical History:  Diagnosis Date   Anxiety    Arthritis    Cerebral vasculitis    Chronic headaches    constant since possible stroke in 1997,"Vasculitis of brain with increased pressure of brain ".   COPD (chronic obstructive pulmonary disease) (HCC)    COPD (chronic obstructive pulmonary disease) (HCC)    Diabetes mellitus    Hyperlipidemia    Hypertension    Hypertriglyceridemia    Sleep apnea    has but doesnt use, cannot tolerate; PCP aware   Stroke [facility])    pt  states,"they arent sure if i had a stroke in 1997, there is still a question about that with my doctors". No deficits.   Tobacco abuse    Ulcerative colitis ([place])    Ulcerative colitis ([place])    Venous stasis     Past Surgical History   Past Surgical History:  Procedure Laterality Date   BIOPSY  10/25/2016   Procedure: BIOPSY;  Surgeon: Rogene Houston, MD;  Location: [facility];  Service: Endoscopy;;  colon   CERVICAL SPINE SURGERY  2008   x2-last surgery 04/24/2016.   COLONOSCOPY WITH PROPOFOL N/A  10/25/2016   Procedure: COLONOSCOPY WITH PROPOFOL;  Surgeon: Rogene Houston, MD;  Location: [facility];  Service: Endoscopy;  Laterality: N/A;  7:30   EXTRACORPOREAL SHOCK WAVE LITHOTRIPSY Left 08/08/2020   Procedure: EXTRACORPOREAL SHOCK WAVE LITHOTRIPSY (ESWL);  Surgeon: Cleon Gustin, MD;  Location: [facility];  Service: Urology;  Laterality: Left;   KNEE ARTHROSCOPY  02/25/2012   Procedure: ARTHROSCOPY KNEE;  Surgeon: Sanjuana Kava, MD;  Location: [facility];  Service: Orthopedics;  Laterality: Right;   KNEE ARTHROSCOPY WITH MEDIAL MENISECTOMY Left 12/23/2014   Procedure: LEFT KNEE ARTHROSCOPY WITH PARTIAL MENISECTOMY;  Surgeon: Sanjuana Kava, MD;  Location: [facility];  Service: Orthopedics;  Laterality: Left;   POLYPECTOMY  10/25/2016   Procedure: POLYPECTOMY;  Surgeon: Rogene Houston, MD;  Location: [facility];  Service: Endoscopy;;  colon   SHOULDER ARTHROSCOPY  2004   left shoulder   SHOULDER ARTHROSCOPY  2008   right shoulder    Review of Systems   General: Negative for anorexia, weight loss, fever, chills, fatigue, weakness. ENT: Negative for hoarseness, difficulty swallowing , nasal congestion. CV: Negative for chest pain, angina, palpitations, dyspnea on exertion, peripheral edema.  Respiratory: Negative for dyspnea at rest, dyspnea on exertion, cough, sputum, wheezing.  GI: See history of present illness. GU:  Negative for dysuria, hematuria, urinary incontinence, urinary  frequency, nocturnal urination.  Endo: Negative for unusual weight change.     Physical Exam   BP 113/79 (BP Location: Right Arm, Patient Position: Sitting, Cuff Size: Large)   Pulse 96   Temp 98 F (36.7 C) (Oral)   Ht 5\' 7"  (1.702 m)   Wt 223 lb 3.2 oz (101.2 kg)   SpO2 98%   BMI 34.96 kg/m    General: Well-nourished, well-developed in no acute distress.  Eyes: No icterus. Mouth: Oropharyngeal mucosa moist and pink  Abdomen: Bowel sounds are normal, nontender, nondistended  Rectal: thrombosed hemorrhoid, very tender, could not complete internal rectal exam due to pain.  Extremities: No lower extremity edema. No clubbing or deformities. Neuro: Alert and oriented x 4   Skin: Warm and dry, no jaundice.   Psych: Alert and cooperative, normal mood and affect.  Labs   Lab Results  Component Value Date   CREATININE 1.61 (H) 10/04/2022   BUN 34 (H) 10/04/2022   NA 140 10/04/2022   K 5.5 (H) 10/04/2022   CL 104 10/04/2022   CO2 21 10/04/2022   Lab Results  Component Value Date   HGBA1C 9.0 (H) 10/02/2022   Lab Results  Component Value Date   WBC 7.6 10/02/2022   HGB 12.2 (L) 10/02/2022   HCT 36.8 (L) 10/02/2022   MCV 90.9 10/02/2022   PLT 289 10/02/2022   Lab Results  Component Value Date   ALT 17 10/01/2021   AST 14 10/01/2021   ALKPHOS 120 10/01/2021   BILITOT <0.2 10/01/2021    Imaging Studies   Abdomen 1 view (KUB)  Result Date: 09/22/2022 CLINICAL DATA:  Nephrolithiasis EXAM: ABDOMEN - 1 VIEW COMPARISON:  12/26/2021 FINDINGS: The bowel gas pattern is normal. 1.5 cm calcification overlies the right kidney. Punctate calcification overlies the left kidney. These findings can be followed with CT. IMPRESSION: Bilateral nephrolithiasis. Electronically Signed   By: Sammie Bench M.D.   On: 09/22/2022 13:24   CT RENAL STONE STUDY  Result Date: 09/11/2022 CLINICAL DATA:  Bilateral flank pain, history of calculi EXAM: CT ABDOMEN AND PELVIS WITHOUT CONTRAST  TECHNIQUE: Multidetector CT imaging of the abdomen  and pelvis was performed following the standard protocol without IV contrast. RADIATION DOSE REDUCTION: This exam was performed according to the departmental dose-optimization program which includes automated exposure control, adjustment of the mA and/or kV according to patient size and/or use of iterative reconstruction technique. COMPARISON:  09/14/2020 and radiographs from 12/26/2021 FINDINGS: Lower chest: Unremarkable Hepatobiliary: Unremarkable Pancreas: Unremarkable Spleen: Unremarkable Adrenals/Urinary Tract: Stable 3.7 by 4.1 cm right adrenal myelolipoma, image 19 series 2. No signs of interval hematoma. This lesion is benign and does not require further imaging workup. Cluster calculi in the right kidney lower pole measuring up to 1.1 cm in long axis. There is also a 2 mm right kidney upper pole nonobstructive renal calculus. Two 2 mm left renal calculi are present. No hydronephrosis or hydroureter.  No ureteral or bladder calculus. Stomach/Bowel: Unremarkable.  Normal appendix. Vascular/Lymphatic: Atherosclerosis is present, including aortoiliac atherosclerotic disease. Reproductive: Borderline prostatomegaly. Other: No supplemental non-categorized findings. Musculoskeletal: Mild lumbar spondylosis and degenerative disc disease. IMPRESSION: 1. Bilateral nonobstructive nephrolithiasis. 2. Stable 4.1 cm right adrenal myelolipoma. This lesion is benign and does not require further imaging workup. 3. Borderline prostatomegaly. 4. Mild lumbar spondylosis and degenerative disc disease. 5. Aortic atherosclerosis. Aortic Atherosclerosis (ICD10-I70.0). Electronically Signed   By: Van Clines M.D.   On: 09/11/2022 15:04    Assessment   Thrombosed hemorrhoid: rectal pain started last night. Complains of significant rectal pain, unable to complete rectal exam. Would be very difficult for patient to complete bowel prep given current situation. Will see if  general surgery can lance. Discussed sitz baths. Avoid straining.   UC: on mesalamine. Plans for colonoscopy Wednesday, will postpone for now. He also has CKD stage 3A followed by Dr. Theador Hawthorne.  Labs 5 days ago with bump in Creatinine to 1.93 down to 1.61 Friday. His potassium has also been elevated. On 10/02/22 potassium was 5.3 (normal 3.5-5.2), 10/03/22 potassium was 5.3 (normal 3.4-5.3), 10/04/22 potassium 5.5 (normal 3.5-5.2). Will have him hold potassium citrate for 3 days. Consume low potassium foods.    PLAN   General surgery for thrombosed hemorrhoid.  Cancel colonoscopy for now. Will reschedule at later date. Will ask Dr. Jenetta Downer for input for timing of colonoscopy in setting of thrombosed hemorrhoid and hyperkalemia/CKD.    Laureen Ochs. Bobby Rumpf, [place], PA-C [facility]  I have reviewed the note and agree with the APP's assessment as described in this progress note  Given presence of thrombosed hemorrhoids and persistent hyperkalemia, will postpone colonoscopy. Patient to be evaluated by general surgery for hemorrhoids. Will need also evaluation by nephrology and PCP for hyperkalemia. Colonoscopy may be rescheduled in 3 months. Maylon Peppers, MD Gastroenterology and Hepatology [facility]

## 2022-10-07 NOTE — Telephone Encounter (Signed)
Pt was informed of referral to [facility] in [place] on 10/08/22, arrive at 2pm to fill out paper work. Gave address and phone number to facility. Pt verbalized understanding.

## 2022-10-07 NOTE — Telephone Encounter (Signed)
Tammy, please let pt know that Dr. Jenetta Downer wants him to recheck that BMET again tomorrow to see where his potassium level is.   Ask him when he sees nephrology again.

## 2022-10-08 ENCOUNTER — Other Ambulatory Visit: Payer: Self-pay

## 2022-10-08 DIAGNOSIS — K5190 Ulcerative colitis, unspecified, without complications: Secondary | ICD-10-CM

## 2022-10-08 DIAGNOSIS — E875 Hyperkalemia: Secondary | ICD-10-CM

## 2022-10-08 NOTE — Telephone Encounter (Signed)
Called pt. He is aware. Labs sent to PCP

## 2022-10-08 NOTE — Telephone Encounter (Signed)
Pt was made aware and verbalized understanding. Lab was ordered, pt will go today before 2 pm. Pt has an appt with nephrology on 10/15/2022.

## 2022-10-08 NOTE — Telephone Encounter (Signed)
noted 

## 2022-10-08 NOTE — Telephone Encounter (Signed)
Thanks for the update. He will need to follow up with his PCP/nephrologist regarding his high potassium. Please ask him to do so.  Mitzie, can you please schedule a follow up appointment for this patient in 3 months with me or any of the APPs? We will discuss rescheduling him at that time.  Thanks,  Maylon Peppers, MD Gastroenterology and Hepatology [facility]

## 2022-10-09 ENCOUNTER — Encounter (HOSPITAL_COMMUNITY): Admission: RE | Payer: Self-pay | Source: Home / Self Care

## 2022-10-09 ENCOUNTER — Ambulatory Visit (HOSPITAL_COMMUNITY): Admission: RE | Admit: 2022-10-09 | Payer: Medicare Other | Source: Home / Self Care | Admitting: Gastroenterology

## 2022-10-09 ENCOUNTER — Other Ambulatory Visit: Payer: Self-pay | Admitting: Family Medicine

## 2022-10-09 DIAGNOSIS — J449 Chronic obstructive pulmonary disease, unspecified: Secondary | ICD-10-CM

## 2022-10-09 LAB — BASIC METABOLIC PANEL
BUN/Creatinine Ratio: 22 — ABNORMAL HIGH (ref 9–20)
BUN: 29 mg/dL — ABNORMAL HIGH (ref 6–24)
CO2: 18 mmol/L — ABNORMAL LOW (ref 20–29)
Calcium: 9.5 mg/dL (ref 8.7–10.2)
Chloride: 104 mmol/L (ref 96–106)
Creatinine, Ser: 1.3 mg/dL — ABNORMAL HIGH (ref 0.76–1.27)
Glucose: 167 mg/dL — ABNORMAL HIGH (ref 70–99)
Potassium: 5.3 mmol/L — ABNORMAL HIGH (ref 3.5–5.2)
Sodium: 140 mmol/L (ref 134–144)
eGFR: 64 mL/min/{1.73_m2} (ref >59)

## 2022-10-09 SURGERY — COLONOSCOPY WITH PROPOFOL
Anesthesia: Monitor Anesthesia Care

## 2022-10-10 ENCOUNTER — Other Ambulatory Visit: Payer: Self-pay | Admitting: Family Medicine

## 2022-10-10 ENCOUNTER — Ambulatory Visit: Payer: Medicare Other | Admitting: Nurse Practitioner

## 2022-10-10 NOTE — Addendum Note (Signed)
Addended by: Harvel Quale on: 10/10/2022 11:11 PM   Modules accepted: Level of Service

## 2022-10-17 ENCOUNTER — Other Ambulatory Visit: Payer: Self-pay | Admitting: Urology

## 2022-10-30 ENCOUNTER — Encounter: Payer: Self-pay | Admitting: Nurse Practitioner

## 2022-10-30 ENCOUNTER — Ambulatory Visit (INDEPENDENT_AMBULATORY_CARE_PROVIDER_SITE_OTHER): Payer: Medicare Other | Admitting: Nurse Practitioner

## 2022-10-30 VITALS — BP 100/62 | HR 94 | Ht 67.0 in | Wt 223.4 lb

## 2022-10-30 DIAGNOSIS — Z794 Long term (current) use of insulin: Secondary | ICD-10-CM | POA: Diagnosis not present

## 2022-10-30 DIAGNOSIS — N183 Chronic kidney disease, stage 3 unspecified: Secondary | ICD-10-CM

## 2022-10-30 DIAGNOSIS — E1165 Type 2 diabetes mellitus with hyperglycemia: Secondary | ICD-10-CM

## 2022-10-30 DIAGNOSIS — E1122 Type 2 diabetes mellitus with diabetic chronic kidney disease: Secondary | ICD-10-CM | POA: Diagnosis not present

## 2022-10-30 MED ORDER — INSULIN ASPART 100 UNIT/ML IJ SOLN: 14.0000 [IU] | Freq: Three times a day (TID) | INTRAMUSCULAR | 3 refills | Status: DC

## 2022-10-30 MED ORDER — TRESIBA FLEXTOUCH 200 UNIT/ML ~~LOC~~ SOPN: 80.0000 [IU] | PEN_INJECTOR | Freq: Every day | SUBCUTANEOUS | 11 refills | Status: DC

## 2022-10-30 NOTE — Progress Notes (Signed)
Endocrinology Consult Note       10/30/2022, 9:55 AM   Subjective:    Patient ID: Shaun Harris, male    DOB: Jun 14, 1966.  Shaun Harris is being seen in consultation for management of currently uncontrolled symptomatic diabetes requested by  Tommie Sams, DO.   Past Medical History:  Diagnosis Date   Anxiety    Arthritis    Cerebral vasculitis    Chronic headaches    constant since possible stroke in 1997,"Vasculitis of brain with increased pressure of brain ".   COPD (chronic obstructive pulmonary disease)    COPD (chronic obstructive pulmonary disease)    Diabetes mellitus    Hyperlipidemia    Hypertension    Hypertriglyceridemia    Sleep apnea    has but doesnt use, cannot tolerate; PCP aware   Stroke    pt states,"they arent sure if i had a stroke in 1997, there is still a question about that with my doctors". No deficits.   Tobacco abuse    Ulcerative colitis    Ulcerative colitis    Venous stasis     Past Surgical History:  Procedure Laterality Date   BIOPSY  10/25/2016   Procedure: BIOPSY;  Surgeon: Malissa Hippo, MD;  Location: [facility];  Service: Endoscopy;;  colon   CERVICAL SPINE SURGERY  2008   x2-last surgery 04/24/2016.   COLONOSCOPY WITH PROPOFOL N/A 10/25/2016   Procedure: COLONOSCOPY WITH PROPOFOL;  Surgeon: Malissa Hippo, MD;  Location: [facility];  Service: Endoscopy;  Laterality: N/A;  7:30   EXTRACORPOREAL SHOCK WAVE LITHOTRIPSY Left 08/08/2020   Procedure: EXTRACORPOREAL SHOCK WAVE LITHOTRIPSY (ESWL);  Surgeon: Malen Gauze, MD;  Location: [facility];  Service: Urology;  Laterality: Left;   KNEE ARTHROSCOPY  02/25/2012   Procedure: ARTHROSCOPY KNEE;  Surgeon: Darreld Mclean, MD;  Location: [facility];  Service: Orthopedics;  Laterality: Right;   KNEE ARTHROSCOPY WITH MEDIAL MENISECTOMY Left 12/23/2014   Procedure: LEFT KNEE ARTHROSCOPY WITH PARTIAL MENISECTOMY;  Surgeon:  Darreld Mclean, MD;  Location: [facility];  Service: Orthopedics;  Laterality: Left;   POLYPECTOMY  10/25/2016   Procedure: POLYPECTOMY;  Surgeon: Malissa Hippo, MD;  Location: [facility];  Service: Endoscopy;;  colon   SHOULDER ARTHROSCOPY  2004   left shoulder   SHOULDER ARTHROSCOPY  2008   right shoulder    Social History   Socioeconomic History   Marital status: Widowed    Spouse name: Not on file   Number of children: Not on file   Years of education: Not on file   Highest education level: Not on file  Occupational History   Not on file  Tobacco Use   Smoking status: Every Day    Packs/day: 1.00    Years: 35.00    Additional pack years: 0.00    Total pack years: 35.00    Types: Cigarettes   Smokeless tobacco: Never  Vaping Use   Vaping Use: Never used  Substance and Sexual Activity   Alcohol use: No   Drug use: No   Sexual activity: Yes    Birth control/protection: None  Other Topics Concern   Not on  file  Social History Narrative   Not on file   Social Determinants of Health   Financial Resource Strain: Not on file  Food Insecurity: Not on file  Transportation Needs: Not on file  Physical Activity: Not on file  Stress: Not on file  Social Connections: Not on file    Family History  Problem Relation Age of Onset   Hypertension Father    Diabetes Father    Heart disease Father    Heart disease Maternal Grandfather    Heart attack Other    Heart disease Brother        both brothers have heart disease   Hypertension Sister     Outpatient Encounter Medications as of 10/30/2022  Medication Sig   acetaminophen (TYLENOL) 500 MG tablet Take 1,000 mg by mouth every 6 (six) hours as needed for moderate pain.   AIMOVIG 70 MG/ML SOAJ Inject 70 mg into the skin every 30 (thirty) days.   albuterol (VENTOLIN HFA) 108 (90 Base) MCG/ACT inhaler inhale TWO puffs by MOUTH every SIX hours IN THE LEFT EAR needed FOR shortness of breath   ALPRAZolam (XANAX) 0.25 MG  tablet Take 0.25 mg by mouth 3 (three) times daily.   aspirin EC 325 MG tablet Take 325 mg by mouth at bedtime.   atorvastatin (LIPITOR) 20 MG tablet TAKE ONE TABLET BY MOUTH EVERY DAY   Blood Glucose Monitoring Suppl w/Device KIT Test glucose 4 times daily. E11.9   buPROPion (WELLBUTRIN SR) 150 MG 12 hr tablet TAKE ONE TABLET BY MOUTH TWICE DAILY   cyclobenzaprine (FLEXERIL) 5 MG tablet TAKE ONE TABLET BY MOUTH THREE TIMES DAILY AS NEEDED FOR MUSCLE SPASMS   enalapril (VASOTEC) 20 MG tablet TAKE ONE TABLET BY MOUTH TWICE DAILY   fluticasone-salmeterol (ADVAIR DISKUS) 250-50 MCG/ACT AEPB USE 1 INHALATION BY MOUTH EVERY 12 HOURS. <<RINSE MOUTH AFTER USE>>.   furosemide (LASIX) 20 MG tablet TAKE ONE TABLET BY MOUTH EVERY DAY   hydrOXYzine (ATARAX/VISTARIL) 50 MG tablet Take 50 mg by mouth daily as needed for anxiety.   levETIRAcetam (KEPPRA) 500 MG tablet TAKE 3 TABLETS BY MOUTH TWICE DAILY.   Mesalamine (ASACOL) 400 MG CPDR DR capsule TAKE ONE CAPSULE BY MOUTH FOUR TIMES DAILY   morphine (MS CONTIN) 30 MG 12 hr tablet Take 1 tablet (30 mg total) by mouth 2 (two) times daily. May fill 60 days after 09/30/2017   Multiple Vitamins-Minerals (CENTRUM PO) Take 1 tablet by mouth daily.   ondansetron (ZOFRAN) 4 MG tablet Take 1 tablet (4 mg total) by mouth every 8 (eight) hours as needed for nausea or vomiting.   Oxycodone HCl 10 MG TABS Take 1 tablet by mouth 4 times a day as needed for pain. Do not drive or operate machinery while on this medicine. (Patient taking differently: Take 10 mg by mouth 4 (four) times daily as needed (pain). Do not drive or operate machinery while on this medicine.)   OZEMPIC, 2 MG/DOSE, 8 MG/3ML SOPN inject  SUBCUTANEOUSLY WEEKLY (Patient taking differently: Inject 2 mg into the skin every Wednesday.)   Potassium Citrate 15 MEQ (1620 MG) TBCR Take 1 tablet by mouth 2 (two) times daily.   promethazine (PHENERGAN) 25 MG tablet Take 0.5 tablets (12.5 mg total) by mouth every 6  (six) hours as needed for nausea or vomiting. (Patient taking differently: Take 25 mg by mouth every 6 (six) hours as needed for nausea or vomiting.)   QUEtiapine (SEROQUEL) 100 MG tablet Take 100 mg by mouth  at bedtime.   sodium bicarbonate 650 MG tablet Take 1 tablet (650 mg total) by mouth 2 (two) times daily.   tamsulosin (FLOMAX) 0.4 MG CAPS capsule Take 1 capsule (0.4 mg total) by mouth daily after supper.   TRUE METRIX BLOOD GLUCOSE TEST test strip CHECK BLOOD SUGAR 4 TIMES DAILY.   TRUEPLUS INSULIN SYRINGE 31G X 5/16" 1 ML MISC AS DIRECTED   venlafaxine XR (EFFEXOR-XR) 37.5 MG 24 hr capsule Take 1 capsule (37.5 mg total) by mouth daily.   vitamin C (ASCORBIC ACID) 500 MG tablet Take 500 mg by mouth daily.   [DISCONTINUED] insulin aspart (NOVOLOG) 100 UNIT/ML injection Inject 15 Units into the skin 3 (three) times daily with meals.   [DISCONTINUED] insulin degludec (TRESIBA FLEXTOUCH) 200 UNIT/ML FlexTouch Pen Inject 120 Units into the skin daily. (Patient taking differently: Inject 60 Units into the skin in the morning and at bedtime.)   insulin aspart (NOVOLOG) 100 UNIT/ML injection Inject 14-20 Units into the skin 3 (three) times daily with meals.   insulin degludec (TRESIBA FLEXTOUCH) 200 UNIT/ML FlexTouch Pen Inject 80 Units into the skin at bedtime.   No facility-administered encounter medications on file as of 10/30/2022.    ALLERGIES: Allergies  Allergen Reactions   Gabapentin Other (See Comments)    Increased blood sugar    VACCINATION STATUS: Immunization History  Administered Date(s) Administered   Influenza Split 04/01/2013   Influenza,inj,Quad PF,6+ Mos 03/30/2014, 03/22/2015, 03/21/2016, 04/07/2017, 03/30/2018, 07/02/2021, 07/03/2022   Pneumococcal Polysaccharide-23 07/02/2018   Pneumococcal-Unspecified 06/26/2004    Diabetes He presents for his initial diabetic visit. He has type 2 diabetes mellitus. Onset time: diagnosed at approx age of 22. His disease course  has been fluctuating. Hypoglycemia symptoms include nervousness/anxiousness, sweats and tremors. Associated symptoms include blurred vision and fatigue. There are no hypoglycemic complications. Symptoms are stable. Diabetic complications include a CVA, heart disease, nephropathy and peripheral neuropathy. Risk factors for coronary artery disease include tobacco exposure, sedentary lifestyle, male sex, obesity, hypertension, diabetes mellitus, dyslipidemia and family history. Current diabetic treatment includes intensive insulin program (and Ozempic). He is compliant with treatment most of the time. His weight is stable. He is following a generally unhealthy diet. When asked about meal planning, he reported none. He has not had a previous visit with a dietitian. He rarely participates in exercise. (He presents today for his consultation with no meter or logs to review.  His most recent A1c on 3/20 was 9%.  He checks glucose with standard meter 4-5 times per day.  He drinks mostly caffeine free diet soda, little water, some tea when out to eat.  He typically only eats 1 meal per day and snacks the rest of the time.  He does not engage in routine physical activity, is limited by pain and COPD.  He is due for eye exam, saw podiatry a long time ago.) An ACE inhibitor/angiotensin II receptor blocker is being taken. He does not see a podiatrist (saw in distant past).Eye exam is not current.     Review of systems  Constitutional: + Minimally fluctuating body weight, current Body mass index is 34.99 kg/m., no fatigue, no subjective hyperthermia, no subjective hypothermia Eyes: no blurry vision, no xerophthalmia ENT: no sore throat, no nodules palpated in throat, no dysphagia/odynophagia, no hoarseness Cardiovascular: no chest pain, no shortness of breath, no palpitations, no leg swelling Respiratory: no cough, no shortness of breath Gastrointestinal: no nausea/vomiting/diarrhea Musculoskeletal: no muscle/joint  aches Skin: no rashes, no hyperemia Neurological: no tremors,  no numbness, no tingling, no dizziness Psychiatric: no depression, no anxiety  Objective:     BP 100/62 (BP Location: Left Arm, Patient Position: Sitting, Cuff Size: Large)   Pulse 94   Ht 5\' 7"  (1.702 m)   Wt 223 lb 6.4 oz (101.3 kg)   BMI 34.99 kg/m   Wt Readings from Last 3 Encounters:  10/30/22 223 lb 6.4 oz (101.3 kg)  10/07/22 223 lb 3.2 oz (101.2 kg)  10/02/22 220 lb (99.8 kg)     BP Readings from Last 3 Encounters:  10/30/22 100/62  10/07/22 113/79  10/02/22 93/61     Physical Exam- Limited  Constitutional:  Body mass index is 34.99 kg/m. , not in acute distress, normal state of mind Eyes:  EOMI, no exophthalmos Neck: Supple Cardiovascular: RRR, no murmurs, rubs, or gallops, no edema Respiratory: Adequate breathing efforts, no crackles, rales, rhonchi, or wheezing Musculoskeletal: no gross deformities, strength intact in all four extremities, no gross restriction of joint movements Skin:  no rashes, + hyperemia to BLE, nicotinic discoloration to bilateral fingertips Neurological: no tremor with outstretched hands    CMP ( most recent) CMP     Component Value Date/Time   NA 140 10/08/2022 1322   K 5.3 (H) 10/08/2022 1322   CL 104 10/08/2022 1322   CO2 18 (L) 10/08/2022 1322   GLUCOSE 167 (H) 10/08/2022 1322   GLUCOSE 161 (H) 10/02/2022 1137   BUN 29 (H) 10/08/2022 1322   CREATININE 1.30 (H) 10/08/2022 1322   CREATININE 0.78 04/01/2013 1046   CALCIUM 9.5 10/08/2022 1322   PROT 6.4 10/01/2021 1004   ALBUMIN 4.6 10/01/2021 1004   AST 14 10/01/2021 1004   ALT 17 10/01/2021 1004   ALKPHOS 120 10/01/2021 1004   BILITOT <0.2 10/01/2021 1004   GFRNONAA 40 (L) 10/02/2022 1137   GFRAA 98 03/28/2020 0939     Diabetic Labs (most recent): Lab Results  Component Value Date   HGBA1C 9.0 (H) 10/02/2022   HGBA1C 7.7 (H) 07/03/2022   HGBA1C 8.9 (H) 01/01/2022     Lipid Panel ( most  recent) Lipid Panel     Component Value Date/Time   CHOL 155 10/02/2022 1139   CHOL 142 01/01/2022 1157   TRIG 340 (H) 10/02/2022 1139   HDL 35 (L) 10/02/2022 1139   HDL 45 01/01/2022 1157   CHOLHDL 4.4 10/02/2022 1139   VLDL 68 (H) 10/02/2022 1139   LDLCALC 52 10/02/2022 1139   LDLCALC 75 01/01/2022 1157   LABVLDL 22 01/01/2022 1157      No results found for: "TSH", "FREET4"         Assessment & Plan:   1) Type 2 diabetes mellitus with hyperglycemia, with long-term current use of insulin  He presents today for his consultation with no meter or logs to review.  His most recent A1c on 3/20 was 9%.  He checks glucose with standard meter 4-5 times per day.  He drinks mostly caffeine free diet soda, little water, some tea when out to eat.  He typically only eats 1 meal per day and snacks the rest of the time.  He does not engage in routine physical activity, is limited by pain and COPD.  He is due for eye exam, saw podiatry a long time ago.  - Shaun Harris has currently uncontrolled symptomatic type 2 DM since 57 years of age, with most recent A1c of 9 %.   -Recent labs reviewed.  - I had a long discussion  with him about the progressive nature of diabetes and the pathology behind its complications. -his diabetes is complicated by CKD stage 3a, ?CVA, tobacco abuse and he remains at a high risk for more acute and chronic complications which include CAD, CVA, CKD, retinopathy, and neuropathy. These are all discussed in detail with him.  The following Lifestyle Medicine recommendations according to American College of Lifestyle Medicine [facility]) were discussed and offered to patient and he agrees to start the journey:  A. Whole Foods, Plant-based plate comprising of fruits and vegetables, plant-based proteins, whole-grain carbohydrates was discussed in detail with the patient.   A list for source of those nutrients were also provided to the patient.  Patient will use only water or  unsweetened tea for hydration. B.  The need to stay away from risky substances including alcohol, smoking; obtaining 7 to 9 hours of restorative sleep, at least 150 minutes of moderate intensity exercise weekly, the importance of healthy social connections,  and stress reduction techniques were discussed. C.  A full color page of  Calorie density of various food groups per pound showing examples of each food groups was provided to the patient.  - I have counseled him on diet and weight management by adopting a carbohydrate restricted/protein rich diet. Patient is encouraged to switch to unprocessed or minimally processed complex starch and increased protein intake (animal or plant source), fruits, and vegetables. -  he is advised to stick to a routine mealtimes to eat 3 meals a day and avoid unnecessary snacks (to snack only to correct hypoglycemia).   - he acknowledges that there is a room for improvement in his food and drink choices. - Suggestion is made for him to avoid simple carbohydrates from his diet including Cakes, Sweet Desserts, Ice Cream, Soda (diet and regular), Sweet Tea, Candies, Chips, Cookies, Store Bought Juices, Alcohol in Excess of 1-2 drinks a day, Artificial Sweeteners, Coffee Creamer, and "Sugar-free" Products. This will help patient to have more stable blood glucose profile and potentially avoid unintended weight gain.  - I have approached him with the following individualized plan to manage his diabetes and patient agrees:   -He is advised to change his Tresiba to 80 units SQ nightly (instead of 60 BID) and adjust his Novolog to 14-20 units TID with meals if glucose is above 90 and he is eating (Specific instructions on how to titrate insulin dosage based on glucose readings given to patient in writing).  He demonstrated his ability to use the SSI chart provided today to dose insulin correctly.  -he is encouraged to continue monitoring glucose 4 times daily, before meals and  before bed, to log their readings on the clinic sheets provided, and bring them to review at follow up appointment in 4 weeks.  We did talk about possibility of adding CGM in future.  - he is warned not to take insulin without proper monitoring per orders. - Adjustment parameters are given to him for hypo and hyperglycemia in writing. - he is encouraged to call clinic for blood glucose levels less than 70 or above 300 mg /dl.  -He is not an ideal candidate for incretin therapy given heavy smoking history as it increases his risk of pancreatitis, however, he has been on Ozempic for a while without evidence of such.  He can stay on Ozempic 2 mg SQ weekly for now.  We discussed s/s of pancreatitis to watch out for.  - Specific targets for  A1c; LDL, HDL, and Triglycerides were  discussed with the patient.  2) Blood Pressure /Hypertension:  his blood pressure is controlled to target.   he is advised to continue his current medications including Lasix 20 mg p.o. daily with breakfast, Enalapril 20 mg po daily.  3) Lipids/Hyperlipidemia:    Review of his recent lipid panel from 10/03/22 showed controlled LDL at 52 and significantly elevated triglycerides of 340 .  he is advised to continue Lipitor 20 mg daily at bedtime.  Side effects and precautions discussed with him.  We also discussed importance of low fat diet today.  4)  Weight/Diet:  his Body mass index is 34.99 kg/m.  -  clearly complicating his diabetes care.   he is a candidate for weight loss. I discussed with him the fact that loss of 5 - 10% of his  current body weight will have the most impact on his diabetes management.  Exercise, and detailed carbohydrates information provided  -  detailed on discharge instructions.  5) Chronic Care/Health Maintenance: -he is on ACEI/ARB and Statin medications and is encouraged to initiate and continue to follow up with Ophthalmology, Dentist, Podiatrist at least yearly or according to recommendations,  and advised to QUIT SMOKING. I have recommended yearly flu vaccine and pneumonia vaccine at least every 5 years; moderate intensity exercise for up to 150 minutes weekly; and sleep for at least 7 hours a day.  - he is advised to maintain close follow up with Tommie Sams, DO for primary care needs, as well as his other providers for optimal and coordinated care.   - Time spent in this patient care: 60 min, of which > 50% was spent in counseling him about his diabetes and the rest reviewing his blood glucose logs, discussing his hypoglycemia and hyperglycemia episodes, reviewing his current and previous labs/studies (including abstraction from other facilities) and medications doses and developing a long term treatment plan based on the latest standards of care/guidelines; and documenting his care.    Please refer to Patient Instructions for Blood Glucose Monitoring and Insulin/Medications Dosing Guide" in media tab for additional information. Please also refer to "Patient Self Inventory" in the Media tab for reviewed elements of pertinent patient history.  Shaun Harris participated in the discussions, expressed understanding, and voiced agreement with the above plans.  All questions were answered to his satisfaction. he is encouraged to contact clinic should he have any questions or concerns prior to his return visit.     Follow up plan: - Return in about 1 month (around 11/29/2022) for Diabetes F/U, Bring meter and logs.    Ronny Bacon, [facility] [address] [place], [place] [address] Phone: [phone] Fax: [phone]  10/30/2022, 9:55 AM

## 2022-10-30 NOTE — Patient Instructions (Signed)

## 2022-11-06 ENCOUNTER — Ambulatory Visit (INDEPENDENT_AMBULATORY_CARE_PROVIDER_SITE_OTHER): Payer: Medicare Other | Admitting: Urology

## 2022-11-06 VITALS — BP 101/70 | HR 112

## 2022-11-06 DIAGNOSIS — M5442 Lumbago with sciatica, left side: Secondary | ICD-10-CM

## 2022-11-06 DIAGNOSIS — R3912 Poor urinary stream: Secondary | ICD-10-CM | POA: Diagnosis not present

## 2022-11-06 DIAGNOSIS — N200 Calculus of kidney: Secondary | ICD-10-CM | POA: Diagnosis not present

## 2022-11-06 LAB — URINALYSIS, ROUTINE W REFLEX MICROSCOPIC
Bilirubin, UA: NEGATIVE
Glucose, UA: NEGATIVE
Ketones, UA: NEGATIVE
Leukocytes,UA: NEGATIVE
Nitrite, UA: NEGATIVE
Protein,UA: NEGATIVE
RBC, UA: NEGATIVE
Specific Gravity, UA: 1.02 (ref 1.005–1.030)
Urobilinogen, Ur: 0.2 mg/dL (ref 0.2–1.0)
pH, UA: 7 (ref 5.0–7.5)

## 2022-11-06 MED ORDER — OXYCODONE-ACETAMINOPHEN 5-325 MG PO TABS: 1.0000 | ORAL_TABLET | ORAL | 0 refills | Status: DC | PRN

## 2022-11-06 MED ORDER — ONDANSETRON HCL 4 MG PO TABS: 4.0000 mg | ORAL_TABLET | Freq: Three times a day (TID) | ORAL | 0 refills | Status: DC | PRN

## 2022-11-06 MED ORDER — CYCLOBENZAPRINE HCL 5 MG PO TABS: 5.0000 mg | ORAL_TABLET | Freq: Three times a day (TID) | ORAL | 1 refills | Status: DC | PRN

## 2022-11-06 MED ORDER — TAMSULOSIN HCL 0.4 MG PO CAPS: 0.4000 mg | ORAL_CAPSULE | Freq: Every day | ORAL | 3 refills | Status: DC

## 2022-11-06 NOTE — Patient Instructions (Signed)

## 2022-11-06 NOTE — Progress Notes (Signed)
11/06/2022 1:30 PM   JAMESYN LINDELL [birth year] [id number]  Referring provider: Tommie Sams, DO [address] [place],  [place] [address]  Followup back pain and nephrolithiasis   HPI: Mr Bruemmer is a 57yo here for followup for nephrolithiasis and back pain. No stone events since last visit. He has new left flank pain which is different then his chronic back pain. No worsening LUTS. He uses flexeril prn for his back pain which works well.    PMH: Past Medical History:  Diagnosis Date   Anxiety    Arthritis    Cerebral vasculitis    Chronic headaches    constant since possible stroke in 1997,"Vasculitis of brain with increased pressure of brain ".   COPD (chronic obstructive pulmonary disease)    COPD (chronic obstructive pulmonary disease)    Diabetes mellitus    Hyperlipidemia    Hypertension    Hypertriglyceridemia    Sleep apnea    has but doesnt use, cannot tolerate; PCP aware   Stroke    pt states,"they arent sure if i had a stroke in 1997, there is still a question about that with my doctors". No deficits.   Tobacco abuse    Ulcerative colitis    Ulcerative colitis    Venous stasis     Surgical History: Past Surgical History:  Procedure Laterality Date   BIOPSY  10/25/2016   Procedure: BIOPSY;  Surgeon: Malissa Hippo, MD;  [facility]: [facility];  Service: Endoscopy;;  colon   CERVICAL SPINE SURGERY  2008   x2-last surgery 04/24/2016.   COLONOSCOPY WITH PROPOFOL N/A 10/25/2016   Procedure: COLONOSCOPY WITH PROPOFOL;  Surgeon: Malissa Hippo, MD;  Location: [facility];  Service: Endoscopy;  Laterality: N/A;  7:30   EXTRACORPOREAL SHOCK WAVE LITHOTRIPSY Left 08/08/2020   Procedure: EXTRACORPOREAL SHOCK WAVE LITHOTRIPSY (ESWL);  Surgeon: Malen Gauze, MD;  Location: [facility];  Service: Urology;  Laterality: Left;   KNEE ARTHROSCOPY  02/25/2012   Procedure: ARTHROSCOPY KNEE;  Surgeon: Darreld Mclean, MD;  Location: [facility];  Service: Orthopedics;   Laterality: Right;   KNEE ARTHROSCOPY WITH MEDIAL MENISECTOMY Left 12/23/2014   Procedure: LEFT KNEE ARTHROSCOPY WITH PARTIAL MENISECTOMY;  Surgeon: Darreld Mclean, MD;  Location: [facility];  Service: Orthopedics;  Laterality: Left;   POLYPECTOMY  10/25/2016   Procedure: POLYPECTOMY;  Surgeon: Malissa Hippo, MD;  [facility]: [facility];  Service: Endoscopy;;  colon   SHOULDER ARTHROSCOPY  2004   left shoulder   SHOULDER ARTHROSCOPY  2008   right shoulder    Home Medications:  Allergies as of 11/06/2022       Reactions   Gabapentin Other (See Comments)   Increased blood sugar        Medication List        Accurate as of November 06, 2022  1:30 PM. If you have any questions, ask your nurse or doctor.          acetaminophen 500 MG tablet Commonly known as: TYLENOL Take 1,000 mg by mouth every 6 (six) hours as needed for moderate pain.   Aimovig 70 MG/ML Soaj Generic drug: Erenumab-aooe Inject 70 mg into the skin every 30 (thirty) days.   albuterol 108 (90 Base) MCG/ACT inhaler Commonly known as: Ventolin HFA inhale TWO puffs by MOUTH every SIX hours IN THE LEFT EAR needed FOR shortness of breath   ALPRAZolam 0.25 MG tablet Commonly known as: XANAX Take 0.25 mg by mouth  3 (three) times daily.   ascorbic acid 500 MG tablet Commonly known as: VITAMIN C Take 500 mg by mouth daily.   aspirin EC 325 MG tablet Take 325 mg by mouth at bedtime.   atorvastatin 20 MG tablet Commonly known as: LIPITOR TAKE ONE TABLET BY MOUTH EVERY DAY   Blood Glucose Monitoring Suppl w/Device Kit Test glucose 4 times daily. E11.9   buPROPion 150 MG 12 hr tablet Commonly known as: WELLBUTRIN SR TAKE ONE TABLET BY MOUTH TWICE DAILY   CENTRUM PO Take 1 tablet by mouth daily.   cyclobenzaprine 5 MG tablet Commonly known as: FLEXERIL TAKE ONE TABLET BY MOUTH THREE TIMES DAILY AS NEEDED FOR MUSCLE SPASMS   enalapril 20 MG tablet Commonly known as: VASOTEC TAKE ONE TABLET BY MOUTH TWICE  DAILY   fluticasone-salmeterol 250-50 MCG/ACT Aepb Commonly known as: Advair Diskus USE 1 INHALATION BY MOUTH EVERY 12 HOURS. <<RINSE MOUTH AFTER USE>>.   furosemide 20 MG tablet Commonly known as: LASIX TAKE ONE TABLET BY MOUTH EVERY DAY   hydrOXYzine 50 MG tablet Commonly known as: ATARAX Take 50 mg by mouth daily as needed for anxiety.   insulin aspart 100 UNIT/ML injection Commonly known as: NovoLOG Inject 14-20 Units into the skin 3 (three) times daily with meals.   levETIRAcetam 500 MG tablet Commonly known as: KEPPRA TAKE 3 TABLETS BY MOUTH TWICE DAILY.   Mesalamine 400 MG Cpdr DR capsule Commonly known as: ASACOL TAKE ONE CAPSULE BY MOUTH FOUR TIMES DAILY   morphine 30 MG 12 hr tablet Commonly known as: MS CONTIN Take 1 tablet (30 mg total) by mouth 2 (two) times daily. May fill 60 days after 09/30/2017   ondansetron 4 MG tablet Commonly known as: Zofran Take 1 tablet (4 mg total) by mouth every 8 (eight) hours as needed for nausea or vomiting.   Oxycodone HCl 10 MG Tabs Take 1 tablet by mouth 4 times a day as needed for pain. Do not drive or operate machinery while on this medicine. What changed:  how much to take how to take this when to take this reasons to take this additional instructions   Ozempic (2 MG/DOSE) 8 MG/3ML Sopn Generic drug: Semaglutide (2 MG/DOSE) inject 2mg  SUBCUTANEOUSLY WEEKLY What changed: See the new instructions.   Potassium Citrate 15 MEQ (1620 MG) Tbcr Take 1 tablet by mouth 2 (two) times daily.   promethazine 25 MG tablet Commonly known as: PHENERGAN Take 0.5 tablets (12.5 mg total) by mouth every 6 (six) hours as needed for nausea or vomiting. What changed: how much to take   QUEtiapine 100 MG tablet Commonly known as: SEROQUEL Take 100 mg by mouth at bedtime.   sodium bicarbonate 650 MG tablet Take 1 tablet (650 mg total) by mouth 2 (two) times daily.   tamsulosin 0.4 MG Caps capsule Commonly known as: FLOMAX Take  1 capsule (0.4 mg total) by mouth daily after supper.   Evaristo Bury FlexTouch 200 UNIT/ML FlexTouch Pen Generic drug: insulin degludec Inject 80 Units into the skin at bedtime.   True Metrix Blood Glucose Test test strip Generic drug: glucose blood CHECK BLOOD SUGAR 4 TIMES DAILY.   TRUEplus Insulin Syringe 31G X 5/16" 1 ML Misc Generic drug: Insulin Syringe-Needle U-100 AS DIRECTED   venlafaxine XR 37.5 MG 24 hr capsule Commonly known as: EFFEXOR-XR Take 1 capsule (37.5 mg total) by mouth daily.        Allergies:  Allergies  Allergen Reactions   Gabapentin Other (See Comments)  Increased blood sugar    Family History: Family History  Problem Relation Age of Onset   Hypertension Father    Diabetes Father    Heart disease Father    Heart disease Maternal Grandfather    Heart attack Other    Heart disease Brother        both brothers have heart disease   Hypertension Sister     Social History:  reports that he has been smoking cigarettes. He has a 35.00 pack-year smoking history. He has never used smokeless tobacco. He reports that he does not drink alcohol and does not use drugs.  ROS: All other review of systems were reviewed and are negative except what is noted above in HPI  Physical Exam: BP 101/70   Pulse (!) 112   Constitutional:  Alert and oriented, No acute distress. HEENT: Brilliant AT, moist mucus membranes.  Trachea midline, no masses. Cardiovascular: No clubbing, cyanosis, or edema. Respiratory: Normal respiratory effort, no increased work of breathing. GI: Abdomen is soft, nontender, nondistended, no abdominal masses GU: No CVA tenderness.  Lymph: No cervical or inguinal lymphadenopathy. Skin: No rashes, bruises or suspicious lesions. Neurologic: Grossly intact, no focal deficits, moving all 4 extremities. Psychiatric: Normal mood and affect.  Laboratory Data: Lab Results  Component Value Date   WBC 7.6 10/02/2022   HGB 12.2 (L) 10/02/2022   HCT  36.8 (L) 10/02/2022   MCV 90.9 10/02/2022   PLT 289 10/02/2022    Lab Results  Component Value Date   CREATININE 1.30 (H) 10/08/2022    No results found for: "PSA"  No results found for: "TESTOSTERONE"  Lab Results  Component Value Date   HGBA1C 9.0 (H) 10/02/2022    Urinalysis    Component Value Date/Time   COLORURINE YELLOW 07/21/2020 2356   APPEARANCEUR Clear 09/20/2022 1158   LABSPEC 1.020 07/21/2020 2356   PHURINE 6.0 07/21/2020 2356   GLUCOSEU Negative 09/20/2022 1158   HGBUR SMALL (A) 07/21/2020 2356   BILIRUBINUR Negative 09/20/2022 1158   KETONESUR >80 (A) 07/21/2020 2356   PROTEINUR Negative 09/20/2022 1158   PROTEINUR NEGATIVE 07/21/2020 2356   UROBILINOGEN 0.2 12/21/2014 0845   NITRITE Negative 09/20/2022 1158   NITRITE NEGATIVE 07/21/2020 2356   LEUKOCYTESUR Negative 09/20/2022 1158   LEUKOCYTESUR NEGATIVE 07/21/2020 2356    Lab Results  Component Value Date   LABMICR Comment 09/20/2022   WBCUA None seen 07/25/2020   LABEPIT None seen 07/25/2020   MUCUS Present 07/25/2020   BACTERIA Few 07/25/2020    Pertinent Imaging:  Results for orders placed during the hospital encounter of 09/20/22  Abdomen 1 view (KUB)  Narrative CLINICAL DATA:  Nephrolithiasis  EXAM: ABDOMEN - 1 VIEW  COMPARISON:  12/26/2021  FINDINGS: The bowel gas pattern is normal. 1.5 cm calcification overlies the right kidney. Punctate calcification overlies the left kidney. These findings can be followed with CT.  IMPRESSION: Bilateral nephrolithiasis.   Electronically Signed By: Layla Maw M.D. On: 09/22/2022 13:24  No results found for this or any previous visit.  No results found for this or any previous visit.  No results found for this or any previous visit.  Results for orders placed during the hospital encounter of 12/18/20  Ultrasound renal complete  Narrative CLINICAL DATA:  Nephrolithiasis.  EXAM: RENAL / URINARY TRACT ULTRASOUND  COMPLETE  COMPARISON:  September 14, 2020.  FINDINGS: Right Kidney:  Renal measurements: 11.9 x 6.0 x 6.3 cm = volume: 239 mL. Probable 12 mm nonobstructive calculus seen  in midpole collecting system. Echogenicity within normal limits. No mass or hydronephrosis visualized.  Left Kidney:  Renal measurements: 13.4 x 5.7 x 5.6 cm = volume: 224 mL. Probable nonobstructive 7 mm calculus seen in midpole collecting system. Echogenicity within normal limits. No mass or hydronephrosis visualized.  Bladder:  Appears normal for degree of bladder distention. Bilateral ureteral jets are noted.  Other:  None.  IMPRESSION: Probable bilateral nonobstructive nephrolithiasis. No hydronephrosis or renal obstruction is noted.   Electronically Signed By: Lupita Raider M.D. On: 12/18/2020 13:39  No valid procedures specified. No results found for this or any previous visit.  Results for orders placed in visit on 09/11/22  CT RENAL STONE STUDY  Narrative CLINICAL DATA:  Bilateral flank pain, history of calculi  EXAM: CT ABDOMEN AND PELVIS WITHOUT CONTRAST  TECHNIQUE: Multidetector CT imaging of the abdomen and pelvis was performed following the standard protocol without IV contrast.  RADIATION DOSE REDUCTION: This exam was performed according to the departmental dose-optimization program which includes automated exposure control, adjustment of the mA and/or kV according to patient size and/or use of iterative reconstruction technique.  COMPARISON:  09/14/2020 and radiographs from 12/26/2021  FINDINGS: Lower chest: Unremarkable  Hepatobiliary: Unremarkable  Pancreas: Unremarkable  Spleen: Unremarkable  Adrenals/Urinary Tract: Stable 3.7 by 4.1 cm right adrenal myelolipoma, image 19 series 2. No signs of interval hematoma. This lesion is benign and does not require further imaging workup.  Cluster calculi in the right kidney lower pole measuring up to 1.1 cm in long  axis. There is also a 2 mm right kidney upper pole nonobstructive renal calculus.  Two 2 mm left renal calculi are present.  No hydronephrosis or hydroureter.  No ureteral or bladder calculus.  Stomach/Bowel: Unremarkable.  Normal appendix.  Vascular/Lymphatic: Atherosclerosis is present, including aortoiliac atherosclerotic disease.  Reproductive: Borderline prostatomegaly.  Other: No supplemental non-categorized findings.  Musculoskeletal: Mild lumbar spondylosis and degenerative disc disease.  IMPRESSION: 1. Bilateral nonobstructive nephrolithiasis. 2. Stable 4.1 cm right adrenal myelolipoma. This lesion is benign and does not require further imaging workup. 3. Borderline prostatomegaly. 4. Mild lumbar spondylosis and degenerative disc disease. 5. Aortic atherosclerosis.  Aortic Atherosclerosis (ICD10-I70.0).   Electronically Signed By: Gaylyn Rong M.D. On: 09/11/2022 15:04   Assessment & Plan:    1. Nephrolithiasis -followup 2 weeks with KUB - Urinalysis, Routine w reflex microscopic  2. Acute midline low back pain with left-sided sciatica -flexeril prn     No follow-ups on file.  Wilkie Aye, MD  [facility] [place]

## 2022-11-12 ENCOUNTER — Encounter: Payer: Self-pay | Admitting: Urology

## 2022-11-12 NOTE — Progress Notes (Signed)
Assessment/Plan:   1.  Tremor  -Reassured no evidence of Parkinson's disease.  Patient was worried about that because of family history.  Also reassured him that most Parkinson's is not genetic (85% is idiopathic) although he certainly has a strong fam hx of such.  Information given to PF PDGeneRation study if he wants to know if he has a genetic risk factor for developing Parkinson's in the future.  -Did not see much tremor today, but patient certainly has reason to have tremor.  His A1c is certainly undesirable.  He is on numerous tremor inducing medications, and we talked about this today.  We talked about the fact that Phenergan use, and the frequency that he is using it, can cause parkinsonism.  Perhaps Zofran can be an alternative for him, but we will certainly leave this up to prescribing physician.  -check TSH   2.  IBD  -generally diarrhea predominant but has constipation that has required hospitalization (on opioids)  3.  Urinary retention  -now with bladder cath  4.  History of ? hemifacial spasm   -Patient reports that this is the reason that he is on the Keppra.   5.  Follow-up as needed Subjective:   Shaun Harris was seen today in the movement disorders clinic for neurologic consultation at the request of Simba, Artino, DO.  Pt with mother supplements hx.  The consultation is for the evaluation of left hand tremor.  Medical records made available to me are reviewed.  Patient apparently worried because of family history of Parkinson's disease in father, paternal aunt and uncle.  Patient's other medical history includes uncontrolled diabetes mellitus, IBD, migraine, severe sleep apnea, COPD, chronic pain.  Pt on a number of opioids daily for chronic HA per pt  Tremor: Yes.     How long has it been going on? 1 year ago, started in L hand but some in the R hand now (less than the L)  At rest or with activation?  both  Fam hx of tremor?  Yes.   (Dad/paternal aunt and uncle with  Parkinsons Disease and dad and paternal aunt had tremor) - dad was about 32 at age of dx  Affected by caffeine:  doesn't drink any  Affected by alcohol:  doesn't drink any  Affected by stress:  Yes.    Affected by fatigue:  No.  Spills soup if on spoon:  No. Bc he is R handed  Tremor inducing meds:  albuterol; Advair; opioids (MS Contin/oxycodone/Percocet - just stopped the percocet); Phenergan (takes it at least 2-3 days/week); quetiapine; Effexor; Wellbutrin  Other Specific Symptoms:  Voice: no change Sleep:   Vivid Dreams:  No.  Acting out dreams:  No. Wet Pillows: Yes.   Postural symptoms:  Yes.  , some off balance due to knee issues  Falls?  No. Bradykinesia symptoms: no bradykinesia noted Loss of smell:  No. Loss of taste:  "sometimes" Urinary Incontinence:  had retention; has a new bladder cath now and is undergoing w/u (cath just put in last Friday) Difficulty Swallowing:  occ with meats Handwriting, micrographia: No. Trouble with ADL's:  No.  Trouble buttoning clothing: No. N/V:  Yes.  , when "kidneys are acting up with kidney stones" and also had some with the urinary retention, before had the cath placed Lightheaded:  No.  Syncope: No. Diplopia:  had this 25 years ago and resolved; now vision just blurried  Pt states that he is on keppra.  Placed on  it in 1997 and states it was to "keep my face from twitching."  He has tried to d/c in the past without succss   The last neuroimaging of the brain that I have was MRI brain from 2008 and CT head from 2012.  MRI brain back in 2008 demonstrated few scattered T2 hyperintensities.  CT head in 2012 was unremarkable.    ALLERGIES:   Allergies  Allergen Reactions   Gabapentin Other (See Comments)    Increased blood sugar    CURRENT MEDICATIONS:  Current Outpatient Medications  Medication Instructions   acetaminophen (TYLENOL) 1,000 mg, Oral, Every 6 hours PRN   Aimovig 70 mg, Subcutaneous, Every 30 days   albuterol  (VENTOLIN HFA) 108 (90 Base) MCG/ACT inhaler inhale TWO puffs by MOUTH every SIX hours IN THE LEFT EAR needed FOR shortness of breath   ALPRAZolam (XANAX) 0.5 mg, Oral, 3 times daily   ascorbic acid (VITAMIN C) 500 mg, Oral, Daily   aspirin EC 325 mg, Oral, Daily at bedtime   atorvastatin (LIPITOR) 20 mg, Oral, Daily   Blood Glucose Monitoring Suppl w/Device KIT Test glucose 4 times daily. E11.9   buPROPion (WELLBUTRIN SR) 150 mg, Oral, 2 times daily   cyclobenzaprine (FLEXERIL) 5 mg, Oral, 3 times daily PRN   enalapril (VASOTEC) 20 mg, Oral, 2 times daily   fluticasone-salmeterol (ADVAIR) 250-50 MCG/ACT AEPB inhale ONE PUFF by MOUTH every 12 hours. <<rinse MOUTH AFTER use>>   furosemide (LASIX) 20 mg, Oral, Daily   hydrOXYzine (ATARAX) 50 mg, Oral, Daily PRN   insulin aspart (NOVOLOG) 14-20 Units, Subcutaneous, 3 times daily with meals   levETIRAcetam (KEPPRA) 1,500 mg, Oral, 2 times daily   Mesalamine (ASACOL) 400 MG CPDR DR capsule TAKE ONE CAPSULE BY MOUTH FOUR TIMES DAILY   morphine (MS CONTIN) 30 mg, Oral, 2 times daily, May fill 60 days after 09/30/2017   Multiple Vitamins-Minerals (CENTRUM PO) 1 tablet, Oral, Daily   ondansetron (ZOFRAN) 4 mg, Oral, Every 8 hours PRN   Oxycodone HCl 10 MG TABS Take 1 tablet by mouth 4 times a day as needed for pain. Do not drive or operate machinery while on this medicine.   OZEMPIC, 2 MG/DOSE, 8 MG/3ML SOPN inject 2mg  SUBCUTANEOUSLY WEEKLY   polyethylene glycol (MIRALAX) 17 g, Oral, Daily   Potassium Citrate 15 MEQ (1620 MG) TBCR 1 tablet, Oral, 2 times daily   promethazine (PHENERGAN) 12.5 mg, Oral, Every 6 hours PRN   QUEtiapine (SEROQUEL) 100 mg, Oral, Daily at bedtime   sodium bicarbonate 650 mg, Oral, 2 times daily   tamsulosin (FLOMAX) 0.4 mg, Oral, Daily after supper   tamsulosin (FLOMAX) 0.4 mg, Oral, Daily   traZODone (DESYREL) 100 mg, Oral, Daily at bedtime   Tresiba FlexTouch 80 Units, Subcutaneous, Daily at bedtime   TRUE METRIX  BLOOD GLUCOSE TEST test strip CHECK BLOOD SUGAR 4 TIMES DAILY.   TRUEPLUS INSULIN SYRINGE 31G X 5/16" 1 ML MISC See admin instructions   venlafaxine XR (EFFEXOR-XR) 37.5 mg, Oral, Daily    Objective:   PHYSICAL EXAMINATION:    VITALS:   Vitals:   11/19/22 0924  BP: 136/78  Pulse: 62  SpO2: 97%  Weight: 231 lb 6.4 oz (105 kg)  Height: 5\' 7"  (1.702 m)    GEN:  The patient appears stated age and is in NAD. HEENT:  Normocephalic, atraumatic.  The mucous membranes are moist. The superficial temporal arteries are without ropiness or tenderness. CV:  RRR Lungs:  CTAB Neck/HEME:  There are no carotid bruits bilaterally.  Neurological examination:  Orientation: The patient is alert and oriented x3.  Cranial nerves: There is good facial symmetry.  Extraocular muscles are intact. The visual fields are full to confrontational testing. The speech is fluent and clear. Soft palate rises symmetrically and there is no tongue deviation. Hearing is intact to conversational tone. Sensation: Sensation is intact to light touch throughout (facial, trunk, extremities). Vibration is intact at the bilateral big toe. There is no extinction with double simultaneous stimulation.  Motor: Strength is 5/5 in the bilateral upper and lower extremities.   Shoulder shrug is equal and symmetric.  There is no pronator drift. Deep tendon reflexes: Deep tendon reflexes are 1/4 at the bilateral biceps, triceps, brachioradialis, patella and achilles. Plantar responses are downgoing bilaterally.  Movement examination: Tone: There is normal tone in the bilateral upper extremities.  The tone in the lower extremities is normal.  Abnormal movements: No rest tremor, even with distraction procedures.  He has no postural tremor.  He does have intention tremor, left greater than right, worse when given a weight.  This is mild. Coordination:  There is no decremation with RAM's, with any form of RAMS, including alternating  supination and pronation of the forearm, hand opening and closing, finger taps, heel taps and toe taps.  Gait and Station: The patient pushes off to arise.  He does not shuffle.  He has good armswing. I have reviewed and interpreted the following labs independently   Chemistry      Component Value Date/Time   NA 136 11/15/2022 0636   NA 140 10/08/2022 1322   K 5.2 (H) 11/15/2022 0636   CL 99 11/15/2022 0636   CO2 27 11/15/2022 0636   BUN 26 (H) 11/15/2022 0636   BUN 29 (H) 10/08/2022 1322   CREATININE 1.60 (H) 11/15/2022 0636   CREATININE 0.78 04/01/2013 1046      Component Value Date/Time   CALCIUM 9.4 11/15/2022 0636   ALKPHOS 82 11/15/2022 0636   AST 20 11/15/2022 0636   ALT 21 11/15/2022 0636   BILITOT 0.6 11/15/2022 0636   BILITOT <0.2 10/01/2021 1004      No results found for: "TSH" Lab Results  Component Value Date   WBC 9.6 11/15/2022   HGB 14.1 11/15/2022   HCT 42.5 11/15/2022   MCV 93.0 11/15/2022   PLT 259 11/15/2022   Lab Results  Component Value Date   HGBA1C 9.0 (H) 10/02/2022      Total time spent on today's visit was 45 minutes, including both face-to-face time and nonface-to-face time.  Time included that spent on review of records (prior notes available to me/labs/imaging if pertinent), discussing treatment and goals, answering patient's questions and coordinating care.  Cc:  Tommie Sams, DO

## 2022-11-13 ENCOUNTER — Other Ambulatory Visit: Payer: Self-pay | Admitting: Family Medicine

## 2022-11-13 DIAGNOSIS — J449 Chronic obstructive pulmonary disease, unspecified: Secondary | ICD-10-CM

## 2022-11-13 DIAGNOSIS — E785 Hyperlipidemia, unspecified: Secondary | ICD-10-CM

## 2022-11-15 ENCOUNTER — Encounter (HOSPITAL_COMMUNITY): Payer: Self-pay | Admitting: Emergency Medicine

## 2022-11-15 ENCOUNTER — Telehealth: Payer: Self-pay

## 2022-11-15 ENCOUNTER — Emergency Department (HOSPITAL_COMMUNITY): Payer: Medicare Other

## 2022-11-15 ENCOUNTER — Other Ambulatory Visit: Payer: Self-pay

## 2022-11-15 ENCOUNTER — Emergency Department (HOSPITAL_COMMUNITY)
Admission: EM | Admit: 2022-11-15 | Discharge: 2022-11-15 | Disposition: A | Discharge status: Home / Self Care | Payer: Medicare Other | Attending: Emergency Medicine | Admitting: Emergency Medicine

## 2022-11-15 DIAGNOSIS — K5903 Drug induced constipation: Secondary | ICD-10-CM

## 2022-11-15 DIAGNOSIS — R339 Retention of urine, unspecified: Secondary | ICD-10-CM | POA: Diagnosis present

## 2022-11-15 DIAGNOSIS — Z794 Long term (current) use of insulin: Secondary | ICD-10-CM | POA: Insufficient documentation

## 2022-11-15 DIAGNOSIS — K5900 Constipation, unspecified: Secondary | ICD-10-CM | POA: Diagnosis not present

## 2022-11-15 DIAGNOSIS — Z7982 Long term (current) use of aspirin: Secondary | ICD-10-CM | POA: Diagnosis not present

## 2022-11-15 LAB — URINALYSIS, ROUTINE W REFLEX MICROSCOPIC
Bilirubin Urine: NEGATIVE
Glucose, UA: NEGATIVE mg/dL
Hgb urine dipstick: NEGATIVE
Ketones, ur: NEGATIVE mg/dL
Leukocytes,Ua: NEGATIVE
Nitrite: NEGATIVE
Protein, ur: NEGATIVE mg/dL
Specific Gravity, Urine: 1.019 (ref 1.005–1.030)
pH: 6 (ref 5.0–8.0)

## 2022-11-15 LAB — CBC WITH DIFFERENTIAL/PLATELET
Abs Immature Granulocytes: 0.05 10*3/uL (ref 0.00–0.07)
Basophils Absolute: 0.1 10*3/uL (ref 0.0–0.1)
Basophils Relative: 1 %
Eosinophils Absolute: 0.2 10*3/uL (ref 0.0–0.5)
Eosinophils Relative: 2 %
HCT: 42.5 % (ref 39.0–52.0)
Hemoglobin: 14.1 g/dL (ref 13.0–17.0)
Immature Granulocytes: 1 %
Lymphocytes Relative: 17 %
Lymphs Abs: 1.6 10*3/uL (ref 0.7–4.0)
MCH: 30.9 pg (ref 26.0–34.0)
MCHC: 33.2 g/dL (ref 30.0–36.0)
MCV: 93 fL (ref 80.0–100.0)
Monocytes Absolute: 0.7 10*3/uL (ref 0.1–1.0)
Monocytes Relative: 7 %
Neutro Abs: 7 10*3/uL (ref 1.7–7.7)
Neutrophils Relative %: 72 %
Platelets: 259 10*3/uL (ref 150–400)
RBC: 4.57 MIL/uL (ref 4.22–5.81)
RDW: 12.7 % (ref 11.5–15.5)
WBC: 9.6 10*3/uL (ref 4.0–10.5)
nRBC: 0 % (ref 0.0–0.2)

## 2022-11-15 LAB — COMPREHENSIVE METABOLIC PANEL
ALT: 21 U/L (ref 0–44)
AST: 20 U/L (ref 15–41)
Albumin: 4.4 g/dL (ref 3.5–5.0)
Alkaline Phosphatase: 82 U/L (ref 38–126)
Anion gap: 10 (ref 5–15)
BUN: 26 mg/dL — ABNORMAL HIGH (ref 6–20)
CO2: 27 mmol/L (ref 22–32)
Calcium: 9.4 mg/dL (ref 8.9–10.3)
Chloride: 99 mmol/L (ref 98–111)
Creatinine, Ser: 1.6 mg/dL — ABNORMAL HIGH (ref 0.61–1.24)
GFR, Estimated: 50 mL/min — ABNORMAL LOW (ref >60)
Glucose, Bld: 110 mg/dL — ABNORMAL HIGH (ref 70–99)
Potassium: 5.2 mmol/L — ABNORMAL HIGH (ref 3.5–5.1)
Sodium: 136 mmol/L (ref 135–145)
Total Bilirubin: 0.6 mg/dL (ref 0.3–1.2)
Total Protein: 7.3 g/dL (ref 6.5–8.1)

## 2022-11-15 MED ORDER — POLYETHYLENE GLYCOL 3350 17 G PO PACK: 17.0000 g | PACK | Freq: Every day | ORAL | 0 refills | Status: AC

## 2022-11-15 MED ORDER — SODIUM ZIRCONIUM CYCLOSILICATE 5 G PO PACK
10.0000 g | PACK | Freq: Once | ORAL | Status: AC
  Administered 2022-11-15: 10 g via ORAL
  Filled 2022-11-15: qty 2

## 2022-11-15 MED ORDER — TAMSULOSIN HCL 0.4 MG PO CAPS: 0.4000 mg | ORAL_CAPSULE | Freq: Every day | ORAL | 0 refills | Status: AC

## 2022-11-15 MED ORDER — LACTATED RINGERS IV BOLUS
1000.0000 mL | Freq: Once | INTRAVENOUS | Status: AC
  Administered 2022-11-15: 1000 mL via INTRAVENOUS

## 2022-11-15 MED ORDER — IOHEXOL 300 MG/ML  SOLN
100.0000 mL | Freq: Once | INTRAMUSCULAR | Status: AC | PRN
  Administered 2022-11-15: 100 mL via INTRAVENOUS

## 2022-11-15 NOTE — ED Provider Notes (Signed)
[facility] EMERGENCY DEPARTMENT AT [facility] Provider Note   CSN: [id number] Arrival date & time: 11/15/22  7829     History {Add pertinent medical, surgical, social history, OB history to HPI:1} Chief Complaint  Patient presents with   Constipation    Shaun Harris is a 57 y.o. male.  57 yo M here with constipation and urinary retention. Last BM was Tuesday evening. Normally goes multiple times a day. Has a h/o UC on mesalamine. Also takes multiple opiates and ozempic. No vomiting. Has a h/o thrombosed hemorrhoid s/p excision in middle of January, delayed colonoscopy for same reason. Takes stool softeners and tried dolculax to see if it would help and it did not. States he tried to dig out the feces but there was none there.  Decreased UOP to almost nonexistent over the last 24 hours.   States he had similar episodes in the past with kidney stones.    Constipation      Home Medications Prior to Admission medications   Medication Sig Start Date End Date Taking? Authorizing Provider  acetaminophen (TYLENOL) 500 MG tablet Take 1,000 mg by mouth every 6 (six) hours as needed for moderate pain.    [provider]  AIMOVIG 70 MG/ML SOAJ Inject 70 mg into the skin every 30 (thirty) days. 07/17/20   [provider]  albuterol (VENTOLIN HFA) 108 (90 Base) MCG/ACT inhaler inhale TWO puffs by MOUTH every SIX hours IN THE LEFT EAR needed FOR shortness of breath 10/09/22   Tommie Sams, DO  ALPRAZolam (XANAX) 0.25 MG tablet Take 0.25 mg by mouth 3 (three) times daily.    [provider]  aspirin EC 325 MG tablet Take 325 mg by mouth at bedtime.    [provider]  atorvastatin (LIPITOR) 20 MG tablet TAKE ONE TABLET BY MOUTH EVERY DAY 07/22/22   Everlene Other G, DO  Blood Glucose Monitoring Suppl w/Device KIT Test glucose 4 times daily. E11.9 04/30/17   Merlyn Albert, MD  buPROPion [facility] SR) 150 MG 12 hr tablet TAKE ONE TABLET BY MOUTH TWICE  DAILY 08/22/22   Tommie Sams, DO  cyclobenzaprine (FLEXERIL) 5 MG tablet Take 1 tablet (5 mg total) by mouth 3 (three) times daily as needed for muscle spasms. 11/06/22   McKenzie, Mardene Celeste, MD  enalapril (VASOTEC) 20 MG tablet TAKE ONE TABLET BY MOUTH TWICE DAILY 10/21/22   Everlene Other G, DO  fluticasone-salmeterol (ADVAIR DISKUS) 250-50 MCG/ACT AEPB USE 1 INHALATION BY MOUTH EVERY 12 HOURS. <<RINSE MOUTH AFTER USE>>. 02/27/22   Tommie Sams, DO  furosemide (LASIX) 20 MG tablet TAKE ONE TABLET BY MOUTH EVERY DAY 09/03/22   Everlene Other G, DO  hydrOXYzine (ATARAX/VISTARIL) 50 MG tablet Take 50 mg by mouth daily as needed for anxiety. 06/27/20   [provider]  insulin aspart (NOVOLOG) 100 UNIT/ML injection Inject 14-20 Units into the skin 3 (three) times daily with meals. 10/30/22   Dani Gobble, NP  insulin degludec (TRESIBA FLEXTOUCH) 200 UNIT/ML FlexTouch Pen Inject 80 Units into the skin at bedtime. 10/30/22   Dani Gobble, NP  levETIRAcetam (KEPPRA) 500 MG tablet TAKE 3 TABLETS BY MOUTH TWICE DAILY. 08/08/22   Tommie Sams, DO  Mesalamine (ASACOL) 400 MG CPDR DR capsule TAKE ONE CAPSULE BY MOUTH FOUR TIMES DAILY 08/22/22   Tommie Sams, DO  morphine (MS CONTIN) 30 MG 12 hr tablet Take 1 tablet (30 mg total) by mouth 2 (two)  times daily. May fill 60 days after 09/30/2017 12/29/19   Laroy Apple M, DO  Multiple Vitamins-Minerals (CENTRUM PO) Take 1 tablet by mouth daily.    [provider]  ondansetron (ZOFRAN) 4 MG tablet Take 1 tablet (4 mg total) by mouth every 8 (eight) hours as needed for nausea or vomiting. 11/06/22   McKenzie, Mardene Celeste, MD  Oxycodone HCl 10 MG TABS Take 1 tablet by mouth 4 times a day as needed for pain. Do not drive or operate machinery while on this medicine. Patient taking differently: Take 10 mg by mouth 4 (four) times daily as needed (pain). Do not drive or operate machinery while on this medicine. 12/29/19   Annalee Genta, DO   oxyCODONE-acetaminophen (PERCOCET) 5-325 MG tablet Take 1 tablet by mouth every 4 (four) hours as needed. 11/06/22   McKenzie, Mardene Celeste, MD  OZEMPIC, 2 MG/DOSE, 8 MG/3ML SOPN inject 2mg  SUBCUTANEOUSLY WEEKLY Patient taking differently: Inject 2 mg into the skin every Wednesday. 08/27/22   Tommie Sams, DO  Potassium Citrate 15 MEQ (1620 MG) TBCR Take 1 tablet by mouth 2 (two) times daily. 01/01/22   McKenzie, Mardene Celeste, MD  promethazine (PHENERGAN) 25 MG tablet Take 0.5 tablets (12.5 mg total) by mouth every 6 (six) hours as needed for nausea or vomiting. Patient taking differently: Take 25 mg by mouth every 6 (six) hours as needed for nausea or vomiting. 07/25/20   McKenzie, Mardene Celeste, MD  QUEtiapine (SEROQUEL) 100 MG tablet Take 100 mg by mouth at bedtime. 09/19/22   [provider]  sodium bicarbonate 650 MG tablet Take 1 tablet (650 mg total) by mouth 2 (two) times daily. 01/01/22   McKenzie, Mardene Celeste, MD  tamsulosin (FLOMAX) 0.4 MG CAPS capsule Take 1 capsule (0.4 mg total) by mouth daily after supper. 11/06/22   McKenzie, Mardene Celeste, MD  TRUE METRIX BLOOD GLUCOSE TEST test strip CHECK BLOOD SUGAR 4 TIMES DAILY. 08/21/22   Tommie Sams, DO  TRUEPLUS INSULIN SYRINGE 31G X 5/16" 1 ML MISC AS DIRECTED 08/21/22   Everlene Other G, DO  venlafaxine XR (EFFEXOR-XR) 37.5 MG 24 hr capsule Take 1 capsule (37.5 mg total) by mouth daily. 07/02/21   Tommie Sams, DO  vitamin C (ASCORBIC ACID) 500 MG tablet Take 500 mg by mouth daily.    [provider]      Allergies    Gabapentin    Review of Systems   Review of Systems  Gastrointestinal:  Positive for constipation.    Physical Exam Updated Vital Signs BP (!) 132/98 (BP Location: Right Arm)   Pulse (!) 116   Temp 97.6 F (36.4 C) (Oral)   Resp 18   Ht 5\' 7"  (1.702 m)   Wt 101.3 kg   SpO2 99%   BMI 34.99 kg/m  Physical Exam Vitals and nursing note reviewed.  Constitutional:      Appearance: He is well-developed.  HENT:      Head: Normocephalic and atraumatic.     Mouth/Throat:     Mouth: Mucous membranes are moist.  Eyes:     Pupils: Pupils are equal, round, and reactive to light.  Cardiovascular:     Rate and Rhythm: Normal rate.  Pulmonary:     Effort: Pulmonary effort is normal. No respiratory distress.  Abdominal:     General: Abdomen is flat. There is no distension.  Musculoskeletal:        General: Normal range of motion.  Cervical back: Normal range of motion.  Skin:    General: Skin is warm and dry.  Neurological:     General: No focal deficit present.     Mental Status: He is alert.     ED Results / Procedures / Treatments   Labs (all labs ordered are listed, but only abnormal results are displayed) Labs Reviewed  CBC WITH DIFFERENTIAL/PLATELET  COMPREHENSIVE METABOLIC PANEL  URINALYSIS, ROUTINE W REFLEX MICROSCOPIC    EKG None  Radiology No results found.  Procedures Procedures  {Document cardiac monitor, telemetry assessment procedure when appropriate:1}  Medications Ordered in ED Medications  lactated ringers bolus 1,000 mL (has no administration in time range)    ED Course/ Medical Decision Making/ A&P   {   Click here for ABCD2, HEART and other calculatorsREFRESH Note before signing :1}                          Medical Decision Making Amount and/or Complexity of Data Reviewed Labs: ordered. Radiology: ordered.   ***  {Document critical care time when appropriate:1} {Document review of labs and clinical decision tools ie heart score, Chads2Vasc2 etc:1}  {Document your independent review of radiology images, and any outside records:1} {Document your discussion with family members, caretakers, and with consultants:1} {Document social determinants of health affecting pt's care:1} {Document your decision making why or why not admission, treatments were needed:1} Final Clinical Impression(s) / ED Diagnoses Final diagnoses:  None    Rx / DC Orders ED  Discharge Orders     None

## 2022-11-15 NOTE — ED Triage Notes (Signed)
Pt c/o constipation since Tuesday and urinary retention since Thursday morning. Pt states hx of same.

## 2022-11-15 NOTE — ED Notes (Signed)
Patient transported to CT 

## 2022-11-15 NOTE — ED Notes (Signed)
Pt states he hasn't been able to urinate since yesterday morning as well EDP made aware.

## 2022-11-15 NOTE — Telephone Encounter (Signed)
Return call to patient. Patient states he went to the hospital for urine retention and catheter placed 05/03. Patient states he is concern about his CT images done in the hospital. Patient states he is having bladder spasms and concern about the removal of his catheter. Patient is aware that a message will be sent to Dr. Ronne Binning

## 2022-11-15 NOTE — ED Provider Notes (Signed)
  Physical Exam  BP (!) 132/98 (BP Location: Right Arm)   Pulse (!) 116   Temp 97.6 F (36.4 C) (Oral)   Resp 18   Ht 5\' 7"  (1.702 m)   Wt 101.3 kg   SpO2 99%   BMI 34.99 kg/m   Physical Exam Constitutional:      General: He is not in acute distress.    Appearance: Normal appearance.  HENT:     Head: Normocephalic and atraumatic.     Nose: No congestion or rhinorrhea.  Eyes:     General:        Right eye: No discharge.        Left eye: No discharge.     Extraocular Movements: Extraocular movements intact.     Pupils: Pupils are equal, round, and reactive to light.  Cardiovascular:     Rate and Rhythm: Normal rate and regular rhythm.     Heart sounds: No murmur heard. Pulmonary:     Effort: No respiratory distress.     Breath sounds: No wheezing or rales.  Abdominal:     General: There is no distension.     Tenderness: There is no abdominal tenderness.  Musculoskeletal:        General: Normal range of motion.     Cervical back: Normal range of motion.  Skin:    General: Skin is warm and dry.  Neurological:     General: No focal deficit present.     Mental Status: He is alert.     Procedures  Procedures  ED Course / MDM    Medical Decision Making Amount and/or Complexity of Data Reviewed Labs: ordered. Radiology: ordered.  Risk Prescription drug management.   Patient received in handoff.  Urinary retention and constipation pending CT.  CT is overall reassuring and bladder scan with 890 cc of retained urine.  Foley catheter placed and will remain in place until outpatient urology follow-up.  Soapsuds enema performed and patient had large bowel movement successfully.  Patient was started on a bowel regimen of MiraLAX in the meantime.  Flomax sent for urinary retention and patient discharged with outpatient urology follow-up.       Glendora Score, MD 11/15/22 [id number]

## 2022-11-19 ENCOUNTER — Other Ambulatory Visit (INDEPENDENT_AMBULATORY_CARE_PROVIDER_SITE_OTHER): Payer: Medicare Other

## 2022-11-19 ENCOUNTER — Encounter: Payer: Self-pay | Admitting: Neurology

## 2022-11-19 ENCOUNTER — Ambulatory Visit (INDEPENDENT_AMBULATORY_CARE_PROVIDER_SITE_OTHER): Payer: Medicare Other | Admitting: Neurology

## 2022-11-19 VITALS — BP 136/78 | HR 62 | Ht 67.0 in | Wt 231.4 lb

## 2022-11-19 DIAGNOSIS — R251 Tremor, unspecified: Secondary | ICD-10-CM

## 2022-11-19 LAB — TSH: TSH: 1.57 u[IU]/mL (ref 0.35–5.50)

## 2022-11-19 NOTE — Patient Instructions (Signed)
Good news! !  You do not have Parkinsons Disease.    Your provider has requested that you have labwork completed today. The lab is located on the Second floor at [address], within the [facility] office. When you get off the elevator, turn right and go in the [facility] [address]; the first brown door on the left.  Tell the ladies behind the desk that you are there for lab work. If you are not called within 15 minutes please check with the front desk.   Once you complete your labs you are free to go. You will receive a call or message via MyChart with your lab results.

## 2022-11-26 NOTE — Telephone Encounter (Signed)
Ok to keep 6/19 appointment or do you need him to come in sooner?

## 2022-11-28 NOTE — Telephone Encounter (Signed)
Made patient aware that he has a follow up with Dr. Ronne Binning 06/19. Patient voiced understanding

## 2022-11-29 ENCOUNTER — Ambulatory Visit (INDEPENDENT_AMBULATORY_CARE_PROVIDER_SITE_OTHER): Payer: Medicare Other | Admitting: Nurse Practitioner

## 2022-11-29 ENCOUNTER — Encounter: Payer: Self-pay | Admitting: Nurse Practitioner

## 2022-11-29 VITALS — BP 94/59 | HR 102 | Ht 67.0 in | Wt 230.4 lb

## 2022-11-29 DIAGNOSIS — E1165 Type 2 diabetes mellitus with hyperglycemia: Secondary | ICD-10-CM | POA: Diagnosis not present

## 2022-11-29 DIAGNOSIS — F17200 Nicotine dependence, unspecified, uncomplicated: Secondary | ICD-10-CM | POA: Diagnosis not present

## 2022-11-29 DIAGNOSIS — Z794 Long term (current) use of insulin: Secondary | ICD-10-CM | POA: Diagnosis not present

## 2022-11-29 NOTE — Progress Notes (Addendum)
Endocrinology Follow Up Note       11/29/2022, 8:24 AM   Subjective:    Patient ID: Shaun Harris, male    DOB: [birth year].  Shaun Harris is being seen in follow up after being seen in consultation for management of currently uncontrolled symptomatic diabetes requested by  Tommie Sams, DO.   Past Medical History:  Diagnosis Date   Anxiety    Arthritis    Cerebral vasculitis    Chronic headaches    constant since possible stroke in 1997,"Vasculitis of brain with increased pressure of brain ".   COPD (chronic obstructive pulmonary disease) (HCC)    COPD (chronic obstructive pulmonary disease) (HCC)    Diabetes mellitus    Hyperlipidemia    Hypertension    Hypertriglyceridemia    Sleep apnea    has but doesnt use, cannot tolerate; PCP aware   Stroke [facility])    pt states,"they arent sure if i had a stroke in 1997, there is still a question about that with my doctors". No deficits.   Tobacco abuse    Ulcerative colitis (HCC)    Ulcerative colitis (HCC)    Venous stasis     Past Surgical History:  Procedure Laterality Date   BIOPSY  10/25/2016   Procedure: BIOPSY;  Surgeon: Malissa Hippo, MD;  [facility];  Service: Endoscopy;;  colon   CERVICAL SPINE SURGERY  2008   x2-last surgery 04/24/2016.   COLONOSCOPY WITH PROPOFOL N/A 10/25/2016   Procedure: COLONOSCOPY WITH PROPOFOL;  Surgeon: Malissa Hippo, MD;  Location: [facility];  Service: Endoscopy;  Laterality: N/A;  7:30   EXTRACORPOREAL SHOCK WAVE LITHOTRIPSY Left 08/08/2020   Procedure: EXTRACORPOREAL SHOCK WAVE LITHOTRIPSY (ESWL);  Surgeon: Malen Gauze, MD;  Location: [facility];  Service: Urology;  Laterality: Left;   KNEE ARTHROSCOPY  02/25/2012   Procedure: ARTHROSCOPY KNEE;  Surgeon: Darreld Mclean, MD;  Location: [facility];  Service: Orthopedics;  Laterality: Right;   KNEE ARTHROSCOPY WITH MEDIAL MENISECTOMY Left 12/23/2014    Procedure: LEFT KNEE ARTHROSCOPY WITH PARTIAL MENISECTOMY;  Surgeon: Darreld Mclean, MD;  Location: [facility];  Service: Orthopedics;  Laterality: Left;   POLYPECTOMY  10/25/2016   Procedure: POLYPECTOMY;  Surgeon: Malissa Hippo, MD;  [facility];  Service: Endoscopy;;  colon   SHOULDER ARTHROSCOPY  2004   left shoulder   SHOULDER ARTHROSCOPY  2008   right shoulder    Social History   Socioeconomic History   Marital status: Widowed    Spouse name: Not on file   Number of children: Not on file   Years of education: Not on file   Highest education level: Not on file  Occupational History   Not on file  Tobacco Use   Smoking status: Every Day    Packs/day: 1.00    Years: 35.00    Additional pack years: 0.00    Total pack years: 35.00    Types: Cigarettes   Smokeless tobacco: Never  Vaping Use   Vaping Use: Never used  Substance and Sexual Activity   Alcohol use: No   Drug use: No   Sexual activity: Yes  Birth control/protection: None  Other Topics Concern   Not on file  Social History Narrative   Disabled   Right handed   Social Determinants of Health   Financial Resource Strain: Not on file  Food Insecurity: Not on file  Transportation Needs: Not on file  Physical Activity: Not on file  Stress: Not on file  Social Connections: Not on file    Family History  Problem Relation Age of Onset   Hypertension Father    Diabetes Father    Heart disease Father    Parkinson's disease Father    Hypertension Sister    Heart disease Brother        both brothers have heart disease   Heart disease Maternal Grandfather    Lupus Daughter    Neurofibromatosis Son    Parkinson's disease Paternal Aunt    Parkinson's disease Paternal Uncle    Heart attack Other     Outpatient Encounter Medications as of 11/29/2022  Medication Sig   acetaminophen (TYLENOL) 500 MG tablet Take 1,000 mg by mouth every 6 (six) hours as needed for moderate pain.   AIMOVIG 70 MG/ML  SOAJ Inject 70 mg into the skin every 30 (thirty) days.   albuterol (VENTOLIN HFA) 108 (90 Base) MCG/ACT inhaler inhale TWO puffs by MOUTH every SIX hours IN THE LEFT EAR needed FOR shortness of breath   ALPRAZolam (XANAX) 0.25 MG tablet Take 0.5 mg by mouth 3 (three) times daily.   aspirin EC 325 MG tablet Take 325 mg by mouth at bedtime.   atorvastatin (LIPITOR) 20 MG tablet TAKE ONE TABLET BY MOUTH EVERY DAY   Blood Glucose Monitoring Suppl w/Device KIT Test glucose 4 times daily. E11.9   buPROPion (WELLBUTRIN SR) 150 MG 12 hr tablet TAKE ONE TABLET BY MOUTH TWICE DAILY   cyclobenzaprine (FLEXERIL) 5 MG tablet Take 1 tablet (5 mg total) by mouth 3 (three) times daily as needed for muscle spasms.   enalapril (VASOTEC) 20 MG tablet TAKE ONE TABLET BY MOUTH TWICE DAILY   fluticasone-salmeterol (ADVAIR) 250-50 MCG/ACT AEPB inhale ONE PUFF by MOUTH every 12 hours. <<rinse MOUTH AFTER use>>   furosemide (LASIX) 20 MG tablet TAKE ONE TABLET BY MOUTH EVERY DAY   hydrOXYzine (ATARAX/VISTARIL) 50 MG tablet Take 50 mg by mouth daily as needed for anxiety.   insulin aspart (NOVOLOG) 100 UNIT/ML injection Inject 14-20 Units into the skin 3 (three) times daily with meals.   insulin degludec (TRESIBA FLEXTOUCH) 200 UNIT/ML FlexTouch Pen Inject 80 Units into the skin at bedtime.   levETIRAcetam (KEPPRA) 500 MG tablet TAKE 3 TABLETS BY MOUTH TWICE DAILY.   Mesalamine (ASACOL) 400 MG CPDR DR capsule TAKE ONE CAPSULE BY MOUTH FOUR TIMES DAILY   morphine (MS CONTIN) 30 MG 12 hr tablet Take 1 tablet (30 mg total) by mouth 2 (two) times daily. May fill 60 days after 09/30/2017   Multiple Vitamins-Minerals (CENTRUM PO) Take 1 tablet by mouth daily.   ondansetron (ZOFRAN) 4 MG tablet Take 1 tablet (4 mg total) by mouth every 8 (eight) hours as needed for nausea or vomiting.   Oxycodone HCl 10 MG TABS Take 1 tablet by mouth 4 times a day as needed for pain. Do not drive or operate machinery while on this medicine.  (Patient taking differently: Take 10 mg by mouth 4 (four) times daily as needed (pain). Do not drive or operate machinery while on this medicine.)   polyethylene glycol (MIRALAX) 17 g packet Take 17 g by  mouth daily.   Potassium Citrate 15 MEQ (1620 MG) TBCR Take 1 tablet by mouth 2 (two) times daily.   promethazine (PHENERGAN) 25 MG tablet Take 0.5 tablets (12.5 mg total) by mouth every 6 (six) hours as needed for nausea or vomiting. (Patient taking differently: Take 25 mg by mouth every 6 (six) hours as needed for nausea or vomiting.)   QUEtiapine (SEROQUEL) 100 MG tablet Take 100 mg by mouth at bedtime.   sodium bicarbonate 650 MG tablet Take 1 tablet (650 mg total) by mouth 2 (two) times daily.   tamsulosin (FLOMAX) 0.4 MG CAPS capsule Take 1 capsule (0.4 mg total) by mouth daily after supper.   tamsulosin (FLOMAX) 0.4 MG CAPS capsule Take 1 capsule (0.4 mg total) by mouth daily.   traZODone (DESYREL) 100 MG tablet Take 100 mg by mouth at bedtime.   TRUE METRIX BLOOD GLUCOSE TEST test strip CHECK BLOOD SUGAR 4 TIMES DAILY.   TRUEPLUS INSULIN SYRINGE 31G X 5/16" 1 ML MISC AS DIRECTED   venlafaxine XR (EFFEXOR-XR) 37.5 MG 24 hr capsule Take 1 capsule (37.5 mg total) by mouth daily.   vitamin C (ASCORBIC ACID) 500 MG tablet Take 500 mg by mouth daily.   [DISCONTINUED] OZEMPIC, 2 MG/DOSE, 8 MG/3ML SOPN inject 2mg  SUBCUTANEOUSLY WEEKLY (Patient taking differently: Inject 2 mg into the skin every Wednesday.)   No facility-administered encounter medications on file as of 11/29/2022.    ALLERGIES: Allergies  Allergen Reactions   Gabapentin Other (See Comments)    Increased blood sugar    VACCINATION STATUS: Immunization History  Administered Date(s) Administered   Influenza Split 04/01/2013   Influenza,inj,Quad PF,6+ Mos 03/30/2014, 03/22/2015, 03/21/2016, 04/07/2017, 03/30/2018, 07/02/2021, 07/03/2022   Pneumococcal Polysaccharide-23 07/02/2018   Pneumococcal-Unspecified 06/26/2004     Diabetes He presents for his follow-up diabetic visit. He has type 2 diabetes mellitus. Onset time: diagnosed at approx age of 10. His disease course has been improving. Hypoglycemia symptoms include nervousness/anxiousness, sweats and tremors. Associated symptoms include blurred vision and fatigue. There are no hypoglycemic complications. Symptoms are stable. Diabetic complications include a CVA, heart disease, nephropathy and peripheral neuropathy. Risk factors for coronary artery disease include tobacco exposure, sedentary lifestyle, male sex, obesity, hypertension, diabetes mellitus, dyslipidemia and family history. Current diabetic treatment includes intensive insulin program (and Ozempic). He is compliant with treatment most of the time. His weight is stable. He is following a generally unhealthy diet. When asked about meal planning, he reported none. He has not had a previous visit with a dietitian. He rarely participates in exercise. His home blood glucose trend is decreasing steadily. (He presents today with his meter and logs showing improving glycemic profile.  He was not due for another A1c today.  He does have some mild hypoglycemia noted randomly.  He has been taking 120 units of Tresiba due to misunderstanding of directions at last visit.  Analysis of his meter shows 7-day average of 147, 14-day average of 161, 30-day average of 162.  He also went to ED for severe constipation, was found to be impacted, requiring enemas and also had urinary retention (currently has foley catheter).) An ACE inhibitor/angiotensin II receptor blocker is being taken. He does not see a podiatrist (saw in distant past).Eye exam is not current.     Review of systems  Constitutional: + Minimally fluctuating body weight,  current Body mass index is 36.09 kg/m. , no fatigue, no subjective hyperthermia, no subjective hypothermia, + using foley catheter due to urinary retention- has follow  up with urology coming  up Eyes: no blurry vision, no xerophthalmia ENT: no sore throat, no nodules palpated in throat, no dysphagia/odynophagia, no hoarseness Cardiovascular: no chest pain, no shortness of breath, no palpitations, no leg swelling Respiratory: no cough, no shortness of breath Gastrointestinal: no nausea/vomiting/diarrhea, + constipation Musculoskeletal: no muscle/joint aches Skin: no rashes, no hyperemia Neurological: no tremors, no numbness, no tingling, no dizziness Psychiatric: no depression, no anxiety  Objective:     BP (!) 94/59 (BP Location: Left Arm, Patient Position: Sitting, Cuff Size: Large)   Pulse (!) 102   Ht 5\' 7"  (1.702 m)   Wt 230 lb 6.4 oz (104.5 kg)   BMI 36.09 kg/m   Wt Readings from Last 3 Encounters:  11/29/22 230 lb 6.4 oz (104.5 kg)  11/19/22 231 lb 6.4 oz (105 kg)  11/15/22 223 lb 6.4 oz (101.3 kg)     BP Readings from Last 3 Encounters:  11/29/22 (!) 94/59  11/19/22 136/78  11/15/22 (!) 132/98     Physical Exam- Limited  Constitutional:  Body mass index is 36.09 kg/m. , not in acute distress, normal state of mind, has foley catheter in place from recent urinary retention Eyes:  EOMI, no exophthalmos Musculoskeletal: no gross deformities, strength intact in all four extremities, no gross restriction of joint movements Skin:  no rashes, + hyperemia to BLE, nicotinic discoloration to bilateral fingertips Neurological: no tremor with outstretched hands   Diabetic Foot Exam - Simple   No data filed     CMP ( most recent) CMP     Component Value Date/Time   NA 136 11/15/2022 0636   NA 140 10/08/2022 1322   K 5.2 (H) 11/15/2022 0636   CL 99 11/15/2022 0636   CO2 27 11/15/2022 0636   GLUCOSE 110 (H) 11/15/2022 0636   BUN 26 (H) 11/15/2022 0636   BUN 29 (H) 10/08/2022 1322   CREATININE 1.60 (H) 11/15/2022 0636   CREATININE 0.78 04/01/2013 1046   CALCIUM 9.4 11/15/2022 0636   PROT 7.3 11/15/2022 0636   PROT 6.4 10/01/2021 1004   ALBUMIN 4.4  11/15/2022 0636   ALBUMIN 4.6 10/01/2021 1004   AST 20 11/15/2022 0636   ALT 21 11/15/2022 0636   ALKPHOS 82 11/15/2022 0636   BILITOT 0.6 11/15/2022 0636   BILITOT <0.2 10/01/2021 1004   GFRNONAA 50 (L) 11/15/2022 0636   GFRAA 98 03/28/2020 0939     Diabetic Labs (most recent): Lab Results  Component Value Date   HGBA1C 9.0 (H) 10/02/2022   HGBA1C 7.7 (H) 07/03/2022   HGBA1C 8.9 (H) 01/01/2022     Lipid Panel ( most recent) Lipid Panel     Component Value Date/Time   CHOL 155 10/02/2022 1139   CHOL 142 01/01/2022 1157   TRIG 340 (H) 10/02/2022 1139   HDL 35 (L) 10/02/2022 1139   HDL 45 01/01/2022 1157   CHOLHDL 4.4 10/02/2022 1139   VLDL 68 (H) 10/02/2022 1139   LDLCALC 52 10/02/2022 1139   LDLCALC 75 01/01/2022 1157   LABVLDL 22 01/01/2022 1157      Lab Results  Component Value Date   TSH 1.57 11/19/2022           Assessment & Plan:   1) Type 2 diabetes mellitus with hyperglycemia, with long-term current use of insulin  He presents today with his meter and logs showing improving glycemic profile.  He was not due for another A1c today.  He does have some mild hypoglycemia noted randomly.  He has been taking 120 units of Tresiba due to misunderstanding of directions at last visit.  Analysis of his meter shows 7-day average of 147, 14-day average of 161, 30-day average of 162.  He also went to ED for severe constipation, was found to be impacted, requiring enemas and also had urinary retention (currently has foley catheter).  - Shaun Harris has currently uncontrolled symptomatic type 2 DM since 57 years of age, with most recent A1c of 9 %.   -Recent labs reviewed.  - I had a long discussion with him about the progressive nature of diabetes and the pathology behind its complications. -his diabetes is complicated by CKD stage 3a, ?CVA, tobacco abuse and he remains at a high risk for more acute and chronic complications which include CAD, CVA, CKD, retinopathy,  and neuropathy. These are all discussed in detail with him.  The following Lifestyle Medicine recommendations according to American College of Lifestyle Medicine [facility]) were discussed and offered to patient and he agrees to start the journey:  A. Whole Foods, Plant-based plate comprising of fruits and vegetables, plant-based proteins, whole-grain carbohydrates was discussed in detail with the patient.   A list for source of those nutrients were also provided to the patient.  Patient will use only water or unsweetened tea for hydration. B.  The need to stay away from risky substances including alcohol, smoking; obtaining 7 to 9 hours of restorative sleep, at least 150 minutes of moderate intensity exercise weekly, the importance of healthy social connections,  and stress reduction techniques were discussed. C.  A full color page of  Calorie density of various food groups per pound showing examples of each food groups was provided to the patient.  - Nutritional counseling repeated at each appointment due to patients tendency to fall back in to old habits.  - The patient admits there is a room for improvement in their diet and drink choices. -  Suggestion is made for the patient to avoid simple carbohydrates from their diet including Cakes, Sweet Desserts / Pastries, Ice Cream, Soda (diet and regular), Sweet Tea, Candies, Chips, Cookies, Sweet Pastries, Store Bought Juices, Alcohol in Excess of 1-2 drinks a day, Artificial Sweeteners, Coffee Creamer, and "Sugar-free" Products. This will help patient to have stable blood glucose profile and potentially avoid unintended weight gain.   - I encouraged the patient to switch to unprocessed or minimally processed complex starch and increased protein intake (animal or plant source), fruits, and vegetables.   - Patient is advised to stick to a routine mealtimes to eat 3 meals a day and avoid unnecessary snacks (to snack only to correct hypoglycemia).  - I have  approached him with the following individualized plan to manage his diabetes and patient agrees:   -He is advised to lower his [place] to 80 units SQ nightly (was taking 120 units all at night due to misunderstanding the directions) and continue his Novolog to 14-20 units TID with meals if glucose is above 90 and he is eating (Specific instructions on how to titrate insulin dosage based on glucose readings given to patient in writing).  I advised him to continue his current Ozempic 2 mg SQ weekly for now until he depletes his current supply and then stop given his recent severe constipation and ulcerative colitis, I think this medication may be hurting more than helping at this point.  -he is encouraged to continue monitoring glucose 4 times daily, before meals and before bed, and to call the  clinic if he has readings less than 70 or above 300 for 3 tests in a row.  We did talk about possibility of adding CGM in future, he is not interested at this time.  - he is warned not to take insulin without proper monitoring per orders. - Adjustment parameters are given to him for hypo and hyperglycemia in writing.  - Specific targets for  A1c; LDL, HDL, and Triglycerides were discussed with the patient.  2) Blood Pressure /Hypertension:  his blood pressure is controlled to target, tight today.   he is advised to continue his current medications including Lasix 20 mg p.o. daily with breakfast, Enalapril 20 mg po daily.  3) Lipids/Hyperlipidemia:    Review of his recent lipid panel from 10/03/22 showed controlled LDL at 52 and significantly elevated triglycerides of 340 .  he is advised to continue Lipitor 20 mg daily at bedtime.  Side effects and precautions discussed with him.  We also discussed importance of low fat diet today.  4)  Weight/Diet:  his Body mass index is 36.09 kg/m.  -  clearly complicating his diabetes care.   he is a candidate for weight loss. I discussed with him the fact that loss of 5 -  10% of his  current body weight will have the most impact on his diabetes management.  Exercise, and detailed carbohydrates information provided  -  detailed on discharge instructions.  5) Chronic Care/Health Maintenance: -he is on ACEI/ARB and Statin medications and is encouraged to initiate and continue to follow up with Ophthalmology, Dentist, Podiatrist at least yearly or according to recommendations, and advised to QUIT SMOKING. I have recommended yearly flu vaccine and pneumonia vaccine at least every 5 years; moderate intensity exercise for up to 150 minutes weekly; and sleep for at least 7 hours a day.  - he is advised to maintain close follow up with Tommie Sams, DO for primary care needs, as well as his other providers for optimal and coordinated care.     I spent  35  minutes in the care of the patient today including review of labs from CMP, Lipids, Thyroid Function, Hematology (current and previous including abstractions from other facilities); face-to-face time discussing  his blood glucose readings/logs, discussing hypoglycemia and hyperglycemia episodes and symptoms, medications doses, his options of short and long term treatment based on the latest standards of care / guidelines;  discussion about incorporating lifestyle medicine;  and documenting the encounter. Risk reduction counseling performed per USPSTF guidelines to reduce obesity and cardiovascular risk factors.     Please refer to Patient Instructions for Blood Glucose Monitoring and Insulin/Medications Dosing Guide"  in media tab for additional information. Please  also refer to " Patient Self Inventory" in the Media  tab for reviewed elements of pertinent patient history.  [name] participated in the discussions, expressed understanding, and voiced agreement with the above plans.  All questions were answered to his satisfaction. he is encouraged to contact clinic should he have any questions or concerns prior to his  return visit.     Follow up plan: - Return in about 3 months (around 03/01/2023) for Diabetes F/U with A1c in office, No previsit labs, Bring meter and logs.   Ronny Bacon, [facility] [address] [place], [place] [address] Phone: [phone] Fax: [phone]  11/29/2022, 8:24 AM

## 2022-11-29 NOTE — Patient Instructions (Signed)
Finish currently supply of Ozempic and then stop it altogether.

## 2022-12-16 ENCOUNTER — Other Ambulatory Visit: Payer: Self-pay | Admitting: Family Medicine

## 2022-12-16 DIAGNOSIS — E1165 Type 2 diabetes mellitus with hyperglycemia: Secondary | ICD-10-CM

## 2022-12-20 ENCOUNTER — Other Ambulatory Visit: Payer: Self-pay | Admitting: Family Medicine

## 2022-12-20 DIAGNOSIS — Z794 Long term (current) use of insulin: Secondary | ICD-10-CM

## 2022-12-31 ENCOUNTER — Other Ambulatory Visit: Payer: Self-pay

## 2022-12-31 DIAGNOSIS — N200 Calculus of kidney: Secondary | ICD-10-CM

## 2023-01-01 ENCOUNTER — Ambulatory Visit (HOSPITAL_COMMUNITY)
Admission: RE | Admit: 2023-01-01 | Discharge: 2023-01-01 | Disposition: A | Discharge status: Home / Self Care | Payer: Medicare Other | Source: Ambulatory Visit | Attending: Urology | Admitting: Urology

## 2023-01-01 ENCOUNTER — Ambulatory Visit (INDEPENDENT_AMBULATORY_CARE_PROVIDER_SITE_OTHER): Payer: Medicare Other | Admitting: Urology

## 2023-01-01 VITALS — BP 117/80 | HR 101 | Ht 67.0 in | Wt 230.0 lb

## 2023-01-01 DIAGNOSIS — N200 Calculus of kidney: Secondary | ICD-10-CM | POA: Diagnosis present

## 2023-01-01 DIAGNOSIS — R339 Retention of urine, unspecified: Secondary | ICD-10-CM

## 2023-01-01 MED ORDER — CIPROFLOXACIN HCL 500 MG PO TABS
500.0000 mg | ORAL_TABLET | Freq: Once | ORAL | Status: AC
Start: 2023-01-01 — End: 2023-01-01
  Administered 2023-01-01: 500 mg via ORAL

## 2023-01-01 NOTE — Progress Notes (Signed)
Fill and Pull Catheter Removal  Patient is present today for a catheter removal.  Patient was cleaned and prepped in a sterile fashion 360 ml of sterile water/ saline was instilled into the bladder when the patient felt the urge to urinate. 10 ml of water was then drained from the balloon.  A 16 FR foley cath was removed from the bladder no complications were noted .  Patient as then given some time to void on their own.  Patient can void  210 ml on their own after some time.  Patient tolerated well.  Performed by: Kennyth Lose, CMA  Follow up/ Additional notes: Keep scheduled appt

## 2023-01-01 NOTE — Progress Notes (Signed)
01/01/2023 11:17 AM   [name] 05/19/1966 [id number]  Referring provider: Tommie Sams, DO [address] [place],  [place] [address]  Urinary retention and nephrolithiasis   HPI: Mr Noone is a 57yo here for followup for nephrolithiasis and new urinary retention. He had a foley placed over 1 month ago for urinary retention. He is currently on flomax. CT on 11/15/2022 showed multiple 1-59mm renal calculi. No other complaints today   PMH: Past Medical History:  Diagnosis Date   Anxiety    Arthritis    Cerebral vasculitis    Chronic headaches    constant since possible stroke in 1997,"Vasculitis of brain with increased pressure of brain ".   COPD (chronic obstructive pulmonary disease) (HCC)    COPD (chronic obstructive pulmonary disease) (HCC)    Diabetes mellitus    Hyperlipidemia    Hypertension    Hypertriglyceridemia    Sleep apnea    has but doesnt use, cannot tolerate; PCP aware   Stroke [facility])    pt states,"they arent sure if i had a stroke in 1997, there is still a question about that with my doctors". No deficits.   Tobacco abuse    Ulcerative colitis (HCC)    Ulcerative colitis (HCC)    Venous stasis     Surgical History: Past Surgical History:  Procedure Laterality Date   BIOPSY  10/25/2016   Procedure: BIOPSY;  Surgeon: Malissa Hippo, MD;  [facility];  Service: Endoscopy;;  colon   CERVICAL SPINE SURGERY  2008   x2-last surgery 04/24/2016.   COLONOSCOPY WITH PROPOFOL N/A 10/25/2016   Procedure: COLONOSCOPY WITH PROPOFOL;  Surgeon: Malissa Hippo, MD;  Location: [facility];  Service: Endoscopy;  Laterality: N/A;  7:30   EXTRACORPOREAL SHOCK WAVE LITHOTRIPSY Left 08/08/2020   Procedure: EXTRACORPOREAL SHOCK WAVE LITHOTRIPSY (ESWL);  Surgeon: Malen Gauze, MD;  Location: [facility];  Service: Urology;  Laterality: Left;   KNEE ARTHROSCOPY  02/25/2012   Procedure: ARTHROSCOPY KNEE;  Surgeon: Darreld Mclean, MD;  Location: [facility];   Service: Orthopedics;  Laterality: Right;   KNEE ARTHROSCOPY WITH MEDIAL MENISECTOMY Left 12/23/2014   Procedure: LEFT KNEE ARTHROSCOPY WITH PARTIAL MENISECTOMY;  Surgeon: Darreld Mclean, MD;  Location: [facility];  Service: Orthopedics;  Laterality: Left;   POLYPECTOMY  10/25/2016   Procedure: POLYPECTOMY;  Surgeon: Malissa Hippo, MD;  [facility];  Service: Endoscopy;;  colon   SHOULDER ARTHROSCOPY  2004   left shoulder   SHOULDER ARTHROSCOPY  2008   right shoulder    Home Medications:  Allergies as of 01/01/2023       Reactions   Gabapentin Other (See Comments)   Increased blood sugar        Medication List        Accurate as of January 01, 2023 11:17 AM. If you have any questions, ask your nurse or doctor.          acetaminophen 500 MG tablet Commonly known as: TYLENOL Take 1,000 mg by mouth every 6 (six) hours as needed for moderate pain.   Aimovig 70 MG/ML Soaj Generic drug: Erenumab-aooe Inject 70 mg into the skin every 30 (thirty) days.   albuterol 108 (90 Base) MCG/ACT inhaler Commonly known as: Ventolin HFA inhale TWO puffs by MOUTH every SIX hours IN THE LEFT EAR needed FOR shortness of breath   ALPRAZolam 0.25 MG tablet Commonly known as: XANAX Take 0.5 mg by mouth 3 (three)  times daily.   ascorbic acid 500 MG tablet Commonly known as: VITAMIN C Take 500 mg by mouth daily.   aspirin EC 325 MG tablet Take 325 mg by mouth at bedtime.   atorvastatin 20 MG tablet Commonly known as: LIPITOR TAKE ONE TABLET BY MOUTH EVERY DAY   Blood Glucose Monitoring Suppl w/Device Kit Test glucose 4 times daily. E11.9   buPROPion 150 MG 12 hr tablet Commonly known as: WELLBUTRIN SR TAKE ONE TABLET BY MOUTH TWICE DAILY   CENTRUM PO Take 1 tablet by mouth daily.   cyclobenzaprine 5 MG tablet Commonly known as: FLEXERIL Take 1 tablet (5 mg total) by mouth 3 (three) times daily as needed for muscle spasms.   enalapril 20 MG tablet Commonly known as:  VASOTEC TAKE ONE TABLET BY MOUTH TWICE DAILY   fluticasone-salmeterol 250-50 MCG/ACT Aepb Commonly known as: ADVAIR inhale ONE PUFF by MOUTH every 12 hours. <<rinse MOUTH AFTER use>>   furosemide 20 MG tablet Commonly known as: LASIX TAKE ONE TABLET BY MOUTH EVERY DAY   hydrOXYzine 50 MG tablet Commonly known as: ATARAX Take 50 mg by mouth daily as needed for anxiety.   insulin aspart 100 UNIT/ML injection Commonly known as: NovoLOG Inject 14-20 Units into the skin 3 (three) times daily with meals.   levETIRAcetam 500 MG tablet Commonly known as: KEPPRA TAKE 3 TABLETS BY MOUTH TWICE DAILY.   Mesalamine 400 MG Cpdr DR capsule Commonly known as: ASACOL TAKE ONE CAPSULE BY MOUTH FOUR TIMES DAILY   morphine 30 MG 12 hr tablet Commonly known as: MS CONTIN Take 1 tablet (30 mg total) by mouth 2 (two) times daily. May fill 60 days after 09/30/2017   ondansetron 4 MG tablet Commonly known as: Zofran Take 1 tablet (4 mg total) by mouth every 8 (eight) hours as needed for nausea or vomiting.   Oxycodone HCl 10 MG Tabs Take 1 tablet by mouth 4 times a day as needed for pain. Do not drive or operate machinery while on this medicine. What changed:  how much to take how to take this when to take this reasons to take this additional instructions   polyethylene glycol 17 g packet Commonly known as: MiraLax Take 17 g by mouth daily.   Potassium Citrate 15 MEQ (1620 MG) Tbcr Take 1 tablet by mouth 2 (two) times daily.   promethazine 25 MG tablet Commonly known as: PHENERGAN Take 0.5 tablets (12.5 mg total) by mouth every 6 (six) hours as needed for nausea or vomiting. What changed: how much to take   QUEtiapine 100 MG tablet Commonly known as: SEROQUEL Take 100 mg by mouth at bedtime.   sodium bicarbonate 650 MG tablet Take 1 tablet (650 mg total) by mouth 2 (two) times daily.   tamsulosin 0.4 MG Caps capsule Commonly known as: FLOMAX Take 1 capsule (0.4 mg total) by  mouth daily after supper.   traZODone 100 MG tablet Commonly known as: DESYREL Take 100 mg by mouth at bedtime.   Evaristo Bury FlexTouch 200 UNIT/ML FlexTouch Pen Generic drug: insulin degludec Inject 80 Units into the skin at bedtime.   True Metrix Blood Glucose Test test strip Generic drug: glucose blood check blood sugar FOUR TIMES DAILY   TRUEplus Insulin Syringe 31G X 5/16" 1 ML Misc Generic drug: Insulin Syringe-Needle U-100 AS DIRECTED   venlafaxine XR 37.5 MG 24 hr capsule Commonly known as: EFFEXOR-XR Take 1 capsule (37.5 mg total) by mouth daily.  Allergies:  Allergies  Allergen Reactions   Gabapentin Other (See Comments)    Increased blood sugar    Family History: Family History  Problem Relation Age of Onset   Hypertension Father    Diabetes Father    Heart disease Father    Parkinson's disease Father    Hypertension Sister    Heart disease Brother        both brothers have heart disease   Heart disease Maternal Grandfather    Lupus Daughter    Neurofibromatosis Son    Parkinson's disease Paternal Aunt    Parkinson's disease Paternal Uncle    Heart attack Other     Social History:  reports that he has been smoking cigarettes. He has a 35.00 pack-year smoking history. He has never used smokeless tobacco. He reports that he does not drink alcohol and does not use drugs.  ROS: All other review of systems were reviewed and are negative except what is noted above in HPI  Physical Exam: BP 117/80   Pulse (!) 101   Ht 5\' 7"  (1.702 m)   Wt 230 lb (104.3 kg)   BMI 36.02 kg/m   Constitutional:  Alert and oriented, No acute distress. HEENT: [place] AT, moist mucus membranes.  Trachea midline, no masses. Cardiovascular: No clubbing, cyanosis, or edema. Respiratory: Normal respiratory effort, no increased work of breathing. GI: Abdomen is soft, nontender, nondistended, no abdominal masses GU: No CVA tenderness.  Lymph: No cervical or inguinal  lymphadenopathy. Skin: No rashes, bruises or suspicious lesions. Neurologic: Grossly intact, no focal deficits, moving all 4 extremities. Psychiatric: Normal mood and affect.  Laboratory Data: Lab Results  Component Value Date   WBC 9.6 11/15/2022   HGB 14.1 11/15/2022   HCT 42.5 11/15/2022   MCV 93.0 11/15/2022   PLT 259 11/15/2022    Lab Results  Component Value Date   CREATININE 1.60 (H) 11/15/2022    No results found for: "PSA"  No results found for: "TESTOSTERONE"  Lab Results  Component Value Date   HGBA1C 9.0 (H) 10/02/2022    Urinalysis    Component Value Date/Time   COLORURINE YELLOW 11/15/2022 0802   APPEARANCEUR CLEAR 11/15/2022 0802   APPEARANCEUR Clear 11/06/2022 1325   LABSPEC 1.019 11/15/2022 0802   PHURINE 6.0 11/15/2022 0802   GLUCOSEU NEGATIVE 11/15/2022 0802   HGBUR NEGATIVE 11/15/2022 0802   BILIRUBINUR NEGATIVE 11/15/2022 0802   BILIRUBINUR Negative 11/06/2022 1325   KETONESUR NEGATIVE 11/15/2022 0802   PROTEINUR NEGATIVE 11/15/2022 0802   UROBILINOGEN 0.2 12/21/2014 0845   NITRITE NEGATIVE 11/15/2022 0802   LEUKOCYTESUR NEGATIVE 11/15/2022 0802    Lab Results  Component Value Date   LABMICR Comment 11/06/2022   WBCUA None seen 07/25/2020   LABEPIT None seen 07/25/2020   MUCUS Present 07/25/2020   BACTERIA Few 07/25/2020    Pertinent Imaging: Ct 11/15/2022: Images reviewed and discussed with the patient  Results for orders placed during the hospital encounter of 09/20/22  Abdomen 1 view (KUB)  Narrative CLINICAL DATA:  Nephrolithiasis  EXAM: ABDOMEN - 1 VIEW  COMPARISON:  12/26/2021  FINDINGS: The bowel gas pattern is normal. 1.5 cm calcification overlies the right kidney. Punctate calcification overlies the left kidney. These findings can be followed with CT.  IMPRESSION: Bilateral nephrolithiasis.   Electronically Signed By: Layla Maw M.D. On: 09/22/2022 13:24  No results found for this or any previous  visit.  No results found for this or any previous visit.  No results found for  this or any previous visit.  Results for orders placed during the hospital encounter of 12/18/20  Ultrasound renal complete  Narrative CLINICAL DATA:  Nephrolithiasis.  EXAM: RENAL / URINARY TRACT ULTRASOUND COMPLETE  COMPARISON:  September 14, 2020.  FINDINGS: Right Kidney:  Renal measurements: 11.9 x 6.0 x 6.3 cm = volume: 239 mL. Probable 12 mm nonobstructive calculus seen in midpole collecting system. Echogenicity within normal limits. No mass or hydronephrosis visualized.  Left Kidney:  Renal measurements: 13.4 x 5.7 x 5.6 cm = volume: 224 mL. Probable nonobstructive 7 mm calculus seen in midpole collecting system. Echogenicity within normal limits. No mass or hydronephrosis visualized.  Bladder:  Appears normal for degree of bladder distention. Bilateral ureteral jets are noted.  Other:  None.  IMPRESSION: Probable bilateral nonobstructive nephrolithiasis. No hydronephrosis or renal obstruction is noted.   Electronically Signed By: Lupita Raider M.D. On: 12/18/2020 13:39  No valid procedures specified. No results found for this or any previous visit.  Results for orders placed in visit on 09/11/22  CT RENAL STONE STUDY  Narrative CLINICAL DATA:  Bilateral flank pain, history of calculi  EXAM: CT ABDOMEN AND PELVIS WITHOUT CONTRAST  TECHNIQUE: Multidetector CT imaging of the abdomen and pelvis was performed following the standard protocol without IV contrast.  RADIATION DOSE REDUCTION: This exam was performed according to the departmental dose-optimization program which includes automated exposure control, adjustment of the mA and/or kV according to patient size and/or use of iterative reconstruction technique.  COMPARISON:  09/14/2020 and radiographs from 12/26/2021  FINDINGS: Lower chest: Unremarkable  Hepatobiliary: Unremarkable  Pancreas:  Unremarkable  Spleen: Unremarkable  Adrenals/Urinary Tract: Stable 3.7 by 4.1 cm right adrenal myelolipoma, image 19 series 2. No signs of interval hematoma. This lesion is benign and does not require further imaging workup.  Cluster calculi in the right kidney lower pole measuring up to 1.1 cm in long axis. There is also a 2 mm right kidney upper pole nonobstructive renal calculus.  Two 2 mm left renal calculi are present.  No hydronephrosis or hydroureter.  No ureteral or bladder calculus.  Stomach/Bowel: Unremarkable.  Normal appendix.  Vascular/Lymphatic: Atherosclerosis is present, including aortoiliac atherosclerotic disease.  Reproductive: Borderline prostatomegaly.  Other: No supplemental non-categorized findings.  Musculoskeletal: Mild lumbar spondylosis and degenerative disc disease.  IMPRESSION: 1. Bilateral nonobstructive nephrolithiasis. 2. Stable 4.1 cm right adrenal myelolipoma. This lesion is benign and does not require further imaging workup. 3. Borderline prostatomegaly. 4. Mild lumbar spondylosis and degenerative disc disease. 5. Aortic atherosclerosis.  Aortic Atherosclerosis (ICD10-I70.0).   Electronically Signed By: Gaylyn Rong M.D. On: 09/11/2022 15:04   Assessment & Plan:    1. Nephrolithiasis -We discussed the management of kidney stones. These options include observation, ureteroscopy, shockwave lithotripsy (ESWL) and percutaneous nephrolithotomy (PCNL). We discussed which options are relevant to the patient's stone(s). We discussed the natural history of kidney stones as well as the complications of untreated stones and the impact on quality of life without treatment as well as with each of the above listed treatments. We also discussed the efficacy of each treatment in its ability to clear the stone burden. With any of these management options I discussed the signs and symptoms of infection and the need for emergent treatment should  these be experienced. For each option we discussed the ability of each procedure to clear the patient of their stone burden.   For observation I described the risks which include but are not limited to silent renal damage,  life-threatening infection, need for emergent surgery, failure to pass stone and pain.   For ureteroscopy I described the risks which include bleeding, infection, damage to contiguous structures, positioning injury, ureteral stricture, ureteral avulsion, ureteral injury, need for prolonged ureteral stent, inability to perform ureteroscopy, need for an interval procedure, inability to clear stone burden, stent discomfort/pain, heart attack, stroke, pulmonary embolus and the inherent risks with general anesthesia.   For shockwave lithotripsy I described the risks which include arrhythmia, kidney contusion, kidney hemorrhage, need for transfusion, pain, inability to adequately break up stone, inability to pass stone fragments, Steinstrasse, infection associated with obstructing stones, need for alternate surgical procedure, need for repeat shockwave lithotripsy, MI, CVA, PE and the inherent risks with anesthesia/conscious sedation.   For PCNL I described the risks including positioning injury, pneumothorax, hydrothorax, need for chest tube, inability to clear stone burden, renal laceration, arterial venous fistula or malformation, need for embolization of kidney, loss of kidney or renal function, need for repeat procedure, need for prolonged nephrostomy tube, ureteral avulsion, MI, CVA, PE and the inherent risks of general anesthesia.   - The patient would like to proceed with  - Bladder Voiding Trial - ciprofloxacin (CIPRO) tablet 500 mg  2. Urinary retention -voiding trial today -continue flomax 0.4mg     No follow-ups on file.  Wilkie Aye, MD  [facility] [place]

## 2023-01-02 ENCOUNTER — Ambulatory Visit (INDEPENDENT_AMBULATORY_CARE_PROVIDER_SITE_OTHER): Payer: Medicare Other

## 2023-01-02 ENCOUNTER — Ambulatory Visit (INDEPENDENT_AMBULATORY_CARE_PROVIDER_SITE_OTHER): Payer: Medicare Other | Admitting: Family Medicine

## 2023-01-02 VITALS — BP 90/52 | HR 110 | Temp 97.6°F | Ht 67.0 in | Wt 233.0 lb

## 2023-01-02 DIAGNOSIS — N1831 Chronic kidney disease, stage 3a: Secondary | ICD-10-CM

## 2023-01-02 DIAGNOSIS — I10 Essential (primary) hypertension: Secondary | ICD-10-CM | POA: Diagnosis not present

## 2023-01-02 DIAGNOSIS — J449 Chronic obstructive pulmonary disease, unspecified: Secondary | ICD-10-CM

## 2023-01-02 DIAGNOSIS — R339 Retention of urine, unspecified: Secondary | ICD-10-CM

## 2023-01-02 DIAGNOSIS — R110 Nausea: Secondary | ICD-10-CM | POA: Diagnosis not present

## 2023-01-02 LAB — BLADDER SCAN AMB NON-IMAGING: Scan Result: 399

## 2023-01-02 MED ORDER — ENALAPRIL MALEATE 20 MG PO TABS: 20.0000 mg | ORAL_TABLET | Freq: Every day | ORAL | 1 refills | Status: DC

## 2023-01-02 MED ORDER — ONDANSETRON 8 MG PO TBDP: 8.0000 mg | ORAL_TABLET | Freq: Three times a day (TID) | ORAL | 6 refills | Status: DC | PRN

## 2023-01-02 NOTE — Patient Instructions (Signed)
Zofran as needed.  Decrease Enalapril to once daily.  Lab today.  Follow up in 3 months.  Call with concerns.

## 2023-01-02 NOTE — Progress Notes (Addendum)
Simple Catheter Placement  Due to urinary retention patient is present today for a foley cath placement.  Patient was cleaned and prepped in a sterile fashion with betadine and 2% lidocaine jelly was instilled into the urethra. A 16 FR foley catheter was inserted, urine return was noted  , urine was yellow in color.  The balloon was filled with 10cc of sterile water.  A overnight bag was attached for drainage. Patient was also given a night bag to take home and was given instruction on how to change from one bag to another.  Patient was given instruction on proper catheter care.  Patient tolerated well, no complications were noted   Performed by: Kennyth Lose, CMA  Additional notes/ Follow up: No follow-ups on file.

## 2023-01-03 DIAGNOSIS — R110 Nausea: Secondary | ICD-10-CM | POA: Insufficient documentation

## 2023-01-03 LAB — BASIC METABOLIC PANEL
BUN/Creatinine Ratio: 16 (ref 9–20)
BUN: 31 mg/dL — ABNORMAL HIGH (ref 6–24)
CO2: 26 mmol/L (ref 20–29)
Calcium: 9.6 mg/dL (ref 8.7–10.2)
Chloride: 102 mmol/L (ref 96–106)
Creatinine, Ser: 1.88 mg/dL — ABNORMAL HIGH (ref 0.76–1.27)
Glucose: 101 mg/dL — ABNORMAL HIGH (ref 70–99)
Potassium: 5.8 mmol/L — ABNORMAL HIGH (ref 3.5–5.2)
Sodium: 142 mmol/L (ref 134–144)
eGFR: 41 mL/min/{1.73_m2} — ABNORMAL LOW (ref >59)

## 2023-01-03 NOTE — Assessment & Plan Note (Signed)
Stable

## 2023-01-03 NOTE — Assessment & Plan Note (Signed)
At the end of the visit, patient requested a prescription for Zofran.  He is having issues with nausea associated with urinary retention.  Medication sent.

## 2023-01-03 NOTE — Progress Notes (Signed)
Subjective:  Patient ID: Shaun Harris, male    DOB: September 15, 1965  Age: 57 y.o. MRN: [id number]  CC: Chief Complaint  Patient presents with   3 month follow up    HPI:  57 year old male with an extensive past medical history presents for follow-up  Having a difficult time recently.  Suffered external hemorrhoids which prevented colonoscopy.  Recently developed urinary retention and has a catheter in place.  Being followed by urology.  Failed recent voiding trial and catheter had to be replaced.  BP low here today.  He is on Lasix and enalapril.  Need to back off on medication.  COPD stable.  Continues to smoke.  Diabetes continues to be uncontrolled.  Follows closely with endocrinology.  No reports of hypoglycemia.  Remains on long-acting and short acting insulin.  Patient Active Problem List   Diagnosis Date Noted   Nausea 01/03/2023   Thrombosed hemorrhoids 10/07/2022   Tremor 10/02/2022   Migraine 04/04/2022   BPH (benign prostatic hyperplasia) 01/01/2022   Ulcerative colitis (HCC) 07/02/2021   Type 2 diabetes mellitus with diabetic chronic kidney disease (HCC) 07/02/2021   History of seizure 03/14/2021   Stage 3 chronic kidney disease (HCC) 02/06/2021   Nephrolithiasis 07/25/2020   COPD (chronic obstructive pulmonary disease) (HCC) 03/10/2013   Tobacco abuse 03/10/2013   Essential hypertension 10/01/2012   Hyperlipidemia 10/01/2012   Chronic pain syndrome 10/01/2012    Social Hx   Social History   Socioeconomic History   Marital status: Widowed    Spouse name: Not on file   Number of children: Not on file   Years of education: Not on file   Highest education level: Not on file  Occupational History   Not on file  Tobacco Use   Smoking status: Every Day    Packs/day: 1.00    Years: 35.00    Additional pack years: 0.00    Total pack years: 35.00    Types: Cigarettes   Smokeless tobacco: Never  Vaping Use   Vaping Use: Never used  Substance and Sexual  Activity   Alcohol use: No   Drug use: No   Sexual activity: Yes    Birth control/protection: None  Other Topics Concern   Not on file  Social History Narrative   Disabled   Right handed   Social Determinants of Health   Financial Resource Strain: Not on file  Food Insecurity: Not on file  Transportation Needs: Not on file  Physical Activity: Not on file  Stress: Not on file  Social Connections: Not on file    Review of Systems Per HPI  Objective:  BP (!) 90/52   Pulse (!) 110   Temp 97.6 F (36.4 C)   Ht 5\' 7"  (1.702 m)   Wt 233 lb (105.7 kg)   SpO2 95%   BMI 36.49 kg/m      01/02/2023   10:05 AM 01/01/2023   10:23 AM 11/29/2022    8:02 AM  BP/Weight  Systolic BP 90 117 94  Diastolic BP 52 80 59  Wt. (Lbs) 233 230 230.4  BMI 36.49 kg/m2 36.02 kg/m2 36.09 kg/m2    Physical Exam Vitals and nursing note reviewed.  Constitutional:      General: He is not in acute distress.    Appearance: He is obese.  Cardiovascular:     Rate and Rhythm: Normal rate and regular rhythm.  Pulmonary:     Effort: Pulmonary effort is normal.  Breath sounds: Normal breath sounds. No wheezing, rhonchi or rales.  Neurological:     Mental Status: He is alert.  Psychiatric:        Mood and Affect: Mood normal.        Behavior: Behavior normal.     Lab Results  Component Value Date   WBC 9.6 11/15/2022   HGB 14.1 11/15/2022   HCT 42.5 11/15/2022   PLT 259 11/15/2022   GLUCOSE 101 (H) 01/02/2023   CHOL 155 10/02/2022   TRIG 340 (H) 10/02/2022   HDL 35 (L) 10/02/2022   LDLCALC 52 10/02/2022   ALT 21 11/15/2022   AST 20 11/15/2022   NA 142 01/02/2023   K 5.8 (H) 01/02/2023   CL 102 01/02/2023   CREATININE 1.88 (H) 01/02/2023   BUN 31 (H) 01/02/2023   CO2 26 01/02/2023   TSH 1.57 11/19/2022   HGBA1C 9.0 (H) 10/02/2022     Assessment & Plan:   Problem List Items Addressed This Visit       Cardiovascular and Mediastinum   Essential hypertension - Primary     Low BP today. Decreasing Enalapril. Check BMP today.       Relevant Medications   enalapril (VASOTEC) 20 MG tablet     Respiratory   COPD (chronic obstructive pulmonary disease) (HCC)    Stable.        Genitourinary   Stage 3 chronic kidney disease (HCC)   Relevant Orders   Basic metabolic panel (Completed)     Other   Nausea    At the end of the visit, patient requested a prescription for Zofran.  He is having issues with nausea associated with urinary retention.  Medication sent.       Meds ordered this encounter  Medications   enalapril (VASOTEC) 20 MG tablet    Sig: Take 1 tablet (20 mg total) by mouth daily.    Dispense:  180 tablet    Refill:  1    This prescription was filled on 07/22/2022. Any refills authorized will be placed on file.   ondansetron (ZOFRAN-ODT) 8 MG disintegrating tablet    Sig: Take 1 tablet (8 mg total) by mouth every 8 (eight) hours as needed for nausea or vomiting.    Dispense:  20 tablet    Refill:  6    Follow-up:  Return in about 3 months (around 04/04/2023).  Everlene Other DO [facility]

## 2023-01-03 NOTE — Assessment & Plan Note (Signed)
Low BP today. Decreasing Enalapril. Check BMP today.

## 2023-01-06 ENCOUNTER — Other Ambulatory Visit: Payer: Self-pay

## 2023-01-06 DIAGNOSIS — N1831 Chronic kidney disease, stage 3a: Secondary | ICD-10-CM

## 2023-01-07 ENCOUNTER — Encounter: Payer: Self-pay | Admitting: Urology

## 2023-01-07 NOTE — Patient Instructions (Signed)

## 2023-01-09 ENCOUNTER — Other Ambulatory Visit: Payer: Self-pay | Admitting: Family Medicine

## 2023-01-14 ENCOUNTER — Telehealth: Payer: Self-pay

## 2023-01-14 LAB — BASIC METABOLIC PANEL
BUN/Creatinine Ratio: 20 (ref 9–20)
BUN: 57 mg/dL — ABNORMAL HIGH (ref 6–24)
CO2: 24 mmol/L (ref 20–29)
Calcium: 9.4 mg/dL (ref 8.7–10.2)
Chloride: 102 mmol/L (ref 96–106)
Creatinine, Ser: 2.89 mg/dL — ABNORMAL HIGH (ref 0.76–1.27)
Glucose: 79 mg/dL (ref 70–99)
Potassium: 5.7 mmol/L — ABNORMAL HIGH (ref 3.5–5.2)
Sodium: 141 mmol/L (ref 134–144)
eGFR: 25 mL/min/{1.73_m2} — ABNORMAL LOW (ref >59)

## 2023-01-14 NOTE — Telephone Encounter (Signed)
Patient's nephrologist has left the group- please advise

## 2023-01-14 NOTE — Telephone Encounter (Signed)
Pt called said Dr Adriana Simas wanted to send him to Dr Terrill Mohr office and he is not longer with the practice and Izic wants someone to call him regarding this.   Shaun Harris [phone]

## 2023-01-15 NOTE — Telephone Encounter (Signed)
Bagsby, Magalia G, DO     Hold enalapril. Wolfgang Phoenix is still in practice just on his own. If he wants to see another nephrologist place urgent referral.

## 2023-01-15 NOTE — Telephone Encounter (Signed)
Patient notified and stated he will stick with his current doctor and will call him Friday for appt and let us know if they need a new referral

## 2023-01-18 ENCOUNTER — Other Ambulatory Visit: Payer: Self-pay | Admitting: Family Medicine

## 2023-01-18 ENCOUNTER — Other Ambulatory Visit: Payer: Self-pay | Admitting: Urology

## 2023-01-18 DIAGNOSIS — E1165 Type 2 diabetes mellitus with hyperglycemia: Secondary | ICD-10-CM

## 2023-01-22 ENCOUNTER — Ambulatory Visit (INDEPENDENT_AMBULATORY_CARE_PROVIDER_SITE_OTHER): Payer: Medicare Other | Admitting: Urology

## 2023-01-22 VITALS — BP 93/59 | HR 105

## 2023-01-22 DIAGNOSIS — N200 Calculus of kidney: Secondary | ICD-10-CM | POA: Diagnosis not present

## 2023-01-22 DIAGNOSIS — R339 Retention of urine, unspecified: Secondary | ICD-10-CM

## 2023-01-22 DIAGNOSIS — N401 Enlarged prostate with lower urinary tract symptoms: Secondary | ICD-10-CM | POA: Diagnosis not present

## 2023-01-22 MED ORDER — TAMSULOSIN HCL 0.4 MG PO CAPS: 0.4000 mg | ORAL_CAPSULE | Freq: Two times a day (BID) | ORAL | 3 refills | Status: DC

## 2023-01-22 MED ORDER — CIPROFLOXACIN HCL 500 MG PO TABS
500.0000 mg | ORAL_TABLET | Freq: Once | ORAL | Status: AC
Start: 2023-01-22 — End: 2023-01-22
  Administered 2023-01-22: 500 mg via ORAL

## 2023-01-22 NOTE — Progress Notes (Signed)
01/22/2023 9:35 AM   Shaun Harris 13-Jul-1966 [id number]  Referring provider: Tommie Sams, DO [address] Shaun Harris [place],  [place] [address]  Urinary retention   HPI: [name] is a 57yo here for followup for BPh with urinary retention. He has an indwelling foley currently. He is currently on flomax 0.4mg  daily.    PMH: Past Medical History:  Diagnosis Date   Anxiety    Arthritis    Cerebral vasculitis    Chronic headaches    constant since possible stroke in 1997,"Vasculitis of brain with increased pressure of brain ".   COPD (chronic obstructive pulmonary disease) (HCC)    COPD (chronic obstructive pulmonary disease) (HCC)    Diabetes mellitus    Hyperlipidemia    Hypertension    Hypertriglyceridemia    Sleep apnea    has but doesnt use, cannot tolerate; PCP aware   Stroke [facility])    pt states,"they arent sure if i had a stroke in 1997, there is still a question about that with my doctors". No deficits.   Tobacco abuse    Ulcerative colitis (HCC)    Ulcerative colitis (HCC)    Venous stasis     Surgical History: Past Surgical History:  Procedure Laterality Date   BIOPSY  10/25/2016   Procedure: BIOPSY;  Surgeon: Shaun Hippo, MD;  [facility]: [facility];  Service: Endoscopy;;  colon   CERVICAL SPINE SURGERY  2008   x2-last surgery 04/24/2016.   COLONOSCOPY WITH PROPOFOL N/A 10/25/2016   Procedure: COLONOSCOPY WITH PROPOFOL;  Surgeon: Shaun Hippo, MD;  Location: [facility];  Service: Endoscopy;  Laterality: N/A;  7:30   EXTRACORPOREAL SHOCK WAVE LITHOTRIPSY Left 08/08/2020   Procedure: EXTRACORPOREAL SHOCK WAVE LITHOTRIPSY (ESWL);  Surgeon: Shaun Gauze, MD;  Location: [facility];  Service: Urology;  Laterality: Left;   KNEE ARTHROSCOPY  02/25/2012   Procedure: ARTHROSCOPY KNEE;  Surgeon: Shaun Mclean, MD;  Location: [facility];  Service: Orthopedics;  Laterality: Right;   KNEE ARTHROSCOPY WITH MEDIAL MENISECTOMY Left 12/23/2014   Procedure: LEFT KNEE  ARTHROSCOPY WITH PARTIAL MENISECTOMY;  Surgeon: Shaun Mclean, MD;  Location: [facility];  Service: Orthopedics;  Laterality: Left;   POLYPECTOMY  10/25/2016   Procedure: POLYPECTOMY;  Surgeon: Shaun Hippo, MD;  [facility];  Service: Endoscopy;;  colon   SHOULDER ARTHROSCOPY  2004   left shoulder   SHOULDER ARTHROSCOPY  2008   right shoulder    Home Medications:  Allergies as of 01/22/2023       Reactions   Gabapentin Other (See Comments)   Increased blood sugar        Medication List        Accurate as of January 22, 2023  9:35 AM. If you have any questions, ask your nurse or doctor.          acetaminophen 500 MG tablet Commonly known as: TYLENOL Take 1,000 mg by mouth every 6 (six) hours as needed for moderate pain.   Aimovig 70 MG/ML Soaj Generic drug: Erenumab-aooe Inject 70 mg into the skin every 30 (thirty) days.   albuterol 108 (90 Base) MCG/ACT inhaler Commonly known as: Ventolin HFA inhale TWO puffs by MOUTH every SIX hours IN THE LEFT EAR needed FOR shortness of breath   ALPRAZolam 0.25 MG tablet Commonly known as: XANAX Take 0.5 mg by mouth 3 (three) times daily.   ascorbic acid 500 MG tablet Commonly known as: VITAMIN C Take 500 mg  by mouth daily.   aspirin EC 325 MG tablet Take 325 mg by mouth at bedtime.   atorvastatin 20 MG tablet Commonly known as: LIPITOR TAKE ONE TABLET BY MOUTH EVERY DAY   Blood Glucose Monitoring Suppl w/Device Kit Test glucose 4 times daily. E11.9   buPROPion 150 MG 12 hr tablet Commonly known as: WELLBUTRIN SR TAKE ONE TABLET BY MOUTH TWICE DAILY   CENTRUM PO Take 1 tablet by mouth daily.   cyclobenzaprine 5 MG tablet Commonly known as: FLEXERIL Take 1 tablet (5 mg total) by mouth 3 (three) times daily as needed for muscle spasms.   enalapril 20 MG tablet Commonly known as: VASOTEC Take 1 tablet (20 mg total) by mouth daily.   fluticasone-salmeterol 250-50 MCG/ACT Aepb Commonly known as:  ADVAIR inhale ONE PUFF by MOUTH every 12 hours. <<rinse MOUTH AFTER use>>   furosemide 20 MG tablet Commonly known as: LASIX TAKE ONE TABLET BY MOUTH EVERY DAY   hydrOXYzine 50 MG tablet Commonly known as: ATARAX Take 50 mg by mouth daily as needed for anxiety.   insulin aspart 100 UNIT/ML injection Commonly known as: NovoLOG Inject 14-20 Units into the skin 3 (three) times daily with meals.   levETIRAcetam 500 MG tablet Commonly known as: KEPPRA TAKE 3 TABLETS BY MOUTH TWICE DAILY.   Mesalamine 400 MG Cpdr DR capsule Commonly known as: ASACOL TAKE ONE CAPSULE BY MOUTH FOUR TIMES DAILY   morphine 30 MG 12 hr tablet Commonly known as: MS CONTIN Take 1 tablet (30 mg total) by mouth 2 (two) times daily. May fill 60 days after 09/30/2017   ondansetron 8 MG disintegrating tablet Commonly known as: ZOFRAN-ODT Take 1 tablet (8 mg total) by mouth every 8 (eight) hours as needed for nausea or vomiting.   Oxycodone HCl 10 MG Tabs Take 1 tablet by mouth 4 times a day as needed for pain. Do not drive or operate machinery while on this medicine. What changed:  how much to take how to take this when to take this reasons to take this additional instructions   polyethylene glycol 17 g packet Commonly known as: MiraLax Take 17 g by mouth daily.   Potassium Citrate 15 MEQ (1620 MG) Tbcr Take 1 tablet by mouth 2 (two) times daily.   QUEtiapine 100 MG tablet Commonly known as: SEROQUEL Take 100 mg by mouth at bedtime.   sodium bicarbonate 650 MG tablet TAKE 1 TABLET BY MOUTH TWICE DAILY   tamsulosin 0.4 MG Caps capsule Commonly known as: FLOMAX Take 1 capsule (0.4 mg total) by mouth 2 (two) times daily. What changed: when to take this   traZODone 100 MG tablet Commonly known as: DESYREL Take 100 mg by mouth at bedtime.   Evaristo Bury FlexTouch 200 UNIT/ML FlexTouch Pen Generic drug: insulin degludec Inject 80 Units into the skin at bedtime.   True Metrix Blood Glucose Test  test strip Generic drug: glucose blood check blood sugar FOUR TIMES DAILY   TRUEplus Insulin Syringe 31G X 5/16" 1 ML Misc Generic drug: Insulin Syringe-Needle U-100 AS DIRECTED   BD Veo Insulin Syringe U/F 31G X 15/64" 1 ML Misc Generic drug: Insulin Syringe-Needle U-100 AS DIRECTED   venlafaxine XR 37.5 MG 24 hr capsule Commonly known as: EFFEXOR-XR Take 1 capsule (37.5 mg total) by mouth daily.        Allergies:  Allergies  Allergen Reactions   Gabapentin Other (See Comments)    Increased blood sugar    Family History: Family History  Problem Relation Age of Onset   Hypertension Father    Diabetes Father    Heart disease Father    Parkinson's disease Father    Hypertension Sister    Heart disease Brother        both brothers have heart disease   Heart disease Maternal Grandfather    Lupus Daughter    Neurofibromatosis Son    Parkinson's disease Paternal Aunt    Parkinson's disease Paternal Uncle    Heart attack Other     Social History:  reports that he has been smoking cigarettes. He has a 35.00 pack-year smoking history. He has never used smokeless tobacco. He reports that he does not drink alcohol and does not use drugs.  ROS: All other review of systems were reviewed and are negative except what is noted above in HPI  Physical Exam: BP (!) 93/59   Pulse (!) 105   Constitutional:  Alert and oriented, No acute distress. HEENT: [place] AT, moist mucus membranes.  Trachea midline, no masses. Cardiovascular: No clubbing, cyanosis, or edema. Respiratory: Normal respiratory effort, no increased work of breathing. GI: Abdomen is soft, nontender, nondistended, no abdominal masses GU: No CVA tenderness.  Lymph: No cervical or inguinal lymphadenopathy. Skin: No rashes, bruises or suspicious lesions. Neurologic: Grossly intact, no focal deficits, moving all 4 extremities. Psychiatric: Normal mood and affect.  Laboratory Data: Lab Results  Component Value Date    WBC 9.6 11/15/2022   HGB 14.1 11/15/2022   HCT 42.5 11/15/2022   MCV 93.0 11/15/2022   PLT 259 11/15/2022    Lab Results  Component Value Date   CREATININE 2.89 (H) 01/13/2023    No results found for: "PSA"  No results found for: "TESTOSTERONE"  Lab Results  Component Value Date   HGBA1C 9.0 (H) 10/02/2022    Urinalysis    Component Value Date/Time   COLORURINE YELLOW 11/15/2022 0802   APPEARANCEUR CLEAR 11/15/2022 0802   APPEARANCEUR Clear 11/06/2022 1325   LABSPEC 1.019 11/15/2022 0802   PHURINE 6.0 11/15/2022 0802   GLUCOSEU NEGATIVE 11/15/2022 0802   HGBUR NEGATIVE 11/15/2022 0802   BILIRUBINUR NEGATIVE 11/15/2022 0802   BILIRUBINUR Negative 11/06/2022 1325   KETONESUR NEGATIVE 11/15/2022 0802   PROTEINUR NEGATIVE 11/15/2022 0802   UROBILINOGEN 0.2 12/21/2014 0845   NITRITE NEGATIVE 11/15/2022 0802   LEUKOCYTESUR NEGATIVE 11/15/2022 0802    Lab Results  Component Value Date   LABMICR Comment 11/06/2022   WBCUA None seen 07/25/2020   LABEPIT None seen 07/25/2020   MUCUS Present 07/25/2020   BACTERIA Few 07/25/2020    Pertinent Imaging:  Results for orders placed in visit on 12/31/22  DG Abd 1 View  Narrative CLINICAL DATA:  Kidney stone.  EXAM: ABDOMEN - 1 VIEW  COMPARISON:  09/20/2022.  FINDINGS: The bowel gas pattern is normal. No radio-opaque calculi or other significant radiographic abnormality are seen.  IMPRESSION: Negative.   Electronically Signed By: Layla Maw M.D. On: 01/05/2023 18:16  No results found for this or any previous visit.  No results found for this or any previous visit.  No results found for this or any previous visit.  Results for orders placed during the hospital encounter of 12/18/20  Ultrasound renal complete  Narrative CLINICAL DATA:  Nephrolithiasis.  EXAM: RENAL / URINARY TRACT ULTRASOUND COMPLETE  COMPARISON:  September 14, 2020.  FINDINGS: Right Kidney:  Renal measurements: 11.9 x  6.0 x 6.3 cm = volume: 239 mL. Probable 12 mm nonobstructive calculus seen in midpole  collecting system. Echogenicity within normal limits. No mass or hydronephrosis visualized.  Left Kidney:  Renal measurements: 13.4 x 5.7 x 5.6 cm = volume: 224 mL. Probable nonobstructive 7 mm calculus seen in midpole collecting system. Echogenicity within normal limits. No mass or hydronephrosis visualized.  Bladder:  Appears normal for degree of bladder distention. Bilateral ureteral jets are noted.  Other:  None.  IMPRESSION: Probable bilateral nonobstructive nephrolithiasis. No hydronephrosis or renal obstruction is noted.   Electronically Signed By: Lupita Raider M.D. On: 12/18/2020 13:39  No valid procedures specified. No results found for this or any previous visit.  Results for orders placed in visit on 09/11/22  CT RENAL STONE STUDY  Narrative CLINICAL DATA:  Bilateral flank pain, history of calculi  EXAM: CT ABDOMEN AND PELVIS WITHOUT CONTRAST  TECHNIQUE: Multidetector CT imaging of the abdomen and pelvis was performed following the standard protocol without IV contrast.  RADIATION DOSE REDUCTION: This exam was performed according to the departmental dose-optimization program which includes automated exposure control, adjustment of the mA and/or kV according to patient size and/or use of iterative reconstruction technique.  COMPARISON:  09/14/2020 and radiographs from 12/26/2021  FINDINGS: Lower chest: Unremarkable  Hepatobiliary: Unremarkable  Pancreas: Unremarkable  Spleen: Unremarkable  Adrenals/Urinary Tract: Stable 3.7 by 4.1 cm right adrenal myelolipoma, image 19 series 2. No signs of interval hematoma. This lesion is benign and does not require further imaging workup.  Cluster calculi in the right kidney lower pole measuring up to 1.1 cm in long axis. There is also a 2 mm right kidney upper pole nonobstructive renal calculus.  Two 2 mm left  renal calculi are present.  No hydronephrosis or hydroureter.  No ureteral or bladder calculus.  Stomach/Bowel: Unremarkable.  Normal appendix.  Vascular/Lymphatic: Atherosclerosis is present, including aortoiliac atherosclerotic disease.  Reproductive: Borderline prostatomegaly.  Other: No supplemental non-categorized findings.  Musculoskeletal: Mild lumbar spondylosis and degenerative disc disease.  IMPRESSION: 1. Bilateral nonobstructive nephrolithiasis. 2. Stable 4.1 cm right adrenal myelolipoma. This lesion is benign and does not require further imaging workup. 3. Borderline prostatomegaly. 4. Mild lumbar spondylosis and degenerative disc disease. 5. Aortic atherosclerosis.  Aortic Atherosclerosis (ICD10-I70.0).   Electronically Signed By: Gaylyn Rong M.D. On: 09/11/2022 15:04   Assessment & Plan:    1. Urinary retention -increase flomax to BID - Bladder Voiding Trial - ciprofloxacin (CIPRO) tablet 500 mg  2. Nephrolithiasis - tamsulosin (FLOMAX) 0.4 MG CAPS capsule; Take 1 capsule (0.4 mg total) by mouth 2 (two) times daily.  Dispense: 90 capsule; Refill: 3   No follow-ups on file.  Wilkie Aye, MD  [facility] [place]

## 2023-01-22 NOTE — Progress Notes (Signed)
Fill and Pull Catheter Removal  Patient is present today for a catheter removal.  Patient was cleaned and prepped in a sterile fashion of sterile water/ saline was instilled into the bladder when the patient felt the urge to urinate. 10ml of water was then drained from the balloon.  A 16FR foley cath was removed from the bladder no complications were noted .  Patient as then given some time to void on their own.  Patient can void  on their own after some time.  Patient tolerated well.  Performed by: Marchelle Folks RN  Follow up/ Additional notes: 1 po cipro given

## 2023-01-27 ENCOUNTER — Ambulatory Visit (INDEPENDENT_AMBULATORY_CARE_PROVIDER_SITE_OTHER): Payer: Medicare Other | Admitting: Gastroenterology

## 2023-01-27 ENCOUNTER — Encounter (INDEPENDENT_AMBULATORY_CARE_PROVIDER_SITE_OTHER): Payer: Self-pay | Admitting: Gastroenterology

## 2023-01-27 VITALS — BP 105/73 | HR 91 | Temp 98.2°F | Ht 67.0 in | Wt 225.2 lb

## 2023-01-27 DIAGNOSIS — K5190 Ulcerative colitis, unspecified, without complications: Secondary | ICD-10-CM

## 2023-01-27 NOTE — Progress Notes (Signed)
Referring Provider: Tommie Sams, DO Primary Care Physician:  Tommie Sams, DO Primary GI Physician: Levon Hedger   Chief Complaint  Patient presents with   Ulcerative Colitis    Follow up on ulcerative colitis. States no issues with abdominal pain and no blood in stool.    Hemorrhoids    Follow up on hemorrhoids. States doing well now. States he had issue with hemorrhoid and had to cancel colonoscopy.    HPI:   Shaun Harris is a 57 y.o. male with past medical history of UC, stroke, anxiety, COPD, urolithiasis, DM, HLD, HTN, OSA   Patient presenting today for follow up of UC/rescheduling colonoscopy  Last seen March 2024. He was scheduled for colonoscopy at the end of March but presented with significant rectal pain, swelling and tenderness, found to have thrombosed hemorrhoid, colonoscopy was cancelled and patient referred to general surgery.  Notably patient also had bump in his creatnine and potassium around that time, was advised to hold potassium and have close follow up with nephrology   Also with recent nephrolithiasis, requiring foley catheter placement. Most recent BMP on 7/1 with creat 2.89, potassium 5.7, was advised to schedule urgent appt with nephrologist by his PCP at that time.   CBC in May WNL   Present:  He saw Dr. Wolfgang Phoenix last week and has more labs coming up tomorrow. He states that nephrologist is considering dialysis given his ongoing elevated creatnine.    No further issues with hemorrhoids since march, had I&D on his thrombosed hemorrhoid in March.   He states that he has no abdominal pain, he is having a BM daily. Using miralax and stool softener. No rectal bleeding or melena. Weight seems to fluctuate a bit. Reports appetite is not great.   He has some nausea from his kidney stone, catheter was removed last Wednesday but he notes that urine output has not been good.    Last Colonoscopy:09/2016 Exam performed to the cecum. No evidence of active colitis.  Patchy edema at the sigmoid colon and rectum.  Biopsies taken. Diagnosis: colon ascending colon: focal active acute inflammation. Colon descending: Mild increase in chronic inflammation. Colon sigmoid and rectum: chronic changes changes but significant inflammation. Hyperplastic polyp.    Past Medical History:  Diagnosis Date   Anxiety    Arthritis    Cerebral vasculitis    Chronic headaches    constant since possible stroke in 1997,"Vasculitis of brain with increased pressure of brain ".   COPD (chronic obstructive pulmonary disease) (HCC)    COPD (chronic obstructive pulmonary disease) (HCC)    Diabetes mellitus    Hyperlipidemia    Hypertension    Hypertriglyceridemia    Sleep apnea    has but doesnt use, cannot tolerate; PCP aware   Stroke [facility])    pt states,"they arent sure if i had a stroke in 1997, there is still a question about that with my doctors". No deficits.   Tobacco abuse    Ulcerative colitis (HCC)    Ulcerative colitis (HCC)    Venous stasis     Past Surgical History:  Procedure Laterality Date   BIOPSY  10/25/2016   Procedure: BIOPSY;  Surgeon: Malissa Hippo, MD;  [facility]: [facility];  Service: Endoscopy;;  colon   CERVICAL SPINE SURGERY  2008   x2-last surgery 04/24/2016.   COLONOSCOPY WITH PROPOFOL N/A 10/25/2016   Procedure: COLONOSCOPY WITH PROPOFOL;  Surgeon: Malissa Hippo, MD;  Location: [facility];  Service:  Endoscopy;  Laterality: N/A;  7:30   EXTRACORPOREAL SHOCK WAVE LITHOTRIPSY Left 08/08/2020   Procedure: EXTRACORPOREAL SHOCK WAVE LITHOTRIPSY (ESWL);  Surgeon: Malen Gauze, MD;  Location: [facility];  Service: Urology;  Laterality: Left;   KNEE ARTHROSCOPY  02/25/2012   Procedure: ARTHROSCOPY KNEE;  Surgeon: Darreld Mclean, MD;  Location: [facility];  Service: Orthopedics;  Laterality: Right;   KNEE ARTHROSCOPY WITH MEDIAL MENISECTOMY Left 12/23/2014   Procedure: LEFT KNEE ARTHROSCOPY WITH PARTIAL MENISECTOMY;  Surgeon: Darreld Mclean, MD;   Location: [facility];  Service: Orthopedics;  Laterality: Left;   POLYPECTOMY  10/25/2016   Procedure: POLYPECTOMY;  Surgeon: Malissa Hippo, MD;  Location: [facility];  Service: Endoscopy;;  colon   SHOULDER ARTHROSCOPY  2004   left shoulder   SHOULDER ARTHROSCOPY  2008   right shoulder    Current Outpatient Medications  Medication Sig Dispense Refill   acetaminophen (TYLENOL) 500 MG tablet Take 1,000 mg by mouth every 6 (six) hours as needed for moderate pain.     AIMOVIG 70 MG/ML SOAJ Inject 70 mg into the skin every 30 (thirty) days.     albuterol (VENTOLIN HFA) 108 (90 Base) MCG/ACT inhaler inhale TWO puffs by MOUTH every SIX hours IN THE LEFT EAR needed FOR shortness of breath 18 g 5   ALPRAZolam (XANAX) 0.25 MG tablet Take 0.5 mg by mouth 3 (three) times daily.     aspirin EC 325 MG tablet Take 325 mg by mouth at bedtime.     atorvastatin (LIPITOR) 20 MG tablet TAKE ONE TABLET BY MOUTH EVERY DAY 90 tablet 1   BD VEO INSULIN SYRINGE U/F 31G X 15/64" 1 ML MISC AS DIRECTED 100 each 0   Blood Glucose Monitoring Suppl w/Device KIT Test glucose 4 times daily. E11.9 100 each 5   buPROPion (WELLBUTRIN SR) 150 MG 12 hr tablet TAKE ONE TABLET BY MOUTH TWICE DAILY 180 tablet 1   cyclobenzaprine (FLEXERIL) 5 MG tablet Take 1 tablet (5 mg total) by mouth 3 (three) times daily as needed for muscle spasms. 30 tablet 1   fluticasone-salmeterol (ADVAIR) 250-50 MCG/ACT AEPB inhale ONE PUFF by MOUTH every 12 hours. <<rinse MOUTH AFTER use>> 180 each 1   glucose blood (TRUE METRIX BLOOD GLUCOSE TEST) test strip check blood sugar FOUR TIMES DAILY 100 each 0   insulin aspart (NOVOLOG) 100 UNIT/ML injection Inject 14-20 Units into the skin 3 (three) times daily with meals. 30 mL 3   insulin degludec (TRESIBA FLEXTOUCH) 200 UNIT/ML FlexTouch Pen Inject 80 Units into the skin at bedtime. 18 mL 11   levETIRAcetam (KEPPRA) 500 MG tablet TAKE 3 TABLETS BY MOUTH TWICE DAILY. 540 tablet 1   Mesalamine (ASACOL) 400  MG CPDR DR capsule TAKE ONE CAPSULE BY MOUTH FOUR TIMES DAILY 360 capsule 1   morphine (MS CONTIN) 30 MG 12 hr tablet Take 1 tablet (30 mg total) by mouth 2 (two) times daily. May fill 60 days after 09/30/2017 60 tablet 0   Multiple Vitamins-Minerals (CENTRUM PO) Take 1 tablet by mouth daily.     ondansetron (ZOFRAN-ODT) 8 MG disintegrating tablet Take 1 tablet (8 mg total) by mouth every 8 (eight) hours as needed for nausea or vomiting. 20 tablet 6   Oxycodone HCl 10 MG TABS Take 1 tablet by mouth 4 times a day as needed for pain. Do not drive or operate machinery while on this medicine. (Patient taking differently: Take 10 mg by mouth 4 (four) times daily as needed (  pain). Do not drive or operate machinery while on this medicine.) 120 tablet 0   polyethylene glycol (MIRALAX) 17 g packet Take 17 g by mouth daily. 14 each 0   QUEtiapine (SEROQUEL) 100 MG tablet Take 100 mg by mouth at bedtime.     sodium bicarbonate 650 MG tablet TAKE 1 TABLET BY MOUTH TWICE DAILY 60 tablet 11   tamsulosin (FLOMAX) 0.4 MG CAPS capsule Take 1 capsule (0.4 mg total) by mouth 2 (two) times daily. 90 capsule 3   traZODone (DESYREL) 100 MG tablet Take 100 mg by mouth at bedtime.     TRUEPLUS INSULIN SYRINGE 31G X 5/16" 1 ML MISC AS DIRECTED 100 each 0   venlafaxine XR (EFFEXOR-XR) 37.5 MG 24 hr capsule Take 1 capsule (37.5 mg total) by mouth daily. 90 capsule 3   vitamin C (ASCORBIC ACID) 500 MG tablet Take 500 mg by mouth daily.     hydrOXYzine (ATARAX/VISTARIL) 50 MG tablet Take 50 mg by mouth daily as needed for anxiety. (Patient not taking: Reported on 01/27/2023)     No current facility-administered medications for this visit.    Allergies as of 01/27/2023 - Review Complete 01/27/2023  Allergen Reaction Noted   Gabapentin Other (See Comments) 03/21/2016    Family History  Problem Relation Age of Onset   Hypertension Father    Diabetes Father    Heart disease Father    Parkinson's disease Father     Hypertension Sister    Heart disease Brother        both brothers have heart disease   Heart disease Maternal Grandfather    Lupus Daughter    Neurofibromatosis Son    Parkinson's disease Paternal Aunt    Parkinson's disease Paternal Uncle    Heart attack Other     Social History   Socioeconomic History   Marital status: Widowed    Spouse name: Not on file   Number of children: Not on file   Years of education: Not on file   Highest education level: Not on file  Occupational History   Not on file  Tobacco Use   Smoking status: Every Day    Current packs/day: 1.00    Average packs/day: 1 pack/day for 35.0 years (35.0 ttl pk-yrs)    Types: Cigarettes   Smokeless tobacco: Never  Vaping Use   Vaping status: Never Used  Substance and Sexual Activity   Alcohol use: No   Drug use: No   Sexual activity: Yes    Birth control/protection: None  Other Topics Concern   Not on file  Social History Narrative   Disabled   Right handed   Social Determinants of Health   Financial Resource Strain: Not on file  Food Insecurity: Not on file  Transportation Needs: Not on file  Physical Activity: Not on file  Stress: Not on file  Social Connections: Not on file   Review of systems General: negative for malaise, night sweats, fever, chills, weight loss Neck: Negative for lumps, goiter, pain and significant neck swelling Resp: Negative for cough, wheezing, dyspnea at rest CV: Negative for chest pain, leg swelling, palpitations, orthopnea GI: denies melena, hematochezia, nausea, vomiting, diarrhea, constipation, dysphagia, odyonophagia, early satiety or unintentional weight loss.  MSK: Negative for joint pain or swelling, back pain, and muscle pain. Derm: Negative for itching or rash Psych: Denies depression, anxiety, memory loss, confusion. No homicidal or suicidal ideation.  Heme: Negative for prolonged bleeding, bruising easily, and swollen nodes. Endocrine: Negative for  cold or  heat intolerance, polyuria, polydipsia and goiter. Neuro: negative for tremor, gait imbalance, syncope and seizures. The remainder of the review of systems is noncontributory.  Physical Exam: Ht 5\' 7"  (1.702 m)   Wt 225 lb 3.2 oz (102.2 kg)   BMI 35.27 kg/m  General:   Alert and oriented. No distress noted. Pleasant and cooperative.  Head:  Normocephalic and atraumatic. Eyes:  Conjuctiva clear without scleral icterus. Mouth:  Oral mucosa pink and moist. Good dentition. No lesions. Heart: Normal rate and rhythm, s1 and s2 heart sounds present.  Lungs: Clear lung sounds in all lobes. Respirations equal and unlabored. Abdomen:  +BS, soft, non-tender and non-distended. No rebound or guarding. No HSM or masses noted. Derm: No palmar erythema or jaundice Msk:  Symmetrical without gross deformities. Normal posture. Extremities:  Without edema. Neurologic:  Alert and  oriented x4 Psych:  Alert and cooperative. Normal mood and affect.  Invalid input(s): "6 MONTHS"   ASSESSMENT: Shaun Harris is a 57 y.o. male presenting today for follow up of ulcerative colitis  Patient appears to be doing well clinically from UC standpoint, maintained on mesalamine 1.6g daily. Previously with some constipation, likely secondary to ongoing opiate use, using miralax and stool softener which has resolved his constipation. Previous colonoscopy in March was cancelled due to rectal pain secondary to thrombosed hemorrhoid which was I&D'ed in march by general surgery. He has had recent complications with nephrolithiasis and worsening kidney disease. Notably last creatnine earlier this month was 2.89 with potassium of 5.7. he has follow up with nephrology tomorrow with more labs as well. Given his hyperkalemia and significantly elevated creatnine, would recommend seeing nephrology and reviewing repeat labs prior to scheduling colonoscopy as his potassium ideally needs to be closer to WNL. I have requested he ask nephrology  to fax over his most recent labs for [place] to review in hopes of getting him reschedule for his colonoscopy in the near future.    PLAN:  Continue mesalamine 1.6g daily 2. Continue with miralax and stool softener  3. Reschedule colonoscopy pending potassium/renal function on upcoming labs with nephrology  All questions were answered, patient verbalized understanding and is in agreement with plan as outlined above.    Follow Up: 3 months   Evelyne Makepeace L. Jeanmarie Hubert, MSN, APRN, AGNP-C Adult-Gerontology Nurse Practitioner [facility]  I have reviewed the note and agree with the APP's assessment as described in this progress note It is particular that he has presented some worsening kidney function since 2022.  He is currently being evaluated by nephrology for this.  I think it is less likely related to mesalamine as he was on Apriso since at least 2018 and he was switched to mesalamine on early 2021.  Was only on mid 2022 that his kidney function started deteriorating.  If nephrology considers this needs to be discontinued, we can consider other medication treatment for his ulcerative colitis. We will order urinalysis with urinary sediment to rule out interstitial nephritis.  Katrinka Blazing, MD Gastroenterology and Hepatology [facility]

## 2023-01-27 NOTE — Patient Instructions (Addendum)
We will continue with mesalamine at current dosage, please continue with miralax and stool softener We need to get you rescheduled for colonoscopy but will need your potassium to closer to normal limits before doing so, please have nephrology fax over most recent labs when you see them at your upcoming appt  Follow up 3 months

## 2023-01-28 ENCOUNTER — Encounter: Payer: Self-pay | Admitting: Urology

## 2023-01-28 NOTE — Patient Instructions (Signed)

## 2023-01-29 ENCOUNTER — Ambulatory Visit (INDEPENDENT_AMBULATORY_CARE_PROVIDER_SITE_OTHER): Payer: Medicare Other | Admitting: Urology

## 2023-01-29 ENCOUNTER — Encounter: Payer: Self-pay | Admitting: Urology

## 2023-01-29 VITALS — BP 140/88 | HR 109

## 2023-01-29 DIAGNOSIS — R339 Retention of urine, unspecified: Secondary | ICD-10-CM

## 2023-01-29 DIAGNOSIS — N200 Calculus of kidney: Secondary | ICD-10-CM | POA: Diagnosis not present

## 2023-01-29 LAB — BLADDER SCAN AMB NON-IMAGING: Scan Result: 109

## 2023-01-29 MED ORDER — TAMSULOSIN HCL 0.4 MG PO CAPS
0.4000 mg | ORAL_CAPSULE | Freq: Two times a day (BID) | ORAL | 3 refills | Status: DC
Start: 2023-01-29 — End: 2023-03-26

## 2023-01-29 NOTE — Progress Notes (Signed)
01/29/2023 3:05 PM   Shaun Harris October 16, 1965 [id number]  Referring provider: Tommie Sams, DO [address] [place],  [place] [address]  Followup urinary retention   HPI: Shaun Harris is a 57yo here for followup for urinary retention. PVR 128cc. He remains on flomax 0.4mg .  He has decreased urine output and is only urinating 5-6 ounces every day. His creatinine was 2.86 on 01/13/2023. He denies any straining to urinate.   PMH: Past Medical History:  Diagnosis Date   Anxiety    Arthritis    Cerebral vasculitis    Chronic headaches    constant since possible stroke in 1997,"Vasculitis of brain with increased pressure of brain ".   COPD (chronic obstructive pulmonary disease) (HCC)    COPD (chronic obstructive pulmonary disease) (HCC)    Diabetes mellitus    Hyperlipidemia    Hypertension    Hypertriglyceridemia    Sleep apnea    has but doesnt use, cannot tolerate; PCP aware   Stroke [facility])    pt states,"they arent sure if i had a stroke in 1997, there is still a question about that with my doctors". No deficits.   Tobacco abuse    Ulcerative colitis (HCC)    Ulcerative colitis (HCC)    Venous stasis     Surgical History: Past Surgical History:  Procedure Laterality Date   BIOPSY  10/25/2016   Procedure: BIOPSY;  Surgeon: Malissa Hippo, MD;  Location: [facility];  Service: Endoscopy;;  colon   CERVICAL SPINE SURGERY  2008   x2-last surgery 04/24/2016.   COLONOSCOPY WITH PROPOFOL N/A 10/25/2016   Procedure: COLONOSCOPY WITH PROPOFOL;  Surgeon: Malissa Hippo, MD;  Location: [facility];  Service: Endoscopy;  Laterality: N/A;  7:30   EXTRACORPOREAL SHOCK WAVE LITHOTRIPSY Left 08/08/2020   Procedure: EXTRACORPOREAL SHOCK WAVE LITHOTRIPSY (ESWL);  Surgeon: Malen Gauze, MD;  Location: [facility];  Service: Urology;  Laterality: Left;   KNEE ARTHROSCOPY  02/25/2012   Procedure: ARTHROSCOPY KNEE;  Surgeon: Darreld Mclean, MD;  Location: [facility];  Service: Orthopedics;   Laterality: Right;   KNEE ARTHROSCOPY WITH MEDIAL MENISECTOMY Left 12/23/2014   Procedure: LEFT KNEE ARTHROSCOPY WITH PARTIAL MENISECTOMY;  Surgeon: Darreld Mclean, MD;  Location: [facility];  Service: Orthopedics;  Laterality: Left;   POLYPECTOMY  10/25/2016   Procedure: POLYPECTOMY;  Surgeon: Malissa Hippo, MD;  [facility]: [facility];  Service: Endoscopy;;  colon   SHOULDER ARTHROSCOPY  2004   left shoulder   SHOULDER ARTHROSCOPY  2008   right shoulder    Home Medications:  Allergies as of 01/29/2023       Reactions   Gabapentin Other (See Comments)   Increased blood sugar        Medication List        Accurate as of January 29, 2023  3:05 PM. If you have any questions, ask your nurse or doctor.          acetaminophen 500 MG tablet Commonly known as: TYLENOL Take 1,000 mg by mouth every 6 (six) hours as needed for moderate pain.   Aimovig 70 MG/ML Soaj Generic drug: Erenumab-aooe Inject 70 mg into the skin every 30 (thirty) days.   albuterol 108 (90 Base) MCG/ACT inhaler Commonly known as: Ventolin HFA inhale TWO puffs by MOUTH every SIX hours IN THE LEFT EAR needed FOR shortness of breath   ALPRAZolam 0.25 MG tablet Commonly known as: XANAX Take 0.5 mg by mouth 3 (  three) times daily.   ascorbic acid 500 MG tablet Commonly known as: VITAMIN C Take 500 mg by mouth daily.   aspirin EC 325 MG tablet Take 325 mg by mouth at bedtime.   atorvastatin 20 MG tablet Commonly known as: LIPITOR TAKE ONE TABLET BY MOUTH EVERY DAY   Blood Glucose Monitoring Suppl w/Device Kit Test glucose 4 times daily. E11.9   buPROPion 150 MG 12 hr tablet Commonly known as: WELLBUTRIN SR TAKE ONE TABLET BY MOUTH TWICE DAILY   CENTRUM PO Take 1 tablet by mouth daily.   cyclobenzaprine 5 MG tablet Commonly known as: FLEXERIL Take 1 tablet (5 mg total) by mouth 3 (three) times daily as needed for muscle spasms.   fluticasone-salmeterol 250-50 MCG/ACT Aepb Commonly known as:  ADVAIR inhale ONE PUFF by MOUTH every 12 hours. <<rinse MOUTH AFTER use>>   hydrOXYzine 50 MG tablet Commonly known as: ATARAX Take 50 mg by mouth daily as needed for anxiety.   insulin aspart 100 UNIT/ML injection Commonly known as: NovoLOG Inject 14-20 Units into the skin 3 (three) times daily with meals.   levETIRAcetam 500 MG tablet Commonly known as: KEPPRA TAKE 3 TABLETS BY MOUTH TWICE DAILY.   Mesalamine 400 MG Cpdr DR capsule Commonly known as: ASACOL TAKE ONE CAPSULE BY MOUTH FOUR TIMES DAILY   morphine 30 MG 12 hr tablet Commonly known as: MS CONTIN Take 1 tablet (30 mg total) by mouth 2 (two) times daily. May fill 60 days after 09/30/2017   ondansetron 8 MG disintegrating tablet Commonly known as: ZOFRAN-ODT Take 1 tablet (8 mg total) by mouth every 8 (eight) hours as needed for nausea or vomiting.   Oxycodone HCl 10 MG Tabs Take 1 tablet by mouth 4 times a day as needed for pain. Do not drive or operate machinery while on this medicine. What changed:  how much to take how to take this when to take this reasons to take this additional instructions   polyethylene glycol 17 g packet Commonly known as: MiraLax Take 17 g by mouth daily.   QUEtiapine 100 MG tablet Commonly known as: SEROQUEL Take 100 mg by mouth at bedtime.   sodium bicarbonate 650 MG tablet TAKE 1 TABLET BY MOUTH TWICE DAILY   tamsulosin 0.4 MG Caps capsule Commonly known as: FLOMAX Take 1 capsule (0.4 mg total) by mouth 2 (two) times daily.   traZODone 100 MG tablet Commonly known as: DESYREL Take 100 mg by mouth at bedtime.   Evaristo Bury FlexTouch 200 UNIT/ML FlexTouch Pen Generic drug: insulin degludec Inject 80 Units into the skin at bedtime.   True Metrix Blood Glucose Test test strip Generic drug: glucose blood check blood sugar FOUR TIMES DAILY   TRUEplus Insulin Syringe 31G X 5/16" 1 ML Misc Generic drug: Insulin Syringe-Needle U-100 AS DIRECTED   BD Veo Insulin Syringe  U/F 31G X 15/64" 1 ML Misc Generic drug: Insulin Syringe-Needle U-100 AS DIRECTED   venlafaxine XR 37.5 MG 24 hr capsule Commonly known as: EFFEXOR-XR Take 1 capsule (37.5 mg total) by mouth daily.        Allergies:  Allergies  Allergen Reactions   Gabapentin Other (See Comments)    Increased blood sugar    Family History: Family History  Problem Relation Age of Onset   Hypertension Father    Diabetes Father    Heart disease Father    Parkinson's disease Father    Hypertension Sister    Heart disease Brother  both brothers have heart disease   Heart disease Maternal Grandfather    Lupus Daughter    Neurofibromatosis Son    Parkinson's disease Paternal Aunt    Parkinson's disease Paternal Uncle    Heart attack Other     Social History:  reports that he has been smoking cigarettes. He has a 35 pack-year smoking history. He has been exposed to tobacco smoke. He has never used smokeless tobacco. He reports that he does not drink alcohol and does not use drugs.  ROS: All other review of systems were reviewed and are negative except what is noted above in HPI  Physical Exam: BP (!) 140/88   Pulse (!) 109   Constitutional:  Alert and oriented, No acute distress. HEENT: [place] AT, moist mucus membranes.  Trachea midline, no masses. Cardiovascular: No clubbing, cyanosis, or edema. Respiratory: Normal respiratory effort, no increased work of breathing. GI: Abdomen is soft, nontender, nondistended, no abdominal masses GU: No CVA tenderness.  Lymph: No cervical or inguinal lymphadenopathy. Skin: No rashes, bruises or suspicious lesions. Neurologic: Grossly intact, no focal deficits, moving all 4 extremities. Psychiatric: Normal mood and affect.  Laboratory Data: Lab Results  Component Value Date   WBC 9.6 11/15/2022   HGB 14.1 11/15/2022   HCT 42.5 11/15/2022   MCV 93.0 11/15/2022   PLT 259 11/15/2022    Lab Results  Component Value Date   CREATININE 2.89 (H)  01/13/2023    No results found for: "PSA"  No results found for: "TESTOSTERONE"  Lab Results  Component Value Date   HGBA1C 9.0 (H) 10/02/2022    Urinalysis    Component Value Date/Time   COLORURINE YELLOW 11/15/2022 0802   APPEARANCEUR CLEAR 11/15/2022 0802   APPEARANCEUR Clear 11/06/2022 1325   LABSPEC 1.019 11/15/2022 0802   PHURINE 6.0 11/15/2022 0802   GLUCOSEU NEGATIVE 11/15/2022 0802   HGBUR NEGATIVE 11/15/2022 0802   BILIRUBINUR NEGATIVE 11/15/2022 0802   BILIRUBINUR Negative 11/06/2022 1325   KETONESUR NEGATIVE 11/15/2022 0802   PROTEINUR NEGATIVE 11/15/2022 0802   UROBILINOGEN 0.2 12/21/2014 0845   NITRITE NEGATIVE 11/15/2022 0802   LEUKOCYTESUR NEGATIVE 11/15/2022 0802    Lab Results  Component Value Date   LABMICR Comment 11/06/2022   WBCUA None seen 07/25/2020   LABEPIT None seen 07/25/2020   MUCUS Present 07/25/2020   BACTERIA Few 07/25/2020    Pertinent Imaging:  Results for orders placed in visit on 12/31/22  DG Abd 1 View  Narrative CLINICAL DATA:  Kidney stone.  EXAM: ABDOMEN - 1 VIEW  COMPARISON:  09/20/2022.  FINDINGS: The bowel gas pattern is normal. No radio-opaque calculi or other significant radiographic abnormality are seen.  IMPRESSION: Negative.   Electronically Signed By: Layla Maw M.D. On: 01/05/2023 18:16  No results found for this or any previous visit.  No results found for this or any previous visit.  No results found for this or any previous visit.  Results for orders placed during the hospital encounter of 12/18/20  Ultrasound renal complete  Narrative CLINICAL DATA:  Nephrolithiasis.  EXAM: RENAL / URINARY TRACT ULTRASOUND COMPLETE  COMPARISON:  September 14, 2020.  FINDINGS: Right Kidney:  Renal measurements: 11.9 x 6.0 x 6.3 cm = volume: 239 mL. Probable 12 mm nonobstructive calculus seen in midpole collecting system. Echogenicity within normal limits. No mass or  hydronephrosis visualized.  Left Kidney:  Renal measurements: 13.4 x 5.7 x 5.6 cm = volume: 224 mL. Probable nonobstructive 7 mm calculus seen in midpole collecting system.  Echogenicity within normal limits. No mass or hydronephrosis visualized.  Bladder:  Appears normal for degree of bladder distention. Bilateral ureteral jets are noted.  Other:  None.  IMPRESSION: Probable bilateral nonobstructive nephrolithiasis. No hydronephrosis or renal obstruction is noted.   Electronically Signed By: Lupita Raider M.D. On: 12/18/2020 13:39  No valid procedures specified. No results found for this or any previous visit.  Results for orders placed in visit on 09/11/22  CT RENAL STONE STUDY  Narrative CLINICAL DATA:  Bilateral flank pain, history of calculi  EXAM: CT ABDOMEN AND PELVIS WITHOUT CONTRAST  TECHNIQUE: Multidetector CT imaging of the abdomen and pelvis was performed following the standard protocol without IV contrast.  RADIATION DOSE REDUCTION: This exam was performed according to the departmental dose-optimization program which includes automated exposure control, adjustment of the mA and/or kV according to patient size and/or use of iterative reconstruction technique.  COMPARISON:  09/14/2020 and radiographs from 12/26/2021  FINDINGS: Lower chest: Unremarkable  Hepatobiliary: Unremarkable  Pancreas: Unremarkable  Spleen: Unremarkable  Adrenals/Urinary Tract: Stable 3.7 by 4.1 cm right adrenal myelolipoma, image 19 series 2. No signs of interval hematoma. This lesion is benign and does not require further imaging workup.  Cluster calculi in the right kidney lower pole measuring up to 1.1 cm in long axis. There is also a 2 mm right kidney upper pole nonobstructive renal calculus.  Two 2 mm left renal calculi are present.  No hydronephrosis or hydroureter.  No ureteral or bladder calculus.  Stomach/Bowel: Unremarkable.  Normal  appendix.  Vascular/Lymphatic: Atherosclerosis is present, including aortoiliac atherosclerotic disease.  Reproductive: Borderline prostatomegaly.  Other: No supplemental non-categorized findings.  Musculoskeletal: Mild lumbar spondylosis and degenerative disc disease.  IMPRESSION: 1. Bilateral nonobstructive nephrolithiasis. 2. Stable 4.1 cm right adrenal myelolipoma. This lesion is benign and does not require further imaging workup. 3. Borderline prostatomegaly. 4. Mild lumbar spondylosis and degenerative disc disease. 5. Aortic atherosclerosis.  Aortic Atherosclerosis (ICD10-I70.0).   Electronically Signed By: Gaylyn Rong M.D. On: 09/11/2022 15:04   Assessment & Plan:    1. Urinary retention Continue flomax 0.4mg  BID - Urinalysis, Routine w reflex microscopic - BLADDER SCAN AMB NON-IMAGING   No follow-ups on file.  Wilkie Aye, MD  [facility] [place]

## 2023-01-30 ENCOUNTER — Telehealth (INDEPENDENT_AMBULATORY_CARE_PROVIDER_SITE_OTHER): Payer: Self-pay | Admitting: *Deleted

## 2023-01-30 ENCOUNTER — Other Ambulatory Visit (INDEPENDENT_AMBULATORY_CARE_PROVIDER_SITE_OTHER): Payer: Self-pay | Admitting: Gastroenterology

## 2023-01-30 DIAGNOSIS — N1831 Chronic kidney disease, stage 3a: Secondary | ICD-10-CM

## 2023-01-30 DIAGNOSIS — Z79899 Other long term (current) drug therapy: Secondary | ICD-10-CM

## 2023-01-30 NOTE — Telephone Encounter (Signed)
Discussed with pt and he states he will go to [facility] tomorrow.

## 2023-01-30 NOTE — Telephone Encounter (Signed)
Copied from cc chart so I could document on:  Per chelsea - Can we let him know, Dr. Levon Hedger recommended checking Urine testing to ensure mesalamine was not contributing to his kidney issues. I will place the orders.   Left message to return call.

## 2023-02-01 LAB — UA/M W/RFLX CULTURE, COMP
Bilirubin, UA: NEGATIVE
Glucose, UA: NEGATIVE
Ketones, UA: NEGATIVE
Specific Gravity, UA: 1.028 (ref 1.005–1.030)
Urobilinogen, Ur: 0.2 mg/dL (ref 0.2–1.0)

## 2023-02-01 LAB — MICROSCOPIC EXAMINATION

## 2023-02-01 LAB — URINE CULTURE, COMPREHENSIVE

## 2023-02-01 LAB — SPECIMEN STATUS REPORT

## 2023-02-03 LAB — UA/M W/RFLX CULTURE, COMP
Nitrite, UA: NEGATIVE
pH, UA: 5.5 (ref 5.0–7.5)

## 2023-02-03 LAB — MICROSCOPIC EXAMINATION
Bacteria, UA: NONE SEEN
Casts: NONE SEEN /LPF
WBC, UA: >30 /hpf — AB (ref 0–5)

## 2023-03-03 ENCOUNTER — Encounter: Payer: Self-pay | Admitting: Nurse Practitioner

## 2023-03-03 ENCOUNTER — Ambulatory Visit (INDEPENDENT_AMBULATORY_CARE_PROVIDER_SITE_OTHER): Payer: Medicare Other | Admitting: Nurse Practitioner

## 2023-03-03 VITALS — BP 117/71 | HR 97 | Ht 67.0 in | Wt 245.4 lb

## 2023-03-03 DIAGNOSIS — Z794 Long term (current) use of insulin: Secondary | ICD-10-CM | POA: Diagnosis not present

## 2023-03-03 DIAGNOSIS — F17200 Nicotine dependence, unspecified, uncomplicated: Secondary | ICD-10-CM

## 2023-03-03 DIAGNOSIS — E1122 Type 2 diabetes mellitus with diabetic chronic kidney disease: Secondary | ICD-10-CM

## 2023-03-03 DIAGNOSIS — N183 Chronic kidney disease, stage 3 unspecified: Secondary | ICD-10-CM

## 2023-03-03 LAB — POCT GLYCOSYLATED HEMOGLOBIN (HGB A1C): Hemoglobin A1C: 6.4 % — AB (ref 4.0–5.6)

## 2023-03-03 MED ORDER — TRESIBA FLEXTOUCH 200 UNIT/ML ~~LOC~~ SOPN: 70.0000 [IU] | PEN_INJECTOR | Freq: Every day | SUBCUTANEOUS | 3 refills | Status: DC

## 2023-03-03 NOTE — Progress Notes (Signed)
Endocrinology Follow Up Note       03/03/2023, 8:41 AM   Subjective:    Patient ID: Shaun Harris, male    DOB: 11-Mar-1966.  Shaun Harris is being seen in follow up after being seen in consultation for management of currently uncontrolled symptomatic diabetes requested by  Tommie Sams, DO.   Past Medical History:  Diagnosis Date   Anxiety    Arthritis    Cerebral vasculitis    Chronic headaches    constant since possible stroke in 1997,"Vasculitis of brain with increased pressure of brain ".   COPD (chronic obstructive pulmonary disease) (HCC)    COPD (chronic obstructive pulmonary disease) (HCC)    Diabetes mellitus    Hyperlipidemia    Hypertension    Hypertriglyceridemia    Sleep apnea    has but doesnt use, cannot tolerate; PCP aware   Stroke [facility])    pt states,"they arent sure if i had a stroke in 1997, there is still a question about that with my doctors". No deficits.   Tobacco abuse    Ulcerative colitis (HCC)    Ulcerative colitis (HCC)    Venous stasis     Past Surgical History:  Procedure Laterality Date   BIOPSY  10/25/2016   Procedure: BIOPSY;  Surgeon: Malissa Hippo, MD;  Location: [facility];  Service: Endoscopy;;  colon   CERVICAL SPINE SURGERY  2008   x2-last surgery 04/24/2016.   COLONOSCOPY WITH PROPOFOL N/A 10/25/2016   Procedure: COLONOSCOPY WITH PROPOFOL;  Surgeon: Malissa Hippo, MD;  Location: [facility];  Service: Endoscopy;  Laterality: N/A;  7:30   EXTRACORPOREAL SHOCK WAVE LITHOTRIPSY Left 08/08/2020   Procedure: EXTRACORPOREAL SHOCK WAVE LITHOTRIPSY (ESWL);  Surgeon: Malen Gauze, MD;  Location: [facility];  Service: Urology;  Laterality: Left;   KNEE ARTHROSCOPY  02/25/2012   Procedure: ARTHROSCOPY KNEE;  Surgeon: Darreld Mclean, MD;  Location: [facility];  Service: Orthopedics;  Laterality: Right;   KNEE ARTHROSCOPY WITH MEDIAL MENISECTOMY Left 12/23/2014    Procedure: LEFT KNEE ARTHROSCOPY WITH PARTIAL MENISECTOMY;  Surgeon: Darreld Mclean, MD;  Location: [facility];  Service: Orthopedics;  Laterality: Left;   POLYPECTOMY  10/25/2016   Procedure: POLYPECTOMY;  Surgeon: Malissa Hippo, MD;  Location: [facility];  Service: Endoscopy;;  colon   SHOULDER ARTHROSCOPY  2004   left shoulder   SHOULDER ARTHROSCOPY  2008   right shoulder    Social History   Socioeconomic History   Marital status: Widowed    Spouse name: Not on file   Number of children: Not on file   Years of education: Not on file   Highest education level: Not on file  Occupational History   Not on file  Tobacco Use   Smoking status: Every Day    Current packs/day: 1.00    Average packs/day: 1 pack/day for 35.0 years (35.0 ttl pk-yrs)    Types: Cigarettes    Passive exposure: Current   Smokeless tobacco: Never  Vaping Use   Vaping status: Never Used  Substance and Sexual Activity   Alcohol use: No   Drug use: No   Sexual activity: Yes  Birth control/protection: None  Other Topics Concern   Not on file  Social History Narrative   Disabled   Right handed   Social Determinants of Health   Financial Resource Strain: Not on file  Food Insecurity: Not on file  Transportation Needs: Not on file  Physical Activity: Not on file  Stress: Not on file  Social Connections: Not on file    Family History  Problem Relation Age of Onset   Hypertension Father    Diabetes Father    Heart disease Father    Parkinson's disease Father    Hypertension Sister    Heart disease Brother        both brothers have heart disease   Heart disease Maternal Grandfather    Lupus Daughter    Neurofibromatosis Son    Parkinson's disease Paternal Aunt    Parkinson's disease Paternal Uncle    Heart attack Other     Outpatient Encounter Medications as of 03/03/2023  Medication Sig   acetaminophen (TYLENOL) 500 MG tablet Take 1,000 mg by mouth every 6 (six) hours as needed for  moderate pain.   AIMOVIG 70 MG/ML SOAJ Inject 70 mg into the skin every 30 (thirty) days.   albuterol (VENTOLIN HFA) 108 (90 Base) MCG/ACT inhaler inhale TWO puffs by MOUTH every SIX hours IN THE LEFT EAR needed FOR shortness of breath   ALPRAZolam (XANAX) 0.25 MG tablet Take 0.5 mg by mouth 3 (three) times daily.   aspirin EC 325 MG tablet Take 325 mg by mouth at bedtime.   atorvastatin (LIPITOR) 20 MG tablet TAKE ONE TABLET BY MOUTH EVERY DAY   BD VEO INSULIN SYRINGE U/F 31G X 15/64" 1 ML MISC AS DIRECTED   Blood Glucose Monitoring Suppl w/Device KIT Test glucose 4 times daily. E11.9   buPROPion (WELLBUTRIN SR) 150 MG 12 hr tablet TAKE ONE TABLET BY MOUTH TWICE DAILY   cyclobenzaprine (FLEXERIL) 5 MG tablet Take 1 tablet (5 mg total) by mouth 3 (three) times daily as needed for muscle spasms.   fluticasone-salmeterol (ADVAIR) 250-50 MCG/ACT AEPB inhale ONE PUFF by MOUTH every 12 hours. <<rinse MOUTH AFTER use>>   glucose blood (TRUE METRIX BLOOD GLUCOSE TEST) test strip check blood sugar FOUR TIMES DAILY   hydrOXYzine (ATARAX/VISTARIL) 50 MG tablet Take 50 mg by mouth daily as needed for anxiety.   insulin aspart (NOVOLOG) 100 UNIT/ML injection Inject 14-20 Units into the skin 3 (three) times daily with meals.   levETIRAcetam (KEPPRA) 500 MG tablet TAKE 3 TABLETS BY MOUTH TWICE DAILY.   Mesalamine (ASACOL) 400 MG CPDR DR capsule TAKE ONE CAPSULE BY MOUTH FOUR TIMES DAILY   morphine (MS CONTIN) 30 MG 12 hr tablet Take 1 tablet (30 mg total) by mouth 2 (two) times daily. May fill 60 days after 09/30/2017   Multiple Vitamins-Minerals (CENTRUM PO) Take 1 tablet by mouth daily.   ondansetron (ZOFRAN-ODT) 8 MG disintegrating tablet Take 1 tablet (8 mg total) by mouth every 8 (eight) hours as needed for nausea or vomiting.   Oxycodone HCl 10 MG TABS Take 1 tablet by mouth 4 times a day as needed for pain. Do not drive or operate machinery while on this medicine. (Patient taking differently: Take 10 mg  by mouth 4 (four) times daily as needed (pain). Do not drive or operate machinery while on this medicine.)   polyethylene glycol (MIRALAX) 17 g packet Take 17 g by mouth daily.   QUEtiapine (SEROQUEL) 100 MG tablet Take 100 mg by  mouth at bedtime.   sodium bicarbonate 650 MG tablet TAKE 1 TABLET BY MOUTH TWICE DAILY   tamsulosin (FLOMAX) 0.4 MG CAPS capsule Take 1 capsule (0.4 mg total) by mouth 2 (two) times daily.   traZODone (DESYREL) 100 MG tablet Take 100 mg by mouth at bedtime.   TRUEPLUS INSULIN SYRINGE 31G X 5/16" 1 ML MISC AS DIRECTED   venlafaxine XR (EFFEXOR-XR) 37.5 MG 24 hr capsule Take 1 capsule (37.5 mg total) by mouth daily.   vitamin C (ASCORBIC ACID) 500 MG tablet Take 500 mg by mouth daily.   [DISCONTINUED] insulin degludec (TRESIBA FLEXTOUCH) 200 UNIT/ML FlexTouch Pen Inject 80 Units into the skin at bedtime.   insulin degludec (TRESIBA FLEXTOUCH) 200 UNIT/ML FlexTouch Pen Inject 70 Units into the skin at bedtime.   No facility-administered encounter medications on file as of 03/03/2023.    ALLERGIES: Allergies  Allergen Reactions   Gabapentin Other (See Comments)    Increased blood sugar    VACCINATION STATUS: Immunization History  Administered Date(s) Administered   Influenza Split 04/01/2013   Influenza,inj,Quad PF,6+ Mos 03/30/2014, 03/22/2015, 03/21/2016, 04/07/2017, 03/30/2018, 07/02/2021, 07/03/2022   Pneumococcal Polysaccharide-23 07/02/2018   Pneumococcal-Unspecified 06/26/2004    Diabetes He presents for his follow-up diabetic visit. He has type 2 diabetes mellitus. Onset time: diagnosed at approx age of 56. His disease course has been improving. Hypoglycemia symptoms include nervousness/anxiousness, sweats and tremors. Associated symptoms include blurred vision and fatigue. There are no hypoglycemic complications. Symptoms are stable. Diabetic complications include a CVA, heart disease, nephropathy and peripheral neuropathy. Risk factors for coronary  artery disease include tobacco exposure, sedentary lifestyle, male sex, obesity, hypertension, diabetes mellitus, dyslipidemia and family history. Current diabetic treatment includes intensive insulin program. He is compliant with treatment most of the time. His weight is increasing rapidly. He is following a generally unhealthy diet. When asked about meal planning, he reported none. He has not had a previous visit with a dietitian. He rarely participates in exercise. His home blood glucose trend is decreasing steadily. His overall blood glucose range is 140-180 mg/dl. (He presents today with his meter and logs showing improving glycemic profile.  His POCT A1c today is 6.4%, improving from last visit of 9%.  He still has random hypoglycemia but cannot recall the possible reasons as to why.  Analysis of his meter shows 7-day average of 171, 14-day average of 157, 30-day average of 152.  He continues to have problems urinating and having regular bowel movements.  He has follow up with his specialists next week.) An ACE inhibitor/angiotensin II receptor blocker is being taken. He does not see a podiatrist (saw in distant past).Eye exam is not current.     Review of systems  Constitutional: + increasing body weight (has been off diuretics due to kidney function),  current Body mass index is 38.44 kg/m. , no fatigue, no subjective hyperthermia, no subjective hypothermia Eyes: no blurry vision, no xerophthalmia ENT: no sore throat, no nodules palpated in throat, no dysphagia/odynophagia, no hoarseness Cardiovascular: no chest pain, no shortness of breath, no palpitations, + leg swelling Respiratory: no cough, no shortness of breath Gastrointestinal: no nausea/vomiting/diarrhea, + constipation Musculoskeletal: no muscle/joint aches Skin: no rashes, no hyperemia Neurological: no tremors, no numbness, no tingling, no dizziness Psychiatric: no depression, no anxiety  Objective:     BP 117/71 (BP Location:  Left Arm, Patient Position: Sitting, Cuff Size: Large)   Pulse 97   Ht 5\' 7"  (1.702 m)   Wt 245 lb 6.4  oz (111.3 kg)   BMI 38.44 kg/m   Wt Readings from Last 3 Encounters:  03/03/23 245 lb 6.4 oz (111.3 kg)  01/27/23 225 lb 3.2 oz (102.2 kg)  01/02/23 233 lb (105.7 kg)     BP Readings from Last 3 Encounters:  03/03/23 117/71  01/29/23 (!) 140/88  01/27/23 105/73     Physical Exam- Limited  Constitutional:  Body mass index is 38.44 kg/m. , not in acute distress, normal state of mind Eyes:  EOMI, no exophthalmos Musculoskeletal: no gross deformities, strength intact in all four extremities, no gross restriction of joint movements Skin:  no rashes, + hyperemia to BLE, nicotinic discoloration to bilateral fingertips Neurological: no tremor with outstretched hands   Diabetic Foot Exam - Simple   No data filed     CMP ( most recent) CMP     Component Value Date/Time   NA 141 01/13/2023 0814   K 5.7 (H) 01/13/2023 0814   CL 102 01/13/2023 0814   CO2 24 01/13/2023 0814   GLUCOSE 79 01/13/2023 0814   GLUCOSE 110 (H) 11/15/2022 0636   BUN 57 (H) 01/13/2023 0814   CREATININE 2.89 (H) 01/13/2023 0814   CREATININE 0.78 04/01/2013 1046   CALCIUM 9.4 01/13/2023 0814   PROT 7.3 11/15/2022 0636   PROT 6.4 10/01/2021 1004   ALBUMIN 4.4 11/15/2022 0636   ALBUMIN 4.6 10/01/2021 1004   AST 20 11/15/2022 0636   ALT 21 11/15/2022 0636   ALKPHOS 82 11/15/2022 0636   BILITOT 0.6 11/15/2022 0636   BILITOT <0.2 10/01/2021 1004   GFRNONAA 50 (L) 11/15/2022 0636   GFRAA 98 03/28/2020 0939     Diabetic Labs (most recent): Lab Results  Component Value Date   HGBA1C 6.4 (A) 03/03/2023   HGBA1C 9.0 (H) 10/02/2022   HGBA1C 7.7 (H) 07/03/2022     Lipid Panel ( most recent) Lipid Panel     Component Value Date/Time   CHOL 155 10/02/2022 1139   CHOL 142 01/01/2022 1157   TRIG 340 (H) 10/02/2022 1139   HDL 35 (L) 10/02/2022 1139   HDL 45 01/01/2022 1157   CHOLHDL 4.4  10/02/2022 1139   VLDL 68 (H) 10/02/2022 1139   LDLCALC 52 10/02/2022 1139   LDLCALC 75 01/01/2022 1157   LABVLDL 22 01/01/2022 1157      Lab Results  Component Value Date   TSH 1.57 11/19/2022           Assessment & Plan:   1) Type 2 diabetes mellitus with hyperglycemia, with long-term current use of insulin  He presents today with his meter and logs showing improving glycemic profile.  His POCT A1c today is 6.4%, improving from last visit of 9%.  He still has random hypoglycemia but cannot recall the possible reasons as to why.  Analysis of his meter shows 7-day average of 171, 14-day average of 157, 30-day average of 152.  He continues to have problems urinating and having regular bowel movements.  He has follow up with his specialists next week.  - STARLING SCHUMACKER has currently uncontrolled symptomatic type 2 DM since 57 years of age.   -Recent labs reviewed.  - I had a long discussion with him about the progressive nature of diabetes and the pathology behind its complications. -his diabetes is complicated by CKD stage 3a, ?CVA, tobacco abuse and he remains at a high risk for more acute and chronic complications which include CAD, CVA, CKD, retinopathy, and neuropathy. These are all discussed  in detail with him.  The following Lifestyle Medicine recommendations according to American College of Lifestyle Medicine [facility]) were discussed and offered to patient and he agrees to start the journey:  A. Whole Foods, Plant-based plate comprising of fruits and vegetables, plant-based proteins, whole-grain carbohydrates was discussed in detail with the patient.   A list for source of those nutrients were also provided to the patient.  Patient will use only water or unsweetened tea for hydration. B.  The need to stay away from risky substances including alcohol, smoking; obtaining 7 to 9 hours of restorative sleep, at least 150 minutes of moderate intensity exercise weekly, the importance of  healthy social connections,  and stress reduction techniques were discussed. C.  A full color page of  Calorie density of various food groups per pound showing examples of each food groups was provided to the patient.  - Nutritional counseling repeated at each appointment due to patients tendency to fall back in to old habits.  - The patient admits there is a room for improvement in their diet and drink choices. -  Suggestion is made for the patient to avoid simple carbohydrates from their diet including Cakes, Sweet Desserts / Pastries, Ice Cream, Soda (diet and regular), Sweet Tea, Candies, Chips, Cookies, Sweet Pastries, Store Bought Juices, Alcohol in Excess of 1-2 drinks a day, Artificial Sweeteners, Coffee Creamer, and "Sugar-free" Products. This will help patient to have stable blood glucose profile and potentially avoid unintended weight gain.   - I encouraged the patient to switch to unprocessed or minimally processed complex starch and increased protein intake (animal or plant source), fruits, and vegetables.   - Patient is advised to stick to a routine mealtimes to eat 3 meals a day and avoid unnecessary snacks (to snack only to correct hypoglycemia).  - I have approached him with the following individualized plan to manage his diabetes and patient agrees:   -He is advised to lower his Tresiba to 70 units SQ nightly and continue his Novolog to 14-20 units TID with meals if glucose is above 90 and he is eating (Specific instructions on how to titrate insulin dosage based on glucose readings given to patient in writing). He is advised to stay off the Ozempic due to severe constipation he had previously while taking it (he is also heavy smoker which increases his risk of pancreatitis on the GLP1 products).  -he is encouraged to continue monitoring glucose 4 times daily, before meals and before bed, and to call the clinic if he has readings less than 70 or above 300 for 3 tests in a row.  We  did talk about possibility of adding CGM in future, he is not interested at this time.  - he is warned not to take insulin without proper monitoring per orders. - Adjustment parameters are given to him for hypo and hyperglycemia in writing.  - Specific targets for  A1c; LDL, HDL, and Triglycerides were discussed with the patient.  2) Blood Pressure /Hypertension:  his blood pressure is controlled to target, tight today.   he is advised to continue his current medications including Lasix 20 mg p.o. daily with breakfast, Enalapril 20 mg po daily.  3) Lipids/Hyperlipidemia:    Review of his recent lipid panel from 10/03/22 showed controlled LDL at 52 and significantly elevated triglycerides of 340 .  he is advised to continue Lipitor 20 mg daily at bedtime.  Side effects and precautions discussed with him.  We also discussed importance of low  fat diet today.  4)  Weight/Diet:  his Body mass index is 38.44 kg/m.  -  clearly complicating his diabetes care.   he is a candidate for weight loss. I discussed with him the fact that loss of 5 - 10% of his  current body weight will have the most impact on his diabetes management.  Exercise, and detailed carbohydrates information provided  -  detailed on discharge instructions.  5) Chronic Care/Health Maintenance: -he is on ACEI/ARB and Statin medications and is encouraged to initiate and continue to follow up with Ophthalmology, Dentist, Podiatrist at least yearly or according to recommendations, and advised to QUIT SMOKING. I have recommended yearly flu vaccine and pneumonia vaccine at least every 5 years; moderate intensity exercise for up to 150 minutes weekly; and sleep for at least 7 hours a day.  - he is advised to maintain close follow up with Tommie Sams, DO for primary care needs, as well as his other providers for optimal and coordinated care.     I spent  41  minutes in the care of the patient today including review of labs from CMP, Lipids,  Thyroid Function, Hematology (current and previous including abstractions from other facilities); face-to-face time discussing  his blood glucose readings/logs, discussing hypoglycemia and hyperglycemia episodes and symptoms, medications doses, his options of short and long term treatment based on the latest standards of care / guidelines;  discussion about incorporating lifestyle medicine;  and documenting the encounter. Risk reduction counseling performed per USPSTF guidelines to reduce obesity and cardiovascular risk factors.     Please refer to Patient Instructions for Blood Glucose Monitoring and Insulin/Medications Dosing Guide"  in media tab for additional information. Please  also refer to " Patient Self Inventory" in the Media  tab for reviewed elements of pertinent patient history.  [name] participated in the discussions, expressed understanding, and voiced agreement with the above plans.  All questions were answered to his satisfaction. he is encouraged to contact clinic should he have any questions or concerns prior to his return visit.     Follow up plan: - Return in about 4 months (around 07/03/2023) for Diabetes F/U with A1c in office, No previsit labs, Bring meter and logs.   Ronny Bacon, [facility] [address] [place], [place] [address] Phone: [phone] Fax: [phone]  03/03/2023, 8:41 AM

## 2023-03-06 ENCOUNTER — Other Ambulatory Visit: Payer: Self-pay | Admitting: Urology

## 2023-03-06 ENCOUNTER — Other Ambulatory Visit: Payer: Self-pay | Admitting: Family Medicine

## 2023-03-14 ENCOUNTER — Ambulatory Visit (INDEPENDENT_AMBULATORY_CARE_PROVIDER_SITE_OTHER): Payer: Medicare Other

## 2023-03-14 ENCOUNTER — Other Ambulatory Visit: Payer: Self-pay | Admitting: Family Medicine

## 2023-03-14 VITALS — Ht 67.0 in | Wt 230.0 lb

## 2023-03-14 DIAGNOSIS — Z0000 Encounter for general adult medical examination without abnormal findings: Secondary | ICD-10-CM | POA: Diagnosis not present

## 2023-03-14 DIAGNOSIS — E1122 Type 2 diabetes mellitus with diabetic chronic kidney disease: Secondary | ICD-10-CM

## 2023-03-14 DIAGNOSIS — Z0100 Encounter for examination of eyes and vision without abnormal findings: Secondary | ICD-10-CM

## 2023-03-14 NOTE — Progress Notes (Signed)
 Because this visit was a virtual/telehealth visit,  certain criteria was not obtained, such a blood pressure, CBG if patient is a diabetic, and timed get up and go. Any medications not marked as "taking" was not mentioned during the medication reconciliation part of the visit. Any vitals not documented were not able to be obtained due to this being a telehealth visit. Vitals that have been documented are verbally provided by the patient.  Patient was unable to self-report a recent blood pressure reading due to a lack of equipment at home via telehealth.  Subjective:   Shaun Harris is a 57 y.o. male who presents for Medicare Annual/Subsequent preventive examination.  Visit Complete: Virtual  I connected with  Shaun Harris on 03/14/23 by a audio enabled telemedicine application and verified that I am speaking with the correct person using two identifiers.  Patient Location: Home  Provider Location: Home Office  I discussed the limitations of evaluation and management by telemedicine. The patient expressed understanding and agreed to proceed.  Patient Medicare AWV questionnaire was completed by the patient on na; I have confirmed that all information answered by patient is correct and no changes since this date.  Review of Systems     Cardiac Risk Factors include: advanced age (>1men, >61 women);diabetes mellitus;dyslipidemia;hypertension;smoking/ tobacco exposure;sedentary lifestyle;obesity (BMI >30kg/m2);male gender     Objective:    Today's Vitals   03/14/23 1305  Weight: 230 lb (104.3 kg)  Height: 5\' 7"  (1.702 m)  PainSc: 7    Body mass index is 36.02 kg/m.     03/14/2023    1:10 PM 11/19/2022    9:25 AM 11/15/2022    5:46 AM 08/03/2020   10:18 AM 07/21/2020    8:53 PM 10/21/2016   12:58 PM 08/27/2016   10:36 AM  Advanced Directives  Does Patient Have a Medical Advance Directive? No No No No No No No  Would patient like information on creating a medical advance directive? No -  Patient declined  No - Patient declined No - Patient declined  No - Patient declined No - Patient declined    Current Medications (verified) Outpatient Encounter Medications as of 03/14/2023  Medication Sig   AIMOVIG 70 MG/ML SOAJ Inject 70 mg into the skin every 30 (thirty) days.   albuterol (VENTOLIN HFA) 108 (90 Base) MCG/ACT inhaler inhale TWO puffs by MOUTH every SIX hours IN THE LEFT EAR needed FOR shortness of breath   ALPRAZolam (XANAX) 0.25 MG tablet Take 0.5 mg by mouth 3 (three) times daily.   aspirin EC 325 MG tablet Take 325 mg by mouth at bedtime.   atorvastatin (LIPITOR) 20 MG tablet TAKE ONE TABLET BY MOUTH EVERY DAY   BD VEO INSULIN SYRINGE U/F 31G X 15/64" 1 ML MISC AS DIRECTED   Blood Glucose Monitoring Suppl w/Device KIT Test glucose 4 times daily. E11.9   buPROPion (WELLBUTRIN SR) 150 MG 12 hr tablet TAKE ONE TABLET BY MOUTH TWICE DAILY   cyclobenzaprine (FLEXERIL) 5 MG tablet Take 1 tablet (5 mg total) by mouth 3 (three) times daily as needed for muscle spasms.   fluticasone-salmeterol (ADVAIR) 250-50 MCG/ACT AEPB inhale ONE PUFF by MOUTH every 12 hours. <<rinse MOUTH AFTER use>>   glucose blood (TRUE METRIX BLOOD GLUCOSE TEST) test strip check blood sugar FOUR TIMES DAILY   insulin aspart (NOVOLOG) 100 UNIT/ML injection Inject 14-20 Units into the skin 3 (three) times daily with meals.   levETIRAcetam (KEPPRA) 500 MG tablet TAKE THREE TABLETS BY  MOUTH TWICE DAILY   Mesalamine (ASACOL) 400 MG CPDR DR capsule TAKE ONE CAPSULE BY MOUTH FOUR TIMES DAILY   morphine (MS CONTIN) 30 MG 12 hr tablet Take 1 tablet (30 mg total) by mouth 2 (two) times daily. May fill 60 days after 09/30/2017   Multiple Vitamins-Minerals (CENTRUM PO) Take 1 tablet by mouth daily.   Oxycodone HCl 10 MG TABS Take 1 tablet by mouth 4 times a day as needed for pain. Do not drive or operate machinery while on this medicine. (Patient taking differently: Take 10 mg by mouth 4 (four) times daily as needed  (pain). Do not drive or operate machinery while on this medicine.)   QUEtiapine (SEROQUEL) 100 MG tablet Take 100 mg by mouth at bedtime.   tamsulosin (FLOMAX) 0.4 MG CAPS capsule Take 1 capsule (0.4 mg total) by mouth 2 (two) times daily.   traZODone (DESYREL) 100 MG tablet Take 100 mg by mouth at bedtime.   TRUEPLUS INSULIN SYRINGE 31G X 5/16" 1 ML MISC AS DIRECTED   venlafaxine XR (EFFEXOR-XR) 37.5 MG 24 hr capsule Take 1 capsule (37.5 mg total) by mouth daily.   acetaminophen (TYLENOL) 500 MG tablet Take 1,000 mg by mouth every 6 (six) hours as needed for moderate pain.   hydrOXYzine (ATARAX/VISTARIL) 50 MG tablet Take 50 mg by mouth daily as needed for anxiety.   insulin degludec (TRESIBA FLEXTOUCH) 200 UNIT/ML FlexTouch Pen Inject 70 Units into the skin at bedtime.   ondansetron (ZOFRAN-ODT) 8 MG disintegrating tablet Take 1 tablet (8 mg total) by mouth every 8 (eight) hours as needed for nausea or vomiting.   polyethylene glycol (MIRALAX) 17 g packet Take 17 g by mouth daily.   Potassium Citrate 15 MEQ (1620 MG) TBCR TAKE 1 TABLET BY MOUTH TWICE DAILY   sodium bicarbonate 650 MG tablet TAKE 1 TABLET BY MOUTH TWICE DAILY   vitamin C (ASCORBIC ACID) 500 MG tablet Take 500 mg by mouth daily.   No facility-administered encounter medications on file as of 03/14/2023.    Allergies (verified) Gabapentin   History: Past Medical History:  Diagnosis Date   Anxiety    Arthritis    Cerebral vasculitis    Chronic headaches    constant since possible stroke in 1997,"Vasculitis of brain with increased pressure of brain ".   COPD (chronic obstructive pulmonary disease) (HCC)    COPD (chronic obstructive pulmonary disease) (HCC)    Diabetes mellitus    Hyperlipidemia    Hypertension    Hypertriglyceridemia    Sleep apnea    has but doesnt use, cannot tolerate; PCP aware   Stroke [facility])    pt states,"they arent sure if i had a stroke in 1997, there is still a question about that with my  doctors". No deficits.   Tobacco abuse    Ulcerative colitis (HCC)    Ulcerative colitis (HCC)    Venous stasis    Past Surgical History:  Procedure Laterality Date   BIOPSY  10/25/2016   Procedure: BIOPSY;  Surgeon: Malissa Hippo, MD;  [facility];  Service: Endoscopy;;  colon   CERVICAL SPINE SURGERY  2008   x2-last surgery 04/24/2016.   COLONOSCOPY WITH PROPOFOL N/A 10/25/2016   Procedure: COLONOSCOPY WITH PROPOFOL;  Surgeon: Malissa Hippo, MD;  Location: [facility];  Service: Endoscopy;  Laterality: N/A;  7:30   EXTRACORPOREAL SHOCK WAVE LITHOTRIPSY Left 08/08/2020   Procedure: EXTRACORPOREAL SHOCK WAVE LITHOTRIPSY (ESWL);  Surgeon: Malen Gauze, MD;  Location: [facility];  Service: Urology;  Laterality: Left;   KNEE ARTHROSCOPY  02/25/2012   Procedure: ARTHROSCOPY KNEE;  Surgeon: Darreld Mclean, MD;  Location: [facility];  Service: Orthopedics;  Laterality: Right;   KNEE ARTHROSCOPY WITH MEDIAL MENISECTOMY Left 12/23/2014   Procedure: LEFT KNEE ARTHROSCOPY WITH PARTIAL MENISECTOMY;  Surgeon: Darreld Mclean, MD;  Location: [facility];  Service: Orthopedics;  Laterality: Left;   POLYPECTOMY  10/25/2016   Procedure: POLYPECTOMY;  Surgeon: Malissa Hippo, MD;  [facility];  Service: Endoscopy;;  colon   SHOULDER ARTHROSCOPY  2004   left shoulder   SHOULDER ARTHROSCOPY  2008   right shoulder   Family History  Problem Relation Age of Onset   Hypertension Father    Diabetes Father    Heart disease Father    Parkinson's disease Father    Hypertension Sister    Heart disease Brother        both brothers have heart disease   Heart disease Maternal Grandfather    Lupus Daughter    Neurofibromatosis Son    Parkinson's disease Paternal Aunt    Parkinson's disease Paternal Uncle    Heart attack Other    Social History   Socioeconomic History   Marital status: Widowed    Spouse name: Not on file   Number of children: Not on file   Years of education: Not on  file   Highest education level: Not on file  Occupational History   Not on file  Tobacco Use   Smoking status: Every Day    Current packs/day: 1.00    Average packs/day: 1 pack/day for 35.0 years (35.0 ttl pk-yrs)    Types: Cigarettes    Passive exposure: Current   Smokeless tobacco: Never  Vaping Use   Vaping status: Never Used  Substance and Sexual Activity   Alcohol use: No   Drug use: No   Sexual activity: Yes    Birth control/protection: None  Other Topics Concern   Not on file  Social History Narrative   Disabled   Right handed   Social Determinants of Health   Financial Resource Strain: Low Risk  (03/14/2023)   Overall Financial Resource Strain (CARDIA)    Difficulty of Paying Living Expenses: Not hard at all  Food Insecurity: No Food Insecurity (03/14/2023)   Hunger Vital Sign    Worried About Running Out of Food in the Last Year: Never true    Ran Out of Food in the Last Year: Never true  Transportation Needs: No Transportation Needs (03/14/2023)   PRAPARE - Administrator, Civil Service (Medical): No    Lack of Transportation (Non-Medical): No  Physical Activity: Insufficiently Active (03/14/2023)   Exercise Vital Sign    Days of Exercise per Week: 7 days    Minutes of Exercise per Session: 20 min  Stress: No Stress Concern Present (03/14/2023)   [employer] of Occupational Health - Occupational Stress Questionnaire    Feeling of Stress : Not at all  Social Connections: Moderately Isolated (03/14/2023)   Social Connection and Isolation Panel [NHANES]    Frequency of Communication with Friends and Family: More than three times a week    Frequency of Social Gatherings with Friends and Family: More than three times a week    Attends Religious Services: 1 to 4 times per year    Active Member of [employer] or Organizations: No    Attends Banker Meetings: Never    Marital  Status: Widowed    Tobacco Counseling Ready to quit:  Yes Counseling given: Yes   Clinical Intake:  Pre-visit preparation completed: Yes  Pain : 0-10 Pain Score: 7  Pain Type: Chronic pain Pain Location: Head Pain Orientation: Left, Posterior Pain Descriptors / Indicators: Constant, Aching, Throbbing, Shooting Pain Onset: More than a month ago Pain Frequency: Constant     BMI - recorded: 36.02 Nutritional Status: BMI > 30  Obese Diabetes: Yes CBG done?: No Did pt. bring in CBG monitor from home?: No  How often do you need to have someone help you when you read instructions, pamphlets, or other written materials from your doctor or pharmacy?: 1 - Never  Interpreter Needed?: No  Information entered by ::  Jalexia Lalli, CMA   Activities of Daily Living    03/14/2023    1:08 PM  In your present state of health, do you have any difficulty performing the following activities:  Hearing? 0  Vision? 1  Comment patient requests a referral to establish care with an eye doctor  Difficulty concentrating or making decisions? 0  Walking or climbing stairs? 1  Comment chronic knee pain. uses a walker as needed  Dressing or bathing? 0  Doing errands, shopping? 0  Preparing Food and eating ? N  Using the Toilet? N  In the past six months, have you accidently leaked urine? N  Do you have problems with loss of bowel control? N  Managing your Medications? N  Managing your Finances? N  Housekeeping or managing your Housekeeping? N    Patient Care Team: Tommie Sams, DO as PCP - General (Family Medicine) McKenzie, Mardene Celeste, MD as Consulting Physician (Urology) Randa Lynn, MD as Consulting Physician (Nephrology) Dani Gobble, NP as Nurse Practitioner (Nurse Practitioner) Raquel James, NP as Nurse Practitioner (Gastroenterology) Tat, Octaviano Batty, DO as Consulting Physician (Neurology) Annalee Genta, DO (Family Medicine)  Indicate any recent Medical Services you may have received from other than [facility] providers  in the past year (date may be approximate).     Assessment:   This is a routine wellness examination for [facility].  Hearing/Vision screen Hearing Screening - Comments:: Patient denies any hearing difficulties.   Vision Screening - Comments:: Referral placed for patient to establish with eye care provider.    Dietary issues and exercise activities discussed:     Goals Addressed             This Visit's Progress    Patient Stated       To have less headaches       Depression Screen    03/14/2023    1:12 PM 01/02/2023   10:10 AM 10/02/2022   10:13 AM 07/03/2022    9:41 AM 01/01/2022    9:43 AM 07/02/2021    8:39 AM 02/22/2021   11:20 AM  PHQ 2/9 Scores  PHQ - 2 Score 0 3 2 2  0 0 0  PHQ- 9 Score  8 7 4    0    Fall Risk    03/14/2023    1:11 PM 01/02/2023   10:10 AM 11/19/2022    9:25 AM 10/02/2022   10:13 AM 07/03/2022    9:42 AM  Fall Risk   Falls in the past year? 0 0 0 0 0  Number falls in past yr: 0 0 0 0 0  Injury with Fall? 0 0 0 0 0  Risk for fall due to : No Fall Risks No Fall  Risks  No Fall Risks   Follow up Falls prevention discussed Falls evaluation completed Falls evaluation completed Falls evaluation completed     MEDICARE RISK AT HOME: Medicare Risk at Home Any stairs in or around the home?: No If so, are there any without handrails?: No Home free of loose throw rugs in walkways, pet beds, electrical cords, etc?: Yes Adequate lighting in your home to reduce risk of falls?: Yes Life alert?: No Use of a cane, walker or w/c?: Yes Grab bars in the bathroom?: Yes Shower chair or bench in shower?: No Elevated toilet seat or a handicapped toilet?: No  TIMED UP AND GO:  Was the test performed?  No    Cognitive Function:        03/14/2023    1:12 PM  6CIT Screen  What Year? 0 points  What month? 0 points  What time? 0 points  Count back from 20 0 points  Months in reverse 0 points  Repeat phrase 0 points  Total Score 0 points     Immunizations Immunization History  Administered Date(s) Administered   Influenza Split 04/01/2013   Influenza,inj,Quad PF,6+ Mos 03/30/2014, 03/22/2015, 03/21/2016, 04/07/2017, 03/30/2018, 07/02/2021, 07/03/2022   Pneumococcal Polysaccharide-23 07/02/2018   Pneumococcal-Unspecified 06/26/2004    TDAP status: Due, Education has been provided regarding the importance of this vaccine. Advised may receive this vaccine at local pharmacy or Health Dept. Aware to provide a copy of the vaccination record if obtained from local pharmacy or Health Dept. Verbalized acceptance and understanding.  Flu Vaccine status: Due, Education has been provided regarding the importance of this vaccine. Advised may receive this vaccine at local pharmacy or Health Dept. Aware to provide a copy of the vaccination record if obtained from local pharmacy or Health Dept. Verbalized acceptance and understanding.  Pneumococcal vaccine status: NOT AGE APPROPRIATE FOR THIS PATIENT  Covid-19 vaccine status: Information provided on how to obtain vaccines.   Qualifies for Shingles Vaccine? Yes   Zostavax completed No   Shingrix Completed?: No.    Education has been provided regarding the importance of this vaccine. Patient has been advised to call insurance company to determine out of pocket expense if they have not yet received this vaccine. Advised may also receive vaccine at local pharmacy or Health Dept. Verbalized acceptance and understanding.  Screening Tests Health Maintenance  Topic Date Due   DTaP/Tdap/Td (1 - Tdap) Never done   Zoster Vaccines- Shingrix (1 of 2) Never done   OPHTHALMOLOGY EXAM  04/05/2017   Medicare Annual Wellness (AWV)  07/03/2019   Colonoscopy  10/25/2021   FOOT EXAM  01/02/2023   INFLUENZA VACCINE  02/13/2023   Lung Cancer Screening  05/29/2023   Diabetic kidney evaluation - Urine ACR  07/04/2023   HEMOGLOBIN A1C  09/03/2023   Diabetic kidney evaluation - eGFR measurement  01/13/2024    Hepatitis C Screening  Completed   HIV Screening  Completed   HPV VACCINES  Aged Out   COVID-19 Vaccine  Discontinued    Health Maintenance  Health Maintenance Due  Topic Date Due   DTaP/Tdap/Td (1 - Tdap) Never done   Zoster Vaccines- Shingrix (1 of 2) Never done   OPHTHALMOLOGY EXAM  04/05/2017   Medicare Annual Wellness (AWV)  07/03/2019   Colonoscopy  10/25/2021   FOOT EXAM  01/02/2023   INFLUENZA VACCINE  02/13/2023    Colorectal Cancer Screening. Patient currently established with GI  Lung Cancer Screening: (Low Dose CT Chest recommended if Age 74-80  years, 20 pack-year currently smoking OR have quit w/in 15years.) does qualify.   Lung Cancer Screening Referral: Last Screening completed on 05/28/2022. Results visible via Care Everywhere  Additional Screening:  Hepatitis C Screening: does not qualify; Completed 04/04/2021  Vision Screening: Recommended annual ophthalmology exams for early detection of glaucoma and other disorders of the eye. Is the patient up to date with their annual eye exam?  No  Who is the provider or what is the name of the office in which the patient attends annual eye exams? na If pt is not established with a provider, would they like to be referred to a provider to establish care? Yes .   Dental Screening: Recommended annual dental exams for proper oral hygiene  Diabetic Foot Exam: Diabetic Foot Exam: Overdue, Pt has been advised about the importance in completing this exam. Pt is scheduled for diabetic foot exam on  .  Community Resource Referral / Chronic Care Management: CRR required this visit?  No   CCM required this visit?  No     Plan:     I have personally reviewed and noted the following in the patient's chart:   Medical and social history Use of alcohol, tobacco or illicit drugs  Current medications and supplements including opioid prescriptions. Patient is currently taking opioid prescriptions. Information provided to  patient regarding non-opioid alternatives. Patient advised to discuss non-opioid treatment plan with their provider. Functional ability and status Nutritional status Physical activity Advanced directives List of other physicians Hospitalizations, surgeries, and ER visits in previous 12 months Vitals Screenings to include cognitive, depression, and falls Referrals and appointments  In addition, I have reviewed and discussed with patient certain preventive protocols, quality metrics, and best practice recommendations. A written personalized care plan for preventive services as well as general preventive health recommendations were provided to patient.     Jordan Hawks [place], CMA   03/14/2023   After Visit Summary: (Mail) Due to this being a telephonic visit, the after visit summary with patients personalized plan was offered to patient via mail   Nurse Notes: Patient is due for a diabetic foot exam

## 2023-03-14 NOTE — Patient Instructions (Signed)
Shaun Harris , Thank you for taking time to come for your Medicare Wellness Visit. I appreciate your ongoing commitment to your health goals. Please review the following plan we discussed and let me know if I can assist you in the future.   Referrals/Orders/Follow-Ups/Clinician Recommendations:  You have been referred to see an eye doctor to have your yearly eye exam. If you haven't heard from them in the next 7 business days, please call their office to schedule your appointment.  Dr. Sinda Du [address] [place] [address] PHONE [phone]   This is a list of the screening recommended for you and due dates:  Health Maintenance  Topic Date Due   DTaP/Tdap/Td vaccine (1 - Tdap) Never done   Zoster (Shingles) Vaccine (1 of 2) Never done   Eye exam for diabetics  04/05/2017   Medicare Annual Wellness Visit  07/03/2019   Colon Cancer Screening  10/25/2021   Complete foot exam   01/02/2023   Flu Shot  02/13/2023   Screening for Lung Cancer  05/29/2023   Yearly kidney health urinalysis for diabetes  07/04/2023   Hemoglobin A1C  09/03/2023   Yearly kidney function blood test for diabetes  01/13/2024   Hepatitis C Screening  Completed   HIV Screening  Completed   HPV Vaccine  Aged Out   COVID-19 Vaccine  Discontinued    Advanced directives: (Declined) Advance directive discussed with you today. Even though you declined this today, please call our office should you change your mind, and we can give you the proper paperwork for you to fill out.  Next Medicare Annual Wellness Visit scheduled for next year: Yes  Preventive Care 16-19 Years Old, Male Preventive care refers to lifestyle choices and visits with your health care provider that can promote health and wellness. Preventive care visits are also called wellness exams. What can I expect for my preventive care visit? Counseling During your preventive care visit, your health care provider may ask about your: Medical history,  including: Past medical problems. Family medical history. Current health, including: Emotional well-being. Home life and relationship well-being. Sexual activity. Lifestyle, including: Alcohol, nicotine or tobacco, and drug use. Access to firearms. Diet, exercise, and sleep habits. Safety issues such as seatbelt and bike helmet use. Sunscreen use. Work and work Astronomer. Physical exam Your health care provider will check your: Height and weight. These may be used to calculate your BMI (body mass index). BMI is a measurement that tells if you are at a healthy weight. Waist circumference. This measures the distance around your waistline. This measurement also tells if you are at a healthy weight and may help predict your risk of certain diseases, such as type 2 diabetes and high blood pressure. Heart rate and blood pressure. Body temperature. Skin for abnormal spots. What immunizations do I need?  Vaccines are usually given at various ages, according to a schedule. Your health care provider will recommend vaccines for you based on your age, medical history, and lifestyle or other factors, such as travel or where you work. What tests do I need? Screening Your health care provider may recommend screening tests for certain conditions. This may include: Lipid and cholesterol levels. Diabetes screening. This is done by checking your blood sugar (glucose) after you have not eaten for a while (fasting). Hepatitis B test. Hepatitis C test. HIV (human immunodeficiency virus) test. STI (sexually transmitted infection) testing, if you are at risk. Lung cancer screening. Prostate cancer screening. Colorectal cancer  screening. Talk with your health care provider about your test results, treatment options, and if necessary, the need for more tests. Follow these instructions at home: Eating and drinking  Eat a diet that includes fresh fruits and vegetables, whole grains, lean protein, and  low-fat dairy products. Take vitamin and mineral supplements as recommended by your health care provider. Do not drink alcohol if your health care provider tells you not to drink. If you drink alcohol: Limit how much you have to 0-2 drinks a day. Know how much alcohol is in your drink. In the [place] one drink equals one 12 oz bottle of beer (355 mL), one 5 oz glass of wine (148 mL), or one 1 oz glass of hard liquor (44 mL). Lifestyle Brush your teeth every morning and night with fluoride toothpaste. Floss one time each day. Exercise for at least 30 minutes 5 or more days each week. Do not use any products that contain nicotine or tobacco. These products include cigarettes, chewing tobacco, and vaping devices, such as e-cigarettes. If you need help quitting, ask your health care provider. Do not use drugs. If you are sexually active, practice safe sex. Use a condom or other form of protection to prevent STIs. Take aspirin only as told by your health care provider. Make sure that you understand how much to take and what form to take. Work with your health care provider to find out whether it is safe and beneficial for you to take aspirin daily. Find healthy ways to manage stress, such as: Meditation, yoga, or listening to music. Journaling. Talking to a trusted person. Spending time with friends and family. Minimize exposure to UV radiation to reduce your risk of skin cancer. Safety Always wear your seat belt while driving or riding in a vehicle. Do not drive: If you have been drinking alcohol. Do not ride with someone who has been drinking. When you are tired or distracted. While texting. If you have been using any mind-altering substances or drugs. Wear a helmet and other protective equipment during sports activities. If you have firearms in your house, make sure you follow all gun safety procedures. What's next? Go to your health care provider once a year for an annual wellness  visit. Ask your health care provider how often you should have your eyes and teeth checked. Stay up to date on all vaccines. This information is not intended to replace advice given to you by your health care provider. Make sure you discuss any questions you have with your health care provider. Document Revised: 12/27/2020 Document Reviewed: 12/27/2020 Elsevier Patient Education  2024 [employer] Managing Pain Without Opioids Opioids are strong medicines used to treat moderate to severe pain. For some people, especially those who have long-term (chronic) pain, opioids may not be the best choice for pain management due to: Side effects like nausea, constipation, and sleepiness. The risk of addiction (opioid use disorder). The longer you take opioids, the greater your risk of addiction. Pain that lasts for more than 3 months is called chronic pain. Managing chronic pain usually requires more than one approach and is often provided by a team of health care providers working together (multidisciplinary approach). Pain management may be done at a pain management center or pain clinic. How to manage pain without the use of opioids Use non-opioid medicines Non-opioid medicines for pain may include: Over-the-counter or prescription non-steroidal anti-inflammatory drugs (NSAIDs). These may be the first medicines used for pain. They work well for muscle and  bone pain, and they reduce swelling. Acetaminophen. This over-the-counter medicine may work well for milder pain but not swelling. Antidepressants. These may be used to treat chronic pain. A certain type of antidepressant (tricyclics) is often used. These medicines are given in lower doses for pain than when used for depression. Anticonvulsants. These are usually used to treat seizures but may also reduce nerve (neuropathic) pain. Muscle relaxants. These relieve pain caused by sudden muscle tightening (spasms). You may also use a pain medicine that is  applied to the skin as a patch, cream, or gel (topical analgesic), such as a numbing medicine. These may cause fewer side effects than medicines taken by mouth. Do certain therapies as directed Some therapies can help with pain management. They include: Physical therapy. You will do exercises to gain strength and flexibility. A physical therapist may teach you exercises to move and stretch parts of your body that are weak, stiff, or painful. You can learn these exercises at physical therapy visits and practice them at home. Physical therapy may also involve: Massage. Heat wraps or applying heat or cold to affected areas. Electrical signals that interrupt pain signals (transcutaneous electrical nerve stimulation, TENS). Weak lasers that reduce pain and swelling (low-level laser therapy). Signals from your body that help you learn to regulate pain (biofeedback). Occupational therapy. This helps you to learn ways to function at home and work with less pain. Recreational therapy. This involves trying new activities or hobbies, such as a physical activity or drawing. Mental health therapy, including: Cognitive behavioral therapy (CBT). This helps you learn coping skills for dealing with pain. Acceptance and commitment therapy (ACT) to change the way you think and react to pain. Relaxation therapies, including muscle relaxation exercises and mindfulness-based stress reduction. Pain management counseling. This may be individual, family, or group counseling.  Receive medical treatments Medical treatments for pain management include: Nerve block injections. These may include a pain blocker and anti-inflammatory medicines. You may have injections: Near the spine to relieve chronic back or neck pain. Into joints to relieve back or joint pain. Into nerve areas that supply a painful area to relieve body pain. Into muscles (trigger point injections) to relieve some painful muscle conditions. A medical  device placed near your spine to help block pain signals and relieve nerve pain or chronic back pain (spinal cord stimulation device). Acupuncture. Follow these instructions at home Medicines Take over-the-counter and prescription medicines only as told by your health care provider. If you are taking pain medicine, ask your health care providers about possible side effects to watch out for. Do not drive or use heavy machinery while taking prescription opioid pain medicine. Lifestyle  Do not use drugs or alcohol to reduce pain. If you drink alcohol, limit how much you have to: 0-1 drink a day for women who are not pregnant. 0-2 drinks a day for men. Know how much alcohol is in a drink. In the [place] one drink equals one 12 oz bottle of beer (355 mL), one 5 oz glass of wine (148 mL), or one 1 oz glass of hard liquor (44 mL). Do not use any products that contain nicotine or tobacco. These products include cigarettes, chewing tobacco, and vaping devices, such as e-cigarettes. If you need help quitting, ask your health care provider. Eat a healthy diet and maintain a healthy weight. Poor diet and excess weight may make pain worse. Eat foods that are high in fiber. These include fresh fruits and vegetables, whole grains, and beans. Limit  foods that are high in fat and processed sugars, such as fried and sweet foods. Exercise regularly. Exercise lowers stress and may help relieve pain. Ask your health care provider what activities and exercises are safe for you. If your health care provider approves, join an exercise class that combines movement and stress reduction. Examples include yoga and tai chi. Get enough sleep. Lack of sleep may make pain worse. Lower stress as much as possible. Practice stress reduction techniques as told by your therapist. General instructions Work with all your pain management providers to find the treatments that work best for you. You are an important member of your pain  management team. There are many things you can do to reduce pain on your own. Consider joining an online or in-person support group for people who have chronic pain. Keep all follow-up visits. This is important. Where to find more information You can find more information about managing pain without opioids from: American Academy of Pain Medicine: painmed.org Institute for Chronic Pain: instituteforchronicpain.org American Chronic Pain Association: theacpa.org Contact a health care provider if: You have side effects from pain medicine. Your pain gets worse or does not get better with treatments or home therapy. You are struggling with anxiety or depression. Summary Many types of pain can be managed without opioids. Chronic pain may respond better to pain management without opioids. Pain is best managed when you and a team of health care providers work together. Pain management without opioids may include non-opioid medicines, medical treatments, physical therapy, mental health therapy, and lifestyle changes. Tell your health care providers if your pain gets worse or is not being managed well enough. This information is not intended to replace advice given to you by your health care provider. Make sure you discuss any questions you have with your health care provider. Document Revised: 10/11/2020 Document Reviewed: 10/11/2020 Elsevier Patient Education  2024 [employer]. Understanding Your Risk for Falls Millions of people have serious injuries from falls each year. It is important to understand your risk of falling. Talk with your health care provider about your risk and what you can do to lower it. If you do have a serious fall, make sure to tell your provider. Falling once raises your risk of falling again. How can falls affect me? Serious injuries from falls are common. These include: Broken bones, such as hip fractures. Head injuries, such as traumatic brain injuries (TBI) or  concussions. A fear of falling can cause you to avoid activities and stay at home. This can make your muscles weaker and raise your risk for a fall. What can increase my risk? There are a number of risk factors that increase your risk for falling. The more risk factors you have, the higher your risk of falling. Serious injuries from a fall happen most often to people who are older than 57 years old. Teenagers and young adults ages 29-29 are also at higher risk. Common risk factors include: Weakness in the lower body. Being generally weak or confused due to long-term (chronic) illness. Dizziness or balance problems. Poor vision. Medicines that cause dizziness or drowsiness. These may include: Medicines for your blood pressure, heart, anxiety, insomnia, or swelling (edema). Pain medicines. Muscle relaxants. Other risk factors include: Drinking alcohol. Having had a fall in the past. Having foot pain or wearing improper footwear. Working at a dangerous job. Having any of the following in your home: Tripping hazards, such as floor clutter or loose rugs. Poor lighting. Pets. Having dementia or  memory loss. What actions can I take to lower my risk of falling?     Physical activity Stay physically fit. Do strength and balance exercises. Consider taking a regular class to build strength and balance. Yoga and tai chi are good options. Vision Have your eyes checked every year and your prescription for glasses or contacts updated as needed. Shoes and walking aids Wear non-skid shoes. Wear shoes that have rubber soles and low heels. Do not wear high heels. Do not walk around the house in socks or slippers. Use a cane or walker as told by your provider. Home safety Attach secure railings on both sides of your stairs. Install grab bars for your bathtub, shower, and toilet. Use a non-skid mat in your bathtub or shower. Attach bath mats securely with double-sided, non-slip rug tape. Use good  lighting in all rooms. Keep a flashlight near your bed. Make sure there is a clear path from your bed to the bathroom. Use night-lights. Do not use throw rugs. Make sure all carpeting is taped or tacked down securely. Remove all clutter from walkways and stairways, including extension cords. Repair uneven or broken steps and floors. Avoid walking on icy or slippery surfaces. Walk on the grass instead of on icy or slick sidewalks. Use ice melter to get rid of ice on walkways in the winter. Use a cordless phone. Questions to ask your health care provider Can you help me check my risk for a fall? Do any of my medicines make me more likely to fall? Should I take a vitamin D supplement? What exercises can I do to improve my strength and balance? Should I make an appointment to have my vision checked? Do I need a bone density test to check for weak bones (osteoporosis)? Would it help to use a cane or a walker? Where to find more information Centers for Disease Control and Prevention, [employer]: TonerPromos.no Community-Based Fall Prevention Programs: TonerPromos.no [employer] on Aging: BaseRingTones.pl Contact a health care provider if: You fall at home. You are afraid of falling at home. You feel weak, drowsy, or dizzy. This information is not intended to replace advice given to you by your health care provider. Make sure you discuss any questions you have with your health care provider. Document Revised: 03/04/2022 Document Reviewed: 03/04/2022 Elsevier Patient Education  2024 [employer].

## 2023-03-24 ENCOUNTER — Ambulatory Visit: Payer: Medicare Other | Admitting: Urology

## 2023-03-26 ENCOUNTER — Ambulatory Visit (INDEPENDENT_AMBULATORY_CARE_PROVIDER_SITE_OTHER): Payer: Medicare Other | Admitting: Urology

## 2023-03-26 VITALS — BP 114/80 | HR 99

## 2023-03-26 DIAGNOSIS — N200 Calculus of kidney: Secondary | ICD-10-CM | POA: Diagnosis not present

## 2023-03-26 DIAGNOSIS — R339 Retention of urine, unspecified: Secondary | ICD-10-CM | POA: Diagnosis not present

## 2023-03-26 DIAGNOSIS — N390 Urinary tract infection, site not specified: Secondary | ICD-10-CM

## 2023-03-26 LAB — URINALYSIS, ROUTINE W REFLEX MICROSCOPIC
Bilirubin, UA: NEGATIVE
Glucose, UA: NEGATIVE
Ketones, UA: NEGATIVE
Nitrite, UA: NEGATIVE
RBC, UA: NEGATIVE
Specific Gravity, UA: 1.03 (ref 1.005–1.030)
Urobilinogen, Ur: 0.2 mg/dL (ref 0.2–1.0)
pH, UA: 6 (ref 5.0–7.5)

## 2023-03-26 LAB — BLADDER SCAN AMB NON-IMAGING: Scan Result: 0

## 2023-03-26 LAB — MICROSCOPIC EXAMINATION: WBC, UA: >30 /HPF — AB (ref 0–5)

## 2023-03-26 MED ORDER — TAMSULOSIN HCL 0.4 MG PO CAPS
0.4000 mg | ORAL_CAPSULE | Freq: Two times a day (BID) | ORAL | 3 refills | Status: DC
Start: 2023-03-26 — End: 2023-09-24

## 2023-03-26 NOTE — Progress Notes (Signed)
03/26/2023 2:07 PM   Shaun Harris [birth year] [id number]  Referring provider: Tommie Sams, DO [address] Sha[place],  [place] [address]  Followup BPH and urinary retention, and nephrolithiasis   HPI: [name] is a 57yo here for followup for BPH and nephrolithiasis. NO stone events since last visit. He has intermittent right flank pain that radiates down his right leg. He urinate frequently 1-2oz each time. IPSS 15 QOL 2. PVR 0cc.    PMH: Past Medical History:  Diagnosis Date   Anxiety    Arthritis    Cerebral vasculitis    Chronic headaches    constant since possible stroke in 1997,"Vasculitis of brain with increased pressure of brain ".   COPD (chronic obstructive pulmonary disease) (HCC)    COPD (chronic obstructive pulmonary disease) (HCC)    Diabetes mellitus    Hyperlipidemia    Hypertension    Hypertriglyceridemia    Sleep apnea    has but doesnt use, cannot tolerate; PCP aware   Stroke [facility])    pt states,"they arent sure if i had a stroke in 1997, there is still a question about that with my doctors". No deficits.   Tobacco abuse    Ulcerative colitis (HCC)    Ulcerative colitis (HCC)    Venous stasis     Surgical History: Past Surgical History:  Procedure Laterality Date   BIOPSY  10/25/2016   Procedure: BIOPSY;  Surgeon: Malissa Hippo, MD;  [facility];  Service: Endoscopy;;  colon   CERVICAL SPINE SURGERY  2008   x2-last surgery 04/24/2016.   COLONOSCOPY WITH PROPOFOL N/A 10/25/2016   Procedure: COLONOSCOPY WITH PROPOFOL;  Surgeon: Malissa Hippo, MD;  Location: [facility];  Service: Endoscopy;  Laterality: N/A;  7:30   EXTRACORPOREAL SHOCK WAVE LITHOTRIPSY Left 08/08/2020   Procedure: EXTRACORPOREAL SHOCK WAVE LITHOTRIPSY (ESWL);  Surgeon: Malen Gauze, MD;  Location: [facility];  Service: Urology;  Laterality: Left;   KNEE ARTHROSCOPY  02/25/2012   Procedure: ARTHROSCOPY KNEE;  Surgeon: Darreld Mclean, MD;  Location: [facility];   Service: Orthopedics;  Laterality: Right;   KNEE ARTHROSCOPY WITH MEDIAL MENISECTOMY Left 12/23/2014   Procedure: LEFT KNEE ARTHROSCOPY WITH PARTIAL MENISECTOMY;  Surgeon: Darreld Mclean, MD;  Location: [facility];  Service: Orthopedics;  Laterality: Left;   POLYPECTOMY  10/25/2016   Procedure: POLYPECTOMY;  Surgeon: Malissa Hippo, MD;  [facility];  Service: Endoscopy;;  colon   SHOULDER ARTHROSCOPY  2004   left shoulder   SHOULDER ARTHROSCOPY  2008   right shoulder    Home Medications:  Allergies as of 03/26/2023       Reactions   Gabapentin Other (See Comments)   Increased blood sugar        Medication List        Accurate as of March 26, 2023  2:07 PM. If you have any questions, ask your nurse or doctor.          acetaminophen 500 MG tablet Commonly known as: TYLENOL Take 1,000 mg by mouth every 6 (six) hours as needed for moderate pain.   Aimovig 70 MG/ML Soaj Generic drug: Erenumab-aooe Inject 70 mg into the skin every 30 (thirty) days.   albuterol 108 (90 Base) MCG/ACT inhaler Commonly known as: Ventolin HFA inhale TWO puffs by MOUTH every SIX hours IN THE LEFT EAR needed FOR shortness of breath   ALPRAZolam 0.25 MG tablet Commonly known as: XANAX Take 0.5 mg  by mouth 3 (three) times daily.   ascorbic acid 500 MG tablet Commonly known as: VITAMIN C Take 500 mg by mouth daily.   aspirin EC 325 MG tablet Take 325 mg by mouth at bedtime.   atorvastatin 20 MG tablet Commonly known as: LIPITOR TAKE ONE TABLET BY MOUTH EVERY DAY   Blood Glucose Monitoring Suppl w/Device Kit Test glucose 4 times daily. E11.9   buPROPion 150 MG 12 hr tablet Commonly known as: WELLBUTRIN SR TAKE ONE TABLET BY MOUTH TWICE DAILY   CENTRUM PO Take 1 tablet by mouth daily.   cyclobenzaprine 5 MG tablet Commonly known as: FLEXERIL Take 1 tablet (5 mg total) by mouth 3 (three) times daily as needed for muscle spasms.   fluticasone-salmeterol 250-50 MCG/ACT  Aepb Commonly known as: ADVAIR inhale ONE PUFF by MOUTH every 12 hours. <<rinse MOUTH AFTER use>>   hydrOXYzine 50 MG tablet Commonly known as: ATARAX Take 50 mg by mouth daily as needed for anxiety.   insulin aspart 100 UNIT/ML injection Commonly known as: NovoLOG Inject 14-20 Units into the skin 3 (three) times daily with meals.   levETIRAcetam 500 MG tablet Commonly known as: KEPPRA TAKE THREE TABLETS BY MOUTH TWICE DAILY   Mesalamine 400 MG Cpdr DR capsule Commonly known as: ASACOL TAKE ONE CAPSULE BY MOUTH FOUR TIMES DAILY   morphine 30 MG 12 hr tablet Commonly known as: MS CONTIN Take 1 tablet (30 mg total) by mouth 2 (two) times daily. May fill 60 days after 09/30/2017   ondansetron 8 MG disintegrating tablet Commonly known as: ZOFRAN-ODT Take 1 tablet (8 mg total) by mouth every 8 (eight) hours as needed for nausea or vomiting.   Oxycodone HCl 10 MG Tabs Take 1 tablet by mouth 4 times a day as needed for pain. Do not drive or operate machinery while on this medicine. What changed:  how much to take how to take this when to take this reasons to take this additional instructions   polyethylene glycol 17 g packet Commonly known as: MiraLax Take 17 g by mouth daily.   Potassium Citrate 15 MEQ (1620 MG) Tbcr TAKE 1 TABLET BY MOUTH TWICE DAILY   QUEtiapine 100 MG tablet Commonly known as: SEROQUEL Take 100 mg by mouth at bedtime.   sodium bicarbonate 650 MG tablet TAKE 1 TABLET BY MOUTH TWICE DAILY   tamsulosin 0.4 MG Caps capsule Commonly known as: FLOMAX Take 1 capsule (0.4 mg total) by mouth 2 (two) times daily.   traZODone 100 MG tablet Commonly known as: DESYREL Take 100 mg by mouth at bedtime.   Evaristo Bury FlexTouch 200 UNIT/ML FlexTouch Pen Generic drug: insulin degludec Inject 70 Units into the skin at bedtime.   True Metrix Blood Glucose Test test strip Generic drug: glucose blood check blood sugar FOUR TIMES DAILY   TRUEplus Insulin Syringe  31G X 5/16" 1 ML Misc Generic drug: Insulin Syringe-Needle U-100 AS DIRECTED   BD Veo Insulin Syringe U/F 31G X 15/64" 1 ML Misc Generic drug: Insulin Syringe-Needle U-100 AS DIRECTED   venlafaxine XR 37.5 MG 24 hr capsule Commonly known as: EFFEXOR-XR Take 1 capsule (37.5 mg total) by mouth daily.        Allergies:  Allergies  Allergen Reactions   Gabapentin Other (See Comments)    Increased blood sugar    Family History: Family History  Problem Relation Age of Onset   Hypertension Father    Diabetes Father    Heart disease Father  Parkinson's disease Father    Hypertension Sister    Heart disease Brother        both brothers have heart disease   Heart disease Maternal Grandfather    Lupus Daughter    Neurofibromatosis Son    Parkinson's disease Paternal Aunt    Parkinson's disease Paternal Uncle    Heart attack Other     Social History:  reports that he has been smoking cigarettes. He has a 35 pack-year smoking history. He has been exposed to tobacco smoke. He has never used smokeless tobacco. He reports that he does not drink alcohol and does not use drugs.  ROS: All other review of systems were reviewed and are negative except what is noted above in HPI  Physical Exam: BP 114/80   Pulse 99   Constitutional:  Alert and oriented, No acute distress. HEENT: [place] AT, moist mucus membranes.  Trachea midline, no masses. Cardiovascular: No clubbing, cyanosis, or edema. Respiratory: Normal respiratory effort, no increased work of breathing. GI: Abdomen is soft, nontender, nondistended, no abdominal masses GU: No CVA tenderness.  Lymph: No cervical or inguinal lymphadenopathy. Skin: No rashes, bruises or suspicious lesions. Neurologic: Grossly intact, no focal deficits, moving all 4 extremities. Psychiatric: Normal mood and affect.  Laboratory Data: Lab Results  Component Value Date   WBC 9.6 11/15/2022   HGB 14.1 11/15/2022   HCT 42.5 11/15/2022   MCV 93.0  11/15/2022   PLT 259 11/15/2022    Lab Results  Component Value Date   CREATININE 2.89 (H) 01/13/2023    No results found for: "PSA"  No results found for: "TESTOSTERONE"  Lab Results  Component Value Date   HGBA1C 6.4 (A) 03/03/2023    Urinalysis    Component Value Date/Time   COLORURINE YELLOW 11/15/2022 0802   APPEARANCEUR Turbid (A) 01/31/2023 1752   LABSPEC 1.019 11/15/2022 0802   PHURINE 6.0 11/15/2022 0802   GLUCOSEU Negative 01/31/2023 1752   HGBUR NEGATIVE 11/15/2022 0802   BILIRUBINUR Negative 01/31/2023 1752   KETONESUR NEGATIVE 11/15/2022 0802   PROTEINUR 1+ (A) 01/31/2023 1752   PROTEINUR NEGATIVE 11/15/2022 0802   UROBILINOGEN 0.2 12/21/2014 0845   NITRITE Negative 01/31/2023 1752   NITRITE NEGATIVE 11/15/2022 0802   LEUKOCYTESUR 3+ (A) 01/31/2023 1752   LEUKOCYTESUR NEGATIVE 11/15/2022 0802    Lab Results  Component Value Date   LABMICR See below: 01/31/2023   WBCUA >30 (A) 01/31/2023   LABEPIT 0-10 01/31/2023   MUCUS Present 07/25/2020   BACTERIA None seen 01/31/2023    Pertinent Imaging:  Results for orders placed in visit on 12/31/22  DG Abd 1 View  Narrative CLINICAL DATA:  Kidney stone.  EXAM: ABDOMEN - 1 VIEW  COMPARISON:  09/20/2022.  FINDINGS: The bowel gas pattern is normal. No radio-opaque calculi or other significant radiographic abnormality are seen.  IMPRESSION: Negative.   Electronically Signed By: Layla Maw M.D. On: 01/05/2023 18:16  No results found for this or any previous visit.  No results found for this or any previous visit.  No results found for this or any previous visit.  Results for orders placed during the hospital encounter of 12/18/20  Ultrasound renal complete  Narrative CLINICAL DATA:  Nephrolithiasis.  EXAM: RENAL / URINARY TRACT ULTRASOUND COMPLETE  COMPARISON:  September 14, 2020.  FINDINGS: Right Kidney:  Renal measurements: 11.9 x 6.0 x 6.3 cm = volume: 239 mL.  Probable 12 mm nonobstructive calculus seen in midpole collecting system. Echogenicity within normal limits. No mass  or hydronephrosis visualized.  Left Kidney:  Renal measurements: 13.4 x 5.7 x 5.6 cm = volume: 224 mL. Probable nonobstructive 7 mm calculus seen in midpole collecting system. Echogenicity within normal limits. No mass or hydronephrosis visualized.  Bladder:  Appears normal for degree of bladder distention. Bilateral ureteral jets are noted.  Other:  None.  IMPRESSION: Probable bilateral nonobstructive nephrolithiasis. No hydronephrosis or renal obstruction is noted.   Electronically Signed By: Lupita Raider M.D. On: 12/18/2020 13:39  No valid procedures specified. No results found for this or any previous visit.  Results for orders placed in visit on 09/11/22  CT RENAL STONE STUDY  Narrative CLINICAL DATA:  Bilateral flank pain, history of calculi  EXAM: CT ABDOMEN AND PELVIS WITHOUT CONTRAST  TECHNIQUE: Multidetector CT imaging of the abdomen and pelvis was performed following the standard protocol without IV contrast.  RADIATION DOSE REDUCTION: This exam was performed according to the departmental dose-optimization program which includes automated exposure control, adjustment of the mA and/or kV according to patient size and/or use of iterative reconstruction technique.  COMPARISON:  09/14/2020 and radiographs from 12/26/2021  FINDINGS: Lower chest: Unremarkable  Hepatobiliary: Unremarkable  Pancreas: Unremarkable  Spleen: Unremarkable  Adrenals/Urinary Tract: Stable 3.7 by 4.1 cm right adrenal myelolipoma, image 19 series 2. No signs of interval hematoma. This lesion is benign and does not require further imaging workup.  Cluster calculi in the right kidney lower pole measuring up to 1.1 cm in long axis. There is also a 2 mm right kidney upper pole nonobstructive renal calculus.  Two 2 mm left renal calculi are present.  No  hydronephrosis or hydroureter.  No ureteral or bladder calculus.  Stomach/Bowel: Unremarkable.  Normal appendix.  Vascular/Lymphatic: Atherosclerosis is present, including aortoiliac atherosclerotic disease.  Reproductive: Borderline prostatomegaly.  Other: No supplemental non-categorized findings.  Musculoskeletal: Mild lumbar spondylosis and degenerative disc disease.  IMPRESSION: 1. Bilateral nonobstructive nephrolithiasis. 2. Stable 4.1 cm right adrenal myelolipoma. This lesion is benign and does not require further imaging workup. 3. Borderline prostatomegaly. 4. Mild lumbar spondylosis and degenerative disc disease. 5. Aortic atherosclerosis.  Aortic Atherosclerosis (ICD10-I70.0).   Electronically Signed By: Gaylyn Rong M.D. On: 09/11/2022 15:04   Assessment & Plan:    1. Nephrolithiasis -Followup 6 months with renal US - Urinalysis, Routine w reflex microscopic  2. Urinary retention Flomax 0.4mg  BID - Urinalysis, Routine w reflex microscopic - BLADDER SCAN AMB NON-IMAGING   No follow-ups on file.  Wilkie Aye, MD  [facility] [place]

## 2023-03-26 NOTE — Progress Notes (Signed)
post void residual=0 ?

## 2023-03-28 LAB — URINE CULTURE

## 2023-04-01 ENCOUNTER — Encounter: Payer: Self-pay | Admitting: Urology

## 2023-04-01 NOTE — Progress Notes (Signed)
Letter sent.

## 2023-04-01 NOTE — Patient Instructions (Signed)

## 2023-04-04 ENCOUNTER — Ambulatory Visit (INDEPENDENT_AMBULATORY_CARE_PROVIDER_SITE_OTHER): Payer: Medicare Other | Admitting: Family Medicine

## 2023-04-04 VITALS — BP 132/87 | HR 96 | Temp 97.3°F | Ht 67.0 in | Wt 250.6 lb

## 2023-04-04 DIAGNOSIS — Z23 Encounter for immunization: Secondary | ICD-10-CM

## 2023-04-04 DIAGNOSIS — I10 Essential (primary) hypertension: Secondary | ICD-10-CM | POA: Diagnosis not present

## 2023-04-04 DIAGNOSIS — E785 Hyperlipidemia, unspecified: Secondary | ICD-10-CM

## 2023-04-04 DIAGNOSIS — J449 Chronic obstructive pulmonary disease, unspecified: Secondary | ICD-10-CM | POA: Diagnosis not present

## 2023-04-04 DIAGNOSIS — E1122 Type 2 diabetes mellitus with diabetic chronic kidney disease: Secondary | ICD-10-CM | POA: Diagnosis not present

## 2023-04-04 DIAGNOSIS — N183 Chronic kidney disease, stage 3 unspecified: Secondary | ICD-10-CM

## 2023-04-04 DIAGNOSIS — Z794 Long term (current) use of insulin: Secondary | ICD-10-CM

## 2023-04-04 NOTE — Patient Instructions (Signed)
Lab today.  I will reach out to [facility].  Follow up in 3 months.

## 2023-04-05 LAB — BASIC METABOLIC PANEL
BUN/Creatinine Ratio: 14 (ref 9–20)
BUN: 19 mg/dL (ref 6–24)
CO2: 25 mmol/L (ref 20–29)
Calcium: 9.3 mg/dL (ref 8.7–10.2)
Chloride: 103 mmol/L (ref 96–106)
Creatinine, Ser: 1.34 mg/dL — ABNORMAL HIGH (ref 0.76–1.27)
Glucose: 46 mg/dL — ABNORMAL LOW (ref 70–99)
Potassium: 4.8 mmol/L (ref 3.5–5.2)
Sodium: 143 mmol/L (ref 134–144)
eGFR: 62 mL/min/{1.73_m2} (ref >59)

## 2023-04-07 MED ORDER — FUROSEMIDE 20 MG PO TABS: 20.0000 mg | ORAL_TABLET | Freq: Every day | ORAL | 3 refills | Status: DC

## 2023-04-07 NOTE — Progress Notes (Signed)
Subjective:  Patient ID: Shaun Harris, male    DOB: [birth year]  Age: 57 y.o. MRN: [id number]  CC: Follow up   HPI:  57 year old male with an extensive past medical history presents for follow-up  Patient states that overall he is doing okay.  He is at his baseline.  Blood pressure is stable.  His antihypertensives were recently discontinued by nephrology after acute kidney injury.  Recommending metabolic panel today.  He states that he is having ongoing lower extremity edema.  Has ongoing shortness of breath as well.  Has known COPD.  He is concerned about the lower extremity edema.  He has areas of blistering as well.  Diabetes currently stable.  Last A1c 6.4 in August.  Patient Active Problem List   Diagnosis Date Noted   Nausea 01/03/2023   Thrombosed hemorrhoids 10/07/2022   Tremor 10/02/2022   Migraine 04/04/2022   BPH (benign prostatic hyperplasia) 01/01/2022   Ulcerative colitis (HCC) 07/02/2021   Type 2 diabetes mellitus with diabetic chronic kidney disease (HCC) 07/02/2021   History of seizure 03/14/2021   Stage 3 chronic kidney disease (HCC) 02/06/2021   Nephrolithiasis 07/25/2020   COPD (chronic obstructive pulmonary disease) (HCC) 03/10/2013   Tobacco abuse 03/10/2013   Essential hypertension 10/01/2012   Hyperlipidemia 10/01/2012   Chronic pain syndrome 10/01/2012    Social Hx   Social History   Socioeconomic History   Marital status: Widowed    Spouse name: Not on file   Number of children: Not on file   Years of education: Not on file   Highest education level: Not on file  Occupational History   Not on file  Tobacco Use   Smoking status: Every Day    Current packs/day: 1.00    Average packs/day: 1 pack/day for 35.0 years (35.0 ttl pk-yrs)    Types: Cigarettes    Passive exposure: Current   Smokeless tobacco: Never  Vaping Use   Vaping status: Never Used  Substance and Sexual Activity   Alcohol use: No   Drug use: No   Sexual activity: Yes     Birth control/protection: None  Other Topics Concern   Not on file  Social History Narrative   Disabled   Right handed   Social Determinants of Health   Financial Resource Strain: Low Risk  (03/14/2023)   Overall Financial Resource Strain (CARDIA)    Difficulty of Paying Living Expenses: Not hard at all  Food Insecurity: No Food Insecurity (03/14/2023)   Hunger Vital Sign    Worried About Running Out of Food in the Last Year: Never true    Ran Out of Food in the Last Year: Never true  Transportation Needs: No Transportation Needs (03/14/2023)   PRAPARE - Administrator, Civil Service (Medical): No    Lack of Transportation (Non-Medical): No  Physical Activity: Insufficiently Active (03/14/2023)   Exercise Vital Sign    Days of Exercise per Week: 7 days    Minutes of Exercise per Session: 20 min  Stress: No Stress Concern Present (03/14/2023)   [employer] of Occupational Health - Occupational Stress Questionnaire    Feeling of Stress : Not at all  Social Connections: Moderately Isolated (03/14/2023)   Social Connection and Isolation Panel [NHANES]    Frequency of Communication with Friends and Family: More than three times a week    Frequency of Social Gatherings with Friends and Family: More than three times a week    Attends Religious Services: 1  to 4 times per year    Active Member of Clubs or Organizations: No    Attends Banker Meetings: Never    Marital Status: Widowed    Review of Systems Per HPI  Objective:  BP 132/87   Pulse 96   Temp (!) 97.3 F (36.3 C) (Oral)   Ht 5\' 7"  (1.702 m)   Wt 250 lb 9.6 oz (113.7 kg)   SpO2 95%   BMI 39.25 kg/m      04/04/2023   11:20 AM 03/26/2023    1:35 PM 03/14/2023    1:05 PM  BP/Weight  Systolic BP 132 114 --  Diastolic BP 87 80 --  Wt. (Lbs) 250.6  230  BMI 39.25 kg/m2  36.02 kg/m2    Physical Exam Vitals and nursing note reviewed.  Constitutional:      Appearance: Normal appearance. He  is obese.  HENT:     Head: Normocephalic and atraumatic.  Cardiovascular:     Rate and Rhythm: Normal rate and regular rhythm.     Comments: 1-2+ pitting lower extremity edema. Pulmonary:     Effort: Pulmonary effort is normal.     Breath sounds: Normal breath sounds. No wheezing or rales.  Neurological:     Mental Status: He is alert.  Psychiatric:        Mood and Affect: Mood normal.        Behavior: Behavior normal.     Lab Results  Component Value Date   WBC 9.6 11/15/2022   HGB 14.1 11/15/2022   HCT 42.5 11/15/2022   PLT 259 11/15/2022   GLUCOSE 46 (L) 04/04/2023   CHOL 155 10/02/2022   TRIG 340 (H) 10/02/2022   HDL 35 (L) 10/02/2022   LDLCALC 52 10/02/2022   ALT 21 11/15/2022   AST 20 11/15/2022   NA 143 04/04/2023   K 4.8 04/04/2023   CL 103 04/04/2023   CREATININE 1.34 (H) 04/04/2023   BUN 19 04/04/2023   CO2 25 04/04/2023   TSH 1.57 11/19/2022   HGBA1C 6.4 (A) 03/03/2023     Assessment & Plan:   Problem List Items Addressed This Visit       Cardiovascular and Mediastinum   Essential hypertension - Primary    BP is stable.  However, patient is having worsening lower extremity edema.  I have reached out to nephrology.  I have spoken with his nephrologist.  Restarting Lasix.      Relevant Medications   furosemide (LASIX) 20 MG tablet   Other Relevant Orders   Basic Metabolic Panel (Completed)     Respiratory   COPD (chronic obstructive pulmonary disease) (HCC)    Stable currently.        Endocrine   Type 2 diabetes mellitus with diabetic chronic kidney disease (HCC)    At goal.  Continue insulin.        Other   Hyperlipidemia    LDL at goal.  Continue Lipitor.      Relevant Medications   furosemide (LASIX) 20 MG tablet   Other Visit Diagnoses     Needs flu shot       Relevant Orders   Flu vaccine trivalent PF, 6mos and older(Flulaval,Afluria,Fluarix,Fluzone) (Completed)       Meds ordered this encounter  Medications    furosemide (LASIX) 20 MG tablet    Sig: Take 1 tablet (20 mg total) by mouth daily.    Dispense:  30 tablet    Refill:  3  Follow-up:  Return in about 3 months (around 07/04/2023).  Everlene Other DO [facility]

## 2023-04-07 NOTE — Assessment & Plan Note (Signed)
LDL at goal. Continue Lipitor.

## 2023-04-07 NOTE — Assessment & Plan Note (Signed)
At goal.  Continue insulin.

## 2023-04-07 NOTE — Assessment & Plan Note (Signed)
Stable currently

## 2023-04-07 NOTE — Assessment & Plan Note (Signed)
BP is stable.  However, patient is having worsening lower extremity edema.  I have reached out to nephrology.  I have spoken with his nephrologist.  Restarting Lasix.

## 2023-04-08 ENCOUNTER — Other Ambulatory Visit: Payer: Self-pay | Admitting: Family Medicine

## 2023-04-08 ENCOUNTER — Other Ambulatory Visit: Payer: Self-pay

## 2023-04-08 DIAGNOSIS — I10 Essential (primary) hypertension: Secondary | ICD-10-CM

## 2023-04-08 DIAGNOSIS — J449 Chronic obstructive pulmonary disease, unspecified: Secondary | ICD-10-CM

## 2023-04-17 LAB — BASIC METABOLIC PANEL
BUN/Creatinine Ratio: 19 (ref 9–20)
BUN: 26 mg/dL — ABNORMAL HIGH (ref 6–24)
CO2: 25 mmol/L (ref 20–29)
Calcium: 10 mg/dL (ref 8.7–10.2)
Chloride: 103 mmol/L (ref 96–106)
Creatinine, Ser: 1.34 mg/dL — ABNORMAL HIGH (ref 0.76–1.27)
Glucose: 148 mg/dL — ABNORMAL HIGH (ref 70–99)
Potassium: 5.1 mmol/L (ref 3.5–5.2)
Sodium: 141 mmol/L (ref 134–144)
eGFR: 62 mL/min/{1.73_m2} (ref >59)

## 2023-05-01 ENCOUNTER — Other Ambulatory Visit: Payer: Self-pay | Admitting: Family Medicine

## 2023-05-01 ENCOUNTER — Ambulatory Visit: Payer: Medicare Other | Admitting: Family Medicine

## 2023-05-01 VITALS — BP 136/88 | HR 100 | Temp 97.8°F | Wt 254.2 lb

## 2023-05-01 DIAGNOSIS — H60501 Unspecified acute noninfective otitis externa, right ear: Secondary | ICD-10-CM

## 2023-05-01 DIAGNOSIS — Z794 Long term (current) use of insulin: Secondary | ICD-10-CM

## 2023-05-01 MED ORDER — CIPROFLOXACIN-DEXAMETHASONE 0.3-0.1 % OT SUSP: 4.0000 [drp] | Freq: Two times a day (BID) | OTIC | 0 refills | Status: AC

## 2023-05-01 NOTE — Progress Notes (Signed)
Subjective:  Patient ID: Shaun Harris, male    DOB: July 23, 1965  Age: 57 y.o. MRN: [id number]  CC:  Right ear   HPI:  58 year old male presents for evaluation of the above.  2-day history of right ear pain.  No other respiratory symptoms.  No relieving factors.  Patient reports a remote history of otitis externa.  Patient Active Problem List   Diagnosis Date Noted   Nausea 01/03/2023   Thrombosed hemorrhoids 10/07/2022   Tremor 10/02/2022   Migraine 04/04/2022   BPH (benign prostatic hyperplasia) 01/01/2022   Ulcerative colitis (HCC) 07/02/2021   Type 2 diabetes mellitus with diabetic chronic kidney disease (HCC) 07/02/2021   History of seizure 03/14/2021   Stage 3 chronic kidney disease (HCC) 02/06/2021   Nephrolithiasis 07/25/2020   COPD (chronic obstructive pulmonary disease) (HCC) 03/10/2013   Tobacco abuse 03/10/2013   Essential hypertension 10/01/2012   Hyperlipidemia 10/01/2012   Chronic pain syndrome 10/01/2012    Social Hx   Social History   Socioeconomic History   Marital status: Widowed    Spouse name: Not on file   Number of children: Not on file   Years of education: Not on file   Highest education level: Not on file  Occupational History   Not on file  Tobacco Use   Smoking status: Every Day    Current packs/day: 1.00    Average packs/day: 1 pack/day for 35.0 years (35.0 ttl pk-yrs)    Types: Cigarettes    Passive exposure: Current   Smokeless tobacco: Never  Vaping Use   Vaping status: Never Used  Substance and Sexual Activity   Alcohol use: No   Drug use: No   Sexual activity: Yes    Birth control/protection: None  Other Topics Concern   Not on file  Social History Narrative   Disabled   Right handed   Social Determinants of Health   Financial Resource Strain: Low Risk  (03/14/2023)   Overall Financial Resource Strain (CARDIA)    Difficulty of Paying Living Expenses: Not hard at all  Food Insecurity: No Food Insecurity (03/14/2023)    Hunger Vital Sign    Worried About Running Out of Food in the Last Year: Never true    Ran Out of Food in the Last Year: Never true  Transportation Needs: No Transportation Needs (03/14/2023)   PRAPARE - Administrator, Civil Service (Medical): No    Lack of Transportation (Non-Medical): No  Physical Activity: Insufficiently Active (03/14/2023)   Exercise Vital Sign    Days of Exercise per Week: 7 days    Minutes of Exercise per Session: 20 min  Stress: No Stress Concern Present (03/14/2023)   [employer] of Occupational Health - Occupational Stress Questionnaire    Feeling of Stress : Not at all  Social Connections: Moderately Isolated (03/14/2023)   Social Connection and Isolation Panel [NHANES]    Frequency of Communication with Friends and Family: More than three times a week    Frequency of Social Gatherings with Friends and Family: More than three times a week    Attends Religious Services: 1 to 4 times per year    Active Member of [employer] or Organizations: No    Attends Banker Meetings: Never    Marital Status: Widowed    Review of Systems Per HPI  Objective:  BP 136/88   Pulse 100   Temp 97.8 F (36.6 C) (Oral)   Wt 254 lb 3.2 oz (115.3 kg)  SpO2 96%   BMI 39.81 kg/m      05/01/2023   11:02 AM 04/04/2023   11:20 AM 03/26/2023    1:35 PM  BP/Weight  Systolic BP 136 132 114  Diastolic BP 88 87 80  Wt. (Lbs) 254.2 250.6   BMI 39.81 kg/m2 39.25 kg/m2     Physical Exam Vitals and nursing note reviewed.  Constitutional:      Appearance: Normal appearance. He is obese.  HENT:     Head: Normocephalic and atraumatic.     Ears:     Comments: Mild edema and mild debris in the right ear canal.  Normal TM. Neurological:     Mental Status: He is alert.     Lab Results  Component Value Date   WBC 9.6 11/15/2022   HGB 14.1 11/15/2022   HCT 42.5 11/15/2022   PLT 259 11/15/2022   GLUCOSE 148 (H) 04/16/2023   CHOL 155 10/02/2022    TRIG 340 (H) 10/02/2022   HDL 35 (L) 10/02/2022   LDLCALC 52 10/02/2022   ALT 21 11/15/2022   AST 20 11/15/2022   NA 141 04/16/2023   K 5.1 04/16/2023   CL 103 04/16/2023   CREATININE 1.34 (H) 04/16/2023   BUN 26 (H) 04/16/2023   CO2 25 04/16/2023   TSH 1.57 11/19/2022   HGBA1C 6.4 (A) 03/03/2023     Assessment & Plan:   Otitis externa Treating with Ciprodex.  Meds ordered this encounter  Medications   ciprofloxacin-dexamethasone (CIPRODEX) OTIC suspension    Sig: Place 4 drops into the right ear 2 (two) times daily for 7 days.    Dispense:  7.5 mL    Refill:  0   Schuyler Olden DO [facility]

## 2023-05-09 ENCOUNTER — Other Ambulatory Visit: Payer: Self-pay | Admitting: Family Medicine

## 2023-05-09 DIAGNOSIS — E785 Hyperlipidemia, unspecified: Secondary | ICD-10-CM

## 2023-05-11 ENCOUNTER — Other Ambulatory Visit: Payer: Self-pay | Admitting: Family Medicine

## 2023-05-11 DIAGNOSIS — J449 Chronic obstructive pulmonary disease, unspecified: Secondary | ICD-10-CM

## 2023-05-12 ENCOUNTER — Encounter (INDEPENDENT_AMBULATORY_CARE_PROVIDER_SITE_OTHER): Payer: Self-pay | Admitting: Gastroenterology

## 2023-05-12 ENCOUNTER — Ambulatory Visit (INDEPENDENT_AMBULATORY_CARE_PROVIDER_SITE_OTHER): Payer: Medicare Other | Admitting: Gastroenterology

## 2023-05-12 VITALS — BP 134/84 | HR 104 | Temp 97.7°F | Ht 67.0 in | Wt 257.0 lb

## 2023-05-12 DIAGNOSIS — K5190 Ulcerative colitis, unspecified, without complications: Secondary | ICD-10-CM

## 2023-05-12 DIAGNOSIS — Z79899 Other long term (current) drug therapy: Secondary | ICD-10-CM | POA: Insufficient documentation

## 2023-05-12 NOTE — Patient Instructions (Signed)
Continue with mesalamien (asacol) 1.6g per day We will update some basic labs You are overdue for colonoscopy, we will reach out closer to the end of the year to see if we can get you scheduled in January, per your request  Follow up 6 months  It was a pleasure to see you today. I want to create trusting relationships with patients and provide genuine, compassionate, and quality care. I truly value your feedback! please be on the lookout for a survey regarding your visit with me today. I appreciate your input about our visit and your time in completing this!    Bunyan Brier L. Jeanmarie Hubert, MSN, APRN, AGNP-C Adult-Gerontology Nurse Practitioner [facility]

## 2023-05-12 NOTE — Progress Notes (Addendum)
Referring Provider: Tommie Sams, DO Primary Care Physician:  Tommie Sams, DO Primary GI Physician: Dr. Levon Hedger   Chief Complaint  Patient presents with   Ulcerative Colitis    Follow up on UC. States doing well and no concerns.    HPI:   Shaun Harris is a 57 y.o. male with past medical history of history of UC, stroke, anxiety, COPD, urolithiasis, DM, HLD, HTN, OSA   Patient presenting today for follow up of UC  Last seen July 2024, at that time doing good from GI standpoint, no abdominal pain, having 1 BM daily.  Using MiraLAX and stool softener.  Reported recent nephrolithiasis, requiring Foley catheter placement.  Creatinine 2.89 just prior to his visit.  Patient recommended to continue mesalamine 1.6 g daily, continue MiraLAX and stool softener, reschedule colonoscopy pending renal function, need to discuss possibly discontinuing mesalamine with nephrologist given worsening renal function.  Last BMP 04/16/23 with BUn 26, Crea 1.34 Last CBC may 2024 was WNL   Present: States doing okay from GI standpoint. Having 3-4 BMs per day doing miralax and stool softeners. Denies abdominal pain, rectal bleeding or melena. He is open to scheduling colonoscopy earlier.   He notes still having workup for his kidneys. Seeing urology now. Having a renal US upcoming. He had a foley catheter in for 4-5 months that has since been removed. Renal function has improved but they are trying to figure out why he is developing so many kidney stones/having urinary retention.    Last Colonoscopy:09/2016 Exam performed to the cecum. No evidence of active colitis. Patchy edema at the sigmoid colon and rectum.  Biopsies taken. Diagnosis: colon ascending colon: focal active acute inflammation. Colon descending: Mild increase in chronic inflammation. Colon sigmoid and rectum: chronic changes changes but significant inflammation. Hyperplastic polyp.    Recommendations:    Past Medical History:   Diagnosis Date   Anxiety    Arthritis    Cerebral vasculitis    Chronic headaches    constant since possible stroke in 1997,"Vasculitis of brain with increased pressure of brain ".   COPD (chronic obstructive pulmonary disease) (HCC)    COPD (chronic obstructive pulmonary disease) (HCC)    Diabetes mellitus    Hyperlipidemia    Hypertension    Hypertriglyceridemia    Sleep apnea    has but doesnt use, cannot tolerate; PCP aware   Stroke [facility])    pt states,"they arent sure if i had a stroke in 1997, there is still a question about that with my doctors". No deficits.   Tobacco abuse    Ulcerative colitis (HCC)    Ulcerative colitis (HCC)    Venous stasis     Past Surgical History:  Procedure Laterality Date   BIOPSY  10/25/2016   Procedure: BIOPSY;  Surgeon: Malissa Hippo, MD;  [facility]: [facility];  Service: Endoscopy;;  colon   CERVICAL SPINE SURGERY  2008   x2-last surgery 04/24/2016.   COLONOSCOPY WITH PROPOFOL N/A 10/25/2016   Procedure: COLONOSCOPY WITH PROPOFOL;  Surgeon: Malissa Hippo, MD;  Location: [facility];  Service: Endoscopy;  Laterality: N/A;  7:30   EXTRACORPOREAL SHOCK WAVE LITHOTRIPSY Left 08/08/2020   Procedure: EXTRACORPOREAL SHOCK WAVE LITHOTRIPSY (ESWL);  Surgeon: Malen Gauze, MD;  Location: [facility] OR[facility];  Service: Urology;  Laterality: Left;   KNEE ARTHROSCOPY  02/25/2012   Procedure: ARTHROSCOPY KNEE;  Surgeon: Darreld Mclean, MD;  Location: [facility];  Service: Orthopedics;  Laterality: Right;  KNEE ARTHROSCOPY WITH MEDIAL MENISECTOMY Left 12/23/2014   Procedure: LEFT KNEE ARTHROSCOPY WITH PARTIAL MENISECTOMY;  Surgeon: Darreld Mclean, MD;  Location: [facility];  Service: Orthopedics;  Laterality: Left;   POLYPECTOMY  10/25/2016   Procedure: POLYPECTOMY;  Surgeon: Malissa Hippo, MD;  Location: [facility];  Service: Endoscopy;;  colon   SHOULDER ARTHROSCOPY  2004   left shoulder   SHOULDER ARTHROSCOPY  2008   right shoulder    Current Outpatient  Medications  Medication Sig Dispense Refill   acetaminophen (TYLENOL) 500 MG tablet Take 1,000 mg by mouth every 6 (six) hours as needed for moderate pain.     AIMOVIG 70 MG/ML SOAJ Inject 70 mg into the skin every 30 (thirty) days.     albuterol (VENTOLIN HFA) 108 (90 Base) MCG/ACT inhaler inhale TWO puffs by MOUTH every SIX hours IN THE LEFT EAR needed FOR shortness of breath 18 g 5   ALPRAZolam (XANAX) 0.25 MG tablet Take 0.5 mg by mouth 3 (three) times daily.     aspirin EC 325 MG tablet Take 325 mg by mouth at bedtime.     atorvastatin (LIPITOR) 20 MG tablet TAKE ONE TABLET BY MOUTH EVERY DAY 90 tablet 1   Blood Glucose Monitoring Suppl w/Device KIT Test glucose 4 times daily. E11.9 100 each 5   buPROPion (WELLBUTRIN SR) 150 MG 12 hr tablet TAKE ONE TABLET BY MOUTH TWICE DAILY 180 tablet 1   cyclobenzaprine (FLEXERIL) 5 MG tablet Take 1 tablet (5 mg total) by mouth 3 (three) times daily as needed for muscle spasms. 30 tablet 1   fluticasone-salmeterol (ADVAIR) 250-50 MCG/ACT AEPB inhale ONE PUFF by MOUTH every 12 hours. <<rinse MOUTH AFTER use>> 180 each 1   furosemide (LASIX) 20 MG tablet Take 1 tablet (20 mg total) by mouth daily. 30 tablet 3   glucose blood (TRUE METRIX BLOOD GLUCOSE TEST) test strip check blood sugar FOUR TIMES DAILY 100 each 0   hydrOXYzine (ATARAX/VISTARIL) 50 MG tablet Take 50 mg by mouth daily as needed for anxiety.     insulin aspart (NOVOLOG) 100 UNIT/ML injection Inject 14-20 Units into the skin 3 (three) times daily with meals. 30 mL 3   insulin degludec (TRESIBA FLEXTOUCH) 200 UNIT/ML FlexTouch Pen Inject 70 Units into the skin at bedtime. 45 mL 3   Insulin Syringe-Needle U-100 (BD VEO INSULIN SYRINGE U/F) 31G X 15/64" 1 ML MISC AS DIRECTED 100 each 1   levETIRAcetam (KEPPRA) 500 MG tablet TAKE THREE TABLETS BY MOUTH TWICE DAILY 540 tablet 1   Mesalamine (ASACOL) 400 MG CPDR DR capsule TAKE ONE CAPSULE BY MOUTH FOUR TIMES DAILY 360 capsule 1   morphine (MS  CONTIN) 30 MG 12 hr tablet Take 1 tablet (30 mg total) by mouth 2 (two) times daily. May fill 60 days after 09/30/2017 60 tablet 0   Multiple Vitamins-Minerals (CENTRUM PO) Take 1 tablet by mouth daily.     ondansetron (ZOFRAN-ODT) 8 MG disintegrating tablet Take 1 tablet (8 mg total) by mouth every 8 (eight) hours as needed for nausea or vomiting. 20 tablet 6   Oxycodone HCl 10 MG TABS Take 1 tablet by mouth 4 times a day as needed for pain. Do not drive or operate machinery while on this medicine. (Patient taking differently: Take 10 mg by mouth 4 (four) times daily as needed (pain). Do not drive or operate machinery while on this medicine.) 120 tablet 0   polyethylene glycol (MIRALAX) 17 g packet Take 17 g  by mouth daily. 14 each 0   QUEtiapine (SEROQUEL) 100 MG tablet Take 100 mg by mouth at bedtime.     sodium bicarbonate 650 MG tablet TAKE 1 TABLET BY MOUTH TWICE DAILY 60 tablet 11   tamsulosin (FLOMAX) 0.4 MG CAPS capsule Take 1 capsule (0.4 mg total) by mouth 2 (two) times daily. 180 capsule 3   traZODone (DESYREL) 100 MG tablet Take 100 mg by mouth at bedtime.     TRUEPLUS INSULIN SYRINGE 31G X 5/16" 1 ML MISC AS DIRECTED 100 each 0   venlafaxine XR (EFFEXOR-XR) 37.5 MG 24 hr capsule Take 1 capsule (37.5 mg total) by mouth daily. 90 capsule 3   vitamin C (ASCORBIC ACID) 500 MG tablet Take 500 mg by mouth daily.     No current facility-administered medications for this visit.    Allergies as of 05/12/2023 - Review Complete 05/12/2023  Allergen Reaction Noted   Gabapentin Other (See Comments) 03/21/2016    Family History  Problem Relation Age of Onset   Hypertension Father    Diabetes Father    Heart disease Father    Parkinson's disease Father    Hypertension Sister    Heart disease Brother        both brothers have heart disease   Heart disease Maternal Grandfather    Lupus Daughter    Neurofibromatosis Son    Parkinson's disease Paternal Aunt    Parkinson's disease  Paternal Uncle    Heart attack Other     Social History   Socioeconomic History   Marital status: Widowed    Spouse name: Not on file   Number of children: Not on file   Years of education: Not on file   Highest education level: Not on file  Occupational History   Not on file  Tobacco Use   Smoking status: Every Day    Current packs/day: 1.00    Average packs/day: 1 pack/day for 35.0 years (35.0 ttl pk-yrs)    Types: Cigarettes    Passive exposure: Current   Smokeless tobacco: Never  Vaping Use   Vaping status: Never Used  Substance and Sexual Activity   Alcohol use: No   Drug use: No   Sexual activity: Yes    Birth control/protection: None  Other Topics Concern   Not on file  Social History Narrative   Disabled   Right handed   Social Determinants of Health   Financial Resource Strain: Low Risk  (03/14/2023)   Overall Financial Resource Strain (CARDIA)    Difficulty of Paying Living Expenses: Not hard at all  Food Insecurity: No Food Insecurity (03/14/2023)   Hunger Vital Sign    Worried About Running Out of Food in the Last Year: Never true    Ran Out of Food in the Last Year: Never true  Transportation Needs: No Transportation Needs (03/14/2023)   PRAPARE - Administrator, Civil Service (Medical): No    Lack of Transportation (Non-Medical): No  Physical Activity: Insufficiently Active (03/14/2023)   Exercise Vital Sign    Days of Exercise per Week: 7 days    Minutes of Exercise per Session: 20 min  Stress: No Stress Concern Present (03/14/2023)   [employer] of Occupational Health - Occupational Stress Questionnaire    Feeling of Stress : Not at all  Social Connections: Moderately Isolated (03/14/2023)   Social Connection and Isolation Panel [NHANES]    Frequency of Communication with Friends and Family: More than three times a  week    Frequency of Social Gatherings with Friends and Family: More than three times a week    Attends Religious  Services: 1 to 4 times per year    Active Member of [employer] or Organizations: No    Attends Banker Meetings: Never    Marital Status: Widowed    Review of systems General: negative for malaise, night sweats, fever, chills, weight loss Neck: Negative for lumps, goiter, pain and significant neck swelling Resp: Negative for cough, wheezing, dyspnea at rest CV: Negative for chest pain, leg swelling, palpitations, orthopnea GI: denies melena, hematochezia, nausea, vomiting, diarrhea, constipation, dysphagia, odyonophagia, early satiety or unintentional weight loss.  MSK: Negative for joint pain or swelling, back pain, and muscle pain. Derm: Negative for itching or rash Psych: Denies depression, anxiety, memory loss, confusion. No homicidal or suicidal ideation.  Neuro: negative for tremor, gait imbalance, syncope and seizures. The remainder of the review of systems is noncontributory.  Physical Exam: BP 134/84 (BP Location: Right Arm, Patient Position: Sitting, Cuff Size: Large)   Pulse (!) 104   Temp 97.7 F (36.5 C) (Oral)   Ht 5\' 7"  (1.702 m)   Wt 257 lb (116.6 kg)   BMI 40.25 kg/m  General:   Alert and oriented. No distress noted. Pleasant and cooperative.  Head:  Normocephalic and atraumatic. Eyes:  Conjuctiva clear without scleral icterus. Mouth:  Oral mucosa pink and moist. Good dentition. No lesions. Heart: Normal rate and rhythm, s1 and s2 heart sounds present.  Lungs: Clear lung sounds in all lobes. Respirations equal and unlabored. Abdomen:  +BS, soft, non-tender and non-distended. No rebound or guarding. No HSM or masses noted. Derm: No palmar erythema or jaundice Msk:  Symmetrical without gross deformities. Normal posture. Extremities:  Without edema. Neurologic:  Alert and  oriented x4 Psych:  Alert and cooperative. Normal mood and affect.  Invalid input(s): "6 MONTHS"   ASSESSMENT: Shaun Harris is a 57 y.o. male presenting today for follow up of  UC  UC: Patient appears to be doing well clinically from UC standpoint, maintained on mesalamine 1.6 g daily.  He has had some constipation in the past, likely secondary to ongoing opiate use though using MiraLAX and stool softener and having 3-4 BMs per day.  Denies any abdominal pain, rectal bleeding, melena.  Last colonoscopy was in 2018, he was scheduled for colonoscopy March 2024 though this was canceled due to rectal pain secondary to thrombosed hemorrhoid which she had I&D in March by general surgery.  Unfortunately he has had some issues with nephrolithiasis, worsening kidney function and elevated potassium which delayed rescheduling repeat colonoscopy. Concern for possible interstitial nephritis secondary to mesalamine previously, though UA with sediment with WBCs, did not reveal any casts.  He is following with nephrology and now urology, last creatinine was improved at 1.34, potassium was within normal limits.  He has upcoming renal ultrasound with urology for further evaluation of his renal issues.   We will continue with mesalamine 1.6 g daily, update CBC and CMP today, discussed scheduling colonoscopy with him and he would like to hold off until January as he has custody of his newborn grandson currently.   PLAN:  CBC and CMP  2. Continue mesalamine 1.6g daily 3. Schedule colonoscopy in January-ASA III   All questions were answered, patient verbalized understanding and is in agreement with plan as outlined above.    Follow Up: 6 months   Kalyani Maeda L. Jeanmarie Hubert, MSN, APRN, AGNP-C Adult-Gerontology Nurse  Practitioner [facility]  I have reviewed the note and agree with the APP's assessment as described in this progress note  Katrinka Blazing, MD Gastroenterology and Hepatology [facility]

## 2023-05-13 LAB — COMPREHENSIVE METABOLIC PANEL
AG Ratio: 1.7 (calc) (ref 1.0–2.5)
ALT: 23 U/L (ref 9–46)
AST: 22 U/L (ref 10–35)
Albumin: 4.5 g/dL (ref 3.6–5.1)
Alkaline phosphatase (APISO): 130 U/L (ref 35–144)
BUN/Creatinine Ratio: 13 (calc) (ref 6–22)
BUN: 19 mg/dL (ref 7–25)
CO2: 27 mmol/L (ref 20–32)
Calcium: 9.6 mg/dL (ref 8.6–10.3)
Chloride: 102 mmol/L (ref 98–110)
Creat: 1.49 mg/dL — ABNORMAL HIGH (ref 0.70–1.30)
Globulin: 2.6 g/dL (ref 1.9–3.7)
Glucose, Bld: 150 mg/dL — ABNORMAL HIGH (ref 65–99)
Potassium: 4.7 mmol/L (ref 3.5–5.3)
Sodium: 141 mmol/L (ref 135–146)
Total Bilirubin: 0.3 mg/dL (ref 0.2–1.2)
Total Protein: 7.1 g/dL (ref 6.1–8.1)

## 2023-05-13 LAB — CBC
HCT: 45 % (ref 38.5–50.0)
Hemoglobin: 14.4 g/dL (ref 13.2–17.1)
MCH: 28.9 pg (ref 27.0–33.0)
MCHC: 32 g/dL (ref 32.0–36.0)
MCV: 90.4 fL (ref 80.0–100.0)
MPV: 10 fL (ref 7.5–12.5)
Platelets: 277 10*3/uL (ref 140–400)
RBC: 4.98 10*6/uL (ref 4.20–5.80)
RDW: 12.9 % (ref 11.0–15.0)
WBC: 8.6 10*3/uL (ref 3.8–10.8)

## 2023-05-20 ENCOUNTER — Other Ambulatory Visit: Payer: Self-pay | Admitting: Family Medicine

## 2023-05-20 DIAGNOSIS — E1122 Type 2 diabetes mellitus with diabetic chronic kidney disease: Secondary | ICD-10-CM

## 2023-05-28 ENCOUNTER — Other Ambulatory Visit: Payer: Self-pay | Admitting: Family Medicine

## 2023-05-28 ENCOUNTER — Telehealth: Payer: Self-pay | Admitting: *Deleted

## 2023-05-28 DIAGNOSIS — N183 Chronic kidney disease, stage 3 unspecified: Secondary | ICD-10-CM

## 2023-05-28 MED ORDER — INSULIN ASPART (W/NIACINAMIDE) 100 UNIT/ML ~~LOC~~ SOPN: 15.0000 [IU] | PEN_INJECTOR | Freq: Three times a day (TID) | SUBCUTANEOUS | 11 refills | Status: DC

## 2023-05-28 MED ORDER — NOVOLOG FLEXPEN 100 UNIT/ML ~~LOC~~ SOPN: 15.0000 [IU] | PEN_INJECTOR | Freq: Three times a day (TID) | SUBCUTANEOUS | 11 refills | Status: DC

## 2023-05-28 NOTE — Telephone Encounter (Signed)
Source  Shaun Harris (Patient)   Subject  Shaun Harris, Shaun Harris (Patient)   Topic  Clinical - Medication Question    Communication  Reason for CRM: pharmacy calling about medication that's not covered and wants to get prescription changed. [facility] . [phone]. Novolog is the prescription pharmacy wants to change insurance prefers . [facility]. Wants to know could doctor Michalko change it to this one

## 2023-05-29 NOTE — Telephone Encounter (Signed)
Tommie Sams, DO     I sent in [place] already

## 2023-06-25 ENCOUNTER — Telehealth (INDEPENDENT_AMBULATORY_CARE_PROVIDER_SITE_OTHER): Payer: Self-pay | Admitting: Gastroenterology

## 2023-06-25 NOTE — Telephone Encounter (Signed)
Left message to return call to schedule TCS with Dr.Castaneda

## 2023-07-03 NOTE — Telephone Encounter (Signed)
Pt left voicemail returning call. Pt states it would have to be after the first of the year before he can schedule anything. Returned call to patient and he states he would give [place] a call Jan 2 or the week after

## 2023-07-04 ENCOUNTER — Ambulatory Visit (INDEPENDENT_AMBULATORY_CARE_PROVIDER_SITE_OTHER): Payer: Medicare Other | Admitting: Family Medicine

## 2023-07-04 ENCOUNTER — Encounter: Payer: Self-pay | Admitting: Family Medicine

## 2023-07-04 VITALS — BP 138/72 | HR 96 | Temp 98.1°F | Ht 67.0 in | Wt 266.0 lb

## 2023-07-04 DIAGNOSIS — E1122 Type 2 diabetes mellitus with diabetic chronic kidney disease: Secondary | ICD-10-CM | POA: Diagnosis not present

## 2023-07-04 DIAGNOSIS — N183 Chronic kidney disease, stage 3 unspecified: Secondary | ICD-10-CM

## 2023-07-04 DIAGNOSIS — E785 Hyperlipidemia, unspecified: Secondary | ICD-10-CM

## 2023-07-04 DIAGNOSIS — I10 Essential (primary) hypertension: Secondary | ICD-10-CM | POA: Diagnosis not present

## 2023-07-04 DIAGNOSIS — J449 Chronic obstructive pulmonary disease, unspecified: Secondary | ICD-10-CM | POA: Diagnosis not present

## 2023-07-04 DIAGNOSIS — Z794 Long term (current) use of insulin: Secondary | ICD-10-CM | POA: Diagnosis not present

## 2023-07-04 MED ORDER — BUPROPION HCL ER (SR) 150 MG PO TB12: 150.0000 mg | ORAL_TABLET | Freq: Two times a day (BID) | ORAL | 1 refills | Status: AC

## 2023-07-04 MED ORDER — ATORVASTATIN CALCIUM 20 MG PO TABS: 20.0000 mg | ORAL_TABLET | Freq: Every day | ORAL | 1 refills | Status: DC

## 2023-07-04 NOTE — Assessment & Plan Note (Signed)
Stable currently

## 2023-07-04 NOTE — Assessment & Plan Note (Signed)
Stable.  Continue Lasix.  Follows closely with nephrology.

## 2023-07-04 NOTE — Progress Notes (Signed)
Subjective:  Patient ID: Shaun Harris, male    DOB: 1965/08/06  Age: 57 y.o. MRN: [id number]  CC:   Chief Complaint  Patient presents with   Diabetes    HPI:  57 year old male with multiple medical problems presents for follow-up.  BP stable.  A1c has been at goal.  However, patient reports that his blood sugars have been running higher.  He has ongoing stressors and is currently taking care of his 78-month-old grandchild.  Needs urine microalbumin today.    Lipids stable on Lipitor.  Patient Active Problem List   Diagnosis Date Noted   Long-term current use of mesalamine 05/12/2023   Tremor 10/02/2022   Migraine 04/04/2022   BPH (benign prostatic hyperplasia) 01/01/2022   Ulcerative colitis (HCC) 07/02/2021   Type 2 diabetes mellitus with diabetic chronic kidney disease (HCC) 07/02/2021   History of seizure 03/14/2021   Stage 3 chronic kidney disease (HCC) 02/06/2021   Nephrolithiasis 07/25/2020   COPD (chronic obstructive pulmonary disease) (HCC) 03/10/2013   Tobacco abuse 03/10/2013   Essential hypertension 10/01/2012   Hyperlipidemia 10/01/2012   Chronic pain syndrome 10/01/2012    Social Hx   Social History   Socioeconomic History   Marital status: Widowed    Spouse name: Not on file   Number of children: Not on file   Years of education: Not on file   Highest education level: Not on file  Occupational History   Not on file  Tobacco Use   Smoking status: Every Day    Current packs/day: 1.00    Average packs/day: 1 pack/day for 35.0 years (35.0 ttl pk-yrs)    Types: Cigarettes    Passive exposure: Current   Smokeless tobacco: Never  Vaping Use   Vaping status: Never Used  Substance and Sexual Activity   Alcohol use: No   Drug use: No   Sexual activity: Yes    Birth control/protection: None  Other Topics Concern   Not on file  Social History Narrative   Disabled   Right handed   Social Drivers of Health   Financial Resource Strain: Low Risk   (03/14/2023)   Overall Financial Resource Strain (CARDIA)    Difficulty of Paying Living Expenses: Not hard at all  Food Insecurity: No Food Insecurity (03/14/2023)   Hunger Vital Sign    Worried About Running Out of Food in the Last Year: Never true    Ran Out of Food in the Last Year: Never true  Transportation Needs: No Transportation Needs (03/14/2023)   PRAPARE - Administrator, Civil Service (Medical): No    Lack of Transportation (Non-Medical): No  Physical Activity: Insufficiently Active (03/14/2023)   Exercise Vital Sign    Days of Exercise per Week: 7 days    Minutes of Exercise per Session: 20 min  Stress: No Stress Concern Present (03/14/2023)   [employer] of Occupational Health - Occupational Stress Questionnaire    Feeling of Stress : Not at all  Social Connections: Moderately Isolated (03/14/2023)   Social Connection and Isolation Panel [NHANES]    Frequency of Communication with Friends and Family: More than three times a week    Frequency of Social Gatherings with Friends and Family: More than three times a week    Attends Religious Services: 1 to 4 times per year    Active Member of [employer] or Organizations: No    Attends Banker Meetings: Never    Marital Status: Widowed  Review of Systems  Respiratory: Negative.    Cardiovascular: Negative.   Musculoskeletal:  Positive for arthralgias and back pain.   Objective:  BP 138/72   Pulse 96   Temp 98.1 F (36.7 C)   Ht 5\' 7"  (1.702 m)   Wt 266 lb (120.7 kg)   SpO2 93%   BMI 41.66 kg/m      07/04/2023   10:23 AM 07/04/2023   10:08 AM 05/12/2023   10:08 AM  BP/Weight  Systolic BP 138 142 134  Diastolic BP 72 92 84  Wt. (Lbs)  266 257  BMI  41.66 kg/m2 40.25 kg/m2    Physical Exam Vitals and nursing note reviewed.  Constitutional:      Appearance: Normal appearance.  HENT:     Head: Normocephalic and atraumatic.  Cardiovascular:     Rate and Rhythm: Normal rate and  regular rhythm.  Pulmonary:     Effort: Pulmonary effort is normal.     Breath sounds: Normal breath sounds. No wheezing or rales.  Neurological:     Mental Status: He is alert.     Lab Results  Component Value Date   WBC 8.6 05/12/2023   HGB 14.4 05/12/2023   HCT 45.0 05/12/2023   PLT 277 05/12/2023   GLUCOSE 150 (H) 05/12/2023   CHOL 155 10/02/2022   TRIG 340 (H) 10/02/2022   HDL 35 (L) 10/02/2022   LDLCALC 52 10/02/2022   ALT 23 05/12/2023   AST 22 05/12/2023   NA 141 05/12/2023   K 4.7 05/12/2023   CL 102 05/12/2023   CREATININE 1.49 (H) 05/12/2023   BUN 19 05/12/2023   CO2 27 05/12/2023   TSH 1.57 11/19/2022   HGBA1C 6.4 (A) 03/03/2023     Assessment & Plan:   Problem List Items Addressed This Visit       Cardiovascular and Mediastinum   Essential hypertension   Stable.  Continue Lasix.  Follows closely with nephrology.      Relevant Medications   atorvastatin (LIPITOR) 20 MG tablet     Respiratory   COPD (chronic obstructive pulmonary disease) (HCC)   Stable currently.        Endocrine   Type 2 diabetes mellitus with diabetic chronic kidney disease (HCC) - Primary   Has been stable.  Follows with Endo.  Continue Evaristo Bury and Fiasp      Relevant Medications   atorvastatin (LIPITOR) 20 MG tablet   Other Relevant Orders   Microalbumin/Creatinine Ratio, Urine     Other   Hyperlipidemia   Stable on statin.  Continue.      Relevant Medications   atorvastatin (LIPITOR) 20 MG tablet    Meds ordered this encounter  Medications   atorvastatin (LIPITOR) 20 MG tablet    Sig: Take 1 tablet (20 mg total) by mouth daily.    Dispense:  90 tablet    Refill:  1    This prescription was filled on 02/18/2023. Any refills authorized will be placed on file.   buPROPion (WELLBUTRIN SR) 150 MG 12 hr tablet    Sig: Take 1 tablet (150 mg total) by mouth 2 (two) times daily.    Dispense:  180 tablet    Refill:  1    This prescription was filled on 12/24/2022.  Any refills authorized will be placed on file.    Follow-up:  Return in about 6 months (around 01/02/2024).  Everlene Other DO [facility]

## 2023-07-04 NOTE — Assessment & Plan Note (Signed)
Stable on statin. Continue.

## 2023-07-04 NOTE — Patient Instructions (Signed)
Urine today.  Follow up in 6 months.  Take care  Dr. Adriana Simas

## 2023-07-04 NOTE — Assessment & Plan Note (Signed)
Has been stable.  Follows with Endo.  Continue Evaristo Bury and [employer]

## 2023-07-05 LAB — MICROALBUMIN / CREATININE URINE RATIO
Creatinine, Urine: 30 mg/dL
Microalb/Creat Ratio: <10 mg/g{creat} (ref 0–29)
Microalbumin, Urine: <3 ug/mL

## 2023-07-15 ENCOUNTER — Ambulatory Visit (INDEPENDENT_AMBULATORY_CARE_PROVIDER_SITE_OTHER): Payer: Medicare Other | Admitting: Nurse Practitioner

## 2023-07-15 ENCOUNTER — Encounter: Payer: Self-pay | Admitting: Nurse Practitioner

## 2023-07-15 VITALS — BP 143/86 | HR 102 | Ht 67.0 in | Wt 270.0 lb

## 2023-07-15 DIAGNOSIS — F17200 Nicotine dependence, unspecified, uncomplicated: Secondary | ICD-10-CM | POA: Diagnosis not present

## 2023-07-15 DIAGNOSIS — Z794 Long term (current) use of insulin: Secondary | ICD-10-CM | POA: Diagnosis not present

## 2023-07-15 DIAGNOSIS — E1122 Type 2 diabetes mellitus with diabetic chronic kidney disease: Secondary | ICD-10-CM | POA: Diagnosis not present

## 2023-07-15 DIAGNOSIS — N183 Chronic kidney disease, stage 3 unspecified: Secondary | ICD-10-CM

## 2023-07-15 LAB — POCT GLYCOSYLATED HEMOGLOBIN (HGB A1C): Hemoglobin A1C: 8.5 % — AB (ref 4.0–5.6)

## 2023-07-15 MED ORDER — TRESIBA FLEXTOUCH 200 UNIT/ML ~~LOC~~ SOPN: 60.0000 [IU] | PEN_INJECTOR | Freq: Every day | SUBCUTANEOUS | 3 refills | Status: DC

## 2023-07-15 MED ORDER — INSULIN ASPART (W/NIACINAMIDE) 100 UNIT/ML ~~LOC~~ SOPN: 10.0000 [IU] | PEN_INJECTOR | Freq: Three times a day (TID) | SUBCUTANEOUS | 3 refills | Status: DC

## 2023-07-15 MED ORDER — PEN NEEDLES 31G X 8 MM MISC: 3 refills | Status: DC

## 2023-07-15 NOTE — Progress Notes (Signed)
 Endocrinology Follow Up Note       07/15/2023, 8:22 AM   Subjective:    Patient ID: Shaun Harris, male    DOB: Oct 06, 1965.  Shaun Harris is being seen in follow up after being seen in consultation for management of currently uncontrolled symptomatic diabetes requested by  Fooks, Jayce G, DO.   Past Medical History:  Diagnosis Date   Anxiety    Arthritis    Cerebral vasculitis    Chronic headaches    constant since possible stroke in 1997,Vasculitis of brain with increased pressure of brain .   COPD (chronic obstructive pulmonary disease) (HCC)    COPD (chronic obstructive pulmonary disease) (HCC)    Diabetes mellitus    Hyperlipidemia    Hypertension    Hypertriglyceridemia    Sleep apnea    has but doesnt use, cannot tolerate; PCP aware   Stroke [facility])    pt states,they arent sure if i had a stroke in 1997, there is still a question about that with my doctors. No deficits.   Tobacco abuse    Ulcerative colitis (HCC)    Ulcerative colitis (HCC)    Venous stasis     Past Surgical History:  Procedure Laterality Date   BIOPSY  10/25/2016   Procedure: BIOPSY;  Surgeon: Claudis RAYMOND Rivet, MD;  [facility];  Service: Endoscopy;;  colon   CERVICAL SPINE SURGERY  2008   x2-last surgery 04/24/2016.   COLONOSCOPY WITH PROPOFOL  N/A 10/25/2016   Procedure: COLONOSCOPY WITH PROPOFOL ;  Surgeon: Claudis RAYMOND Rivet, MD;  Location: [facility];  Service: Endoscopy;  Laterality: N/A;  7:30   EXTRACORPOREAL SHOCK WAVE LITHOTRIPSY Left 08/08/2020   Procedure: EXTRACORPOREAL SHOCK WAVE LITHOTRIPSY (ESWL);  Surgeon: Sherrilee Belvie CROME, MD;  Location: [facility];  Service: Urology;  Laterality: Left;   KNEE ARTHROSCOPY  02/25/2012   Procedure: ARTHROSCOPY KNEE;  Surgeon: Lemond Stable, MD;  Location: [facility];  Service: Orthopedics;  Laterality: Right;   KNEE ARTHROSCOPY WITH MEDIAL MENISECTOMY Left 12/23/2014    Procedure: LEFT KNEE ARTHROSCOPY WITH PARTIAL MENISECTOMY;  Surgeon: Lemond Stable, MD;  Location: [facility];  Service: Orthopedics;  Laterality: Left;   POLYPECTOMY  10/25/2016   Procedure: POLYPECTOMY;  Surgeon: Claudis RAYMOND Rivet, MD;  [facility];  Service: Endoscopy;;  colon   SHOULDER ARTHROSCOPY  2004   left shoulder   SHOULDER ARTHROSCOPY  2008   right shoulder    Social History   Socioeconomic History   Marital status: Widowed    Spouse name: Not on file   Number of children: Not on file   Years of education: Not on file   Highest education level: Not on file  Occupational History   Not on file  Tobacco Use   Smoking status: Every Day    Current packs/day: 1.00    Average packs/day: 1 pack/day for 35.0 years (35.0 ttl pk-yrs)    Types: Cigarettes    Passive exposure: Current   Smokeless tobacco: Never  Vaping Use   Vaping status: Never Used  Substance and Sexual Activity   Alcohol use: No   Drug use: No   Sexual activity: Yes  Birth control/protection: None  Other Topics Concern   Not on file  Social History Narrative   Disabled   Right handed   Social Drivers of Health   Financial Resource Strain: Low Risk  (03/14/2023)   Overall Financial Resource Strain (CARDIA)    Difficulty of Paying Living Expenses: Not hard at all  Food Insecurity: No Food Insecurity (03/14/2023)   Hunger Vital Sign    Worried About Running Out of Food in the Last Year: Never true    Ran Out of Food in the Last Year: Never true  Transportation Needs: No Transportation Needs (03/14/2023)   PRAPARE - Administrator, Civil Service (Medical): No    Lack of Transportation (Non-Medical): No  Physical Activity: Insufficiently Active (03/14/2023)   Exercise Vital Sign    Days of Exercise per Week: 7 days    Minutes of Exercise per Session: 20 min  Stress: No Stress Concern Present (03/14/2023)   [employer] of Occupational Health - Occupational Stress Questionnaire     Feeling of Stress : Not at all  Social Connections: Moderately Isolated (03/14/2023)   Social Connection and Isolation Panel [NHANES]    Frequency of Communication with Friends and Family: More than three times a week    Frequency of Social Gatherings with Friends and Family: More than three times a week    Attends Religious Services: 1 to 4 times per year    Active Member of [employer] or Organizations: No    Attends Banker Meetings: Never    Marital Status: Widowed    Family History  Problem Relation Age of Onset   Hypertension Father    Diabetes Father    Heart disease Father    Parkinson's disease Father    Hypertension Sister    Heart disease Brother        both brothers have heart disease   Heart disease Maternal Grandfather    Lupus Daughter    Neurofibromatosis Son    Parkinson's disease Paternal Aunt    Parkinson's disease Paternal Uncle    Heart attack Other     Outpatient Encounter Medications as of 07/15/2023  Medication Sig   acetaminophen  (TYLENOL ) 500 MG tablet Take 1,000 mg by mouth every 6 (six) hours as needed for moderate pain.   AIMOVIG 70 MG/ML SOAJ Inject 70 mg into the skin every 30 (thirty) days.   albuterol  (VENTOLIN  HFA) 108 (90 Base) MCG/ACT inhaler inhale TWO puffs by MOUTH every SIX hours as needed for shortness of breath   ALPRAZolam  (XANAX ) 0.25 MG tablet Take 0.5 mg by mouth 3 (three) times daily.   aspirin EC 325 MG tablet Take 325 mg by mouth at bedtime.   atorvastatin  (LIPITOR) 20 MG tablet Take 1 tablet (20 mg total) by mouth daily.   Blood Glucose Monitoring Suppl w/Device KIT Test glucose 4 times daily. E11.9   buPROPion  (WELLBUTRIN  SR) 150 MG 12 hr tablet Take 1 tablet (150 mg total) by mouth 2 (two) times daily.   furosemide  (LASIX ) 20 MG tablet Take 1 tablet (20 mg total) by mouth daily.   glucose blood (TRUE METRIX BLOOD GLUCOSE TEST) test strip check blood sugar FOUR TIMES DAILY   hydrOXYzine  (ATARAX /VISTARIL ) 50 MG tablet  Take 50 mg by mouth daily as needed for anxiety.   Insulin  Pen Needle (PEN NEEDLES) 31G X 8 MM MISC Use to inject insulin  4 times daily   Insulin  Syringe-Needle U-100 (BD VEO INSULIN  SYRINGE U/F) 31G X 15/64 1  ML MISC AS DIRECTED   levETIRAcetam  (KEPPRA ) 500 MG tablet TAKE THREE TABLETS BY MOUTH TWICE DAILY   Mesalamine  (ASACOL ) 400 MG CPDR DR capsule TAKE ONE CAPSULE BY MOUTH FOUR TIMES DAILY   morphine  (MS CONTIN ) 30 MG 12 hr tablet Take 1 tablet (30 mg total) by mouth 2 (two) times daily. May fill 60 days after 09/30/2017   Multiple Vitamins-Minerals (CENTRUM PO) Take 1 tablet by mouth daily.   ondansetron  (ZOFRAN -ODT) 8 MG disintegrating tablet Take 1 tablet (8 mg total) by mouth every 8 (eight) hours as needed for nausea or vomiting.   Oxycodone  HCl 10 MG TABS Take 1 tablet by mouth 4 times a day as needed for pain. Do not drive or operate machinery while on this medicine. (Patient taking differently: Take 10 mg by mouth 4 (four) times daily as needed (pain). Do not drive or operate machinery while on this medicine.)   polyethylene glycol (MIRALAX ) 17 g packet Take 17 g by mouth daily.   QUEtiapine (SEROQUEL) 100 MG tablet Take 100 mg by mouth at bedtime.   sodium bicarbonate  650 MG tablet TAKE 1 TABLET BY MOUTH TWICE DAILY   tamsulosin  (FLOMAX ) 0.4 MG CAPS capsule Take 1 capsule (0.4 mg total) by mouth 2 (two) times daily.   traZODone (DESYREL) 100 MG tablet Take 100 mg by mouth at bedtime.   TRUEPLUS INSULIN  SYRINGE 31G X 5/16 1 ML MISC AS DIRECTED   venlafaxine  XR (EFFEXOR -XR) 37.5 MG 24 hr capsule Take 1 capsule (37.5 mg total) by mouth daily.   vitamin C (ASCORBIC ACID) 500 MG tablet Take 500 mg by mouth daily.   [DISCONTINUED] insulin  aspart (FIASP ) 100 UNIT/ML FlexTouch Pen Inject 15 Units into the skin with breakfast, with lunch, and with evening meal.   [DISCONTINUED] insulin  degludec (TRESIBA  FLEXTOUCH) 200 UNIT/ML FlexTouch Pen Inject 70 Units into the skin at bedtime.    fluticasone -salmeterol (ADVAIR) 250-50 MCG/ACT AEPB inhale ONE PUFF by MOUTH every 12 hours. <<rinse MOUTH AFTER use>>   insulin  aspart (FIASP ) 100 UNIT/ML FlexTouch Pen Inject 10-16 Units into the skin with breakfast, with lunch, and with evening meal.   insulin  degludec (TRESIBA  FLEXTOUCH) 200 UNIT/ML FlexTouch Pen Inject 60 Units into the skin at bedtime.   No facility-administered encounter medications on file as of 07/15/2023.    ALLERGIES: Allergies  Allergen Reactions   Gabapentin Other (See Comments)    Increased blood sugar    VACCINATION STATUS: Immunization History  Administered Date(s) Administered   Influenza Split 04/01/2013   Influenza, Seasonal, Injecte, Preservative Fre 04/04/2023   Influenza,inj,Quad PF,6+ Mos 03/30/2014, 03/22/2015, 03/21/2016, 04/07/2017, 03/30/2018, 07/02/2021, 07/03/2022   Pneumococcal Polysaccharide-23 07/02/2018   Pneumococcal-Unspecified 06/26/2004    Diabetes He presents for his follow-up diabetic visit. He has type 2 diabetes mellitus. Onset time: diagnosed at approx age of 33. His disease course has been fluctuating. Hypoglycemia symptoms include nervousness/anxiousness, sweats and tremors. Associated symptoms include blurred vision and fatigue. There are no hypoglycemic complications. Symptoms are stable. Diabetic complications include a CVA, heart disease, nephropathy and peripheral neuropathy. Risk factors for coronary artery disease include tobacco exposure, sedentary lifestyle, male sex, obesity, hypertension, diabetes mellitus, dyslipidemia and family history. Current diabetic treatment includes intensive insulin  program. He is compliant with treatment most of the time. His weight is increasing rapidly. He is following a generally unhealthy diet. When asked about meal planning, he reported none. He has not had a previous visit with a dietitian. He rarely participates in exercise. His home blood glucose trend  is fluctuating dramatically. His  overall blood glucose range is >200 mg/dl. (He presents today with his meter and logs showing improving glycemic profile.  His POCT A1c today is 8.5%, worsening from last visit of 6.4%.  He has had more frequent hypoglycemia since last visit.  Analysis of his meter shows 7-day average of 224, 14-day average of 210, 30-day average of 190.  His PCP sent in Fiasp  for him as Novolog  was no longer covered under his insurance plan but he has only been injecting 15 units, not following my SSI given at last visit.) An ACE inhibitor/angiotensin II receptor blocker is being taken. He does not see a podiatrist (saw in distant past).Eye exam is not current.     Review of systems  Constitutional: + increasing body weight, current Body mass index is 42.29 kg/m. , no fatigue, no subjective hyperthermia, no subjective hypothermia Eyes: no blurry vision, no xerophthalmia ENT: no sore throat, no nodules palpated in throat, no dysphagia/odynophagia, no hoarseness Cardiovascular: no chest pain, no shortness of breath, no palpitations, + leg swelling Respiratory: no cough, no shortness of breath Gastrointestinal: no nausea/vomiting/diarrhea, + constipation-improved Musculoskeletal: no muscle/joint aches Skin: no rashes, no hyperemia Neurological: no tremors, no numbness, no tingling, no dizziness Psychiatric: no depression, no anxiety  Objective:     BP (!) 143/86 (BP Location: Left Arm, Cuff Size: Large)   Pulse (!) 102   Ht 5' 7 (1.702 m)   Wt 270 lb (122.5 kg)   BMI 42.29 kg/m   Wt Readings from Last 3 Encounters:  07/15/23 270 lb (122.5 kg)  07/04/23 266 lb (120.7 kg)  05/12/23 257 lb (116.6 kg)     BP Readings from Last 3 Encounters:  07/15/23 (!) 143/86  07/04/23 138/72  05/12/23 134/84     Physical Exam- Limited  Constitutional:  Body mass index is 42.29 kg/m. , not in acute distress, normal state of mind Eyes:  EOMI, no exophthalmos Musculoskeletal: no gross deformities, strength  intact in all four extremities, no gross restriction of joint movements Skin:  no rashes, + hyperemia to BLE, nicotinic discoloration to bilateral fingertips Neurological: no tremor with outstretched hands   Diabetic Foot Exam - Simple   No data filed     CMP ( most recent) CMP     Component Value Date/Time   NA 141 05/12/2023 1055   NA 141 04/16/2023 0900   K 4.7 05/12/2023 1055   CL 102 05/12/2023 1055   CO2 27 05/12/2023 1055   GLUCOSE 150 (H) 05/12/2023 1055   BUN 19 05/12/2023 1055   BUN 26 (H) 04/16/2023 0900   CREATININE 1.49 (H) 05/12/2023 1055   CALCIUM  9.6 05/12/2023 1055   PROT 7.1 05/12/2023 1055   PROT 6.4 10/01/2021 1004   ALBUMIN 4.4 11/15/2022 0636   ALBUMIN 4.6 10/01/2021 1004   AST 22 05/12/2023 1055   ALT 23 05/12/2023 1055   ALKPHOS 82 11/15/2022 0636   BILITOT 0.3 05/12/2023 1055   BILITOT <0.2 10/01/2021 1004   GFRNONAA 50 (L) 11/15/2022 0636   GFRAA 98 03/28/2020 0939     Diabetic Labs (most recent): Lab Results  Component Value Date   HGBA1C 8.5 (A) 07/15/2023   HGBA1C 6.4 (A) 03/03/2023   HGBA1C 9.0 (H) 10/02/2022     Lipid Panel ( most recent) Lipid Panel     Component Value Date/Time   CHOL 155 10/02/2022 1139   CHOL 142 01/01/2022 1157   TRIG 340 (H) 10/02/2022 1139   HDL  35 (L) 10/02/2022 1139   HDL 45 01/01/2022 1157   CHOLHDL 4.4 10/02/2022 1139   VLDL 68 (H) 10/02/2022 1139   LDLCALC 52 10/02/2022 1139   LDLCALC 75 01/01/2022 1157   LABVLDL 22 01/01/2022 1157      Lab Results  Component Value Date   TSH 1.57 11/19/2022           Assessment & Plan:   1) Type 2 diabetes mellitus with hyperglycemia, with long-term current use of insulin   He presents today with his meter and logs showing improving glycemic profile.  His POCT A1c today is 8.5%, worsening from last visit of 6.4%.  He has had more frequent hypoglycemia since last visit.  Analysis of his meter shows 7-day average of 224, 14-day average of 210, 30-day  average of 190.  His PCP sent in Fiasp  for him as Novolog  was no longer covered under his insurance plan but he has only been injecting 15 units, not following my SSI given at last visit.  - Shaun Harris has currently uncontrolled symptomatic type 2 DM since 57 years of age.   -Recent labs reviewed.  - I had a long discussion with him about the progressive nature of diabetes and the pathology behind its complications. -his diabetes is complicated by CKD stage 3a, ?CVA, tobacco abuse and he remains at a high risk for more acute and chronic complications which include CAD, CVA, CKD, retinopathy, and neuropathy. These are all discussed in detail with him.  The following Lifestyle Medicine recommendations according to American College of Lifestyle Medicine [facility]) were discussed and offered to patient and he agrees to start the journey:  A. Whole Foods, Plant-based plate comprising of fruits and vegetables, plant-based proteins, whole-grain carbohydrates was discussed in detail with the patient.   A list for source of those nutrients were also provided to the patient.  Patient will use only water or unsweetened tea for hydration. B.  The need to stay away from risky substances including alcohol, smoking; obtaining 7 to 9 hours of restorative sleep, at least 150 minutes of moderate intensity exercise weekly, the importance of healthy social connections,  and stress reduction techniques were discussed. C.  A full color page of  Calorie density of various food groups per pound showing examples of each food groups was provided to the patient.  - Nutritional counseling repeated at each appointment due to patients tendency to fall back in to old habits.  - The patient admits there is a room for improvement in their diet and drink choices. -  Suggestion is made for the patient to avoid simple carbohydrates from their diet including Cakes, Sweet Desserts / Pastries, Ice Cream, Soda (diet and regular), Sweet  Tea, Candies, Chips, Cookies, Sweet Pastries, Store Bought Juices, Alcohol in Excess of 1-2 drinks a day, Artificial Sweeteners, Coffee Creamer, and Sugar-free Products. This will help patient to have stable blood glucose profile and potentially avoid unintended weight gain.   - I encouraged the patient to switch to unprocessed or minimally processed complex starch and increased protein intake (animal or plant source), fruits, and vegetables.   - Patient is advised to stick to a routine mealtimes to eat 3 meals a day and avoid unnecessary snacks (to snack only to correct hypoglycemia).  - I have approached him with the following individualized plan to manage his diabetes and patient agrees:   -He is advised to lower his Tresiba  to 60 units SQ nightly (suspect liver dumping at night) and  adjust his Fiasp  to 10-16 units TID with meals if glucose is above 90 and he is eating (Specific instructions on how to titrate insulin  dosage based on glucose readings given to patient in writing).   He is advised to stay off the Ozempic  due to severe constipation he had previously while taking it (he is also heavy smoker which increases his risk of pancreatitis on the GLP1 products).  -he is encouraged to continue monitoring glucose 4 times daily, before meals and before bed, and to call the clinic if he has readings less than 70 or above 300 for 3 tests in a row.  We did talk about possibility of adding CGM in future, he is not interested at this time.  - he is warned not to take insulin  without proper monitoring per orders. - Adjustment parameters are given to him for hypo and hyperglycemia in writing.  - Specific targets for  A1c; LDL, HDL, and Triglycerides were discussed with the patient.  2) Blood Pressure /Hypertension:  his blood pressure is controlled to target, tight today.   he is advised to continue his current medications including Lasix  20 mg p.o. daily with breakfast, Enalapril  20 mg po  daily.  3) Lipids/Hyperlipidemia:    Review of his recent lipid panel from 10/03/22 showed controlled LDL at 52 and significantly elevated triglycerides of 340 .  he is advised to continue Lipitor 20 mg daily at bedtime.  Side effects and precautions discussed with him.  We also discussed importance of low fat diet today.  4)  Weight/Diet:  his Body mass index is 42.29 kg/m.  -  clearly complicating his diabetes care.   he is a candidate for weight loss. I discussed with him the fact that loss of 5 - 10% of his  current body weight will have the most impact on his diabetes management.  Exercise, and detailed carbohydrates information provided  -  detailed on discharge instructions.  5) Chronic Care/Health Maintenance: -he is on ACEI/ARB and Statin medications and is encouraged to initiate and continue to follow up with Ophthalmology, Dentist, Podiatrist at least yearly or according to recommendations, and advised to QUIT SMOKING. I have recommended yearly flu vaccine and pneumonia vaccine at least every 5 years; moderate intensity exercise for up to 150 minutes weekly; and sleep for at least 7 hours a day.  - he is advised to maintain close follow up with Hoffart, Jayce G, DO for primary care needs, as well as his other providers for optimal and coordinated care.     I spent  36  minutes in the care of the patient today including review of labs from CMP, Lipids, Thyroid  Function, Hematology (current and previous including abstractions from other facilities); face-to-face time discussing  his blood glucose readings/logs, discussing hypoglycemia and hyperglycemia episodes and symptoms, medications doses, his options of short and long term treatment based on the latest standards of care / guidelines;  discussion about incorporating lifestyle medicine;  and documenting the encounter. Risk reduction counseling performed per USPSTF guidelines to reduce obesity and cardiovascular risk factors.     Please  refer to Patient Instructions for Blood Glucose Monitoring and Insulin /Medications Dosing Guide  in media tab for additional information. Please  also refer to  Patient Self Inventory in the Media  tab for reviewed elements of pertinent patient history.  Shaun Harris participated in the discussions, expressed understanding, and voiced agreement with the above plans.  All questions were answered to his satisfaction. he is  encouraged to contact clinic should he have any questions or concerns prior to his return visit.     Follow up plan: - Return in about 4 months (around 11/12/2023) for Diabetes F/U with A1c in office, No previsit labs, Bring meter and logs.   Benton Rio, [facility] [address] [place], [place] [address] Phone: [phone] Fax: [phone]  07/15/2023, 8:22 AM

## 2023-07-24 LAB — HM DIABETES EYE EXAM

## 2023-08-21 ENCOUNTER — Other Ambulatory Visit: Payer: Self-pay | Admitting: Family Medicine

## 2023-08-27 LAB — LAB REPORT - SCANNED
A1c: 8.3
Calcium: 9.3
Creatinine, POC: 182.4 mg/dL
EGFR: 68
PTH: 17

## 2023-09-02 ENCOUNTER — Other Ambulatory Visit: Payer: Self-pay | Admitting: Family Medicine

## 2023-09-03 ENCOUNTER — Telehealth: Payer: Self-pay | Admitting: Pharmacist

## 2023-09-03 NOTE — Telephone Encounter (Signed)
PA for fiasp attempted (did not submit) It looks like he needs to try preferred agent novolog flexpen He filled fiasp on 08/01/23 though so I'm not sure if he is able to refill; would check with patient but will likely have to transition to novolog for meal time coverage (same dosing)  Fiasp "The requested drug is not covered by the plan. In order for it to be covered, the member is required to have tried and failed all formulary alternatives or confirm that you believe the formulary alternatives would have serious adverse effects and/or be ineffective for the patient. Please answer if the following covered formulary alternatives have been tried and failed, are likely to cause adverse reaction, or are likely to be ineffective.*"

## 2023-09-04 NOTE — Telephone Encounter (Signed)
Everlene Other G, DO     Defer to Endo.

## 2023-09-04 NOTE — Telephone Encounter (Signed)
Danella Maiers, RPH     So no action on our part?

## 2023-09-04 NOTE — Telephone Encounter (Signed)
Everlene Other G, DO     We can send in novolog but I would prefer endo do it.

## 2023-09-07 ENCOUNTER — Other Ambulatory Visit: Payer: Self-pay | Admitting: Family Medicine

## 2023-09-15 ENCOUNTER — Ambulatory Visit (INDEPENDENT_AMBULATORY_CARE_PROVIDER_SITE_OTHER): Payer: Medicare Other | Admitting: Gastroenterology

## 2023-09-16 ENCOUNTER — Ambulatory Visit (HOSPITAL_COMMUNITY)
Admission: RE | Admit: 2023-09-16 | Discharge: 2023-09-16 | Disposition: A | Discharge status: Home / Self Care | Payer: Medicare Other | Source: Ambulatory Visit | Attending: Urology | Admitting: Urology

## 2023-09-16 DIAGNOSIS — N200 Calculus of kidney: Secondary | ICD-10-CM | POA: Diagnosis present

## 2023-09-24 ENCOUNTER — Ambulatory Visit: Payer: Medicare Other | Admitting: Urology

## 2023-09-24 VITALS — BP 142/83 | HR 114

## 2023-09-24 DIAGNOSIS — N200 Calculus of kidney: Secondary | ICD-10-CM

## 2023-09-24 DIAGNOSIS — R339 Retention of urine, unspecified: Secondary | ICD-10-CM | POA: Diagnosis not present

## 2023-09-24 DIAGNOSIS — Z87442 Personal history of urinary calculi: Secondary | ICD-10-CM | POA: Diagnosis not present

## 2023-09-24 LAB — BLADDER SCAN AMB NON-IMAGING: Scan Result: 0

## 2023-09-24 MED ORDER — TAMSULOSIN HCL 0.4 MG PO CAPS: 0.4000 mg | ORAL_CAPSULE | Freq: Two times a day (BID) | ORAL | 3 refills | Status: DC

## 2023-09-24 NOTE — Progress Notes (Signed)
 post void residual=0 ?

## 2023-09-24 NOTE — Progress Notes (Signed)
 09/24/2023 2:45 PM   Shaun Harris October 06, 1965 [id number]  Referring provider: Tommie Sams, DO [address] Shaun Harris [place],  [place] [address]  Followup nephrolithiasis   HPI: [name] is a 57yo here for followup for BPH with urinary retention and nephrolithiasis. Renal US shows no calculi. He is on sodium bicarb 650mg  BID. He is also on lasix. IPSS 18 QOL: 3 on flomax 0.4mg  BID. He has intermittent right flank pain. No stone events since last visit.    PMH: Past Medical History:  Diagnosis Date   Anxiety    Arthritis    Cerebral vasculitis    Chronic headaches    constant since possible stroke in 1997,"Vasculitis of brain with increased pressure of brain ".   COPD (chronic obstructive pulmonary disease) (HCC)    COPD (chronic obstructive pulmonary disease) (HCC)    Diabetes mellitus    Hyperlipidemia    Hypertension    Hypertriglyceridemia    Sleep apnea    has but doesnt use, cannot tolerate; PCP aware   Stroke [facility])    pt states,"they arent sure if i had a stroke in 1997, there is still a question about that with my doctors". No deficits.   Tobacco abuse    Ulcerative colitis (HCC)    Ulcerative colitis (HCC)    Venous stasis     Surgical History: Past Surgical History:  Procedure Laterality Date   BIOPSY  10/25/2016   Procedure: BIOPSY;  Surgeon: Malissa Hippo, MD;  [facility]: [facility];  Service: Endoscopy;;  colon   CERVICAL SPINE SURGERY  2008   x2-last surgery 04/24/2016.   COLONOSCOPY WITH PROPOFOL N/A 10/25/2016   Procedure: COLONOSCOPY WITH PROPOFOL;  Surgeon: Malissa Hippo, MD;  Location: [facility];  Service: Endoscopy;  Laterality: N/A;  7:30   EXTRACORPOREAL SHOCK WAVE LITHOTRIPSY Left 08/08/2020   Procedure: EXTRACORPOREAL SHOCK WAVE LITHOTRIPSY (ESWL);  Surgeon: Malen Gauze, MD;  Location: [facility];  Service: Urology;  Laterality: Left;   KNEE ARTHROSCOPY  02/25/2012   Procedure: ARTHROSCOPY KNEE;  Surgeon: Darreld Mclean, MD;  Location:  [facility];  Service: Orthopedics;  Laterality: Right;   KNEE ARTHROSCOPY WITH MEDIAL MENISECTOMY Left 12/23/2014   Procedure: LEFT KNEE ARTHROSCOPY WITH PARTIAL MENISECTOMY;  Surgeon: Darreld Mclean, MD;  Location: [facility];  Service: Orthopedics;  Laterality: Left;   POLYPECTOMY  10/25/2016   Procedure: POLYPECTOMY;  Surgeon: Malissa Hippo, MD;  [facility];  Service: Endoscopy;;  colon   SHOULDER ARTHROSCOPY  2004   left shoulder   SHOULDER ARTHROSCOPY  2008   right shoulder    Home Medications:  Allergies as of 09/24/2023       Reactions   Gabapentin Other (See Comments)   Increased blood sugar        Medication List        Accurate as of September 24, 2023  2:45 PM. If you have any questions, ask your nurse or doctor.          acetaminophen 500 MG tablet Commonly known as: TYLENOL Take 1,000 mg by mouth every 6 (six) hours as needed for moderate pain.   Aimovig 70 MG/ML Soaj Generic drug: Erenumab-aooe Inject 70 mg into the skin every 30 (thirty) days.   albuterol 108 (90 Base) MCG/ACT inhaler Commonly known as: Ventolin HFA inhale TWO puffs by MOUTH every SIX hours as needed for shortness of breath   ALPRAZolam 0.25 MG tablet Commonly known as: XANAX Take  0.5 mg by mouth 3 (three) times daily.   ascorbic acid 500 MG tablet Commonly known as: VITAMIN C Take 500 mg by mouth daily.   aspirin EC 325 MG tablet Take 325 mg by mouth at bedtime.   atorvastatin 20 MG tablet Commonly known as: LIPITOR Take 1 tablet (20 mg total) by mouth daily.   Blood Glucose Monitoring Suppl w/Device Kit Test glucose 4 times daily. E11.9   buPROPion 150 MG 12 hr tablet Commonly known as: WELLBUTRIN SR Take 1 tablet (150 mg total) by mouth 2 (two) times daily.   CENTRUM PO Take 1 tablet by mouth daily.   fluticasone-salmeterol 250-50 MCG/ACT Aepb Commonly known as: ADVAIR inhale ONE PUFF by MOUTH every 12 hours. <<rinse MOUTH AFTER use>>   furosemide 20 MG  tablet Commonly known as: LASIX TAKE ONE TABLET BY MOUTH ONCE DAILY   hydrOXYzine 50 MG tablet Commonly known as: ATARAX Take 50 mg by mouth daily as needed for anxiety.   insulin aspart 100 UNIT/ML FlexTouch Pen Commonly known as: FIASP Inject 10-16 Units into the skin with breakfast, with lunch, and with evening meal.   levETIRAcetam 500 MG tablet Commonly known as: KEPPRA TAKE THREE TABLETS BY MOUTH TWICE DAILY   Mesalamine 400 MG Cpdr DR capsule Commonly known as: ASACOL TAKE ONE CAPSULE BY MOUTH FOUR TIMES DAILY   morphine 30 MG 12 hr tablet Commonly known as: MS CONTIN Take 1 tablet (30 mg total) by mouth 2 (two) times daily. May fill 60 days after 09/30/2017   ondansetron 8 MG disintegrating tablet Commonly known as: ZOFRAN-ODT DISSOLVE ONE TABLET BY MOUTH EVERY 8 HOURS AS NEEDED FOR NAUSEA AND VOMITING   Oxycodone HCl 10 MG Tabs Take 1 tablet by mouth 4 times a day as needed for pain. Do not drive or operate machinery while on this medicine. What changed:  how much to take how to take this when to take this reasons to take this additional instructions   Pen Needles 31G X 8 MM Misc Use to inject insulin 4 times daily   polyethylene glycol 17 g packet Commonly known as: MiraLax Take 17 g by mouth daily.   QUEtiapine 100 MG tablet Commonly known as: SEROQUEL Take 100 mg by mouth at bedtime.   sodium bicarbonate 650 MG tablet TAKE 1 TABLET BY MOUTH TWICE DAILY   tamsulosin 0.4 MG Caps capsule Commonly known as: FLOMAX Take 1 capsule (0.4 mg total) by mouth 2 (two) times daily.   traZODone 100 MG tablet Commonly known as: DESYREL Take 100 mg by mouth at bedtime.   Evaristo Bury FlexTouch 200 UNIT/ML FlexTouch Pen Generic drug: insulin degludec Inject 60 Units into the skin at bedtime.   True Metrix Blood Glucose Test test strip Generic drug: glucose blood check blood sugar FOUR TIMES DAILY   TRUEplus Insulin Syringe 31G X 5/16" 1 ML Misc Generic drug:  Insulin Syringe-Needle U-100 AS DIRECTED   BD Veo Insulin Syringe U/F 31G X 15/64" 1 ML Misc Generic drug: Insulin Syringe-Needle U-100 AS DIRECTED   venlafaxine XR 37.5 MG 24 hr capsule Commonly known as: EFFEXOR-XR Take 1 capsule (37.5 mg total) by mouth daily.        Allergies:  Allergies  Allergen Reactions   Gabapentin Other (See Comments)    Increased blood sugar    Family History: Family History  Problem Relation Age of Onset   Hypertension Father    Diabetes Father    Heart disease Father    Parkinson's disease  Father    Hypertension Sister    Heart disease Brother        both brothers have heart disease   Heart disease Maternal Grandfather    Lupus Daughter    Neurofibromatosis Son    Parkinson's disease Paternal Aunt    Parkinson's disease Paternal Uncle    Heart attack Other     Social History:  reports that he has been smoking cigarettes. He has a 35 pack-year smoking history. He has been exposed to tobacco smoke. He has never used smokeless tobacco. He reports that he does not drink alcohol and does not use drugs.  ROS: All other review of systems were reviewed and are negative except what is noted above in HPI  Physical Exam: BP (!) 142/83   Pulse (!) 114   Constitutional:  Alert and oriented, No acute distress. HEENT: [place] AT, moist mucus membranes.  Trachea midline, no masses. Cardiovascular: No clubbing, cyanosis, or edema. Respiratory: Normal respiratory effort, no increased work of breathing. GI: Abdomen is soft, nontender, nondistended, no abdominal masses GU: No CVA tenderness.  Lymph: No cervical or inguinal lymphadenopathy. Skin: No rashes, bruises or suspicious lesions. Neurologic: Grossly intact, no focal deficits, moving all 4 extremities. Psychiatric: Normal mood and affect.  Laboratory Data: Lab Results  Component Value Date   WBC 8.6 05/12/2023   HGB 14.4 05/12/2023   HCT 45.0 05/12/2023   MCV 90.4 05/12/2023   PLT 277  05/12/2023    Lab Results  Component Value Date   CREATININE 1.49 (H) 05/12/2023    No results found for: "PSA"  No results found for: "TESTOSTERONE"  Lab Results  Component Value Date   HGBA1C 8.5 (A) 07/15/2023    Urinalysis    Component Value Date/Time   COLORURINE YELLOW 11/15/2022 0802   APPEARANCEUR Clear 03/26/2023 1310   LABSPEC 1.019 11/15/2022 0802   PHURINE 6.0 11/15/2022 0802   GLUCOSEU Negative 03/26/2023 1310   HGBUR NEGATIVE 11/15/2022 0802   BILIRUBINUR Negative 03/26/2023 1310   KETONESUR NEGATIVE 11/15/2022 0802   PROTEINUR Trace 03/26/2023 1310   PROTEINUR NEGATIVE 11/15/2022 0802   UROBILINOGEN 0.2 12/21/2014 0845   NITRITE Negative 03/26/2023 1310   NITRITE NEGATIVE 11/15/2022 0802   LEUKOCYTESUR 1+ (A) 03/26/2023 1310   LEUKOCYTESUR NEGATIVE 11/15/2022 0802    Lab Results  Component Value Date   LABMICR <3.0 07/04/2023   WBCUA >30 (A) 03/26/2023   LABEPIT 0-10 03/26/2023   MUCUS Present 07/25/2020   BACTERIA Few (A) 03/26/2023    Pertinent Imaging: Renal US 09/16/2023: Images reviewed and discussed with the patient  Results for orders placed in visit on 12/31/22  DG Abd 1 View  Narrative CLINICAL DATA:  Kidney stone.  EXAM: ABDOMEN - 1 VIEW  COMPARISON:  09/20/2022.  FINDINGS: The bowel gas pattern is normal. No radio-opaque calculi or other significant radiographic abnormality are seen.  IMPRESSION: Negative.   Electronically Signed By: Layla Maw M.D. On: 01/05/2023 18:16  No results found for this or any previous visit.  No results found for this or any previous visit.  No results found for this or any previous visit.  Results for orders placed during the hospital encounter of 12/18/20  Ultrasound renal complete  Narrative CLINICAL DATA:  Nephrolithiasis.  EXAM: RENAL / URINARY TRACT ULTRASOUND COMPLETE  COMPARISON:  September 14, 2020.  FINDINGS: Right Kidney:  Renal measurements: 11.9 x 6.0 x 6.3 cm  = volume: 239 mL. Probable 12 mm nonobstructive calculus seen in midpole collecting  system. Echogenicity within normal limits. No mass or hydronephrosis visualized.  Left Kidney:  Renal measurements: 13.4 x 5.7 x 5.6 cm = volume: 224 mL. Probable nonobstructive 7 mm calculus seen in midpole collecting system. Echogenicity within normal limits. No mass or hydronephrosis visualized.  Bladder:  Appears normal for degree of bladder distention. Bilateral ureteral jets are noted.  Other:  None.  IMPRESSION: Probable bilateral nonobstructive nephrolithiasis. No hydronephrosis or renal obstruction is noted.   Electronically Signed By: Lupita Raider M.D. On: 12/18/2020 13:39  No results found for this or any previous visit.  No results found for this or any previous visit.  Results for orders placed in visit on 09/11/22  CT RENAL STONE STUDY  Narrative CLINICAL DATA:  Bilateral flank pain, history of calculi  EXAM: CT ABDOMEN AND PELVIS WITHOUT CONTRAST  TECHNIQUE: Multidetector CT imaging of the abdomen and pelvis was performed following the standard protocol without IV contrast.  RADIATION DOSE REDUCTION: This exam was performed according to the departmental dose-optimization program which includes automated exposure control, adjustment of the mA and/or kV according to patient size and/or use of iterative reconstruction technique.  COMPARISON:  09/14/2020 and radiographs from 12/26/2021  FINDINGS: Lower chest: Unremarkable  Hepatobiliary: Unremarkable  Pancreas: Unremarkable  Spleen: Unremarkable  Adrenals/Urinary Tract: Stable 3.7 by 4.1 cm right adrenal myelolipoma, image 19 series 2. No signs of interval hematoma. This lesion is benign and does not require further imaging workup.  Cluster calculi in the right kidney lower pole measuring up to 1.1 cm in long axis. There is also a 2 mm right kidney upper pole nonobstructive renal calculus.  Two 2 mm  left renal calculi are present.  No hydronephrosis or hydroureter.  No ureteral or bladder calculus.  Stomach/Bowel: Unremarkable.  Normal appendix.  Vascular/Lymphatic: Atherosclerosis is present, including aortoiliac atherosclerotic disease.  Reproductive: Borderline prostatomegaly.  Other: No supplemental non-categorized findings.  Musculoskeletal: Mild lumbar spondylosis and degenerative disc disease.  IMPRESSION: 1. Bilateral nonobstructive nephrolithiasis. 2. Stable 4.1 cm right adrenal myelolipoma. This lesion is benign and does not require further imaging workup. 3. Borderline prostatomegaly. 4. Mild lumbar spondylosis and degenerative disc disease. 5. Aortic atherosclerosis.  Aortic Atherosclerosis (ICD10-I70.0).   Electronically Signed By: Gaylyn Rong M.D. On: 09/11/2022 15:04   Assessment & Plan:    1. Nephrolithiasis (Primary) Decrease sodium bicarb to 650mg  daily Followup 6 months with renal US - Urinalysis, Routine w reflex microscopic - BLADDER SCAN AMB NON-IMAGING  2. Urinary retention Continue flomax 0.4mg  BID   No follow-ups on file.  Wilkie Aye, MD  [facility] [place]

## 2023-09-25 LAB — URINALYSIS, ROUTINE W REFLEX MICROSCOPIC
Bilirubin, UA: NEGATIVE
Glucose, UA: NEGATIVE
Ketones, UA: NEGATIVE
Nitrite, UA: NEGATIVE
Protein,UA: NEGATIVE
RBC, UA: NEGATIVE
Specific Gravity, UA: 1.02 (ref 1.005–1.030)
Urobilinogen, Ur: 0.2 mg/dL (ref 0.2–1.0)
pH, UA: 5.5 (ref 5.0–7.5)

## 2023-09-25 LAB — MICROSCOPIC EXAMINATION: Bacteria, UA: NONE SEEN

## 2023-09-30 ENCOUNTER — Encounter: Payer: Self-pay | Admitting: Urology

## 2023-09-30 NOTE — Patient Instructions (Signed)

## 2023-10-15 ENCOUNTER — Telehealth: Payer: Self-pay | Admitting: Pharmacy Technician

## 2023-10-15 ENCOUNTER — Other Ambulatory Visit (HOSPITAL_COMMUNITY): Payer: Self-pay

## 2023-10-15 NOTE — Telephone Encounter (Signed)
 Pharmacy Patient Advocate Encounter   Received notification from CoverMyMeds that prior authorization for Fiasp FlexTouch 100UNIT/ML pen-injectors is required/requested.   Insurance verification completed.   The patient is insured through  [facility]  .   Per test claim:  NOVOLOG is preferred by the insurance.  If suggested medication is appropriate, Please send in a new RX and discontinue this one. If not, please advise as to why it's not appropriate so that we may request a Prior Authorization. Please note, some preferred medications may still require a PA.  If the suggested medications have not been trialed and there are no contraindications to their use, the PA will not be submitted, as it will not be approved.

## 2023-11-07 ENCOUNTER — Other Ambulatory Visit: Payer: Self-pay | Admitting: Nurse Practitioner

## 2023-11-07 DIAGNOSIS — N183 Chronic kidney disease, stage 3 unspecified: Secondary | ICD-10-CM

## 2023-11-10 ENCOUNTER — Encounter (INDEPENDENT_AMBULATORY_CARE_PROVIDER_SITE_OTHER): Payer: Self-pay | Admitting: Gastroenterology

## 2023-11-10 ENCOUNTER — Ambulatory Visit (INDEPENDENT_AMBULATORY_CARE_PROVIDER_SITE_OTHER): Payer: Medicare Other | Admitting: Gastroenterology

## 2023-11-10 VITALS — BP 135/84 | HR 108 | Temp 98.1°F | Ht 67.0 in | Wt 270.9 lb

## 2023-11-10 DIAGNOSIS — K5190 Ulcerative colitis, unspecified, without complications: Secondary | ICD-10-CM

## 2023-11-10 DIAGNOSIS — K5909 Other constipation: Secondary | ICD-10-CM | POA: Diagnosis not present

## 2023-11-10 DIAGNOSIS — K51919 Ulcerative colitis, unspecified with unspecified complications: Secondary | ICD-10-CM

## 2023-11-10 DIAGNOSIS — Z79899 Other long term (current) drug therapy: Secondary | ICD-10-CM

## 2023-11-10 NOTE — Patient Instructions (Signed)
 Continue mesalamine  at 1600mg  daily Continue miralax  and stool softeners We will get you scheduled for colonoscopy and update metabolic panel blood work  Follow up 6 months  It was a pleasure to see you today. I want to create trusting relationships with patients and provide genuine, compassionate, and quality care. I truly value your feedback! please be on the lookout for a survey regarding your visit with me today. I appreciate your input about our visit and your time in completing this!    Allyiah Gartner L. Scorpio Fortin, MSN, APRN, AGNP-C Adult-Gerontology Nurse Practitioner [facility]

## 2023-11-10 NOTE — Progress Notes (Signed)
 Referring Provider: Urwin, Jayce G, DO Primary Care Physician:  Wassink, Jayce G, DO Primary GI Physician: Dr. Sammi Crick   Chief Complaint  Patient presents with   Ulcerative Colitis    Follow up on UC. States no concerns. Taking mesalamine . Wanted to get colonoscopy scheduled.    HPI:   Shaun Harris is a 58 y.o. male with past medical history of  UC, stroke, anxiety, COPD, urolithiasis, DM, HLD, HTN, OSA   Patient presenting today for:  Follow up of Ulcerative colitis  Last seen October 2024, at that time doing okay from GI standpoint, 3-4 BMs per day, doing miralax  and stool softeners. No abdominal pain, rectal bleeding or melena. Still having workup for kidneys. Renal function improved some.   Recommended CBC, CMP, continue mesalamine  1.6g daily, schedule colonoscopy for janaury  CBC  in march with hgb 12.1, CMP in October grossly unremarkable other than creat 1.49, creatnine 1.7 in March   Present: Doing okay GI wise. Feels things are going well. Having 3-4 BMs per day doing miralax  and stool softeners. He has no abdominal pain, rectal bleeding or melena. Occasionally will have a harder stool and will increase his miralax . No episodes of diarrhea. Appetite is good. No weight loss   States he is still undergoing evaluation for renal issues. Having frequent renal US  for evaluation. No recent kidney stones.   Extraintestinal Manifestations: Skin: had a boil near R elbow, healing, no other skin lesions or rashes Joints: none  Eyes: no vision changes  Flu shot: 2025 Pna: a few years back Covid: none Shingles vaccines: none    Last Colonoscopy:09/2016 Exam performed to the cecum. No evidence of active colitis. Patchy edema at the sigmoid colon and rectum.  Biopsies taken. Diagnosis: colon ascending colon: focal active acute inflammation. Colon descending: Mild increase in chronic inflammation. Colon sigmoid and rectum: chronic changes changes but significant inflammation.  Hyperplastic pol Filed Weights   11/10/23 1046  Weight: 270 lb 14.4 oz (122.9 kg)     Past Medical History:  Diagnosis Date   Anxiety    Arthritis    Cerebral vasculitis    Chronic headaches    constant since possible stroke in 1997,"Vasculitis of brain with increased pressure of brain ".   COPD (chronic obstructive pulmonary disease) (HCC)    COPD (chronic obstructive pulmonary disease) (HCC)    Diabetes mellitus    Hyperlipidemia    Hypertension    Hypertriglyceridemia    Sleep apnea    has but doesnt use, cannot tolerate; PCP aware   Stroke [facility])    pt states,"they arent sure if i had a stroke in 1997, there is still a question about that with my doctors". No deficits.   Tobacco abuse    Ulcerative colitis (HCC)    Ulcerative colitis (HCC)    Venous stasis     Past Surgical History:  Procedure Laterality Date   BIOPSY  10/25/2016   Procedure: BIOPSY;  Surgeon: Ruby Corporal, MD;  [facility];  Service: Endoscopy;;  colon   CERVICAL SPINE SURGERY  2008   x2-last surgery 04/24/2016.   COLONOSCOPY WITH PROPOFOL  N/A 10/25/2016   Procedure: COLONOSCOPY WITH PROPOFOL ;  Surgeon: Ruby Corporal, MD;  Location: [facility];  Service: Endoscopy;  Laterality: N/A;  7:30   EXTRACORPOREAL SHOCK WAVE LITHOTRIPSY Left 08/08/2020   Procedure: EXTRACORPOREAL SHOCK WAVE LITHOTRIPSY (ESWL);  Surgeon: Marco Severs, MD;  Location: [facility];  Service: Urology;  Laterality: Left;  KNEE ARTHROSCOPY  02/25/2012   Procedure: ARTHROSCOPY KNEE;  Surgeon: Pleasant Brilliant, MD;  Location: [facility];  Service: Orthopedics;  Laterality: Right;   KNEE ARTHROSCOPY WITH MEDIAL MENISECTOMY Left 12/23/2014   Procedure: LEFT KNEE ARTHROSCOPY WITH PARTIAL MENISECTOMY;  Surgeon: Pleasant Brilliant, MD;  Location: [facility];  Service: Orthopedics;  Laterality: Left;   POLYPECTOMY  10/25/2016   Procedure: POLYPECTOMY;  Surgeon: Ruby Corporal, MD;  Location: [facility];  Service: Endoscopy;;  colon    SHOULDER ARTHROSCOPY  2004   left shoulder   SHOULDER ARTHROSCOPY  2008   right shoulder    Current Outpatient Medications  Medication Sig Dispense Refill   acetaminophen  (TYLENOL ) 500 MG tablet Take 1,000 mg by mouth every 6 (six) hours as needed for moderate pain.     AIMOVIG 70 MG/ML SOAJ Inject 70 mg into the skin every 30 (thirty) days.     albuterol  (VENTOLIN  HFA) 108 (90 Base) MCG/ACT inhaler inhale TWO puffs by MOUTH every SIX hours as needed for shortness of breath 18 g 5   ALPRAZolam  (XANAX ) 0.5 MG tablet Take 0.5 mg by mouth 3 (three) times daily.     aspirin EC 325 MG tablet Take 325 mg by mouth at bedtime.     atorvastatin  (LIPITOR) 20 MG tablet Take 1 tablet (20 mg total) by mouth daily. 90 tablet 1   Blood Glucose Monitoring Suppl w/Device KIT Test glucose 4 times daily. E11.9 100 each 5   buPROPion  (WELLBUTRIN  SR) 150 MG 12 hr tablet Take 1 tablet (150 mg total) by mouth 2 (two) times daily. 180 tablet 1   fluticasone -salmeterol (ADVAIR) 250-50 MCG/ACT AEPB inhale ONE PUFF by MOUTH every 12 hours. <<rinse MOUTH AFTER use>> 180 each 1   furosemide  (LASIX ) 20 MG tablet TAKE ONE TABLET BY MOUTH ONCE DAILY 30 tablet 3   glucose blood (TRUE METRIX BLOOD GLUCOSE TEST) test strip check blood sugar FOUR TIMES DAILY 100 each 0   hydrOXYzine  (ATARAX /VISTARIL ) 50 MG tablet Take 50 mg by mouth daily as needed for anxiety.     insulin  aspart (FIASP ) 100 UNIT/ML FlexTouch Pen Inject 10-16 Units into the skin with breakfast, with lunch, and with evening meal. 36 mL 3   Insulin  Glargine (BASAGLAR  KWIKPEN) 100 UNIT/ML Inject 60 Units into the skin at bedtime. 60 mL 0   Insulin  Pen Needle (PEN NEEDLES) 31G X 8 MM MISC Use to inject insulin  4 times daily 300 each 3   Insulin  Syringe-Needle U-100 (BD VEO INSULIN  SYRINGE U/F) 31G X 15/64" 1 ML MISC AS DIRECTED 100 each 1   levETIRAcetam  (KEPPRA ) 500 MG tablet TAKE THREE TABLETS BY MOUTH TWICE DAILY 540 tablet 1   Mesalamine  (ASACOL ) 400 MG CPDR  DR capsule TAKE ONE CAPSULE BY MOUTH FOUR TIMES DAILY 360 capsule 1   morphine  (MS CONTIN ) 30 MG 12 hr tablet Take 1 tablet (30 mg total) by mouth 2 (two) times daily. May fill 60 days after 09/30/2017 60 tablet 0   Multiple Vitamins-Minerals (CENTRUM PO) Take 1 tablet by mouth daily.     ondansetron  (ZOFRAN -ODT) 8 MG disintegrating tablet DISSOLVE ONE TABLET BY MOUTH EVERY 8 HOURS AS NEEDED FOR NAUSEA AND VOMITING 20 tablet 6   Oxycodone  HCl 10 MG TABS Take 1 tablet by mouth 4 times a day as needed for pain. Do not drive or operate machinery while on this medicine. (Patient taking differently: Take 10 mg by mouth 4 (four) times daily as needed (pain). Do not drive or  operate machinery while on this medicine.) 120 tablet 0   polyethylene glycol (MIRALAX ) 17 g packet Take 17 g by mouth daily. 14 each 0   QUEtiapine (SEROQUEL) 100 MG tablet Take 100 mg by mouth at bedtime.     sodium bicarbonate  650 MG tablet TAKE 1 TABLET BY MOUTH TWICE DAILY 60 tablet 11   tamsulosin  (FLOMAX ) 0.4 MG CAPS capsule Take 1 capsule (0.4 mg total) by mouth 2 (two) times daily. 180 capsule 3   traZODone (DESYREL) 100 MG tablet Take 100 mg by mouth at bedtime.     TRUEPLUS INSULIN  SYRINGE 31G X 5/16" 1 ML MISC AS DIRECTED 100 each 0   venlafaxine  XR (EFFEXOR -XR) 37.5 MG 24 hr capsule Take 1 capsule (37.5 mg total) by mouth daily. 90 capsule 3   vitamin C (ASCORBIC ACID) 500 MG tablet Take 500 mg by mouth daily.     No current facility-administered medications for this visit.    Allergies as of 11/10/2023 - Review Complete 11/10/2023  Allergen Reaction Noted   Gabapentin Other (See Comments) 03/21/2016    Social History   Socioeconomic History   Marital status: Widowed    Spouse name: Not on file   Number of children: Not on file   Years of education: Not on file   Highest education level: Not on file  Occupational History   Not on file  Tobacco Use   Smoking status: Every Day    Current packs/day: 1.00     Average packs/day: 1 pack/day for 35.0 years (35.0 ttl pk-yrs)    Types: Cigarettes    Passive exposure: Current   Smokeless tobacco: Never  Vaping Use   Vaping status: Never Used  Substance and Sexual Activity   Alcohol use: No   Drug use: No   Sexual activity: Yes    Birth control/protection: None  Other Topics Concern   Not on file  Social History Narrative   Disabled   Right handed   Social Drivers of Health   Financial Resource Strain: Low Risk  (03/14/2023)   Overall Financial Resource Strain (CARDIA)    Difficulty of Paying Living Expenses: Not hard at all  Food Insecurity: No Food Insecurity (03/14/2023)   Hunger Vital Sign    Worried About Running Out of Food in the Last Year: Never true    Ran Out of Food in the Last Year: Never true  Transportation Needs: No Transportation Needs (03/14/2023)   PRAPARE - Administrator, Civil Service (Medical): No    Lack of Transportation (Non-Medical): No  Physical Activity: Insufficiently Active (03/14/2023)   Exercise Vital Sign    Days of Exercise per Week: 7 days    Minutes of Exercise per Session: 20 min  Stress: No Stress Concern Present (03/14/2023)   [employer] of Occupational Health - Occupational Stress Questionnaire    Feeling of Stress : Not at all  Social Connections: Moderately Isolated (03/14/2023)   Social Connection and Isolation Panel [NHANES]    Frequency of Communication with Friends and Family: More than three times a week    Frequency of Social Gatherings with Friends and Family: More than three times a week    Attends Religious Services: 1 to 4 times per year    Active Member of [employer] or Organizations: No    Attends Banker Meetings: Never    Marital Status: Widowed    Review of systems General: negative for malaise, night sweats, fever, chills, weight loss Neck:  Negative for lumps, goiter, pain and significant neck swelling Resp: Negative for cough, wheezing, dyspnea at  rest CV: Negative for chest pain, leg swelling, palpitations, orthopnea GI: denies melena, hematochezia, nausea, vomiting, diarrhea, constipation, dysphagia, odyonophagia, early satiety or unintentional weight loss.  MSK: Negative for joint pain or swelling, back pain, and muscle pain. Derm: Negative for itching or rash The remainder of the review of systems is noncontributory.  Physical Exam: BP 135/84   Pulse (!) 108   Temp 98.1 F (36.7 C)   Ht 5\' 7"  (1.702 m)   Wt 270 lb 14.4 oz (122.9 kg)   BMI 42.43 kg/m  General:   Alert and oriented. No distress noted. Pleasant and cooperative.  Head:  Normocephalic and atraumatic. Eyes:  Conjuctiva clear without scleral icterus. Mouth:  Oral mucosa pink and moist. Good dentition. No lesions. Heart: Normal rate and rhythm, s1 and s2 heart sounds present.  Lungs: Clear lung sounds in all lobes. Respirations equal and unlabored. Abdomen:  +BS, soft, non-tender and non-distended. No rebound or guarding. No HSM or masses noted. Derm: No palmar erythema or jaundice Msk:  Symmetrical without gross deformities. Normal posture. Extremities:  Without edema. Neurologic:  Alert and  oriented x4 Psych:  Alert and cooperative. Normal mood and affect.  Invalid input(s): "6 MONTHS"   ASSESSMENT: Shaun Harris is a 58 y.o. male presenting today for follow up of UC  appears to be doing well clinically from UC standpoint, maintained on mesalamine  1.6 g daily.  Has chronic constipation, likely secondary to ongoing opiate use though using MiraLAX  and stool softener and having 3-4 BMs per day.  Denies any abdominal pain, rectal bleeding, melena.  Last colonoscopy was in 2018, he was scheduled for colonoscopy March 2024 though this was canceled due to rectal pain secondary to thrombosed hemorrhoid which he had I&D in March 2024 by general surgery.  Unfortunately he has had some issues with nephrolithiasis, worsening kidney function and elevated potassium which  delayed rescheduling repeat colonoscopy. There was previous Concern for possible interstitial nephritis secondary to mesalamine , though UA with sediment with WBCs, did not reveal any casts.  Continues to follow with nephrology and now urology regarding renal issues. We will continue with mesalamine  1.6 g daily, update CMP today, as he had recent CBC. discussed scheduling colonoscopy with him which he is amenable to getting set up. Indications, risks and benefits of procedure discussed in detail with patient. Patient verbalized understanding and is in agreement to proceed with Colonoscopy    PLAN:  -schedule colonoscopy ASA III  -CMP  -continue with miralax  and stool softeners -continue mesalamine  1.6g Daily   All questions were answered, patient verbalized understanding and is in agreement with plan as outlined above.   Follow Up: 6 months   Aloise Copus L. Adrien Alberta, MSN, APRN, AGNP-C Adult-Gerontology Nurse Practitioner [facility]  I have reviewed the note and agree with the APP's assessment as described in this progress note  Samantha Cress, MD Gastroenterology and Hepatology [facility]

## 2023-11-11 LAB — COMPREHENSIVE METABOLIC PANEL WITH GFR
AG Ratio: 1.8 (calc) (ref 1.0–2.5)
ALT: 13 U/L (ref 9–46)
AST: 15 U/L (ref 10–35)
Albumin: 4.4 g/dL (ref 3.6–5.1)
Alkaline phosphatase (APISO): 110 U/L (ref 35–144)
BUN/Creatinine Ratio: 16 (calc) (ref 6–22)
BUN: 24 mg/dL (ref 7–25)
CO2: 26 mmol/L (ref 20–32)
Calcium: 9.3 mg/dL (ref 8.6–10.3)
Chloride: 106 mmol/L (ref 98–110)
Creat: 1.48 mg/dL — ABNORMAL HIGH (ref 0.70–1.30)
Globulin: 2.5 g/dL (ref 1.9–3.7)
Glucose, Bld: 158 mg/dL — ABNORMAL HIGH (ref 65–99)
Potassium: 4.6 mmol/L (ref 3.5–5.3)
Sodium: 143 mmol/L (ref 135–146)
Total Bilirubin: 0.3 mg/dL (ref 0.2–1.2)
Total Protein: 6.9 g/dL (ref 6.1–8.1)
eGFR: 55 mL/min/{1.73_m2} — ABNORMAL LOW (ref >=60)

## 2023-11-13 ENCOUNTER — Encounter: Payer: Self-pay | Admitting: Nurse Practitioner

## 2023-11-13 ENCOUNTER — Ambulatory Visit (INDEPENDENT_AMBULATORY_CARE_PROVIDER_SITE_OTHER): Payer: Medicare Other | Admitting: Nurse Practitioner

## 2023-11-13 VITALS — BP 128/88 | HR 101 | Ht 67.0 in | Wt 268.8 lb

## 2023-11-13 DIAGNOSIS — N183 Chronic kidney disease, stage 3 unspecified: Secondary | ICD-10-CM | POA: Diagnosis not present

## 2023-11-13 DIAGNOSIS — Z794 Long term (current) use of insulin: Secondary | ICD-10-CM

## 2023-11-13 DIAGNOSIS — E1122 Type 2 diabetes mellitus with diabetic chronic kidney disease: Secondary | ICD-10-CM | POA: Diagnosis not present

## 2023-11-13 DIAGNOSIS — F17200 Nicotine dependence, unspecified, uncomplicated: Secondary | ICD-10-CM | POA: Diagnosis not present

## 2023-11-13 LAB — POCT GLYCOSYLATED HEMOGLOBIN (HGB A1C): Hemoglobin A1C: 8.3 % — AB (ref 4.0–5.6)

## 2023-11-13 MED ORDER — INSULIN ASPART (W/NIACINAMIDE) 100 UNIT/ML ~~LOC~~ SOPN
10.0000 [IU] | PEN_INJECTOR | Freq: Three times a day (TID) | SUBCUTANEOUS | 3 refills | Status: DC
Start: 2023-11-13 — End: 2024-03-16

## 2023-11-13 NOTE — Progress Notes (Signed)
 Endocrinology Follow Up Note       11/13/2023, 8:23 AM   Subjective:    Patient ID: Shaun Harris, male    DOB: Dec 02, 1965.  Shaun Harris is being seen in follow up after being seen in consultation for management of currently uncontrolled symptomatic diabetes requested by  Kercheval, Jayce G, DO.   Past Medical History:  Diagnosis Date   Anxiety    Arthritis    Cerebral vasculitis    Chronic headaches    constant since possible stroke in 1997,"Vasculitis of brain with increased pressure of brain ".   COPD (chronic obstructive pulmonary disease) (HCC)    COPD (chronic obstructive pulmonary disease) (HCC)    Diabetes mellitus    Hyperlipidemia    Hypertension    Hypertriglyceridemia    Sleep apnea    has but doesnt use, cannot tolerate; PCP aware   Stroke [facility])    pt states,"they arent sure if i had a stroke in 1997, there is still a question about that with my doctors". No deficits.   Tobacco abuse    Ulcerative colitis (HCC)    Ulcerative colitis (HCC)    Venous stasis     Past Surgical History:  Procedure Laterality Date   BIOPSY  10/25/2016   Procedure: BIOPSY;  Surgeon: Ruby Corporal, MD;  [facility];  Service: Endoscopy;;  colon   CERVICAL SPINE SURGERY  2008   x2-last surgery 04/24/2016.   COLONOSCOPY WITH PROPOFOL  N/A 10/25/2016   Procedure: COLONOSCOPY WITH PROPOFOL ;  Surgeon: Ruby Corporal, MD;  Location: [facility];  Service: Endoscopy;  Laterality: N/A;  7:30   EXTRACORPOREAL SHOCK WAVE LITHOTRIPSY Left 08/08/2020   Procedure: EXTRACORPOREAL SHOCK WAVE LITHOTRIPSY (ESWL);  Surgeon: Marco Severs, MD;  Location: [facility];  Service: Urology;  Laterality: Left;   KNEE ARTHROSCOPY  02/25/2012   Procedure: ARTHROSCOPY KNEE;  Surgeon: Pleasant Brilliant, MD;  Location: [facility];  Service: Orthopedics;  Laterality: Right;   KNEE ARTHROSCOPY WITH MEDIAL MENISECTOMY Left 12/23/2014    Procedure: LEFT KNEE ARTHROSCOPY WITH PARTIAL MENISECTOMY;  Surgeon: Pleasant Brilliant, MD;  Location: [facility];  Service: Orthopedics;  Laterality: Left;   POLYPECTOMY  10/25/2016   Procedure: POLYPECTOMY;  Surgeon: Ruby Corporal, MD;  [facility];  Service: Endoscopy;;  colon   SHOULDER ARTHROSCOPY  2004   left shoulder   SHOULDER ARTHROSCOPY  2008   right shoulder    Social History   Socioeconomic History   Marital status: Widowed    Spouse name: Not on file   Number of children: Not on file   Years of education: Not on file   Highest education level: Not on file  Occupational History   Not on file  Tobacco Use   Smoking status: Every Day    Current packs/day: 1.00    Average packs/day: 1 pack/day for 35.0 years (35.0 ttl pk-yrs)    Types: Cigarettes    Passive exposure: Current   Smokeless tobacco: Never  Vaping Use   Vaping status: Never Used  Substance and Sexual Activity   Alcohol use: No   Drug use: No   Sexual activity: Yes  Birth control/protection: None  Other Topics Concern   Not on file  Social History Narrative   Disabled   Right handed   Social Drivers of Health   Financial Resource Strain: Low Risk  (03/14/2023)   Overall Financial Resource Strain (CARDIA)    Difficulty of Paying Living Expenses: Not hard at all  Food Insecurity: No Food Insecurity (03/14/2023)   Hunger Vital Sign    Worried About Running Out of Food in the Last Year: Never true    Ran Out of Food in the Last Year: Never true  Transportation Needs: No Transportation Needs (03/14/2023)   PRAPARE - Administrator, Civil Service (Medical): No    Lack of Transportation (Non-Medical): No  Physical Activity: Insufficiently Active (03/14/2023)   Exercise Vital Sign    Days of Exercise per Week: 7 days    Minutes of Exercise per Session: 20 min  Stress: No Stress Concern Present (03/14/2023)   [employer] of Occupational Health - Occupational Stress Questionnaire     Feeling of Stress : Not at all  Social Connections: Moderately Isolated (03/14/2023)   Social Connection and Isolation Panel [NHANES]    Frequency of Communication with Friends and Family: More than three times a week    Frequency of Social Gatherings with Friends and Family: More than three times a week    Attends Religious Services: 1 to 4 times per year    Active Member of [employer] or Organizations: No    Attends Banker Meetings: Never    Marital Status: Widowed    Family History  Problem Relation Age of Onset   Hypertension Father    Diabetes Father    Heart disease Father    Parkinson's disease Father    Hypertension Sister    Heart disease Brother        both brothers have heart disease   Heart disease Maternal Grandfather    Lupus Daughter    Neurofibromatosis Son    Parkinson's disease Paternal Aunt    Parkinson's disease Paternal Uncle    Heart attack Other     Outpatient Encounter Medications as of 11/13/2023  Medication Sig   acetaminophen  (TYLENOL ) 500 MG tablet Take 1,000 mg by mouth every 6 (six) hours as needed for moderate pain.   AIMOVIG 70 MG/ML SOAJ Inject 70 mg into the skin every 30 (thirty) days.   albuterol  (VENTOLIN  HFA) 108 (90 Base) MCG/ACT inhaler inhale TWO puffs by MOUTH every SIX hours as needed for shortness of breath   ALPRAZolam  (XANAX ) 0.5 MG tablet Take 0.5 mg by mouth 3 (three) times daily.   aspirin EC 325 MG tablet Take 325 mg by mouth at bedtime.   atorvastatin  (LIPITOR) 20 MG tablet Take 1 tablet (20 mg total) by mouth daily.   Blood Glucose Monitoring Suppl w/Device KIT Test glucose 4 times daily. E11.9   buPROPion  (WELLBUTRIN  SR) 150 MG 12 hr tablet Take 1 tablet (150 mg total) by mouth 2 (two) times daily.   fluticasone -salmeterol (ADVAIR) 250-50 MCG/ACT AEPB inhale ONE PUFF by MOUTH every 12 hours. <<rinse MOUTH AFTER use>>   furosemide  (LASIX ) 20 MG tablet TAKE ONE TABLET BY MOUTH ONCE DAILY   glucose blood (TRUE METRIX  BLOOD GLUCOSE TEST) test strip check blood sugar FOUR TIMES DAILY   hydrOXYzine  (ATARAX /VISTARIL ) 50 MG tablet Take 50 mg by mouth daily as needed for anxiety.   Insulin  Glargine (BASAGLAR  KWIKPEN) 100 UNIT/ML Inject 60 Units into the skin at bedtime.  Insulin  Pen Needle (PEN NEEDLES) 31G X 8 MM MISC Use to inject insulin  4 times daily   Insulin  Syringe-Needle U-100 (BD VEO INSULIN  SYRINGE U/F) 31G X 15/64" 1 ML MISC AS DIRECTED   levETIRAcetam  (KEPPRA ) 500 MG tablet TAKE THREE TABLETS BY MOUTH TWICE DAILY   Mesalamine  (ASACOL ) 400 MG CPDR DR capsule TAKE ONE CAPSULE BY MOUTH FOUR TIMES DAILY   morphine  (MS CONTIN ) 30 MG 12 hr tablet Take 1 tablet (30 mg total) by mouth 2 (two) times daily. May fill 60 days after 09/30/2017   Multiple Vitamins-Minerals (CENTRUM PO) Take 1 tablet by mouth daily.   ondansetron  (ZOFRAN -ODT) 8 MG disintegrating tablet DISSOLVE ONE TABLET BY MOUTH EVERY 8 HOURS AS NEEDED FOR NAUSEA AND VOMITING   Oxycodone  HCl 10 MG TABS Take 1 tablet by mouth 4 times a day as needed for pain. Do not drive or operate machinery while on this medicine. (Patient taking differently: Take 10 mg by mouth 4 (four) times daily as needed (pain). Do not drive or operate machinery while on this medicine.)   polyethylene glycol (MIRALAX ) 17 g packet Take 17 g by mouth daily.   QUEtiapine (SEROQUEL) 100 MG tablet Take 100 mg by mouth at bedtime.   sodium bicarbonate  650 MG tablet TAKE 1 TABLET BY MOUTH TWICE DAILY   tamsulosin  (FLOMAX ) 0.4 MG CAPS capsule Take 1 capsule (0.4 mg total) by mouth 2 (two) times daily.   traZODone (DESYREL) 100 MG tablet Take 100 mg by mouth at bedtime.   TRUEPLUS INSULIN  SYRINGE 31G X 5/16" 1 ML MISC AS DIRECTED   venlafaxine  XR (EFFEXOR -XR) 37.5 MG 24 hr capsule Take 1 capsule (37.5 mg total) by mouth daily.   vitamin C (ASCORBIC ACID) 500 MG tablet Take 500 mg by mouth daily.   [DISCONTINUED] insulin  aspart (FIASP ) 100 UNIT/ML FlexTouch Pen Inject 10-16 Units into  the skin with breakfast, with lunch, and with evening meal.   insulin  aspart (FIASP ) 100 UNIT/ML FlexTouch Pen Inject 10-16 Units into the skin with breakfast, with lunch, and with evening meal.   No facility-administered encounter medications on file as of 11/13/2023.    ALLERGIES: Allergies  Allergen Reactions   Gabapentin Other (See Comments)    Increased blood sugar    VACCINATION STATUS: Immunization History  Administered Date(s) Administered   Influenza Split 04/01/2013   Influenza, Seasonal, Injecte, Preservative Fre 04/04/2023   Influenza,inj,Quad PF,6+ Mos 03/30/2014, 03/22/2015, 03/21/2016, 04/07/2017, 03/30/2018, 07/02/2021, 07/03/2022   Pneumococcal Polysaccharide-23 07/02/2018   Pneumococcal-Unspecified 06/26/2004    Diabetes He presents for his follow-up diabetic visit. He has type 2 diabetes mellitus. Onset time: diagnosed at approx age of 51. His disease course has been improving. There are no hypoglycemic associated symptoms. Associated symptoms include blurred vision and fatigue. There are no hypoglycemic complications. Symptoms are stable. Diabetic complications include a CVA, heart disease, nephropathy and peripheral neuropathy. Risk factors for coronary artery disease include tobacco exposure, sedentary lifestyle, male sex, obesity, hypertension, diabetes mellitus, dyslipidemia and family history. Current diabetic treatment includes intensive insulin  program. He is compliant with treatment most of the time. His weight is increasing rapidly. He is following a generally unhealthy diet. When asked about meal planning, he reported none. He has not had a previous visit with a dietitian. He rarely participates in exercise. His home blood glucose trend is decreasing steadily. His overall blood glucose range is 140-180 mg/dl. (He presents today with his meter and logs showing improving glycemic profile.  His POCT A1c today is 8.3%,  improving from last visit of 8.5%.   Analysis of  his meter shows 7-day average of 175, 14-day average of 175, 30-day average of 165.  He is more active recently, watching his 40 month old grandson.) An ACE inhibitor/angiotensin II receptor blocker is being taken. He does not see a podiatrist (saw in distant past).Eye exam is not current.     Review of systems  Constitutional: + decreasing body weight, current Body mass index is 42.1 kg/m. , no fatigue, no subjective hyperthermia, no subjective hypothermia Eyes: no blurry vision, no xerophthalmia ENT: no sore throat, no nodules palpated in throat, no dysphagia/odynophagia, no hoarseness Cardiovascular: no chest pain, no shortness of breath, no palpitations, + leg swelling Respiratory: no cough, no shortness of breath Gastrointestinal: no nausea/vomiting/diarrhea Musculoskeletal: no muscle/joint aches Skin: no rashes, no hyperemia Neurological: no tremors, no numbness, no tingling, no dizziness Psychiatric: no depression, no anxiety  Objective:     BP 128/88 (BP Location: Left Arm, Patient Position: Sitting)   Pulse (!) 101   Ht 5\' 7"  (1.702 m)   Wt 268 lb 12.8 oz (121.9 kg)   BMI 42.10 kg/m   Wt Readings from Last 3 Encounters:  11/13/23 268 lb 12.8 oz (121.9 kg)  11/10/23 270 lb 14.4 oz (122.9 kg)  07/15/23 270 lb (122.5 kg)     BP Readings from Last 3 Encounters:  11/13/23 128/88  11/10/23 135/84  09/24/23 (!) 142/83     Physical Exam- Limited  Constitutional:  Body mass index is 42.1 kg/m. , not in acute distress, normal state of mind Eyes:  EOMI, no exophthalmos Musculoskeletal: no gross deformities, strength intact in all four extremities, no gross restriction of joint movements Skin:  no rashes, + hyperemia to BLE, nicotinic discoloration to bilateral fingertips Neurological: no tremor with outstretched hands   Diabetic Foot Exam - Simple   No data filed     CMP ( most recent) CMP     Component Value Date/Time   NA 143 11/10/2023 1131   NA 141  04/16/2023 0900   K 4.6 11/10/2023 1131   CL 106 11/10/2023 1131   CO2 26 11/10/2023 1131   GLUCOSE 158 (H) 11/10/2023 1131   BUN 24 11/10/2023 1131   BUN 26 (H) 04/16/2023 0900   CREATININE 1.48 (H) 11/10/2023 1131   CALCIUM  9.3 11/10/2023 1131   CALCIUM  9.3 08/27/2023 1140   PROT 6.9 11/10/2023 1131   PROT 6.4 10/01/2021 1004   ALBUMIN 4.4 11/15/2022 0636   ALBUMIN 4.6 10/01/2021 1004   AST 15 11/10/2023 1131   ALT 13 11/10/2023 1131   ALKPHOS 82 11/15/2022 0636   BILITOT 0.3 11/10/2023 1131   BILITOT <0.2 10/01/2021 1004   GFRNONAA 50 (L) 11/15/2022 0636   GFRAA 98 03/28/2020 0939     Diabetic Labs (most recent): Lab Results  Component Value Date   HGBA1C 8.3 (A) 11/13/2023   HGBA1C 8.5 (A) 07/15/2023   HGBA1C 6.4 (A) 03/03/2023     Lipid Panel ( most recent) Lipid Panel     Component Value Date/Time   CHOL 155 10/02/2022 1139   CHOL 142 01/01/2022 1157   TRIG 340 (H) 10/02/2022 1139   HDL 35 (L) 10/02/2022 1139   HDL 45 01/01/2022 1157   CHOLHDL 4.4 10/02/2022 1139   VLDL 68 (H) 10/02/2022 1139   LDLCALC 52 10/02/2022 1139   LDLCALC 75 01/01/2022 1157   LABVLDL 22 01/01/2022 1157      Lab Results  Component Value Date  TSH 1.57 11/19/2022           Assessment & Plan:   1) Type 2 diabetes mellitus with hyperglycemia, with long-term current use of insulin   He presents today with his meter and logs showing improving glycemic profile.  His POCT A1c today is 8.3%,  improving from last visit of 8.5%.   Analysis of his meter shows 7-day average of 175, 14-day average of 175, 30-day average of 165.  He is more active recently, watching his 66 month old grandson.  - WESSAM MUDRY has currently uncontrolled symptomatic type 2 DM since 58 years of age.   -Recent labs reviewed.  Kidney function has worsened slightly.  Will monitor on subsequent visits.  - I had a long discussion with him about the progressive nature of diabetes and the pathology behind its  complications. -his diabetes is complicated by CKD stage 3a, ?CVA, tobacco abuse and he remains at a high risk for more acute and chronic complications which include CAD, CVA, CKD, retinopathy, and neuropathy. These are all discussed in detail with him.  The following Lifestyle Medicine recommendations according to American College of Lifestyle Medicine [facility]) were discussed and offered to patient and he agrees to start the journey:  A. Whole Foods, Plant-based plate comprising of fruits and vegetables, plant-based proteins, whole-grain carbohydrates was discussed in detail with the patient.   A list for source of those nutrients were also provided to the patient.  Patient will use only water or unsweetened tea for hydration. B.  The need to stay away from risky substances including alcohol, smoking; obtaining 7 to 9 hours of restorative sleep, at least 150 minutes of moderate intensity exercise weekly, the importance of healthy social connections,  and stress reduction techniques were discussed. C.  A full color page of  Calorie density of various food groups per pound showing examples of each food groups was provided to the patient.  - Nutritional counseling repeated at each appointment due to patients tendency to fall back in to old habits.  - The patient admits there is a room for improvement in their diet and drink choices. -  Suggestion is made for the patient to avoid simple carbohydrates from their diet including Cakes, Sweet Desserts / Pastries, Ice Cream, Soda (diet and regular), Sweet Tea, Candies, Chips, Cookies, Sweet Pastries, Store Bought Juices, Alcohol in Excess of 1-2 drinks a day, Artificial Sweeteners, Coffee Creamer, and "Sugar-free" Products. This will help patient to have stable blood glucose profile and potentially avoid unintended weight gain.   - I encouraged the patient to switch to unprocessed or minimally processed complex starch and increased protein intake (animal or plant  source), fruits, and vegetables.   - Patient is advised to stick to a routine mealtimes to eat 3 meals a day and avoid unnecessary snacks (to snack only to correct hypoglycemia).  - I have approached him with the following individualized plan to manage his diabetes and patient agrees:   -He is advised to lower his Tresiba /Basaglar  (insurance pref) to 50 units SQ nightly (suspect liver dumping at night) and continue his Fiasp  10-16 units TID with meals if glucose is above 90 and he is eating (Specific instructions on how to titrate insulin  dosage based on glucose readings given to patient in writing).   He is advised to stay off the Ozempic  due to severe constipation he had previously while taking it (he is also heavy smoker which increases his risk of pancreatitis on the GLP1 products).  -  he is encouraged to continue monitoring glucose 4 times daily, before meals and before bed, and to call the clinic if he has readings less than 70 or above 300 for 3 tests in a row.  We did talk about possibility of adding CGM in future, he is not interested at this time.  - he is warned not to take insulin  without proper monitoring per orders. - Adjustment parameters are given to him for hypo and hyperglycemia in writing.  - Specific targets for  A1c; LDL, HDL, and Triglycerides were discussed with the patient.  2) Blood Pressure /Hypertension:  his blood pressure is controlled to target, tight today.   he is advised to continue his current medications including Lasix  20 mg p.o. daily with breakfast, Enalapril  20 mg po daily.  He has appt coming up with PCP and think labs will be done at that time.  3) Lipids/Hyperlipidemia:    Review of his recent lipid panel from 10/03/22 showed controlled LDL at 52 and significantly elevated triglycerides of 340 .  he is advised to continue Lipitor 20 mg daily at bedtime.  Side effects and precautions discussed with him.  We also discussed importance of low fat diet today.   He is due for lipid panel but sees his PCP next month.  4)  Weight/Diet:  his Body mass index is 42.1 kg/m.  -  clearly complicating his diabetes care.   he is a candidate for weight loss. I discussed with him the fact that loss of 5 - 10% of his  current body weight will have the most impact on his diabetes management.  Exercise, and detailed carbohydrates information provided  -  detailed on discharge instructions.  5) Chronic Care/Health Maintenance: -he is on ACEI/ARB and Statin medications and is encouraged to initiate and continue to follow up with Ophthalmology, Dentist, Podiatrist at least yearly or according to recommendations, and advised to QUIT SMOKING. I have recommended yearly flu vaccine and pneumonia vaccine at least every 5 years; moderate intensity exercise for up to 150 minutes weekly; and sleep for at least 7 hours a day.  - he is advised to maintain close follow up with Duerst, Jayce G, DO for primary care needs, as well as his other providers for optimal and coordinated care.     I spent  37  minutes in the care of the patient today including review of labs from CMP, Lipids, Thyroid  Function, Hematology (current and previous including abstractions from other facilities); face-to-face time discussing  his blood glucose readings/logs, discussing hypoglycemia and hyperglycemia episodes and symptoms, medications doses, his options of short and long term treatment based on the latest standards of care / guidelines;  discussion about incorporating lifestyle medicine;  and documenting the encounter. Risk reduction counseling performed per USPSTF guidelines to reduce obesity and cardiovascular risk factors.     Please refer to Patient Instructions for Blood Glucose Monitoring and Insulin /Medications Dosing Guide"  in media tab for additional information. Please  also refer to " Patient Self Inventory" in the Media  tab for reviewed elements of pertinent patient history.  Augustus Blood  participated in the discussions, expressed understanding, and voiced agreement with the above plans.  All questions were answered to his satisfaction. he is encouraged to contact clinic should he have any questions or concerns prior to his return visit.     Follow up plan: - Return in about 4 months (around 03/15/2024) for Diabetes F/U with A1c in office, No previsit labs,  Bring meter and logs.   Hulon Magic, [facility] [address] [place], [place] [address] Phone: [phone] Fax: [phone]  11/13/2023, 8:23 AM

## 2023-11-17 ENCOUNTER — Other Ambulatory Visit (HOSPITAL_COMMUNITY): Payer: Self-pay | Admitting: Nurse Practitioner

## 2023-11-17 DIAGNOSIS — Z122 Encounter for screening for malignant neoplasm of respiratory organs: Secondary | ICD-10-CM

## 2023-11-17 DIAGNOSIS — Z87891 Personal history of nicotine dependence: Secondary | ICD-10-CM

## 2023-11-18 ENCOUNTER — Other Ambulatory Visit: Payer: Self-pay | Admitting: Family Medicine

## 2023-11-20 ENCOUNTER — Telehealth (INDEPENDENT_AMBULATORY_CARE_PROVIDER_SITE_OTHER): Payer: Self-pay | Admitting: Gastroenterology

## 2023-11-20 NOTE — Telephone Encounter (Signed)
Left message to return call to schedule TCS

## 2023-11-27 NOTE — Telephone Encounter (Signed)
 Pt returned call. Pt scheduled. Will mail instructions once pre op is received. No pa needed for Medicare. Prep sent to pharmacy.

## 2023-11-27 NOTE — Telephone Encounter (Signed)
 Left message to return call

## 2023-12-16 ENCOUNTER — Ambulatory Visit (HOSPITAL_COMMUNITY)
Admission: RE | Admit: 2023-12-16 | Discharge: 2023-12-16 | Disposition: A | Discharge status: Home / Self Care | Source: Ambulatory Visit | Attending: Nurse Practitioner | Admitting: Nurse Practitioner

## 2023-12-16 DIAGNOSIS — Z122 Encounter for screening for malignant neoplasm of respiratory organs: Secondary | ICD-10-CM | POA: Diagnosis present

## 2023-12-16 DIAGNOSIS — Z87891 Personal history of nicotine dependence: Secondary | ICD-10-CM | POA: Insufficient documentation

## 2023-12-22 ENCOUNTER — Other Ambulatory Visit: Payer: Self-pay | Admitting: Family Medicine

## 2023-12-22 DIAGNOSIS — J449 Chronic obstructive pulmonary disease, unspecified: Secondary | ICD-10-CM

## 2023-12-23 NOTE — Patient Instructions (Signed)
 Shaun Harris  12/23/2023     @PREFPERIOPPHARMACY @   Your procedure is scheduled on  12/30/2023.   Report to [facility] at  0900  A.M.   Call this number if you have problems the morning of surgery:  [phone]  If you experience any cold or flu symptoms such as cough, fever, chills, shortness of breath, etc. between now and your scheduled surgery, please notify us  at the above number.   Remember:        Take 1/2 of your night time insulin  the night before your procedure.        DO NOT take any medications for diabetes the morning of your procedure.        Use your inhaler before you come and bring your rescue inhaler with you.   Follow the diet and prep instructions given to you by the office.    You may drink clear liquids until 0700 am on 12/30/2023.   Clear liquids allowed are:                    Water, Juice (No red color; non-citric and without pulp; diabetics please choose diet or no sugar options), Carbonated beverages (diabetics please choose diet or no sugar options), Clear Tea (No creamer, milk, or cream, including half & half and powdered creamer), Black Coffee Only (No creamer, milk or cream, including half & half and powdered creamer), and Clear Sports drink (No red color; diabetics please choose diet or no sugar options)    Take these medicines the morning of surgery with A SIP OF WATER         alprazolam , bupropion , hydroxyzine , levetiracetam , mesalamine , MS contin  or oxycodone (if needed), tansulosin, venlafaxine , zofran  (if needed).    Do not wear jewelry, make-up or nail polish, including gel polish,  artificial nails, or any other type of covering on natural nails (fingers and  toes).  Do not wear lotions, powders, or perfumes, or deodorant.  Do not shave 48 hours prior to surgery.  Men may shave face and neck.  Do not bring valuables to the hospital.  [facility] is not responsible for any belongings or valuables.  Contacts, dentures or  bridgework may not be worn into surgery.  Leave your suitcase in the car.  After surgery it may be brought to your room.  For patients admitted to the hospital, discharge time will be determined by your treatment team.  Patients discharged the day of surgery will not be allowed to drive home and must have someone with them for 24 hours.    Special instructions:   DO NOT smoke tobacco or vape for 24 hours before your procedure.  Please read over the following fact sheets that you were given. Anesthesia Post-op Instructions and Care and Recovery After Surgery      Colonoscopy, Adult, Care After The following information offers guidance on how to care for yourself after your procedure. Your health care provider may also give you more specific instructions. If you have problems or questions, contact your health care provider. What can I expect after the procedure? After the procedure, it is common to have: A small amount of blood in your stool for 24 hours after the procedure. Some gas. Mild cramping or bloating of your abdomen. Follow these instructions at home: Eating and drinking  Drink enough fluid to keep your urine pale yellow. Follow instructions from your health care provider about eating or drinking restrictions.  Resume your normal diet as told by your health care provider. Avoid heavy or fried foods that are hard to digest. Activity Rest as told by your health care provider. Avoid sitting for a long time without moving. Get up to take short walks every 1-2 hours. This is important to improve blood flow and breathing. Ask for help if you feel weak or unsteady. Return to your normal activities as told by your health care provider. Ask your health care provider what activities are safe for you. Managing cramping and bloating  Try walking around when you have cramps or feel bloated. If directed, apply heat to your abdomen as told by your health care provider. Use the heat source  that your health care provider recommends, such as a moist heat pack or a heating pad. Place a towel between your skin and the heat source. Leave the heat on for 20-30 minutes. Remove the heat if your skin turns bright red. This is especially important if you are unable to feel pain, heat, or cold. You have a greater risk of getting burned. General instructions If you were given a sedative during the procedure, it can affect you for several hours. Do not drive or operate machinery until your health care provider says that it is safe. For the first 24 hours after the procedure: Do not sign important documents. Do not drink alcohol. Do your regular daily activities at a slower pace than normal. Eat soft foods that are easy to digest. Take over-the-counter and prescription medicines only as told by your health care provider. Keep all follow-up visits. This is important. Contact a health care provider if: You have blood in your stool 2-3 days after the procedure. Get help right away if: You have more than a small spotting of blood in your stool. You have large blood clots in your stool. You have swelling of your abdomen. You have nausea or vomiting. You have a fever. You have increasing pain in your abdomen that is not relieved with medicine. These symptoms may be an emergency. Get help right away. Call 911. Do not wait to see if the symptoms will go away. Do not drive yourself to the hospital. Summary After the procedure, it is common to have a small amount of blood in your stool. You may also have mild cramping and bloating of your abdomen. If you were given a sedative during the procedure, it can affect you for several hours. Do not drive or operate machinery until your health care provider says that it is safe. Get help right away if you have a lot of blood in your stool, nausea or vomiting, a fever, or increased pain in your abdomen. This information is not intended to replace advice  given to you by your health care provider. Make sure you discuss any questions you have with your health care provider. Document Revised: 08/13/2022 Document Reviewed: 02/21/2021 Elsevier Patient Education  2024 [employer]General Anesthesia, Adult, Care After The following information offers guidance on how to care for yourself after your procedure. Your health care provider may also give you more specific instructions. If you have problems or questions, contact your health care provider. What can I expect after the procedure? After the procedure, it is common for people to: Have pain or discomfort at the IV site. Have nausea or vomiting. Have a sore throat or hoarseness. Have trouble concentrating. Feel cold or chills. Feel weak, sleepy, or tired (fatigue). Have soreness and body aches. These can affect parts  of the body that were not involved in surgery. Follow these instructions at home: For the time period you were told by your health care provider:  Rest. Do not participate in activities where you could fall or become injured. Do not drive or use machinery. Do not drink alcohol. Do not take sleeping pills or medicines that cause drowsiness. Do not make important decisions or sign legal documents. Do not take care of children on your own. General instructions Drink enough fluid to keep your urine pale yellow. If you have sleep apnea, surgery and certain medicines can increase your risk for breathing problems. Follow instructions from your health care provider about wearing your sleep device: Anytime you are sleeping, including during daytime naps. While taking prescription pain medicines, sleeping medicines, or medicines that make you drowsy. Return to your normal activities as told by your health care provider. Ask your health care provider what activities are safe for you. Take over-the-counter and prescription medicines only as told by your health care provider. Do not use  any products that contain nicotine or tobacco. These products include cigarettes, chewing tobacco, and vaping devices, such as e-cigarettes. These can delay incision healing after surgery. If you need help quitting, ask your health care provider. Contact a health care provider if: You have nausea or vomiting that does not get better with medicine. You vomit every time you eat or drink. You have pain that does not get better with medicine. You cannot urinate or have bloody urine. You develop a skin rash. You have a fever. Get help right away if: You have trouble breathing. You have chest pain. You vomit blood. These symptoms may be an emergency. Get help right away. Call 911. Do not wait to see if the symptoms will go away. Do not drive yourself to the hospital. Summary After the procedure, it is common to have a sore throat, hoarseness, nausea, vomiting, or to feel weak, sleepy, or fatigue. For the time period you were told by your health care provider, do not drive or use machinery. Get help right away if you have difficulty breathing, have chest pain, or vomit blood. These symptoms may be an emergency. This information is not intended to replace advice given to you by your health care provider. Make sure you discuss any questions you have with your health care provider. Document Revised: 09/28/2021 Document Reviewed: 09/28/2021 Elsevier Patient Education  2024 [employer].

## 2023-12-25 ENCOUNTER — Encounter (HOSPITAL_COMMUNITY)
Admission: RE | Admit: 2023-12-25 | Discharge: 2023-12-25 | Disposition: A | Discharge status: Home / Self Care | Source: Ambulatory Visit | Attending: Gastroenterology | Admitting: Gastroenterology

## 2023-12-25 ENCOUNTER — Other Ambulatory Visit (INDEPENDENT_AMBULATORY_CARE_PROVIDER_SITE_OTHER): Payer: Self-pay

## 2023-12-25 VITALS — BP 122/66 | HR 100 | Resp 18 | Ht 67.0 in | Wt 268.7 lb

## 2023-12-25 DIAGNOSIS — N183 Chronic kidney disease, stage 3 unspecified: Secondary | ICD-10-CM | POA: Insufficient documentation

## 2023-12-25 DIAGNOSIS — E1122 Type 2 diabetes mellitus with diabetic chronic kidney disease: Secondary | ICD-10-CM | POA: Diagnosis not present

## 2023-12-25 DIAGNOSIS — Z01810 Encounter for preprocedural cardiovascular examination: Secondary | ICD-10-CM | POA: Diagnosis present

## 2023-12-25 DIAGNOSIS — Z720 Tobacco use: Secondary | ICD-10-CM | POA: Diagnosis not present

## 2023-12-25 DIAGNOSIS — I129 Hypertensive chronic kidney disease with stage 1 through stage 4 chronic kidney disease, or unspecified chronic kidney disease: Secondary | ICD-10-CM | POA: Insufficient documentation

## 2023-12-25 DIAGNOSIS — I4510 Unspecified right bundle-branch block: Secondary | ICD-10-CM | POA: Diagnosis not present

## 2023-12-25 DIAGNOSIS — R9431 Abnormal electrocardiogram [ECG] [EKG]: Secondary | ICD-10-CM | POA: Insufficient documentation

## 2023-12-25 DIAGNOSIS — Z794 Long term (current) use of insulin: Secondary | ICD-10-CM | POA: Insufficient documentation

## 2023-12-25 DIAGNOSIS — I10 Essential (primary) hypertension: Secondary | ICD-10-CM

## 2023-12-25 MED ORDER — PEG 3350-KCL-NA BICARB-NACL 420 G PO SOLR: 4000.0000 mL | Freq: Once | ORAL | 0 refills | Status: AC

## 2023-12-30 ENCOUNTER — Encounter (HOSPITAL_COMMUNITY)
Admission: RE | Disposition: A | Discharge status: Home / Self Care | Payer: Self-pay | Source: Home / Self Care | Attending: Gastroenterology

## 2023-12-30 ENCOUNTER — Ambulatory Visit (HOSPITAL_COMMUNITY): Admitting: Anesthesiology

## 2023-12-30 ENCOUNTER — Ambulatory Visit (HOSPITAL_COMMUNITY)
Admission: RE | Admit: 2023-12-30 | Discharge: 2023-12-30 | Disposition: A | Discharge status: Home / Self Care | Attending: Gastroenterology | Admitting: Gastroenterology

## 2023-12-30 DIAGNOSIS — Z8673 Personal history of transient ischemic attack (TIA), and cerebral infarction without residual deficits: Secondary | ICD-10-CM | POA: Diagnosis not present

## 2023-12-30 DIAGNOSIS — K648 Other hemorrhoids: Secondary | ICD-10-CM | POA: Diagnosis not present

## 2023-12-30 DIAGNOSIS — Z7951 Long term (current) use of inhaled steroids: Secondary | ICD-10-CM | POA: Insufficient documentation

## 2023-12-30 DIAGNOSIS — Z794 Long term (current) use of insulin: Secondary | ICD-10-CM | POA: Diagnosis not present

## 2023-12-30 DIAGNOSIS — E785 Hyperlipidemia, unspecified: Secondary | ICD-10-CM | POA: Diagnosis not present

## 2023-12-30 DIAGNOSIS — F17210 Nicotine dependence, cigarettes, uncomplicated: Secondary | ICD-10-CM

## 2023-12-30 DIAGNOSIS — E119 Type 2 diabetes mellitus without complications: Secondary | ICD-10-CM | POA: Diagnosis not present

## 2023-12-30 DIAGNOSIS — Z79899 Other long term (current) drug therapy: Secondary | ICD-10-CM | POA: Insufficient documentation

## 2023-12-30 DIAGNOSIS — E1122 Type 2 diabetes mellitus with diabetic chronic kidney disease: Secondary | ICD-10-CM | POA: Diagnosis not present

## 2023-12-30 DIAGNOSIS — K623 Rectal prolapse: Secondary | ICD-10-CM | POA: Diagnosis not present

## 2023-12-30 DIAGNOSIS — D122 Benign neoplasm of ascending colon: Secondary | ICD-10-CM | POA: Diagnosis not present

## 2023-12-30 DIAGNOSIS — N183 Chronic kidney disease, stage 3 unspecified: Secondary | ICD-10-CM

## 2023-12-30 DIAGNOSIS — R519 Headache, unspecified: Secondary | ICD-10-CM | POA: Diagnosis not present

## 2023-12-30 DIAGNOSIS — Z7982 Long term (current) use of aspirin: Secondary | ICD-10-CM | POA: Diagnosis not present

## 2023-12-30 DIAGNOSIS — Z87442 Personal history of urinary calculi: Secondary | ICD-10-CM | POA: Diagnosis not present

## 2023-12-30 DIAGNOSIS — I10 Essential (primary) hypertension: Secondary | ICD-10-CM | POA: Diagnosis not present

## 2023-12-30 DIAGNOSIS — J449 Chronic obstructive pulmonary disease, unspecified: Secondary | ICD-10-CM

## 2023-12-30 DIAGNOSIS — D123 Benign neoplasm of transverse colon: Secondary | ICD-10-CM | POA: Diagnosis not present

## 2023-12-30 DIAGNOSIS — E781 Pure hyperglyceridemia: Secondary | ICD-10-CM | POA: Diagnosis not present

## 2023-12-30 DIAGNOSIS — K279 Peptic ulcer, site unspecified, unspecified as acute or chronic, without hemorrhage or perforation: Secondary | ICD-10-CM | POA: Diagnosis not present

## 2023-12-30 DIAGNOSIS — K6389 Other specified diseases of intestine: Secondary | ICD-10-CM

## 2023-12-30 DIAGNOSIS — D120 Benign neoplasm of cecum: Secondary | ICD-10-CM | POA: Insufficient documentation

## 2023-12-30 DIAGNOSIS — F419 Anxiety disorder, unspecified: Secondary | ICD-10-CM | POA: Insufficient documentation

## 2023-12-30 DIAGNOSIS — G4733 Obstructive sleep apnea (adult) (pediatric): Secondary | ICD-10-CM | POA: Insufficient documentation

## 2023-12-30 DIAGNOSIS — Z09 Encounter for follow-up examination after completed treatment for conditions other than malignant neoplasm: Secondary | ICD-10-CM | POA: Insufficient documentation

## 2023-12-30 DIAGNOSIS — K5190 Ulcerative colitis, unspecified, without complications: Secondary | ICD-10-CM

## 2023-12-30 DIAGNOSIS — K635 Polyp of colon: Secondary | ICD-10-CM | POA: Insufficient documentation

## 2023-12-30 HISTORY — PX: COLONOSCOPY: SHX5424

## 2023-12-30 LAB — GLUCOSE, CAPILLARY: Glucose-Capillary: 212 mg/dL — ABNORMAL HIGH (ref 70–99)

## 2023-12-30 SURGERY — COLONOSCOPY
Anesthesia: General

## 2023-12-30 MED ORDER — LIDOCAINE 2% (20 MG/ML) 5 ML SYRINGE
INTRAMUSCULAR | Status: DC | PRN
Start: 2023-12-30 — End: 2023-12-30
  Administered 2023-12-30: 100 mg via INTRAVENOUS

## 2023-12-30 MED ORDER — PROPOFOL 10 MG/ML IV BOLUS
INTRAVENOUS | Status: DC | PRN
  Administered 2023-12-30: 100 mg via INTRAVENOUS
  Administered 2023-12-30: 50 mg via INTRAVENOUS

## 2023-12-30 MED ORDER — PHENYLEPHRINE 80 MCG/ML (10ML) SYRINGE FOR IV PUSH (FOR BLOOD PRESSURE SUPPORT)
PREFILLED_SYRINGE | INTRAVENOUS | Status: DC | PRN
  Administered 2023-12-30: 160 ug via INTRAVENOUS
  Administered 2023-12-30: 80 ug via INTRAVENOUS
  Administered 2023-12-30: 160 ug via INTRAVENOUS

## 2023-12-30 MED ORDER — PHENYLEPHRINE 80 MCG/ML (10ML) SYRINGE FOR IV PUSH (FOR BLOOD PRESSURE SUPPORT)
PREFILLED_SYRINGE | INTRAVENOUS | Status: AC
Start: 2023-12-30 — End: 2023-12-30
  Filled 2023-12-30: qty 10

## 2023-12-30 MED ORDER — PROPOFOL 500 MG/50ML IV EMUL
INTRAVENOUS | Status: DC | PRN
  Administered 2023-12-30: 150 ug/kg/min via INTRAVENOUS

## 2023-12-30 MED ORDER — LACTATED RINGERS IV SOLN: INTRAVENOUS | Status: DC

## 2023-12-30 NOTE — Anesthesia Preprocedure Evaluation (Addendum)
 Anesthesia Evaluation  Patient identified by MRN, date of birth, ID band Patient awake    Reviewed: Allergy & Precautions, H&P , NPO status , Patient's Chart, lab work & pertinent test results, reviewed documented beta blocker date and time   Airway Mallampati: II  TM Distance: >3 FB Neck ROM: full    Dental  (+) Missing,    Pulmonary sleep apnea , COPD, Current Smoker   Pulmonary exam normal breath sounds clear to auscultation       Cardiovascular Exercise Tolerance: Good hypertension,  Rhythm:regular Rate:Normal     Neuro/Psych  Headaches  Anxiety     CVA  negative psych ROS   GI/Hepatic Neg liver ROS, PUD,,,  Endo/Other  diabetes    Renal/GU Renal disease  negative genitourinary   Musculoskeletal   Abdominal   Peds  Hematology negative hematology ROS (+)   Anesthesia Other Findings   Reproductive/Obstetrics negative OB ROS                             Anesthesia Physical Anesthesia Plan  ASA: 3  Anesthesia Plan: General   Post-op Pain Management:    Induction:   PONV Risk Score and Plan: Propofol  infusion  Airway Management Planned:   Additional Equipment:   Intra-op Plan:   Post-operative Plan:   Informed Consent: I have reviewed the patients History and Physical, chart, labs and discussed the procedure including the risks, benefits and alternatives for the proposed anesthesia with the patient or authorized representative who has indicated his/her understanding and acceptance.     Dental Advisory Given  Plan Discussed with: CRNA  Anesthesia Plan Comments:        Anesthesia Quick Evaluation

## 2023-12-30 NOTE — H&P (Signed)
 Shaun Harris is an 58 y.o. male.   Chief Complaint: ulcerative colitis HPI: Shaun Harris is a 58 y.o. male with past medical history of  UC, stroke, anxiety, COPD, urolithiasis, DM, HLD, HTN, OSA , coming for follow up of UC.   The patient denies having any nausea, vomiting, fever, chills, hematochezia, melena, hematemesis, abdominal distention, abdominal pain, diarrhea, jaundice, pruritus or weight loss.  Past Medical History:  Diagnosis Date   Anxiety    Arthritis    Cerebral vasculitis    Chronic headaches    constant since possible stroke in 1997,Vasculitis of brain with increased pressure of brain .   COPD (chronic obstructive pulmonary disease) (HCC)    COPD (chronic obstructive pulmonary disease) (HCC)    Diabetes mellitus    Hyperlipidemia    Hypertension    Hypertriglyceridemia    Sleep apnea    has but doesnt use, cannot tolerate; PCP aware   Stroke [facility])    pt states,they arent sure if i had a stroke in 1997, there is still a question about that with my doctors. No deficits.   Tobacco abuse    Ulcerative colitis (HCC)    Ulcerative colitis (HCC)    Venous stasis     Past Surgical History:  Procedure Laterality Date   BIOPSY  10/25/2016   Procedure: BIOPSY;  Surgeon: Ruby Corporal, MD;  Location: [facility];  Service: Endoscopy;;  colon   CERVICAL SPINE SURGERY  2008   x2-last surgery 04/24/2016.   COLONOSCOPY WITH PROPOFOL  N/A 10/25/2016   Procedure: COLONOSCOPY WITH PROPOFOL ;  Surgeon: Ruby Corporal, MD;  Location: [facility];  Service: Endoscopy;  Laterality: N/A;  7:30   EXTRACORPOREAL SHOCK WAVE LITHOTRIPSY Left 08/08/2020   Procedure: EXTRACORPOREAL SHOCK WAVE LITHOTRIPSY (ESWL);  Surgeon: Marco Severs, MD;  Location: [facility];  Service: Urology;  Laterality: Left;   KNEE ARTHROSCOPY  02/25/2012   Procedure: ARTHROSCOPY KNEE;  Surgeon: Pleasant Brilliant, MD;  Location: [facility];  Service: Orthopedics;  Laterality: Right;   KNEE ARTHROSCOPY WITH  MEDIAL MENISECTOMY Left 12/23/2014   Procedure: LEFT KNEE ARTHROSCOPY WITH PARTIAL MENISECTOMY;  Surgeon: Pleasant Brilliant, MD;  Location: [facility];  Service: Orthopedics;  Laterality: Left;   POLYPECTOMY  10/25/2016   Procedure: POLYPECTOMY;  Surgeon: Ruby Corporal, MD;  Location: [facility];  Service: Endoscopy;;  colon   SHOULDER ARTHROSCOPY  2004   left shoulder   SHOULDER ARTHROSCOPY  2008   right shoulder    Family History  Problem Relation Age of Onset   Hypertension Father    Diabetes Father    Heart disease Father    Parkinson's disease Father    Hypertension Sister    Heart disease Brother        both brothers have heart disease   Heart disease Maternal Grandfather    Lupus Daughter    Neurofibromatosis Son    Parkinson's disease Paternal Aunt    Parkinson's disease Paternal Uncle    Heart attack Other    Social History:  reports that he has been smoking cigarettes. He has a 35 pack-year smoking history. He has been exposed to tobacco smoke. He has never used smokeless tobacco. He reports that he does not drink alcohol and does not use drugs.  Allergies:  Allergies  Allergen Reactions   Gabapentin Other (See Comments)    Increased blood sugar    Medications Prior to Admission  Medication Sig Dispense Refill   ALPRAZolam  (XANAX )  0.5 MG tablet Take 0.5 mg by mouth 3 (three) times daily.     atorvastatin  (LIPITOR) 20 MG tablet Take 1 tablet (20 mg total) by mouth daily. 90 tablet 1   buPROPion  (WELLBUTRIN  SR) 150 MG 12 hr tablet Take 1 tablet (150 mg total) by mouth 2 (two) times daily. 180 tablet 1   fluticasone -salmeterol (ADVAIR) 250-50 MCG/ACT AEPB inhale ONE PUFF by MOUTH every 12 hours. <<rinse MOUTH AFTER use>> 180 each 1   furosemide  (LASIX ) 20 MG tablet TAKE ONE TABLET BY MOUTH ONCE DAILY 30 tablet 3   hydrOXYzine  (ATARAX /VISTARIL ) 50 MG tablet Take 50 mg by mouth daily as needed for anxiety.     levETIRAcetam  (KEPPRA ) 500 MG tablet TAKE THREE TABLETS BY MOUTH  TWICE DAILY 540 tablet 1   Mesalamine  (ASACOL ) 400 MG CPDR DR capsule TAKE ONE CAPSULE BY MOUTH FOUR TIMES DAILY 360 capsule 1   morphine  (MS CONTIN ) 30 MG 12 hr tablet Take 1 tablet (30 mg total) by mouth 2 (two) times daily. May fill 60 days after 09/30/2017 60 tablet 0   Multiple Vitamins-Minerals (CENTRUM PO) Take 1 tablet by mouth daily.     ondansetron  (ZOFRAN -ODT) 8 MG disintegrating tablet DISSOLVE ONE TABLET BY MOUTH EVERY 8 HOURS AS NEEDED FOR NAUSEA AND VOMITING 20 tablet 6   Oxycodone  HCl 10 MG TABS Take 1 tablet by mouth 4 times a day as needed for pain. Do not drive or operate machinery while on this medicine. (Patient taking differently: Take 10 mg by mouth 4 (four) times daily as needed (pain). Do not drive or operate machinery while on this medicine.) 120 tablet 0   polyethylene glycol (MIRALAX ) 17 g packet Take 17 g by mouth daily. 14 each 0   QUEtiapine (SEROQUEL) 100 MG tablet Take 100 mg by mouth at bedtime.     tamsulosin  (FLOMAX ) 0.4 MG CAPS capsule Take 1 capsule (0.4 mg total) by mouth 2 (two) times daily. 180 capsule 3   traZODone (DESYREL) 100 MG tablet Take 100 mg by mouth at bedtime.     venlafaxine  XR (EFFEXOR -XR) 37.5 MG 24 hr capsule Take 1 capsule (37.5 mg total) by mouth daily. 90 capsule 3   vitamin C (ASCORBIC ACID) 500 MG tablet Take 500 mg by mouth daily.     acetaminophen  (TYLENOL ) 500 MG tablet Take 1,000 mg by mouth every 6 (six) hours as needed for moderate pain.     AIMOVIG 70 MG/ML SOAJ Inject 70 mg into the skin every 30 (thirty) days.     albuterol  (VENTOLIN  HFA) 108 (90 Base) MCG/ACT inhaler inhale TWO puffs by MOUTH every SIX hours as needed for shortness of breath 18 g 5   aspirin EC 325 MG tablet Take 325 mg by mouth at bedtime.     Blood Glucose Monitoring Suppl w/Device KIT Test glucose 4 times daily. E11.9 100 each 5   glucose blood (TRUE METRIX BLOOD GLUCOSE TEST) test strip check blood sugar FOUR TIMES DAILY 100 each 0   insulin  aspart (FIASP )  100 UNIT/ML FlexTouch Pen Inject 10-16 Units into the skin with breakfast, with lunch, and with evening meal. 36 mL 3   Insulin  Glargine (BASAGLAR  KWIKPEN) 100 UNIT/ML Inject 60 Units into the skin at bedtime. 60 mL 0   Insulin  Pen Needle (PEN NEEDLES) 31G X 8 MM MISC Use to inject insulin  4 times daily 300 each 3   Insulin  Syringe-Needle U-100 (BD VEO INSULIN  SYRINGE U/F) 31G X 15/64 1 ML MISC AS DIRECTED  100 each 1   sodium bicarbonate  650 MG tablet TAKE 1 TABLET BY MOUTH TWICE DAILY 60 tablet 11   TRUEPLUS INSULIN  SYRINGE 31G X 5/16 1 ML MISC AS DIRECTED 100 each 0    Results for orders placed or performed during the hospital encounter of 12/30/23 (from the past 48 hours)  Glucose, capillary     Status: Abnormal   Collection Time: 12/30/23  7:19 AM  Result Value Ref Range   Glucose-Capillary 212 (H) 70 - 99 mg/dL    Comment: Glucose reference range applies only to samples taken after fasting for at least 8 hours.   No results found.  Review of Systems  All other systems reviewed and are negative.   Blood pressure (!) 155/67, pulse 98, temperature 98.1 F (36.7 C), temperature source Oral, resp. rate 18, SpO2 95%. Physical Exam  GENERAL: The patient is AO x3, in no acute distress. HEENT: Head is normocephalic and atraumatic. EOMI are intact. Mouth is well hydrated and without lesions. NECK: Supple. No masses LUNGS: Clear to auscultation. No presence of rhonchi/wheezing/rales. Adequate chest expansion HEART: RRR, normal s1 and s2. ABDOMEN: Soft, nontender, no guarding, no peritoneal signs, and nondistended. BS +. No masses. EXTREMITIES: Without any cyanosis, clubbing, rash, lesions or edema. NEUROLOGIC: AOx3, no focal motor deficit. SKIN: no jaundice, no rashes  Assessment/Plan Shaun Harris is a 58 y.o. male with past medical history of  UC, stroke, anxiety, COPD, urolithiasis, DM, HLD, HTN, OSA , coming for follow up of UC. We will proceed with colonoscopy.  Urban Garden, MD 12/30/2023, 7:38 AM

## 2023-12-30 NOTE — Discharge Instructions (Signed)
You are being discharged to home.  Resume your previous diet.  We are waiting for your pathology results.  Continue your present medications.  Your physician has recommended a repeat colonoscopy in three years for surveillance.

## 2023-12-30 NOTE — Anesthesia Procedure Notes (Signed)
 Date/Time: 12/30/2023 8:14 AM  Performed by: Sherwin Donate, CRNAPre-anesthesia Checklist: Patient identified, Emergency Drugs available, Suction available and Patient being monitored Patient Re-evaluated:Patient Re-evaluated prior to induction Oxygen Delivery Method: Nasal cannula Induction Type: IV induction Placement Confirmation: positive ETCO2 Comments: Optiflow High Flow  O2 used.

## 2023-12-30 NOTE — Op Note (Signed)
 [facility] Patient Name: Shaun Harris Procedure Date: 12/30/2023 8:00 AM MRN: [id number] Date of Birth: 09/21/1965 Attending MD: Samantha Cress , , [id number] CSN: [id number] Age: 58 Admit Type: Outpatient Procedure:                Colonoscopy Indications:              Follow-up of ulcerative colitis Providers:                Samantha Cress, Pasco Bond, RN, Annell Barrow Referring MD:              Medicines:                Monitored Anesthesia Care Complications:            No immediate complications. Estimated Blood Loss:     Estimated blood loss: none. Procedure:                Pre-Anesthesia Assessment:                           - Prior to the procedure, a History and Physical                            was performed, and patient medications, allergies                            and sensitivities were reviewed. The patient's                            tolerance of previous anesthesia was reviewed.                           - The risks and benefits of the procedure and the                            sedation options and risks were discussed with the                            patient. All questions were answered and informed                            consent was obtained.                           - ASA Grade Assessment: III - A patient with severe                            systemic disease.                           After obtaining informed consent, the colonoscope                            was passed under direct vision. Throughout the                            procedure, the patient's blood pressure,  pulse, and                            oxygen saturations were monitored continuously. The                            [id number]-[id number] ([id number]) scope was introduced through                            the anus and advanced to the the terminal ileum.                            The colonoscopy was performed without difficulty.                            The patient tolerated  the procedure well. The                            quality of the bowel preparation was adequate. Scope In: 8:20:10 AM Scope Out: 8:49:02 AM Scope Withdrawal Time: 0 hours 19 minutes 8 seconds  Total Procedure Duration: 0 hours 28 minutes 52 seconds  Findings:      The perianal and digital rectal examinations were normal.      The terminal ileum appeared normal.      Five sessile polyps were found in the transverse colon, ascending colon       and cecum. The polyps were 4 to 6 mm in size. These polyps were removed       with a cold snare. Resection and retrieval were complete.      Three sessile polyps were found in the sigmoid colon and descending       colon. The polyps were 4 to 5 mm in size. These polyps were removed with       a cold snare. Resection and retrieval were complete.      Inflammation was not found based on the endoscopic appearance of the       mucosa in the colon. This was graded as Mayo Score 0 (normal or inactive       disease), and when compared to the previous examination, the findings       are in remission. Biopsies were taken from the rectum with a cold       forceps for histology.      Non-bleeding internal hemorrhoids were found during retroflexion. The       hemorrhoids were small. Impression:               - The examined portion of the ileum was normal.                           - Five 4 to 6 mm polyps in the transverse colon, in                            the ascending colon and in the cecum, removed with                            a cold snare. Resected and retrieved.                           -  Three 4 to 5 mm polyps in the sigmoid colon and                            in the descending colon, removed with a cold snare.                            Resected and retrieved.                           - Inactive (Mayo Score 0) ulcerative colitis, in                            remission since the last examination. Biopsied.                           -  Non-bleeding internal hemorrhoids. Moderate Sedation:      Per Anesthesia Care Recommendation:           - Discharge patient to home (ambulatory).                           - Resume previous diet.                           - Await pathology results.                           - Continue present medications.                           - Repeat colonoscopy in 3 years for surveillance. Procedure Code(s):        --- Professional ---                           [phone], Colonoscopy, flexible; with removal of                            tumor(s), polyp(s), or other lesion(s) by snare                            technique                           45380, 59, Colonoscopy, flexible; with biopsy,                            single or multiple Diagnosis Code(s):        --- Professional ---                           K64.8, Other hemorrhoids                           D12.3, Benign neoplasm of transverse colon (hepatic                            flexure or splenic flexure)  D12.2, Benign neoplasm of ascending colon                           D12.0, Benign neoplasm of cecum                           D12.5, Benign neoplasm of sigmoid colon                           D12.4, Benign neoplasm of descending colon                           K51.90, Ulcerative colitis, unspecified, without                            complications CPT copyright 2022 American Medical Association. All rights reserved. The codes documented in this report are preliminary and upon coder review may  be revised to meet current compliance requirements. Samantha Cress, MD Samantha Cress,  12/30/2023 8:55:15 AM This report has been signed electronically. Number of Addenda: 0

## 2023-12-30 NOTE — Transfer of Care (Signed)
 Immediate Anesthesia Transfer of Care Note  Patient: Shaun Harris  Procedure(s) Performed: COLONOSCOPY  Patient Location: Short Stay  Anesthesia Type:General  Level of Consciousness: awake  Airway & Oxygen Therapy: Patient Spontanous Breathing  Post-op Assessment: Report given to RN and Post -op Vital signs reviewed and stable  Post vital signs: Reviewed and stable  Last Vitals:  Vitals Value Taken Time  BP 114/54 12/30/23 08:52  Temp 36.6 C 12/30/23 08:52  Pulse 83 12/30/23 08:52  Resp 18 12/30/23 08:52  SpO2 96 % 12/30/23 08:52    Last Pain:  Vitals:   12/30/23 0852  TempSrc: Oral  PainSc:       Patients Stated Pain Goal: 6 (12/30/23 0725)  Complications: No notable events documented.

## 2023-12-31 ENCOUNTER — Ambulatory Visit (INDEPENDENT_AMBULATORY_CARE_PROVIDER_SITE_OTHER): Payer: Self-pay | Admitting: Gastroenterology

## 2023-12-31 ENCOUNTER — Encounter (HOSPITAL_COMMUNITY): Payer: Self-pay | Admitting: Gastroenterology

## 2023-12-31 LAB — SURGICAL PATHOLOGY

## 2024-01-02 ENCOUNTER — Ambulatory Visit (INDEPENDENT_AMBULATORY_CARE_PROVIDER_SITE_OTHER): Payer: Medicare Other | Admitting: Family Medicine

## 2024-01-02 VITALS — BP 141/89 | HR 101 | Temp 97.9°F | Ht 67.0 in | Wt 274.4 lb

## 2024-01-02 DIAGNOSIS — E785 Hyperlipidemia, unspecified: Secondary | ICD-10-CM

## 2024-01-02 DIAGNOSIS — Z794 Long term (current) use of insulin: Secondary | ICD-10-CM

## 2024-01-02 DIAGNOSIS — I10 Essential (primary) hypertension: Secondary | ICD-10-CM

## 2024-01-02 DIAGNOSIS — M25521 Pain in right elbow: Secondary | ICD-10-CM

## 2024-01-02 DIAGNOSIS — E1122 Type 2 diabetes mellitus with diabetic chronic kidney disease: Secondary | ICD-10-CM | POA: Diagnosis not present

## 2024-01-02 DIAGNOSIS — N183 Chronic kidney disease, stage 3 unspecified: Secondary | ICD-10-CM

## 2024-01-02 MED ORDER — MUPIROCIN 2 % EX OINT: 1.0000 | TOPICAL_OINTMENT | Freq: Three times a day (TID) | CUTANEOUS | 0 refills | Status: AC

## 2024-01-02 MED ORDER — DOXYCYCLINE HYCLATE 100 MG PO TABS: 100.0000 mg | ORAL_TABLET | Freq: Two times a day (BID) | ORAL | 0 refills | Status: DC

## 2024-01-02 MED ORDER — SEMAGLUTIDE(0.25 OR 0.5MG/DOS) 2 MG/3ML ~~LOC~~ SOPN: 0.5000 mg | PEN_INJECTOR | SUBCUTANEOUS | 1 refills | Status: DC

## 2024-01-02 NOTE — Patient Instructions (Addendum)
 Restarting Ozempic .  Medications sent in.  Follow up in 3 months.

## 2024-01-03 ENCOUNTER — Other Ambulatory Visit: Payer: Self-pay | Admitting: Family Medicine

## 2024-01-03 NOTE — Anesthesia Postprocedure Evaluation (Signed)
 Anesthesia Post Note  Patient: Shaun Harris  Procedure(s) Performed: COLONOSCOPY  Patient location during evaluation: Phase II Anesthesia Type: General Level of consciousness: awake Pain management: pain level controlled Vital Signs Assessment: post-procedure vital signs reviewed and stable Respiratory status: spontaneous breathing and respiratory function stable Cardiovascular status: blood pressure returned to baseline and stable Postop Assessment: no headache and no apparent nausea or vomiting Anesthetic complications: no Comments: Late entry   No notable events documented.   Last Vitals:  Vitals:   12/30/23 0852 12/30/23 0857  BP: (!) 114/54 107/72  Pulse: 83   Resp: 18   Temp: 36.6 C   SpO2: 96%     Last Pain:  Vitals:   12/31/23 1451  TempSrc:   PainSc: 0-No pain                 Shaun Harris

## 2024-01-05 ENCOUNTER — Encounter: Payer: Self-pay | Admitting: Family Medicine

## 2024-01-05 DIAGNOSIS — M25521 Pain in right elbow: Secondary | ICD-10-CM | POA: Insufficient documentation

## 2024-01-05 NOTE — Assessment & Plan Note (Signed)
At goal. Continue Lipitor. 

## 2024-01-05 NOTE — Progress Notes (Signed)
 Subjective:  Patient ID: Shaun Harris, male    DOB: 22-Jul-1965  Age: 59 y.o. MRN: [id number]  CC: Follow-up   HPI:  58 year old male with an extensive past medical history presents for follow-up.  Patient reports that his blood sugars are remaining elevated.  He states that he was taken off Ozempic  due to constipation.  He states that constipation has improved.  He is unsure if Ozempic  was a contributing factor.  He would like to discuss restarting Ozempic .  Last A1c 8.3.  BP mildly elevated here today.  Patient also reports an area of concern to his right elbow.  It has been swollen, red, and painful.  It has improved but is still problematic.  Denies any fever.  Still smoking.  Not interested in quitting at this time.  LDL at goal.  Patient Active Problem List   Diagnosis Date Noted   Right elbow pain 01/05/2024   Long-term current use of mesalamine  05/12/2023   Migraine 04/04/2022   BPH (benign prostatic hyperplasia) 01/01/2022   Ulcerative colitis (HCC) 07/02/2021   Type 2 diabetes mellitus with diabetic chronic kidney disease (HCC) 07/02/2021   History of seizure 03/14/2021   Stage 3 chronic kidney disease (HCC) 02/06/2021   Nephrolithiasis 07/25/2020   COPD (chronic obstructive pulmonary disease) (HCC) 03/10/2013   Tobacco abuse 03/10/2013   Essential hypertension 10/01/2012   Hyperlipidemia 10/01/2012   Chronic pain syndrome 10/01/2012    Social Hx   Social History   Socioeconomic History   Marital status: Widowed    Spouse name: Not on file   Number of children: Not on file   Years of education: Not on file   Highest education level: Not on file  Occupational History   Not on file  Tobacco Use   Smoking status: Every Day    Current packs/day: 1.00    Average packs/day: 1 pack/day for 35.0 years (35.0 ttl pk-yrs)    Types: Cigarettes    Passive exposure: Current   Smokeless tobacco: Never  Vaping Use   Vaping status: Never Used  Substance and Sexual  Activity   Alcohol use: No   Drug use: No   Sexual activity: Yes    Birth control/protection: None  Other Topics Concern   Not on file  Social History Narrative   Disabled   Right handed   Social Drivers of Health   Financial Resource Strain: Low Risk  (03/14/2023)   Overall Financial Resource Strain (CARDIA)    Difficulty of Paying Living Expenses: Not hard at all  Food Insecurity: No Food Insecurity (03/14/2023)   Hunger Vital Sign    Worried About Running Out of Food in the Last Year: Never true    Ran Out of Food in the Last Year: Never true  Transportation Needs: No Transportation Needs (03/14/2023)   PRAPARE - Administrator, Civil Service (Medical): No    Lack of Transportation (Non-Medical): No  Physical Activity: Insufficiently Active (03/14/2023)   Exercise Vital Sign    Days of Exercise per Week: 7 days    Minutes of Exercise per Session: 20 min  Stress: No Stress Concern Present (03/14/2023)   [employer] of Occupational Health - Occupational Stress Questionnaire    Feeling of Stress : Not at all  Social Connections: Moderately Isolated (03/14/2023)   Social Connection and Isolation Panel    Frequency of Communication with Friends and Family: More than three times a week    Frequency of Social Gatherings with  Friends and Family: More than three times a week    Attends Religious Services: 1 to 4 times per year    Active Member of Clubs or Organizations: No    Attends Banker Meetings: Never    Marital Status: Widowed    Review of Systems Per HPI  Objective:  BP (!) 141/89   Pulse (!) 101   Temp 97.9 F (36.6 C)   Ht 5' 7 (1.702 m)   Wt 274 lb 6.4 oz (124.5 kg)   SpO2 94%   BMI 42.98 kg/m      01/02/2024   10:57 AM 12/30/2023    8:57 AM 12/30/2023    8:52 AM  BP/Weight  Systolic BP 141 107 114  Diastolic BP 89 72 54  Wt. (Lbs) 274.4    BMI 42.98 kg/m2      Physical Exam Vitals and nursing note reviewed.   Constitutional:      Appearance: Normal appearance. He is obese.  HENT:     Head: Normocephalic and atraumatic.   Cardiovascular:     Rate and Rhythm: Normal rate and regular rhythm.  Pulmonary:     Effort: Pulmonary effort is normal.     Breath sounds: Normal breath sounds. No wheezing or rales.   Skin:    Comments: Right elbow with an area with eschar.  Mild erythema.   Neurological:     Mental Status: He is alert.   Psychiatric:        Mood and Affect: Mood normal.        Behavior: Behavior normal.     Lab Results  Component Value Date   WBC 8.6 05/12/2023   HGB 14.4 05/12/2023   HCT 45.0 05/12/2023   PLT 277 05/12/2023   GLUCOSE 158 (H) 11/10/2023   CHOL 155 10/02/2022   TRIG 340 (H) 10/02/2022   HDL 35 (L) 10/02/2022   LDLCALC 52 10/02/2022   ALT 13 11/10/2023   AST 15 11/10/2023   NA 143 11/10/2023   K 4.6 11/10/2023   CL 106 11/10/2023   CREATININE 1.48 (H) 11/10/2023   BUN 24 11/10/2023   CO2 26 11/10/2023   TSH 1.57 11/19/2022   HGBA1C 8.3 (A) 11/13/2023     Assessment & Plan:  Type 2 diabetes mellitus with stage 3 chronic kidney disease, with long-term current use of insulin , unspecified whether stage 3a or 3b CKD (HCC) Assessment & Plan: Uncontrolled.  Restart endocrinology.  Restarting Ozempic .  Orders: -     Semaglutide (0.25 or 0.5MG /DOS); Inject 0.5 mg into the skin once a week.  Dispense: 3 mL; Refill: 1  Essential hypertension Assessment & Plan: Fair control.  Continue current medications.  Will continue to monitor.   Hyperlipidemia, unspecified hyperlipidemia type Assessment & Plan: At goal.  Continue Lipitor.   Right elbow pain Assessment & Plan: Area concerning for infection.  Bactroban  and doxycycline  as prescribed.   Other orders -     Doxycycline  Hyclate; Take 1 tablet (100 mg total) by mouth 2 (two) times daily.  Dispense: 14 tablet; Refill: 0 -     Mupirocin ; Apply 1 Application topically 3 (three) times daily for 7  days.  Dispense: 30 g; Refill: 0    Follow-up: 3 months  Handsome Anglin Bluford DO [facility]

## 2024-01-05 NOTE — Assessment & Plan Note (Signed)
 Area concerning for infection.  Bactroban  and doxycycline  as prescribed.

## 2024-01-05 NOTE — Assessment & Plan Note (Signed)
 Uncontrolled.  Restart endocrinology.  Restarting Ozempic .

## 2024-01-05 NOTE — Assessment & Plan Note (Signed)
 Fair control.  Continue current medications.  Will continue to monitor.

## 2024-01-08 NOTE — Progress Notes (Signed)
 3 yr TCS noted in recall Patient result letter mailed Patient's PCP is on EPIC

## 2024-02-06 ENCOUNTER — Other Ambulatory Visit: Payer: Self-pay | Admitting: Urology

## 2024-02-20 ENCOUNTER — Other Ambulatory Visit: Payer: Self-pay | Admitting: Family Medicine

## 2024-02-20 DIAGNOSIS — N183 Chronic kidney disease, stage 3 unspecified: Secondary | ICD-10-CM

## 2024-03-08 ENCOUNTER — Other Ambulatory Visit: Payer: Self-pay | Admitting: Nurse Practitioner

## 2024-03-08 ENCOUNTER — Other Ambulatory Visit: Payer: Self-pay | Admitting: Family Medicine

## 2024-03-08 DIAGNOSIS — Z794 Long term (current) use of insulin: Secondary | ICD-10-CM

## 2024-03-16 ENCOUNTER — Encounter: Payer: Self-pay | Admitting: Nurse Practitioner

## 2024-03-16 ENCOUNTER — Ambulatory Visit (INDEPENDENT_AMBULATORY_CARE_PROVIDER_SITE_OTHER): Admitting: Nurse Practitioner

## 2024-03-16 VITALS — BP 132/78 | HR 99 | Ht 67.0 in | Wt 264.6 lb

## 2024-03-16 DIAGNOSIS — N183 Chronic kidney disease, stage 3 unspecified: Secondary | ICD-10-CM

## 2024-03-16 DIAGNOSIS — Z794 Long term (current) use of insulin: Secondary | ICD-10-CM

## 2024-03-16 DIAGNOSIS — E1122 Type 2 diabetes mellitus with diabetic chronic kidney disease: Secondary | ICD-10-CM | POA: Diagnosis not present

## 2024-03-16 DIAGNOSIS — F17200 Nicotine dependence, unspecified, uncomplicated: Secondary | ICD-10-CM | POA: Diagnosis not present

## 2024-03-16 LAB — POCT GLYCOSYLATED HEMOGLOBIN (HGB A1C): Hemoglobin A1C: 7.8 % — AB (ref 4.0–5.6)

## 2024-03-16 NOTE — Progress Notes (Signed)
 Endocrinology Follow Up Note       03/16/2024, 8:16 AM   Subjective:    Patient ID: Shaun Harris, male    DOB: December 25, 1965.  Shaun Harris is being seen in follow up after being seen in consultation for management of currently uncontrolled symptomatic diabetes requested by  Rensch, Jayce G, DO.   Past Medical History:  Diagnosis Date   Anxiety    Arthritis    Cerebral vasculitis    Chronic headaches    constant since possible stroke in 1997,Vasculitis of brain with increased pressure of brain .   COPD (chronic obstructive pulmonary disease) (HCC)    COPD (chronic obstructive pulmonary disease) (HCC)    Diabetes mellitus    Hyperlipidemia    Hypertension    Hypertriglyceridemia    Sleep apnea    has but doesnt use, cannot tolerate; PCP aware   Stroke [facility])    pt states,they arent sure if i had a stroke in 1997, there is still a question about that with my doctors. No deficits.   Tobacco abuse    Tremor 10/02/2022   Ulcerative colitis (HCC)    Ulcerative colitis (HCC)    Venous stasis     Past Surgical History:  Procedure Laterality Date   BIOPSY  10/25/2016   Procedure: BIOPSY;  Surgeon: Claudis RAYMOND Rivet, MD;  [facility];  Service: Endoscopy;;  colon   CERVICAL SPINE SURGERY  2008   x2-last surgery 04/24/2016.   COLONOSCOPY N/A 12/30/2023   Procedure: COLONOSCOPY;  Surgeon: Eartha Angelia Sieving, MD;  Location: [facility];  Service: Gastroenterology;  Laterality: N/A;  8:15AM;ASA 3   COLONOSCOPY WITH PROPOFOL  N/A 10/25/2016   Procedure: COLONOSCOPY WITH PROPOFOL ;  Surgeon: Claudis RAYMOND Rivet, MD;  [facility]: [facility];  Service: Endoscopy;  Laterality: N/A;  7:30   EXTRACORPOREAL SHOCK WAVE LITHOTRIPSY Left 08/08/2020   Procedure: EXTRACORPOREAL SHOCK WAVE LITHOTRIPSY (ESWL);  Surgeon: Sherrilee Belvie CROME, MD;  Location: [facility];  Service: Urology;  Laterality: Left;   KNEE ARTHROSCOPY   02/25/2012   Procedure: ARTHROSCOPY KNEE;  Surgeon: Lemond Stable, MD;  Location: [facility];  Service: Orthopedics;  Laterality: Right;   KNEE ARTHROSCOPY WITH MEDIAL MENISECTOMY Left 12/23/2014   Procedure: LEFT KNEE ARTHROSCOPY WITH PARTIAL MENISECTOMY;  Surgeon: Lemond Stable, MD;  Location: [facility];  Service: Orthopedics;  Laterality: Left;   POLYPECTOMY  10/25/2016   Procedure: POLYPECTOMY;  Surgeon: Claudis RAYMOND Rivet, MD;  [facility];  Service: Endoscopy;;  colon   SHOULDER ARTHROSCOPY  2004   left shoulder   SHOULDER ARTHROSCOPY  2008   right shoulder    Social History   Socioeconomic History   Marital status: Widowed    Spouse name: Not on file   Number of children: Not on file   Years of education: Not on file   Highest education level: Not on file  Occupational History   Not on file  Tobacco Use   Smoking status: Every Day    Current packs/day: 1.00    Average packs/day: 1 pack/day for 35.0 years (35.0 ttl pk-yrs)    Types: Cigarettes    Passive exposure: Current  Smokeless tobacco: Never  Vaping Use   Vaping status: Never Used  Substance and Sexual Activity   Alcohol use: No   Drug use: No   Sexual activity: Yes    Birth control/protection: None  Other Topics Concern   Not on file  Social History Narrative   Disabled   Right handed   Social Drivers of Health   Financial Resource Strain: Low Risk  (03/14/2023)   Overall Financial Resource Strain (CARDIA)    Difficulty of Paying Living Expenses: Not hard at all  Food Insecurity: No Food Insecurity (03/14/2023)   Hunger Vital Sign    Worried About Running Out of Food in the Last Year: Never true    Ran Out of Food in the Last Year: Never true  Transportation Needs: No Transportation Needs (03/14/2023)   PRAPARE - Administrator, Civil Service (Medical): No    Lack of Transportation (Non-Medical): No  Physical Activity: Insufficiently Active (03/14/2023)   Exercise Vital Sign    Days of  Exercise per Week: 7 days    Minutes of Exercise per Session: 20 min  Stress: No Stress Concern Present (03/14/2023)   [employer] of Occupational Health - Occupational Stress Questionnaire    Feeling of Stress : Not at all  Social Connections: Moderately Isolated (03/14/2023)   Social Connection and Isolation Panel    Frequency of Communication with Friends and Family: More than three times a week    Frequency of Social Gatherings with Friends and Family: More than three times a week    Attends Religious Services: 1 to 4 times per year    Active Member of [employer] or Organizations: No    Attends Banker Meetings: Never    Marital Status: Widowed    Family History  Problem Relation Age of Onset   Hypertension Father    Diabetes Father    Heart disease Father    Parkinson's disease Father    Hypertension Sister    Heart disease Brother        both brothers have heart disease   Heart disease Maternal Grandfather    Lupus Daughter    Neurofibromatosis Son    Parkinson's disease Paternal Aunt    Parkinson's disease Paternal Uncle    Heart attack Other     Outpatient Encounter Medications as of 03/16/2024  Medication Sig   acetaminophen  (TYLENOL ) 500 MG tablet Take 1,000 mg by mouth every 6 (six) hours as needed for moderate pain.   AIMOVIG 70 MG/ML SOAJ Inject 70 mg into the skin every 30 (thirty) days.   albuterol  (VENTOLIN  HFA) 108 (90 Base) MCG/ACT inhaler inhale TWO puffs by MOUTH every SIX hours as needed for shortness of breath   ALPRAZolam  (XANAX ) 0.5 MG tablet Take 0.5 mg by mouth 3 (three) times daily.   aspirin EC 325 MG tablet Take 325 mg by mouth at bedtime.   atorvastatin  (LIPITOR) 20 MG tablet Take 1 tablet (20 mg total) by mouth daily.   Blood Glucose Monitoring Suppl w/Device KIT Test glucose 4 times daily. E11.9   buPROPion  (WELLBUTRIN  SR) 150 MG 12 hr tablet Take 1 tablet (150 mg total) by mouth 2 (two) times daily.   fluticasone -salmeterol (ADVAIR)  250-50 MCG/ACT AEPB inhale ONE PUFF by MOUTH every 12 hours. <<rinse MOUTH AFTER use>>   furosemide  (LASIX ) 20 MG tablet TAKE ONE TABLET BY MOUTH ONCE DAILY   glucose blood (TRUE METRIX BLOOD GLUCOSE TEST) test strip check blood sugar FOUR TIMES  DAILY   hydrOXYzine  (ATARAX /VISTARIL ) 50 MG tablet Take 50 mg by mouth daily as needed for anxiety.   Insulin  Glargine (BASAGLAR  KWIKPEN) 100 UNIT/ML Inject 50 Units into the skin at bedtime.   Insulin  Pen Needle (PEN NEEDLES) 31G X 8 MM MISC Use to inject insulin  4 times daily   Insulin  Syringe-Needle U-100 (BD VEO INSULIN  SYRINGE U/F) 31G X 15/64 1 ML MISC AS DIRECTED   levETIRAcetam  (KEPPRA ) 500 MG tablet TAKE THREE TABLETS BY MOUTH TWICE DAILY   Mesalamine  (ASACOL ) 400 MG CPDR DR capsule TAKE ONE CAPSULE BY MOUTH FOUR TIMES DAILY   morphine  (MS CONTIN ) 30 MG 12 hr tablet Take 1 tablet (30 mg total) by mouth 2 (two) times daily. May fill 60 days after 09/30/2017   Multiple Vitamins-Minerals (CENTRUM PO) Take 1 tablet by mouth daily.   NOVOLOG  FLEXPEN 100 UNIT/ML FlexPen Inject 10-16 Units into the skin 3 (three) times daily with meals.   ondansetron  (ZOFRAN -ODT) 8 MG disintegrating tablet DISSOLVE ONE TABLET BY MOUTH EVERY 8 HOURS AS NEEDED FOR NAUSEA AND VOMITING   Oxycodone  HCl 10 MG TABS Take 1 tablet by mouth 4 times a day as needed for pain. Do not drive or operate machinery while on this medicine.   OZEMPIC , 0.25 OR 0.5 MG/DOSE, 2 MG/3ML SOPN INJECT 0.5MG  SUBCUTANEOUSLY ONCE WEEKLY   polyethylene glycol (MIRALAX ) 17 g packet Take 17 g by mouth daily.   QUEtiapine (SEROQUEL) 100 MG tablet Take 100 mg by mouth at bedtime.   sodium bicarbonate  650 MG tablet TAKE 1 TABLET BY MOUTH TWICE DAILY   tamsulosin  (FLOMAX ) 0.4 MG CAPS capsule Take 1 capsule (0.4 mg total) by mouth 2 (two) times daily.   traZODone (DESYREL) 100 MG tablet Take 100 mg by mouth at bedtime.   TRUEPLUS INSULIN  SYRINGE 31G X 5/16 1 ML MISC AS DIRECTED   venlafaxine  XR  (EFFEXOR -XR) 37.5 MG 24 hr capsule Take 1 capsule (37.5 mg total) by mouth daily.   vitamin C (ASCORBIC ACID) 500 MG tablet Take 500 mg by mouth daily.   doxycycline  (VIBRA -TABS) 100 MG tablet Take 1 tablet (100 mg total) by mouth 2 (two) times daily. (Patient not taking: Reported on 03/16/2024)   [DISCONTINUED] insulin  aspart (FIASP ) 100 UNIT/ML FlexTouch Pen Inject 10-16 Units into the skin with breakfast, with lunch, and with evening meal. (Patient not taking: Reported on 03/16/2024)   [DISCONTINUED] Insulin  Glargine (BASAGLAR  KWIKPEN) 100 UNIT/ML Inject 60 Units into the skin at bedtime.   [DISCONTINUED] Mesalamine  (ASACOL ) 400 MG CPDR DR capsule TAKE ONE CAPSULE BY MOUTH FOUR TIMES DAILY   No facility-administered encounter medications on file as of 03/16/2024.    ALLERGIES: Allergies  Allergen Reactions   Gabapentin Other (See Comments)    Increased blood sugar    VACCINATION STATUS: Immunization History  Administered Date(s) Administered   Influenza Split 04/01/2013   Influenza, Seasonal, Injecte, Preservative Fre 04/04/2023   Influenza,inj,Quad PF,6+ Mos 03/30/2014, 03/22/2015, 03/21/2016, 04/07/2017, 03/30/2018, 07/02/2021, 07/03/2022   Pneumococcal Polysaccharide-23 07/02/2018   Pneumococcal-Unspecified 06/26/2004    Diabetes He presents for his follow-up diabetic visit. He has type 2 diabetes mellitus. Onset time: diagnosed at approx age of 36. His disease course has been improving. There are no hypoglycemic associated symptoms. Associated symptoms include blurred vision and fatigue. There are no hypoglycemic complications. Symptoms are stable. Diabetic complications include a CVA, heart disease, nephropathy and peripheral neuropathy. Risk factors for coronary artery disease include tobacco exposure, sedentary lifestyle, male sex, obesity, hypertension, diabetes mellitus, dyslipidemia and  family history. Current diabetic treatment includes intensive insulin  program. He is compliant  with treatment most of the time. His weight is decreasing steadily. He is following a generally unhealthy diet. When asked about meal planning, he reported none. He has not had a previous visit with a dietitian. He rarely participates in exercise. His home blood glucose trend is decreasing steadily. His overall blood glucose range is 140-180 mg/dl. (He presents today with his meter and logs showing improving glycemic profile.  His POCT A1c today is 7.8%,  improving from last visit of 8.3%.   Analysis of his meter shows 7-day average of 117, 14-day average of 183, 30-day average of 171.  He was re-initiated on Ozempic  by his PCP and so far has not had any severe constipation from it.) An ACE inhibitor/angiotensin II receptor blocker is being taken. He does not see a podiatrist (saw in distant past).Eye exam is not current.     Review of systems  Constitutional: + decreasing body weight, current Body mass index is 41.44 kg/m. , no fatigue, no subjective hyperthermia, no subjective hypothermia Eyes: no blurry vision, no xerophthalmia ENT: no sore throat, no nodules palpated in throat, no dysphagia/odynophagia, no hoarseness Cardiovascular: no chest pain, no shortness of breath, no palpitations Respiratory: no cough, no shortness of breath Gastrointestinal: no nausea/vomiting/diarrhea, + mild intermittent constipation (worse on higher dose of Ozempic ) Musculoskeletal: no muscle/joint aches Skin: no rashes, no hyperemia Neurological: no tremors, no numbness, no tingling, no dizziness Psychiatric: no depression, no anxiety  Objective:     BP 132/78 (BP Location: Left Arm, Patient Position: Sitting, Cuff Size: Large)   Pulse 99   Ht 5' 7 (1.702 m)   Wt 264 lb 9.6 oz (120 kg)   BMI 41.44 kg/m   Wt Readings from Last 3 Encounters:  03/16/24 264 lb 9.6 oz (120 kg)  01/02/24 274 lb 6.4 oz (124.5 kg)  12/25/23 268 lb 11.9 oz (121.9 kg)     BP Readings from Last 3 Encounters:  03/16/24 132/78   01/02/24 (!) 141/89  12/30/23 107/72     Physical Exam- Limited  Constitutional:  Body mass index is 41.44 kg/m. , not in acute distress, normal state of mind Eyes:  EOMI, no exophthalmos Musculoskeletal: no gross deformities, strength intact in all four extremities, no gross restriction of joint movements Skin:  no rashes, + hyperemia to BLE, nicotinic discoloration to bilateral fingertips Neurological: no tremor with outstretched hands   Diabetic Foot Exam - Simple   No data filed     CMP ( most recent) CMP     Component Value Date/Time   NA 143 11/10/2023 1131   NA 141 04/16/2023 0900   K 4.6 11/10/2023 1131   CL 106 11/10/2023 1131   CO2 26 11/10/2023 1131   GLUCOSE 158 (H) 11/10/2023 1131   BUN 24 11/10/2023 1131   BUN 26 (H) 04/16/2023 0900   CREATININE 1.48 (H) 11/10/2023 1131   CALCIUM  9.3 11/10/2023 1131   CALCIUM  9.3 08/27/2023 1140   PROT 6.9 11/10/2023 1131   PROT 6.4 10/01/2021 1004   ALBUMIN 4.4 11/15/2022 0636   ALBUMIN 4.6 10/01/2021 1004   AST 15 11/10/2023 1131   ALT 13 11/10/2023 1131   ALKPHOS 82 11/15/2022 0636   BILITOT 0.3 11/10/2023 1131   BILITOT <0.2 10/01/2021 1004   GFRNONAA 50 (L) 11/15/2022 0636   GFRAA 98 03/28/2020 0939     Diabetic Labs (most recent): Lab Results  Component Value Date  HGBA1C 7.8 (A) 03/16/2024   HGBA1C 8.3 (A) 11/13/2023   HGBA1C 8.5 (A) 07/15/2023     Lipid Panel ( most recent) Lipid Panel     Component Value Date/Time   CHOL 155 10/02/2022 1139   CHOL 142 01/01/2022 1157   TRIG 340 (H) 10/02/2022 1139   HDL 35 (L) 10/02/2022 1139   HDL 45 01/01/2022 1157   CHOLHDL 4.4 10/02/2022 1139   VLDL 68 (H) 10/02/2022 1139   LDLCALC 52 10/02/2022 1139   LDLCALC 75 01/01/2022 1157   LABVLDL 22 01/01/2022 1157      Lab Results  Component Value Date   TSH 1.57 11/19/2022           Assessment & Plan:   1) Type 2 diabetes mellitus with hyperglycemia, with long-term current use of insulin   He  presents today with his meter and logs showing improving glycemic profile.  His POCT A1c today is 7.8%,  improving from last visit of 8.3%.   Analysis of his meter shows 7-day average of 117, 14-day average of 183, 30-day average of 171.  He was re-initiated on Ozempic  by his PCP and so far has not had any severe constipation from it.  - Shaun Harris has currently uncontrolled symptomatic type 2 DM since 58 years of age.   -Recent labs reviewed.    - I had a long discussion with him about the progressive nature of diabetes and the pathology behind its complications. -his diabetes is complicated by CKD stage 3a, ?CVA, tobacco abuse and he remains at a high risk for more acute and chronic complications which include CAD, CVA, CKD, retinopathy, and neuropathy. These are all discussed in detail with him.  The following Lifestyle Medicine recommendations according to American College of Lifestyle Medicine [facility]) were discussed and offered to patient and he agrees to start the journey:  A. Whole Foods, Plant-based plate comprising of fruits and vegetables, plant-based proteins, whole-grain carbohydrates was discussed in detail with the patient.   A list for source of those nutrients were also provided to the patient.  Patient will use only water or unsweetened tea for hydration. B.  The need to stay away from risky substances including alcohol, smoking; obtaining 7 to 9 hours of restorative sleep, at least 150 minutes of moderate intensity exercise weekly, the importance of healthy social connections,  and stress reduction techniques were discussed. C.  A full color page of  Calorie density of various food groups per pound showing examples of each food groups was provided to the patient.  - Nutritional counseling repeated at each appointment due to patients tendency to fall back in to old habits.  - The patient admits there is a room for improvement in their diet and drink choices. -  Suggestion is made  for the patient to avoid simple carbohydrates from their diet including Cakes, Sweet Desserts / Pastries, Ice Cream, Soda (diet and regular), Sweet Tea, Candies, Chips, Cookies, Sweet Pastries, Store Bought Juices, Alcohol in Excess of 1-2 drinks a day, Artificial Sweeteners, Coffee Creamer, and Sugar-free Products. This will help patient to have stable blood glucose profile and potentially avoid unintended weight gain.   - I encouraged the patient to switch to unprocessed or minimally processed complex starch and increased protein intake (animal or plant source), fruits, and vegetables.   - Patient is advised to stick to a routine mealtimes to eat 3 meals a day and avoid unnecessary snacks (to snack only to correct hypoglycemia).  -  I have approached him with the following individualized plan to manage his diabetes and patient agrees:   -He is advised to lower his Basaglar  (insurance pref) to 40 units SQ nightly (suspect liver dumping at night) and continue his Novolog  10-16 units TID with meals if glucose is above 90 and he is eating (Specific instructions on how to titrate insulin  dosage based on glucose readings given to patient in writing).   He can continue Ozempic  0.5 mg SQ weekly as he has tolerated this lower dose well without significant constipation.  Would advise AGAINST increasing his Ozempid dose as he is heavy smoker which increases his risk of pancreatitis on the GLP1 products.  -he is encouraged to continue monitoring glucose 4 times daily, before meals and before bed, and to call the clinic if he has readings less than 70 or above 300 for 3 tests in a row.  We did talk about possibility of adding CGM in future, he is not interested at this time.  - he is warned not to take insulin  without proper monitoring per orders. - Adjustment parameters are given to him for hypo and hyperglycemia in writing.  - Specific targets for  A1c; LDL, HDL, and Triglycerides were discussed with the  patient.  2) Blood Pressure /Hypertension:  his blood pressure is controlled to target, tight today.   he is advised to continue his current medications as prescribed by PCP/nephrologist.  3) Lipids/Hyperlipidemia:    Review of his recent lipid panel from 10/03/22 showed controlled LDL at 52 and significantly elevated triglycerides of 340 .  he is advised to continue Lipitor 20 mg daily at bedtime.  Side effects and precautions discussed with him.  We also discussed importance of low fat diet today.   4)  Weight/Diet:  his Body mass index is 41.44 kg/m.  -  clearly complicating his diabetes care.   he is a candidate for weight loss. I discussed with him the fact that loss of 5 - 10% of his  current body weight will have the most impact on his diabetes management.  Exercise, and detailed carbohydrates information provided  -  detailed on discharge instructions.  5) Chronic Care/Health Maintenance: -he is on ACEI/ARB and Statin medications and is encouraged to initiate and continue to follow up with Ophthalmology, Dentist, Podiatrist at least yearly or according to recommendations, and advised to QUIT SMOKING. I have recommended yearly flu vaccine and pneumonia vaccine at least every 5 years; moderate intensity exercise for up to 150 minutes weekly; and sleep for at least 7 hours a day.  - he is advised to maintain close follow up with Mcgourty, Jayce G, DO for primary care needs, as well as his other providers for optimal and coordinated care.     I spent  34  minutes in the care of the patient today including review of labs from CMP, Lipids, Thyroid  Function, Hematology (current and previous including abstractions from other facilities); face-to-face time discussing  his blood glucose readings/logs, discussing hypoglycemia and hyperglycemia episodes and symptoms, medications doses, his options of short and long term treatment based on the latest standards of care / guidelines;  discussion about  incorporating lifestyle medicine;  and documenting the encounter. Risk reduction counseling performed per USPSTF guidelines to reduce obesity and cardiovascular risk factors.     Please refer to Patient Instructions for Blood Glucose Monitoring and Insulin /Medications Dosing Guide  in media tab for additional information. Please  also refer to  Patient Self Inventory in  the Media  tab for reviewed elements of pertinent patient history.  Debby KANDICE Ahle participated in the discussions, expressed understanding, and voiced agreement with the above plans.  All questions were answered to his satisfaction. he is encouraged to contact clinic should he have any questions or concerns prior to his return visit.     Follow up plan: - Return in about 4 months (around 07/16/2024) for Diabetes F/U with A1c in office, No previsit labs, Bring meter and logs.   Benton Rio, [place] [facility] [address] [place], [place] [address] Phone: [phone] Fax: [phone]  03/16/2024, 8:16 AM

## 2024-03-18 ENCOUNTER — Encounter (INDEPENDENT_AMBULATORY_CARE_PROVIDER_SITE_OTHER): Payer: Self-pay | Admitting: Gastroenterology

## 2024-03-19 ENCOUNTER — Ambulatory Visit: Payer: Medicare Other

## 2024-03-19 VITALS — Ht 67.0 in | Wt 264.0 lb

## 2024-03-19 DIAGNOSIS — Z0000 Encounter for general adult medical examination without abnormal findings: Secondary | ICD-10-CM | POA: Diagnosis not present

## 2024-03-19 NOTE — Progress Notes (Signed)
 Subjective:   Shaun Harris is a 58 y.o. who presents for a Medicare Wellness preventive visit.  As a reminder, Annual Wellness Visits don't include a physical exam, and some assessments may be limited, especially if this visit is performed virtually. We may recommend an in-person follow-up visit with your provider if needed.  Visit Complete: Virtual I connected with  Shaun Harris on 03/19/24 by a audio enabled telemedicine application and verified that I am speaking with the correct person using two identifiers.  Patient Location: Home  Provider Location: Home Office  I discussed the limitations of evaluation and management by telemedicine. The patient expressed understanding and agreed to proceed.  Vital Signs: Because this visit was a virtual/telehealth visit, some criteria may be missing or patient reported. Any vitals not documented were not able to be obtained and vitals that have been documented are patient reported.  VideoDeclined- This patient declined Librarian, academic. Therefore the visit was completed with audio only.  Persons Participating in Visit: Patient.  AWV Questionnaire: No: Patient Medicare AWV questionnaire was not completed prior to this visit.  Cardiac Risk Factors include: advanced age (>82men, >20 women);diabetes mellitus;dyslipidemia;hypertension;male gender;smoking/ tobacco exposure     Objective:    Today's Vitals   03/19/24 1351  Weight: 264 lb (119.7 kg)  Height: 5' 7 (1.702 m)   Body mass index is 41.35 kg/m.     03/19/2024    1:55 PM 12/30/2023    7:23 AM 12/25/2023    1:08 PM 03/14/2023    1:10 PM 11/19/2022    9:25 AM 11/15/2022    5:46 AM 08/03/2020   10:18 AM  Advanced Directives  Does Patient Have a Medical Advance Directive? No No No No No No No  Would patient like information on creating a medical advance directive? Yes (MAU/Ambulatory/Procedural Areas - Information given) No - Patient declined No - Patient  declined No - Patient declined  No - Patient declined No - Patient declined    Current Medications (verified) Outpatient Encounter Medications as of 03/19/2024  Medication Sig   acetaminophen  (TYLENOL ) 500 MG tablet Take 1,000 mg by mouth every 6 (six) hours as needed for moderate pain.   AIMOVIG 70 MG/ML SOAJ Inject 70 mg into the skin every 30 (thirty) days.   albuterol  (VENTOLIN  HFA) 108 (90 Base) MCG/ACT inhaler inhale TWO puffs by MOUTH every SIX hours as needed for shortness of breath   ALPRAZolam  (XANAX ) 0.5 MG tablet Take 0.5 mg by mouth 3 (three) times daily.   aspirin EC 325 MG tablet Take 325 mg by mouth at bedtime.   atorvastatin  (LIPITOR) 20 MG tablet Take 1 tablet (20 mg total) by mouth daily.   Blood Glucose Monitoring Suppl w/Device KIT Test glucose 4 times daily. E11.9   buPROPion  (WELLBUTRIN  SR) 150 MG 12 hr tablet Take 1 tablet (150 mg total) by mouth 2 (two) times daily.   doxycycline  (VIBRA -TABS) 100 MG tablet Take 1 tablet (100 mg total) by mouth 2 (two) times daily. (Patient not taking: Reported on 03/16/2024)   fluticasone -salmeterol (ADVAIR) 250-50 MCG/ACT AEPB inhale ONE PUFF by MOUTH every 12 hours. <<rinse MOUTH AFTER use>>   furosemide  (LASIX ) 20 MG tablet TAKE ONE TABLET BY MOUTH ONCE DAILY   glucose blood (TRUE METRIX BLOOD GLUCOSE TEST) test strip check blood sugar FOUR TIMES DAILY   hydrOXYzine  (ATARAX /VISTARIL ) 50 MG tablet Take 50 mg by mouth daily as needed for anxiety.   Insulin  Glargine (BASAGLAR  KWIKPEN) 100 UNIT/ML  Inject 50 Units into the skin at bedtime.   Insulin  Pen Needle (PEN NEEDLES) 31G X 8 MM MISC Use to inject insulin  4 times daily   Insulin  Syringe-Needle U-100 (BD VEO INSULIN  SYRINGE U/F) 31G X 15/64 1 ML MISC AS DIRECTED   levETIRAcetam  (KEPPRA ) 500 MG tablet TAKE THREE TABLETS BY MOUTH TWICE DAILY   Mesalamine  (ASACOL ) 400 MG CPDR DR capsule TAKE ONE CAPSULE BY MOUTH FOUR TIMES DAILY   morphine  (MS CONTIN ) 30 MG 12 hr tablet Take 1 tablet (30  mg total) by mouth 2 (two) times daily. May fill 60 days after 09/30/2017   Multiple Vitamins-Minerals (CENTRUM PO) Take 1 tablet by mouth daily.   NOVOLOG  FLEXPEN 100 UNIT/ML FlexPen Inject 10-16 Units into the skin 3 (three) times daily with meals.   ondansetron  (ZOFRAN -ODT) 8 MG disintegrating tablet DISSOLVE ONE TABLET BY MOUTH EVERY 8 HOURS AS NEEDED FOR NAUSEA AND VOMITING   Oxycodone  HCl 10 MG TABS Take 1 tablet by mouth 4 times a day as needed for pain. Do not drive or operate machinery while on this medicine.   OZEMPIC , 0.25 OR 0.5 MG/DOSE, 2 MG/3ML SOPN INJECT 0.5MG  SUBCUTANEOUSLY ONCE WEEKLY   polyethylene glycol (MIRALAX ) 17 g packet Take 17 g by mouth daily.   QUEtiapine (SEROQUEL) 100 MG tablet Take 100 mg by mouth at bedtime.   sodium bicarbonate  650 MG tablet TAKE 1 TABLET BY MOUTH TWICE DAILY   tamsulosin  (FLOMAX ) 0.4 MG CAPS capsule Take 1 capsule (0.4 mg total) by mouth 2 (two) times daily.   traZODone (DESYREL) 100 MG tablet Take 100 mg by mouth at bedtime.   TRUEPLUS INSULIN  SYRINGE 31G X 5/16 1 ML MISC AS DIRECTED   venlafaxine  XR (EFFEXOR -XR) 37.5 MG 24 hr capsule Take 1 capsule (37.5 mg total) by mouth daily.   vitamin C (ASCORBIC ACID) 500 MG tablet Take 500 mg by mouth daily.   No facility-administered encounter medications on file as of 03/19/2024.    Allergies (verified) Gabapentin   History: Past Medical History:  Diagnosis Date   Anxiety    Arthritis    Cerebral vasculitis    Chronic headaches    constant since possible stroke in 1997,Vasculitis of brain with increased pressure of brain .   COPD (chronic obstructive pulmonary disease) (HCC)    COPD (chronic obstructive pulmonary disease) (HCC)    Diabetes mellitus    Hyperlipidemia    Hypertension    Hypertriglyceridemia    Sleep apnea    has but doesnt use, cannot tolerate; PCP aware   Stroke [facility])    pt states,they arent sure if i had a stroke in 1997, there is still a question about that with  my doctors. No deficits.   Tobacco abuse    Tremor 10/02/2022   Ulcerative colitis (HCC)    Ulcerative colitis (HCC)    Venous stasis    Past Surgical History:  Procedure Laterality Date   BIOPSY  10/25/2016   Procedure: BIOPSY;  Surgeon: Claudis RAYMOND Rivet, MD;  [facility];  Service: Endoscopy;;  colon   CERVICAL SPINE SURGERY  2008   x2-last surgery 04/24/2016.   COLONOSCOPY N/A 12/30/2023   Procedure: COLONOSCOPY;  Surgeon: Eartha Angelia Sieving, MD;  Location: [facility];  Service: Gastroenterology;  Laterality: N/A;  8:15AM;ASA 3   COLONOSCOPY WITH PROPOFOL  N/A 10/25/2016   Procedure: COLONOSCOPY WITH PROPOFOL ;  Surgeon: Claudis RAYMOND Rivet, MD;  [facility];  Service: Endoscopy;  Laterality: N/A;  7:30  EXTRACORPOREAL SHOCK WAVE LITHOTRIPSY Left 08/08/2020   Procedure: EXTRACORPOREAL SHOCK WAVE LITHOTRIPSY (ESWL);  Surgeon: Sherrilee Belvie CROME, MD;  Location: [facility];  Service: Urology;  Laterality: Left;   KNEE ARTHROSCOPY  02/25/2012   Procedure: ARTHROSCOPY KNEE;  Surgeon: Lemond Stable, MD;  Location: [facility];  Service: Orthopedics;  Laterality: Right;   KNEE ARTHROSCOPY WITH MEDIAL MENISECTOMY Left 12/23/2014   Procedure: LEFT KNEE ARTHROSCOPY WITH PARTIAL MENISECTOMY;  Surgeon: Lemond Stable, MD;  Location: [facility];  Service: Orthopedics;  Laterality: Left;   POLYPECTOMY  10/25/2016   Procedure: POLYPECTOMY;  Surgeon: Claudis RAYMOND Rivet, MD;  [facility];  Service: Endoscopy;;  colon   SHOULDER ARTHROSCOPY  2004   left shoulder   SHOULDER ARTHROSCOPY  2008   right shoulder   Family History  Problem Relation Age of Onset   Hypertension Father    Diabetes Father    Heart disease Father    Parkinson's disease Father    Hypertension Sister    Heart disease Brother        both brothers have heart disease   Heart disease Maternal Grandfather    Lupus Daughter    Neurofibromatosis Son    Parkinson's disease Paternal Aunt    Parkinson's disease  Paternal Uncle    Heart attack Other    Social History   Socioeconomic History   Marital status: Widowed    Spouse name: Not on file   Number of children: Not on file   Years of education: Not on file   Highest education level: Not on file  Occupational History   Not on file  Tobacco Use   Smoking status: Every Day    Current packs/day: 1.00    Average packs/day: 1 pack/day for 35.0 years (35.0 ttl pk-yrs)    Types: Cigarettes    Passive exposure: Current   Smokeless tobacco: Never  Vaping Use   Vaping status: Never Used  Substance and Sexual Activity   Alcohol use: No   Drug use: No   Sexual activity: Yes    Birth control/protection: None  Other Topics Concern   Not on file  Social History Narrative   Disabled   Right handed   Social Drivers of Health   Financial Resource Strain: Low Risk  (03/19/2024)   Overall Financial Resource Strain (CARDIA)    Difficulty of Paying Living Expenses: Not hard at all  Food Insecurity: No Food Insecurity (03/19/2024)   Hunger Vital Sign    Worried About Running Out of Food in the Last Year: Never true    Ran Out of Food in the Last Year: Never true  Transportation Needs: No Transportation Needs (03/19/2024)   PRAPARE - Administrator, Civil Service (Medical): No    Lack of Transportation (Non-Medical): No  Physical Activity: Insufficiently Active (03/19/2024)   Exercise Vital Sign    Days of Exercise per Week: 3 days    Minutes of Exercise per Session: 30 min  Stress: No Stress Concern Present (03/19/2024)   [employer] of Occupational Health - Occupational Stress Questionnaire    Feeling of Stress: Not at all  Social Connections: Moderately Isolated (03/19/2024)   Social Connection and Isolation Panel    Frequency of Communication with Friends and Family: More than three times a week    Frequency of Social Gatherings with Friends and Family: Three times a week    Attends Religious Services: 1 to 4 times per year     Active Member  of Clubs or Organizations: No    Attends Banker Meetings: Never    Marital Status: Widowed    Tobacco Counseling Ready to quit: Not Answered Counseling given: Not Answered    Clinical Intake:  Pre-visit preparation completed: Yes  Pain : 0-10 Pain Type: Chronic pain  Diabetes: Yes CBG done?: No Did pt. bring in CBG monitor from home?: No  Lab Results  Component Value Date   HGBA1C 7.8 (A) 03/16/2024   HGBA1C 8.3 (A) 11/13/2023   HGBA1C 8.5 (A) 07/15/2023     How often do you need to have someone help you when you read instructions, pamphlets, or other written materials from your doctor or pharmacy?: 1 - Never  Interpreter Needed?: No  Information entered by :: Charmaine Bloodgood LPN   Activities of Daily Living     03/19/2024    1:52 PM 12/25/2023    1:08 PM  In your present state of health, do you have any difficulty performing the following activities:  Hearing? 0 0  Vision? 0 0  Difficulty concentrating or making decisions? 0 0  Walking or climbing stairs? 0   Dressing or bathing? 0   Doing errands, shopping? 0   Preparing Food and eating ? N   Using the Toilet? N   In the past six months, have you accidently leaked urine? N   Do you have problems with loss of bowel control? N   Managing your Medications? N   Managing your Finances? N   Housekeeping or managing your Housekeeping? N     Patient Care Team: Mazor, Jayce G, DO as PCP - General (Family Medicine) McKenzie, Belvie CROME, MD as Consulting Physician (Urology) Rachele Gaynell RAMAN, MD as Consulting Physician (Nephrology) Therisa Benton PARAS, NP as Nurse Practitioner (Nurse Practitioner) Mariette Mitzie CROME, NP as Nurse Practitioner (Gastroenterology) Tat, Asberry RAMAN, DO as Consulting Physician (Neurology) [facility] Coleman, Mayann G, FNP (Psychiatry) Jerel Gee, NP as Nurse Practitioner (Pain Medicine)  I have updated your Care Teams any recent Medical  Services you may have received from other providers in the past year.     Assessment:   This is a routine wellness examination for [facility].  Hearing/Vision screen Hearing Screening - Comments:: Denies hearing difficulties   Vision Screening - Comments::  up to date with routine eye exams with [facility]    Goals Addressed             This Visit's Progress    Maintain health   On track      Depression Screen     03/19/2024    1:53 PM 07/04/2023   10:26 AM 04/04/2023   11:17 AM 03/14/2023    1:12 PM 01/02/2023   10:10 AM 10/02/2022   10:13 AM 07/03/2022    9:41 AM  PHQ 2/9 Scores  PHQ - 2 Score 2 2 1  0 3 2 2   PHQ- 9 Score 5 5 5  8 7 4     Fall Risk     03/19/2024    1:56 PM 05/01/2023   11:03 AM 04/04/2023   11:16 AM 03/14/2023    1:11 PM 01/02/2023   10:10 AM  Fall Risk   Falls in the past year? 0 0 0 0 0  Number falls in past yr: 0 0 0 0 0  Injury with Fall? 0 0 0 0 0  Risk for fall due to : No Fall Risks   No Fall Risks No Fall  Risks  Follow up Falls prevention discussed;Education provided;Falls evaluation completed   Falls prevention discussed Falls evaluation completed    MEDICARE RISK AT HOME:  Medicare Risk at Home Any stairs in or around the home?: No If so, are there any without handrails?: No Home free of loose throw rugs in walkways, pet beds, electrical cords, etc?: Yes Adequate lighting in your home to reduce risk of falls?: Yes Life alert?: No Use of a cane, walker or w/c?: No Grab bars in the bathroom?: Yes Shower chair or bench in shower?: No Elevated toilet seat or a handicapped toilet?: No  TIMED UP AND GO:  Was the test performed?  No  Cognitive Function: 6CIT completed        03/19/2024    1:56 PM 03/14/2023    1:12 PM  6CIT Screen  What Year? 0 points 0 points  What month? 0 points 0 points  What time? 0 points 0 points  Count back from 20 0 points 0 points  Months in reverse 0 points 0 points  Repeat phrase 0 points 0 points   Total Score 0 points 0 points    Immunizations Immunization History  Administered Date(s) Administered   Influenza Split 04/01/2013   Influenza, Seasonal, Injecte, Preservative Fre 04/04/2023   Influenza,inj,Quad PF,6+ Mos 03/30/2014, 03/22/2015, 03/21/2016, 04/07/2017, 03/30/2018, 07/12/2020, 07/02/2021, 07/03/2022   Influenza-Unspecified 04/14/2015, 03/30/2018, 04/22/2019   Pneumococcal Polysaccharide-23 07/02/2018   Pneumococcal-Unspecified 06/26/2004    Screening Tests Health Maintenance  Topic Date Due   DTaP/Tdap/Td (1 - Tdap) Never done   Hepatitis B Vaccines 19-59 Average Risk (1 of 3 - 19+ 3-dose series) Never done   Pneumococcal Vaccine: 50+ Years (2 of 2 - PCV) 07/03/2019   Influenza Vaccine  02/13/2024   Zoster Vaccines- Shingrix (1 of 2) 06/24/2024 (Originally 01/22/1985)   FOOT EXAM  04/03/2024   Diabetic kidney evaluation - Urine ACR  07/03/2024   OPHTHALMOLOGY EXAM  07/23/2024   HEMOGLOBIN A1C  09/13/2024   Diabetic kidney evaluation - eGFR measurement  11/09/2024   Lung Cancer Screening  12/15/2024   Medicare Annual Wellness (AWV)  03/19/2025   Colonoscopy  12/30/2026   Hepatitis C Screening  Completed   HIV Screening  Completed   HPV VACCINES  Aged Out   Meningococcal B Vaccine  Aged Out   COVID-19 Vaccine  Discontinued    Health Maintenance Items Addressed: Information provided on vaccine recommendations   Additional Screening:  Vision Screening: Recommended annual ophthalmology exams for early detection of glaucoma and other disorders of the eye. Is the patient up to date with their annual eye exam?  Yes  Who is the provider or what is the name of the office in which the patient attends annual eye exams? [facility]   Dental Screening: Recommended annual dental exams for proper oral hygiene  Community Resource Referral / Chronic Care Management: CRR required this visit?  No   CCM required this visit?  No   Plan:    I have personally  reviewed and noted the following in the patient's chart:   Medical and social history Use of alcohol, tobacco or illicit drugs  Current medications and supplements including opioid prescriptions. Patient is currently taking opioid prescriptions. Information provided to patient regarding non-opioid alternatives. Patient advised to discuss non-opioid treatment plan with their provider. Functional ability and status Nutritional status Physical activity Advanced directives List of other physicians Hospitalizations, surgeries, and ER visits in previous 12 months Vitals Screenings to include cognitive, depression,  and falls Referrals and appointments  In addition, I have reviewed and discussed with patient certain preventive protocols, quality metrics, and best practice recommendations. A written personalized care plan for preventive services as well as general preventive health recommendations were provided to patient.   Lavelle Pfeiffer [place], [place]   [id number]   After Visit Summary: (Pick Up) Due to this being a telephonic visit, with patients personalized plan was offered to patient and patient has requested to Pick up at office.  Notes: Nothing significant to report at this time.

## 2024-03-19 NOTE — Patient Instructions (Signed)
 Mr. Shaun Harris,  Thank you for taking the time for your Medicare Wellness Visit. I appreciate your continued commitment to your health goals. Please review the care plan we discussed, and feel free to reach out if I can assist you further.  Medicare recommends these wellness visits once per year to help you and your care team stay ahead of potential health issues. These visits are designed to focus on prevention, allowing your provider to concentrate on managing your acute and chronic conditions during your regular appointments.  Please note that Annual Wellness Visits do not include a physical exam. Some assessments may be limited, especially if the visit was conducted virtually. If needed, we may recommend a separate in-person follow-up with your provider.  Ongoing Care Seeing your primary care provider every 3 to 6 months helps us  monitor your health and provide consistent, personalized care.   Referrals If a referral was made during today's visit and you haven't received any updates within two weeks, please contact the referred provider directly to check on the status.  Recommended Screenings:  Health Maintenance  Topic Date Due   DTaP/Tdap/Td vaccine (1 - Tdap) Never done   Hepatitis B Vaccine (1 of 3 - 19+ 3-dose series) Never done   Pneumococcal Vaccine for age over 68 (2 of 2 - PCV) 07/03/2019   Flu Shot  02/13/2024   Zoster (Shingles) Vaccine (1 of 2) 06/24/2024*   Complete foot exam   04/03/2024   Yearly kidney health urinalysis for diabetes  07/03/2024   Eye exam for diabetics  07/23/2024   Hemoglobin A1C  09/13/2024   Yearly kidney function blood test for diabetes  11/09/2024   Screening for Lung Cancer  12/15/2024   Medicare Annual Wellness Visit  03/19/2025   Colon Cancer Screening  12/30/2026   Hepatitis C Screening  Completed   HIV Screening  Completed   HPV Vaccine  Aged Out   Meningitis B Vaccine  Aged Out   COVID-19 Vaccine  Discontinued  *Topic was postponed. The  date shown is not the original due date.       03/19/2024    1:55 PM  Advanced Directives  Does Patient Have a Medical Advance Directive? No  Would patient like information on creating a medical advance directive? Yes (MAU/Ambulatory/Procedural Areas - Information given)   Advance Care Planning is important because it: Ensures you receive medical care that aligns with your values, goals, and preferences. Provides guidance to your family and loved ones, reducing the emotional burden of decision-making during critical moments.  Information on Advanced Care Planning can be found at [place]  Secretary of [facility] Advance Health Care Directives Advance Health Care Directives ([email or web address]   Vision: Annual vision screenings are recommended for early detection of glaucoma, cataracts, and diabetic retinopathy. These exams can also reveal signs of chronic conditions such as diabetes and high blood pressure.  Dental: Annual dental screenings help detect early signs of oral cancer, gum disease, and other conditions linked to overall health, including heart disease and diabetes.  Please see the attached documents for additional preventive care recommendations.

## 2024-03-22 ENCOUNTER — Ambulatory Visit (HOSPITAL_COMMUNITY)
Admission: RE | Admit: 2024-03-22 | Discharge: 2024-03-22 | Disposition: A | Discharge status: Home / Self Care | Source: Ambulatory Visit | Attending: Urology

## 2024-03-22 DIAGNOSIS — N200 Calculus of kidney: Secondary | ICD-10-CM | POA: Insufficient documentation

## 2024-04-05 ENCOUNTER — Encounter: Payer: Self-pay | Admitting: Family Medicine

## 2024-04-05 ENCOUNTER — Encounter: Payer: Self-pay | Admitting: Urology

## 2024-04-05 ENCOUNTER — Ambulatory Visit (INDEPENDENT_AMBULATORY_CARE_PROVIDER_SITE_OTHER): Admitting: Family Medicine

## 2024-04-05 ENCOUNTER — Ambulatory Visit (INDEPENDENT_AMBULATORY_CARE_PROVIDER_SITE_OTHER): Admitting: Urology

## 2024-04-05 VITALS — BP 132/85 | Ht 67.0 in | Wt 262.0 lb

## 2024-04-05 VITALS — BP 132/80 | HR 97

## 2024-04-05 DIAGNOSIS — E1122 Type 2 diabetes mellitus with diabetic chronic kidney disease: Secondary | ICD-10-CM

## 2024-04-05 DIAGNOSIS — I10 Essential (primary) hypertension: Secondary | ICD-10-CM

## 2024-04-05 DIAGNOSIS — Z23 Encounter for immunization: Secondary | ICD-10-CM

## 2024-04-05 DIAGNOSIS — R3912 Poor urinary stream: Secondary | ICD-10-CM

## 2024-04-05 DIAGNOSIS — E785 Hyperlipidemia, unspecified: Secondary | ICD-10-CM | POA: Diagnosis not present

## 2024-04-05 DIAGNOSIS — N183 Chronic kidney disease, stage 3 unspecified: Secondary | ICD-10-CM

## 2024-04-05 DIAGNOSIS — N200 Calculus of kidney: Secondary | ICD-10-CM

## 2024-04-05 DIAGNOSIS — J449 Chronic obstructive pulmonary disease, unspecified: Secondary | ICD-10-CM | POA: Diagnosis not present

## 2024-04-05 DIAGNOSIS — Z794 Long term (current) use of insulin: Secondary | ICD-10-CM

## 2024-04-05 MED ORDER — ALFUZOSIN HCL ER 10 MG PO TB24: 10.0000 mg | ORAL_TABLET | Freq: Two times a day (BID) | ORAL | 11 refills | Status: DC

## 2024-04-05 MED ORDER — SODIUM BICARBONATE 650 MG PO TABS: 650.0000 mg | ORAL_TABLET | Freq: Two times a day (BID) | ORAL | 11 refills | Status: AC

## 2024-04-05 MED ORDER — TAMSULOSIN HCL 0.4 MG PO CAPS: 0.4000 mg | ORAL_CAPSULE | Freq: Two times a day (BID) | ORAL | 3 refills | Status: DC

## 2024-04-05 NOTE — Progress Notes (Unsigned)
 04/05/2024 3:27 PM   Shaun Harris 02-09-1966 [id number]  Referring provider: Selph, Shaun G, Harris [address] [place],  [place] [address]  Followup nephrolithiasis   HPI: Shaun Harris is a 58yo here for followup for nephrolithiasis and difficulty urinating. He passed multiple stones Saturday and brought them with him today. Renal US  9/8 shows no renal calculi and no hydronephrosis. He is having mild right flank pain.He remains on sodium bicarbonate .  IPSS 17 QOl 3 on flomax  0.4mg  daily. Mild straining to urinate. Nocturia 3x. Urine stream fair.    PMH: Past Medical History:  Diagnosis Date   Anxiety    Arthritis    Cerebral vasculitis    Chronic headaches    constant since possible stroke in 1997,Vasculitis of brain with increased pressure of brain .   COPD (chronic obstructive pulmonary disease) (HCC)    COPD (chronic obstructive pulmonary disease) (HCC)    Diabetes mellitus    Hyperlipidemia    Hypertension    Hypertriglyceridemia    Sleep apnea    has but doesnt use, cannot tolerate; PCP aware   Stroke [facility])    pt states,they arent sure if i had a stroke in 1997, there is still a question about that with my doctors. No deficits.   Tobacco abuse    Tremor 10/02/2022   Ulcerative colitis (HCC)    Ulcerative colitis (HCC)    Venous stasis     Surgical History: Past Surgical History:  Procedure Laterality Date   BIOPSY  10/25/2016   Procedure: BIOPSY;  Surgeon: Shaun RAYMOND Rivet, MD;  [facility];  Service: Endoscopy;;  colon   CERVICAL SPINE SURGERY  2008   x2-last surgery 04/24/2016.   COLONOSCOPY N/A 12/30/2023   Procedure: COLONOSCOPY;  Surgeon: Shaun Angelia Sieving, MD;  Location: [facility];  Service: Gastroenterology;  Laterality: N/A;  8:15AM;ASA 3   COLONOSCOPY WITH PROPOFOL  N/A 10/25/2016   Procedure: COLONOSCOPY WITH PROPOFOL ;  Surgeon: Shaun RAYMOND Rivet, MD;  [facility]: [facility];  Service: Endoscopy;  Laterality: N/A;  7:30    EXTRACORPOREAL SHOCK WAVE LITHOTRIPSY Left 08/08/2020   Procedure: EXTRACORPOREAL SHOCK WAVE LITHOTRIPSY (ESWL);  Surgeon: Shaun Belvie CROME, MD;  Location: [facility];  Service: Urology;  Laterality: Left;   KNEE ARTHROSCOPY  02/25/2012   Procedure: ARTHROSCOPY KNEE;  Surgeon: Shaun Stable, MD;  Location: [facility];  Service: Orthopedics;  Laterality: Right;   KNEE ARTHROSCOPY WITH MEDIAL MENISECTOMY Left 12/23/2014   Procedure: LEFT KNEE ARTHROSCOPY WITH PARTIAL MENISECTOMY;  Surgeon: Shaun Stable, MD;  Location: [facility];  Service: Orthopedics;  Laterality: Left;   POLYPECTOMY  10/25/2016   Procedure: POLYPECTOMY;  Surgeon: Shaun RAYMOND Rivet, MD;  [facility];  Service: Endoscopy;;  colon   SHOULDER ARTHROSCOPY  2004   left shoulder   SHOULDER ARTHROSCOPY  2008   right shoulder    Home Medications:  Allergies as of 04/05/2024       Reactions   Gabapentin Other (See Comments)   Increased blood sugar        Medication List        Accurate as of April 05, 2024  3:27 PM. If you have any questions, ask your nurse or doctor.          STOP taking these medications    doxycycline  100 MG tablet Commonly known as: VIBRA -TABS Stopped by: Shaun Harris       TAKE these medications    acetaminophen  500 MG  tablet Commonly known as: TYLENOL  Take 1,000 mg by mouth every 6 (six) hours as needed for moderate pain.   Aimovig 70 MG/ML Soaj Generic drug: Erenumab-aooe Inject 70 mg into the skin every 30 (thirty) days.   albuterol  108 (90 Base) MCG/ACT inhaler Commonly known as: Ventolin  HFA inhale TWO puffs by MOUTH every SIX hours as needed for shortness of breath   ALPRAZolam  0.5 MG tablet Commonly known as: XANAX  Take 0.5 mg by mouth 3 (three) times daily.   ascorbic acid 500 MG tablet Commonly known as: VITAMIN C Take 500 mg by mouth daily.   aspirin EC 325 MG tablet Take 325 mg by mouth at bedtime.   atorvastatin  20 MG tablet Commonly known as: LIPITOR Take  1 tablet (20 mg total) by mouth daily.   Basaglar  KwikPen 100 UNIT/ML Inject 50 Units into the skin at bedtime.   Blood Glucose Monitoring Suppl w/Device Kit Test glucose 4 times daily. E11.9   buPROPion  150 MG 12 hr tablet Commonly known as: WELLBUTRIN  SR Take 1 tablet (150 mg total) by mouth 2 (two) times daily.   CENTRUM PO Take 1 tablet by mouth daily.   fluticasone -salmeterol 250-50 MCG/ACT Aepb Commonly known as: ADVAIR inhale ONE PUFF by MOUTH every 12 hours. <<rinse MOUTH AFTER use>>   furosemide  20 MG tablet Commonly known as: LASIX  TAKE ONE TABLET BY MOUTH ONCE DAILY   hydrOXYzine  50 MG tablet Commonly known as: ATARAX  Take 50 mg by mouth daily as needed for anxiety.   levETIRAcetam  500 MG tablet Commonly known as: KEPPRA  TAKE THREE TABLETS BY MOUTH TWICE DAILY   Mesalamine  400 MG Cpdr DR capsule Commonly known as: ASACOL  TAKE ONE CAPSULE BY MOUTH FOUR TIMES DAILY   morphine  30 MG 12 hr tablet Commonly known as: MS CONTIN  Take 1 tablet (30 mg total) by mouth 2 (two) times daily. May fill 60 days after 09/30/2017   NovoLOG  FlexPen 100 UNIT/ML FlexPen Generic drug: insulin  aspart Inject 10-16 Units into the skin 3 (three) times daily with meals.   ondansetron  8 MG disintegrating tablet Commonly known as: ZOFRAN -ODT DISSOLVE ONE TABLET BY MOUTH EVERY 8 HOURS AS NEEDED FOR NAUSEA AND VOMITING   Oxycodone  HCl 10 MG Tabs Take 1 tablet by mouth 4 times a day as needed for pain. Harris not drive or operate machinery while on this medicine.   Ozempic  (0.25 or 0.5 MG/DOSE) 2 MG/3ML Sopn Generic drug: Semaglutide (0.25 or 0.5MG /DOS) INJECT 0.5MG  SUBCUTANEOUSLY ONCE WEEKLY   Pen Needles 31G X 8 MM Misc Use to inject insulin  4 times daily   polyethylene glycol 17 g packet Commonly known as: MiraLax  Take 17 g by mouth daily.   QUEtiapine 100 MG tablet Commonly known as: SEROQUEL Take 100 mg by mouth at bedtime.   sodium bicarbonate  650 MG tablet TAKE 1 TABLET  BY MOUTH TWICE DAILY   tamsulosin  0.4 MG Caps capsule Commonly known as: FLOMAX  Take 1 capsule (0.4 mg total) by mouth 2 (two) times daily.   traZODone 100 MG tablet Commonly known as: DESYREL Take 100 mg by mouth at bedtime.   True Metrix Blood Glucose Test test strip Generic drug: glucose blood check blood sugar FOUR TIMES DAILY   TRUEplus Insulin  Syringe 31G X 5/16 1 ML Misc Generic drug: Insulin  Syringe-Needle U-100 AS DIRECTED   BD Veo Insulin  Syringe U/F 31G X 15/64 1 ML Misc Generic drug: Insulin  Syringe-Needle U-100 AS DIRECTED   venlafaxine  XR 37.5 MG 24 hr capsule Commonly known as: EFFEXOR -XR Take  1 capsule (37.5 mg total) by mouth daily.        Allergies:  Allergies  Allergen Reactions   Gabapentin Other (See Comments)    Increased blood sugar    Family History: Family History  Problem Relation Age of Onset   Hypertension Father    Diabetes Father    Heart disease Father    Parkinson's disease Father    Hypertension Sister    Heart disease Brother        both brothers have heart disease   Heart disease Maternal Grandfather    Lupus Daughter    Neurofibromatosis Son    Parkinson's disease Paternal Aunt    Parkinson's disease Paternal Uncle    Heart attack Other     Social History:  reports that he has been smoking cigarettes. He has a 35 pack-year smoking history. He has been exposed to tobacco smoke. He has never used smokeless tobacco. He reports that he does not drink alcohol and does not use drugs.  ROS: All other review of systems were reviewed and are negative except what is noted above in HPI  Physical Exam: BP 132/80   Pulse 97   Constitutional:  Alert and oriented, No acute distress. HEENT: [place] AT, moist mucus membranes.  Trachea midline, no masses. Cardiovascular: No clubbing, cyanosis, or edema. Respiratory: Normal respiratory effort, no increased work of breathing. GI: Abdomen is soft, nontender, nondistended, no abdominal  masses GU: No CVA tenderness.  Lymph: No cervical or inguinal lymphadenopathy. Skin: No rashes, bruises or suspicious lesions. Neurologic: Grossly intact, no focal deficits, moving all 4 extremities. Psychiatric: Normal mood and affect.  Laboratory Data: Lab Results  Component Value Date   WBC 8.6 05/12/2023   HGB 14.4 05/12/2023   HCT 45.0 05/12/2023   MCV 90.4 05/12/2023   PLT 277 05/12/2023    Lab Results  Component Value Date   CREATININE 1.48 (H) 11/10/2023    No results found for: PSA  No results found for: TESTOSTERONE  Lab Results  Component Value Date   HGBA1C 7.8 (A) 03/16/2024    Urinalysis    Component Value Date/Time   COLORURINE YELLOW 11/15/2022 0802   APPEARANCEUR Clear 09/24/2023 1434   LABSPEC 1.019 11/15/2022 0802   PHURINE 6.0 11/15/2022 0802   GLUCOSEU Negative 09/24/2023 1434   HGBUR NEGATIVE 11/15/2022 0802   BILIRUBINUR Negative 09/24/2023 1434   KETONESUR NEGATIVE 11/15/2022 0802   PROTEINUR Negative 09/24/2023 1434   PROTEINUR NEGATIVE 11/15/2022 0802   UROBILINOGEN 0.2 12/21/2014 0845   NITRITE Negative 09/24/2023 1434   NITRITE NEGATIVE 11/15/2022 0802   LEUKOCYTESUR Trace (A) 09/24/2023 1434   LEUKOCYTESUR NEGATIVE 11/15/2022 0802    Lab Results  Component Value Date   LABMICR See below: 09/24/2023   WBCUA 0-5 09/24/2023   LABEPIT 0-10 09/24/2023   MUCUS Present 07/25/2020   BACTERIA None seen 09/24/2023    Pertinent Imaging: Renal US  03/22/2024: Images reviewed and discussed with the patient  Results for orders placed in visit on 12/31/22  DG Abd 1 View  Narrative CLINICAL DATA:  Kidney stone.  EXAM: ABDOMEN - 1 VIEW  COMPARISON:  09/20/2022.  FINDINGS: The bowel gas pattern is normal. No radio-opaque calculi or other significant radiographic abnormality are seen.  IMPRESSION: Negative.   Electronically Signed By: Fonda Field M.D. On: 01/05/2023 18:16  No results found for this or any previous  visit.  No results found for this or any previous visit.  No results found for this or  any previous visit.  Results for orders placed during the hospital encounter of 03/22/24  Ultrasound renal complete  Narrative CLINICAL DATA:  Follow-up examination for nephrolithiasis.  EXAM: RENAL / URINARY TRACT ULTRASOUND COMPLETE  COMPARISON:  Prior ultrasound from 09/16/2023 as well as prior CT from 09/15/2022.  FINDINGS: Right Kidney:  Renal measurements: 11.0 x 5.6 x 5.9 cm = volume: 188.0 mL. Renal echogenicity within normal limits. No nephrolithiasis or hydronephrosis. No focal renal mass.  Left Kidney:  Renal measurements: 11.8 x 7.2 x 5.1 cm = volume: 226.0 mL. Renal echogenicity within normal limits. No nephrolithiasis or hydronephrosis. No focal renal mass.  Bladder:  There is an apparent approximate 3 cm soft tissue lesion at the base of the bladder (image 43). No visible associated vascularity by sonography. Both jets are visualized.  Other:  None.  IMPRESSION: 1. Normal sonographic appearance of the kidneys. No nephrolithiasis or hydronephrosis. 2. Apparent 3 cm soft tissue lesion/density at the base of the bladder. While this is suspected to reflect prominent lobulation of the underlying prostate gland, a possible bladder mass could also have this appearance. Correlation with urinalysis and further evaluation with dedicated cross-sectional imaging of the abdomen and pelvis suggested for further evaluation.   Electronically Signed By: Morene Hoard M.D. On: 03/29/2024 03:05  No results found for this or any previous visit.  No results found for this or any previous visit.  Results for orders placed in visit on 09/11/22  CT RENAL STONE STUDY  Narrative CLINICAL DATA:  Bilateral flank pain, history of calculi  EXAM: CT ABDOMEN AND PELVIS WITHOUT CONTRAST  TECHNIQUE: Multidetector CT imaging of the abdomen and pelvis was  performed following the standard protocol without IV contrast.  RADIATION DOSE REDUCTION: This exam was performed according to the departmental dose-optimization program which includes automated exposure control, adjustment of the mA and/or kV according to patient size and/or use of iterative reconstruction technique.  COMPARISON:  09/14/2020 and radiographs from 12/26/2021  FINDINGS: Lower chest: Unremarkable  Hepatobiliary: Unremarkable  Pancreas: Unremarkable  Spleen: Unremarkable  Adrenals/Urinary Tract: Harris 3.7 by 4.1 cm right adrenal myelolipoma, image 19 series 2. No signs of interval hematoma. This lesion is benign and does not require further imaging workup.  Cluster calculi in the right kidney lower pole measuring up to 1.1 cm in long axis. There is also a 2 mm right kidney upper pole nonobstructive renal calculus.  Two 2 mm left renal calculi are present.  No hydronephrosis or hydroureter.  No ureteral or bladder calculus.  Stomach/Bowel: Unremarkable.  Normal appendix.  Vascular/Lymphatic: Atherosclerosis is present, including aortoiliac atherosclerotic disease.  Reproductive: Borderline prostatomegaly.  Other: No supplemental non-categorized findings.  Musculoskeletal: Mild lumbar spondylosis and degenerative disc disease.  IMPRESSION: 1. Bilateral nonobstructive nephrolithiasis. 2. Harris 4.1 cm right adrenal myelolipoma. This lesion is benign and does not require further imaging workup. 3. Borderline prostatomegaly. 4. Mild lumbar spondylosis and degenerative disc disease. 5. Aortic atherosclerosis.  Aortic Atherosclerosis (ICD10-I70.0).   Electronically Signed By: Ryan Salvage M.D. On: 09/11/2022 15:04   Assessment & Plan:    1. Nephrolithiasis (Primary) -continue sodium bicarbonate  -followup 6 monhts with a renal US  - Urinalysis, Routine w reflex microscopic  2. Weak urinary stream -uroxatral  10mg  BID   No follow-ups on  file.  Belvie Clara, MD  [facility] [place]

## 2024-04-05 NOTE — Patient Instructions (Signed)

## 2024-04-05 NOTE — Patient Instructions (Signed)
 Continue your medications.  Follow up in 6 months.  Take care  Dr. Adriana Simas

## 2024-04-06 LAB — MICROSCOPIC EXAMINATION

## 2024-04-06 LAB — URINALYSIS, ROUTINE W REFLEX MICROSCOPIC
Bilirubin, UA: NEGATIVE
Glucose, UA: NEGATIVE
Ketones, UA: NEGATIVE
Nitrite, UA: NEGATIVE
Protein,UA: NEGATIVE
RBC, UA: NEGATIVE
Specific Gravity, UA: 1.02 (ref 1.005–1.030)
Urobilinogen, Ur: 0.2 mg/dL (ref 0.2–1.0)
pH, UA: 6 (ref 5.0–7.5)

## 2024-04-06 MED ORDER — FUROSEMIDE 20 MG PO TABS: 20.0000 mg | ORAL_TABLET | Freq: Every day | ORAL | 3 refills | Status: AC

## 2024-04-06 MED ORDER — ATORVASTATIN CALCIUM 20 MG PO TABS: 20.0000 mg | ORAL_TABLET | Freq: Every day | ORAL | 3 refills | Status: DC

## 2024-04-06 NOTE — Assessment & Plan Note (Signed)
 Stable currently

## 2024-04-06 NOTE — Assessment & Plan Note (Signed)
 LDL at goal.  Continue statin.

## 2024-04-06 NOTE — Progress Notes (Signed)
 Subjective:  Patient ID: Shaun Harris, male    DOB: 1966-05-30  Age: 58 y.o. MRN: [id number]  CC:   Follow up  HPI:  58 year old male presents for follow up.  Overall stable.  BP stable.  Tolerating Ozempic . A1c down to 7.8 from 8.3. Continues to follow closely with Endo. Needs foot exam.  COPD stable. No ready to quit smoking.   Desires flu vaccine.  Patient Active Problem List   Diagnosis Date Noted   Long-term current use of mesalamine  05/12/2023   Migraine 04/04/2022   BPH (benign prostatic hyperplasia) 01/01/2022   Ulcerative colitis (HCC) 07/02/2021   Type 2 diabetes mellitus with diabetic chronic kidney disease (HCC) 07/02/2021   History of seizure 03/14/2021   Stage 3 chronic kidney disease (HCC) 02/06/2021   Nephrolithiasis 07/25/2020   COPD (chronic obstructive pulmonary disease) (HCC) 03/10/2013   Tobacco abuse 03/10/2013   Essential hypertension 10/01/2012   Hyperlipidemia 10/01/2012   Chronic pain syndrome 10/01/2012    Social Hx   Social History   Socioeconomic History   Marital status: Widowed    Spouse name: Not on file   Number of children: Not on file   Years of education: Not on file   Highest education level: Not on file  Occupational History   Not on file  Tobacco Use   Smoking status: Every Day    Current packs/day: 1.00    Average packs/day: 1 pack/day for 35.0 years (35.0 ttl pk-yrs)    Types: Cigarettes    Passive exposure: Current   Smokeless tobacco: Never  Vaping Use   Vaping status: Never Used  Substance and Sexual Activity   Alcohol use: No   Drug use: No   Sexual activity: Yes    Birth control/protection: None  Other Topics Concern   Not on file  Social History Narrative   Disabled   Right handed   Social Drivers of Health   Financial Resource Strain: Low Risk  (03/19/2024)   Overall Financial Resource Strain (CARDIA)    Difficulty of Paying Living Expenses: Not hard at all  Food Insecurity: No Food Insecurity  (03/19/2024)   Hunger Vital Sign    Worried About Running Out of Food in the Last Year: Never true    Ran Out of Food in the Last Year: Never true  Transportation Needs: No Transportation Needs (03/19/2024)   PRAPARE - Administrator, Civil Service (Medical): No    Lack of Transportation (Non-Medical): No  Physical Activity: Insufficiently Active (03/19/2024)   Exercise Vital Sign    Days of Exercise per Week: 3 days    Minutes of Exercise per Session: 30 min  Stress: No Stress Concern Present (03/19/2024)   [employer] of Occupational Health - Occupational Stress Questionnaire    Feeling of Stress: Not at all  Social Connections: Moderately Isolated (03/19/2024)   Social Connection and Isolation Panel    Frequency of Communication with Friends and Family: More than three times a week    Frequency of Social Gatherings with Friends and Family: Three times a week    Attends Religious Services: 1 to 4 times per year    Active Member of Clubs or Organizations: No    Attends Banker Meetings: Never    Marital Status: Widowed    Review of Systems Per HPI  Objective:  BP 132/85   Ht 5' 7 (1.702 m)   Wt 262 lb (118.8 kg)   BMI 41.04  kg/m      04/05/2024    3:12 PM 04/05/2024    1:04 PM 03/19/2024    1:51 PM  BP/Weight  Systolic BP 132 132 --  Diastolic BP 80 85 --  Wt. (Lbs)  262 264  BMI  41.04 kg/m2 41.35 kg/m2    Physical Exam Vitals and nursing note reviewed.  Constitutional:      Appearance: Normal appearance. He is obese.  HENT:     Head: Normocephalic and atraumatic.  Eyes:     General:        Right eye: No discharge.        Left eye: No discharge.     Conjunctiva/sclera: Conjunctivae normal.  Cardiovascular:     Rate and Rhythm: Normal rate and regular rhythm.  Pulmonary:     Effort: Pulmonary effort is normal.     Breath sounds: No rales.  Feet:     Comments: Diabetic foot exam performed today. See quality metrics  section.  Neurological:     Mental Status: He is alert.  Psychiatric:        Mood and Affect: Mood normal.        Behavior: Behavior normal.     Lab Results  Component Value Date   WBC 8.6 05/12/2023   HGB 14.4 05/12/2023   HCT 45.0 05/12/2023   PLT 277 05/12/2023   GLUCOSE 158 (H) 11/10/2023   CHOL 155 10/02/2022   TRIG 340 (H) 10/02/2022   HDL 35 (L) 10/02/2022   LDLCALC 52 10/02/2022   ALT 13 11/10/2023   AST 15 11/10/2023   NA 143 11/10/2023   K 4.6 11/10/2023   CL 106 11/10/2023   CREATININE 1.48 (H) 11/10/2023   BUN 24 11/10/2023   CO2 26 11/10/2023   TSH 1.57 11/19/2022   HGBA1C 7.8 (A) 03/16/2024     Assessment & Plan:  Essential hypertension Assessment & Plan: Stable. Continue current medications.   Influenza vaccine needed -     Flu vaccine trivalent PF, 6mos and older(Flulaval,Afluria,Fluarix,Fluzone )  Hyperlipidemia, unspecified hyperlipidemia type Assessment & Plan: LDL at goal. Continue statin.  Orders: -     Atorvastatin  Calcium ; Take 1 tablet (20 mg total) by mouth daily.  Dispense: 90 tablet; Refill: 3  Chronic obstructive pulmonary disease, unspecified COPD type (HCC) Assessment & Plan: Stable currently.   Type 2 diabetes mellitus with stage 3 chronic kidney disease, with long-term current use of insulin , unspecified whether stage 3a or 3b CKD (HCC) Assessment & Plan: Improving after restarting Ozempic .   Other orders -     Furosemide ; Take 1 tablet (20 mg total) by mouth daily.  Dispense: 90 tablet; Refill: 3    Follow-up:  6 months  Brisa Auth Bluford DO [facility]

## 2024-04-06 NOTE — Assessment & Plan Note (Signed)
 Improving after restarting Ozempic .

## 2024-04-06 NOTE — Assessment & Plan Note (Signed)
 Stable.  Continue current medications.

## 2024-04-14 LAB — STONE ANALYSIS
Calcium Oxalate Dihydrate: 20 %
Calcium Oxalate Monohydrate: 80 %
Weight Calculi: 58 mg

## 2024-04-21 ENCOUNTER — Other Ambulatory Visit: Payer: Self-pay | Admitting: Family Medicine

## 2024-04-21 DIAGNOSIS — N183 Chronic kidney disease, stage 3 unspecified: Secondary | ICD-10-CM

## 2024-04-22 ENCOUNTER — Other Ambulatory Visit: Payer: Self-pay | Admitting: Family Medicine

## 2024-04-22 DIAGNOSIS — E785 Hyperlipidemia, unspecified: Secondary | ICD-10-CM

## 2024-05-07 ENCOUNTER — Telehealth: Payer: Self-pay

## 2024-05-07 ENCOUNTER — Ambulatory Visit

## 2024-05-07 DIAGNOSIS — R3912 Poor urinary stream: Secondary | ICD-10-CM

## 2024-05-07 DIAGNOSIS — R339 Retention of urine, unspecified: Secondary | ICD-10-CM

## 2024-05-07 DIAGNOSIS — N200 Calculus of kidney: Secondary | ICD-10-CM

## 2024-05-07 DIAGNOSIS — R300 Dysuria: Secondary | ICD-10-CM

## 2024-05-07 LAB — URINALYSIS, ROUTINE W REFLEX MICROSCOPIC
Bilirubin, UA: NEGATIVE
Glucose, UA: NEGATIVE
Ketones, UA: NEGATIVE
Leukocytes,UA: NEGATIVE
Nitrite, UA: NEGATIVE
Protein,UA: NEGATIVE
RBC, UA: NEGATIVE
Specific Gravity, UA: 1.025 (ref 1.005–1.030)
Urobilinogen, Ur: 0.2 mg/dL (ref 0.2–1.0)
pH, UA: 5 (ref 5.0–7.5)

## 2024-05-07 MED ORDER — CIPROFLOXACIN HCL 500 MG PO TABS: 500.0000 mg | ORAL_TABLET | Freq: Once | ORAL | Status: DC

## 2024-05-07 MED ORDER — ALFUZOSIN HCL ER 10 MG PO TB24: 10.0000 mg | ORAL_TABLET | Freq: Every day | ORAL | 11 refills | Status: AC

## 2024-05-07 MED ORDER — TAMSULOSIN HCL 0.4 MG PO CAPS: 0.4000 mg | ORAL_CAPSULE | Freq: Two times a day (BID) | ORAL | 11 refills | Status: AC

## 2024-05-07 MED ORDER — CIPROFLOXACIN HCL 500 MG PO TABS: 500.0000 mg | ORAL_TABLET | Freq: Once | ORAL | 0 refills | Status: AC

## 2024-05-07 NOTE — Progress Notes (Signed)
 Bladder Scan completed today due to reason iof urinary retention  Patient cannot void prior to the bladder scan. Bladder scan result: 202  Performed By: Exie DASEN. CMA  Additional notes- Patient is scheduled to follow up with VT  Order received to complete in and out catherization for urine sample. Patient was cleaned and prepped in a sterle fashion with Betadinex3  A 14 fr catheter foley was inserted. Urine return was note 800 ml. Urine Dark yellow in color. Catheter was then removed. Patient tolerated procedure with no complications were noted.  Performed by Exie LANES CMA  Urine sent for routine urinalysis  Simple Catheter Placement  Due to urinary retention patient is present today for a foley cath placement.  Patient was cleaned and prepped in a sterile fashion with Betadinex3 . A 16 FR foley catheter was inserted, urine return was noted  20 ml, urine was Dark yellow in color.  The balloon was filled with 10cc of sterile water.  A bed bag was attached for drainage. Patient was also given a night bag to take home and was given instruction on how to change from one bag to another.  Patient was given instruction on proper catheter care.  Patient tolerated well, no complications were noted   Performed by: Exie DASEN. CMA  Additional notes/ Follow up: Per MD McKenzie in two weeks for bladder rest with a VT

## 2024-05-07 NOTE — Addendum Note (Signed)
 Addended by: SAMMIE EXIE HERO on: 05/07/2024 11:47 AM   Modules accepted: Orders

## 2024-05-07 NOTE — Telephone Encounter (Signed)
 Pt seen as nurse visit today see clinical support note for details

## 2024-05-07 NOTE — Telephone Encounter (Signed)
 Patient cannot urinate.  Has not urinated since yesterday. Having back left pain.    Please advise.  Call:  [phone]

## 2024-05-13 ENCOUNTER — Ambulatory Visit: Payer: Self-pay

## 2024-05-13 ENCOUNTER — Ambulatory Visit (INDEPENDENT_AMBULATORY_CARE_PROVIDER_SITE_OTHER): Admitting: Family Medicine

## 2024-05-13 ENCOUNTER — Encounter: Payer: Self-pay | Admitting: Family Medicine

## 2024-05-13 VITALS — BP 138/89 | HR 90 | Temp 97.9°F | Ht 67.0 in | Wt 257.0 lb

## 2024-05-13 DIAGNOSIS — G8929 Other chronic pain: Secondary | ICD-10-CM | POA: Diagnosis not present

## 2024-05-13 DIAGNOSIS — M5441 Lumbago with sciatica, right side: Secondary | ICD-10-CM

## 2024-05-13 MED ORDER — LIDOCAINE 5 % EX PTCH: 1.0000 | MEDICATED_PATCH | CUTANEOUS | 0 refills | Status: AC

## 2024-05-13 NOTE — Telephone Encounter (Signed)
 FYI Only or Action Required?: FYI only for provider: appointment scheduled on 05/19/24.  Patient was last seen in primary care on 05/13/2024 by Shaun Jeoffrey RAMAN, FNP.  Called Nurse Triage reporting Back Pain.  Symptoms began several days ago.  Interventions attempted: Prescription medications: oxycodone  and morphine .  Symptoms are: unchanged.  Triage Disposition: See HCP Within 4 Hours (Or PCP Triage)  Patient/caregiver understands and will follow disposition?: Yes     Copied from CRM #[id number]. Topic: Clinical - Red Word Triage >> May 13, 2024  4:02 PM Shaun Harris wrote: Red Word that prompted transfer to Nurse Triage: Patient needs an appt for his back ASAP. Back is hurting, right leg going numb and causing pain. Pain level 10 Reason for Disposition  [1] SEVERE back pain (e.g., excruciating, unable to do any normal activities) AND [2] not improved 2 hours after pain medicine  Answer Assessment - Initial Assessment Questions After RN triaged pt and tried to schedule for another location, he stated that's what I did today. He was seen at [facility] and states it was a waste of my time. RN asked why. He stated because he's on so many medications already she didn't want to add anymore. Pt is on oxycodone  and morphine  for headaches he stated. He said he just wants an appt with Dr. Bluford. RN scheduled appt and advised if any symptoms worsen or he can't tolerate the pain. Pt stated understanding.   1. ONSET: When did the pain begin? (e.g., minutes, hours, days)     3 or 4 days ago 2. LOCATION: Where does it hurt? (upper, mid or lower back)     Right lower back  3. SEVERITY: How bad is the pain?  (e.g., Scale 1-10; mild, moderate, or severe)     10 4. PATTERN: Is the pain constant? (e.g., yes, no; constant, intermittent)      constant 5. RADIATION: Does the pain shoot into your legs or somewhere else?     Pain radiates to right leg 6. CAUSE:  What do you think is causing the back  pain?      no 7. BACK OVERUSE:  Any recent lifting of heavy objects, strenuous work or exercise?     no 8. MEDICINES: What have you taken so far for the pain? (e.g., nothing, acetaminophen , NSAIDS)     Pt takes oyxcodone and morphine  9. NEUROLOGIC SYMPTOMS: Do you have any weakness, numbness, or problems with bowel/bladder control?     Numbness and weakness, catheter in  Thursday about the time back started hurting. Urologist did that.  10. OTHER SYMPTOMS: Do you have any other symptoms? (e.g., fever, abdomen pain, burning with urination, blood in urine)       no  Protocols used: Back Pain-A-AH

## 2024-05-13 NOTE — Progress Notes (Addendum)
 Acute Office Visit  Patient ID: Shaun Harris, male    DOB: 08-08-1965, 58 y.o.   MRN: [id number]  PCP: Mun, Jayce G, DO  Chief Complaint  Patient presents with   Acute Visit    Shooting, throbbing pain and numbness from Lower Back down rt leg X 3 days     Subjective:     HPI  Discussed the use of AI scribe software for clinical note transcription with the patient, who gave verbal consent to proceed.  History of Present Illness Shaun Harris is a 58 year old male with degenerative disc disease who presents with acute low back pain radiating to the right leg.  He has been experiencing low back pain for the past few days, which has recently intensified and radiates down his right leg, causing numbness extending to his toes. No recent falls or injuries have been reported.  He has a history of degenerative disc disease, confirmed by previous CT scans conducted for kidney issues. Although he has had disc problems for a while, they have not caused significant discomfort until now. He is currently using a catheter due to intermittent bladder dysfunction and has a history of kidney stones, but denies having a current kidney stone.  He is taking oxycodone  and morphine  for pain management, but the pain has persisted for a few days despite medication use.  During the review of symptoms, he denies experiencing saddle numbness, but notes some numbness on his right side.  Current Outpatient Medications on File Prior to Visit  Medication Sig Dispense Refill   acetaminophen  (TYLENOL ) 500 MG tablet Take 1,000 mg by mouth every 6 (six) hours as needed for moderate pain.     AIMOVIG 70 MG/ML SOAJ Inject 70 mg into the skin every 30 (thirty) days.     albuterol  (VENTOLIN  HFA) 108 (90 Base) MCG/ACT inhaler inhale TWO puffs by MOUTH every SIX hours as needed for shortness of breath 18 Harris 5   alfuzosin  (UROXATRAL ) 10 MG 24 hr tablet Take 1 tablet (10 mg total) by mouth daily. 30 tablet 11   ALPRAZolam   (XANAX ) 0.5 MG tablet Take 0.5 mg by mouth 3 (three) times daily.     aspirin EC 325 MG tablet Take 325 mg by mouth at bedtime.     atorvastatin  (LIPITOR) 20 MG tablet TAKE ONE TABLET BY MOUTH EVERY DAY 90 tablet 1   buPROPion  (WELLBUTRIN  SR) 150 MG 12 hr tablet Take 1 tablet (150 mg total) by mouth 2 (two) times daily. 180 tablet 1   fluticasone -salmeterol (ADVAIR) 250-50 MCG/ACT AEPB inhale ONE PUFF by MOUTH every 12 hours. <<rinse MOUTH AFTER use>> 180 each 1   furosemide  (LASIX ) 20 MG tablet Take 1 tablet (20 mg total) by mouth daily. 90 tablet 3   glucose blood (TRUE METRIX BLOOD GLUCOSE TEST) test strip check blood sugar FOUR TIMES DAILY 100 each 0   hydrOXYzine  (ATARAX /VISTARIL ) 50 MG tablet Take 50 mg by mouth daily as needed for anxiety.     Insulin  Glargine (BASAGLAR  KWIKPEN) 100 UNIT/ML Inject 50 Units into the skin at bedtime. 45 mL 1   Insulin  Pen Needle (PEN NEEDLES) 31G X 8 MM MISC Use to inject insulin  4 times daily 300 each 3   Insulin  Syringe-Needle U-100 (BD VEO INSULIN  SYRINGE U/F) 31G X 15/64 1 ML MISC AS DIRECTED 100 each 1   levETIRAcetam  (KEPPRA ) 500 MG tablet TAKE THREE TABLETS BY MOUTH TWICE DAILY 540 tablet 1   Mesalamine  (ASACOL ) 400 MG  CPDR DR capsule TAKE ONE CAPSULE BY MOUTH FOUR TIMES DAILY 360 capsule 1   morphine  (MS CONTIN ) 30 MG 12 hr tablet Take 1 tablet (30 mg total) by mouth 2 (two) times daily. May fill 60 days after 09/30/2017 60 tablet 0   Multiple Vitamins-Minerals (CENTRUM PO) Take 1 tablet by mouth daily.     NOVOLOG  FLEXPEN 100 UNIT/ML FlexPen Inject 10-16 Units into the skin 3 (three) times daily with meals.     ondansetron  (ZOFRAN -ODT) 8 MG disintegrating tablet DISSOLVE ONE TABLET BY MOUTH EVERY 8 HOURS AS NEEDED FOR NAUSEA AND VOMITING 20 tablet 6   Oxycodone  HCl 10 MG TABS Take 1 tablet by mouth 4 times a day as needed for pain. Do not drive or operate machinery while on this medicine. 120 tablet 0   OZEMPIC , 0.25 OR 0.5 MG/DOSE, 2 MG/3ML SOPN  INJECT 0.5MG  SUBCUTANEOUSLY ONCE WEEKLY 3 mL 1   polyethylene glycol (MIRALAX ) 17 Harris packet Take 17 Harris by mouth daily. 14 each 0   QUEtiapine (SEROQUEL) 100 MG tablet Take 100 mg by mouth at bedtime.     sodium bicarbonate  650 MG tablet Take 1 tablet (650 mg total) by mouth 2 (two) times daily. 60 tablet 11   tamsulosin  (FLOMAX ) 0.4 MG CAPS capsule Take 1 capsule (0.4 mg total) by mouth in the morning and at bedtime. 60 capsule 11   traZODone (DESYREL) 100 MG tablet Take 100 mg by mouth at bedtime.     TRUEPLUS INSULIN  SYRINGE 31G X 5/16 1 ML MISC AS DIRECTED 100 each 0   venlafaxine  XR (EFFEXOR -XR) 37.5 MG 24 hr capsule Take 1 capsule (37.5 mg total) by mouth daily. 90 capsule 3   vitamin C (ASCORBIC ACID) 500 MG tablet Take 500 mg by mouth daily.     No current facility-administered medications on file prior to visit.   Allergies  Allergen Reactions   Gabapentin Other (See Comments)    Increased blood sugar    Past Medical History:  Diagnosis Date   Anxiety    Arthritis    Cerebral vasculitis    Chronic headaches    constant since possible stroke in 1997,Vasculitis of brain with increased pressure of brain .   COPD (chronic obstructive pulmonary disease) (HCC)    COPD (chronic obstructive pulmonary disease) (HCC)    Diabetes mellitus    Hyperlipidemia    Hypertension    Hypertriglyceridemia    Sleep apnea    has but doesnt use, cannot tolerate; PCP aware   Stroke [facility])    pt states,they arent sure if i had a stroke in 1997, there is still a question about that with my doctors. No deficits.   Tobacco abuse    Tremor 10/02/2022   Ulcerative colitis (HCC)    Ulcerative colitis (HCC)    Venous stasis    Past Surgical History:  Procedure Laterality Date   BIOPSY  10/25/2016   Procedure: BIOPSY;  Surgeon: Claudis RAYMOND Rivet, MD;  [facility]: [facility];  Service: Endoscopy;;  colon   CERVICAL SPINE SURGERY  2008   x2-last surgery 04/24/2016.   COLONOSCOPY N/A 12/30/2023    Procedure: COLONOSCOPY;  Surgeon: Eartha Angelia Sieving, MD;  Location: [facility];  Service: Gastroenterology;  Laterality: N/A;  8:15AM;ASA 3   COLONOSCOPY WITH PROPOFOL  N/A 10/25/2016   Procedure: COLONOSCOPY WITH PROPOFOL ;  Surgeon: Claudis RAYMOND Rivet, MD;  [facility]: [facility];  Service: Endoscopy;  Laterality: N/A;  7:30   EXTRACORPOREAL SHOCK WAVE LITHOTRIPSY  Left 08/08/2020   Procedure: EXTRACORPOREAL SHOCK WAVE LITHOTRIPSY (ESWL);  Surgeon: Sherrilee Belvie CROME, MD;  Location: [facility];  Service: Urology;  Laterality: Left;   KNEE ARTHROSCOPY  02/25/2012   Procedure: ARTHROSCOPY KNEE;  Surgeon: Lemond Stable, MD;  Location: [facility];  Service: Orthopedics;  Laterality: Right;   KNEE ARTHROSCOPY WITH MEDIAL MENISECTOMY Left 12/23/2014   Procedure: LEFT KNEE ARTHROSCOPY WITH PARTIAL MENISECTOMY;  Surgeon: Lemond Stable, MD;  Location: [facility];  Service: Orthopedics;  Laterality: Left;   POLYPECTOMY  10/25/2016   Procedure: POLYPECTOMY;  Surgeon: Claudis RAYMOND Rivet, MD;  [facility];  Service: Endoscopy;;  colon   SHOULDER ARTHROSCOPY  2004   left shoulder   SHOULDER ARTHROSCOPY  2008   right shoulder     Review of Systems  All other systems reviewed and are negative.      Objective:    BP 138/89   Pulse 90   Temp 97.9 F (36.6 C)   Ht 5' 7 (1.702 m)   Wt 257 lb (116.6 kg)   SpO2 97%   BMI 40.25 kg/m    Physical Exam Vitals and nursing note reviewed.  Constitutional:      Appearance: Normal appearance. He is normal weight.  HENT:     Head: Normocephalic and atraumatic.  Musculoskeletal:     Lumbar back: No spasms, tenderness or bony tenderness. Positive right straight leg raise test.  Skin:    General: Skin is warm and dry.     Capillary Refill: Capillary refill takes less than 2 seconds.  Neurological:     General: No focal deficit present.     Mental Status: He is alert and oriented to person, place, and time. Mental status is at baseline.  Psychiatric:         Mood and Affect: Mood normal.        Behavior: Behavior normal.        Thought Content: Thought content normal.        Judgment: Judgment normal.       No results found for any visits on 05/13/24.     Assessment & Plan:   Problem List Items Addressed This Visit     Chronic right-sided low back pain with right-sided sciatica - Primary    Assessment and Plan Assessment & Plan Low back pain with right lower extremity radiculopathy and degenerative disc disease of lumbar spine Acute exacerbation likely due to degenerative disc disease. Numbness extends down the right leg. No significant weakness or saddle numbness. Caution with additional medications due to complex history. - Prescribe lidocaine  patches 12 hours on, 12 hours off. - Advise rest and heating pads. - Recommend follow-up with Doctor Bluford if symptoms persist. - Consider referral to physical therapy or orthopedist/neurosurgeon if no improvement. - Discuss potential need for updated imaging if symptoms continue.    Meds ordered this encounter  Medications   lidocaine  (LIDODERM ) 5 %    Sig: Place 1 patch onto the skin daily. Remove & Discard patch within 12 hours or as directed by MD    Dispense:  10 patch    Refill:  0    Supervising Provider:   DUANNE LOWERS T [[id number]]    Return if symptoms worsen or fail to improve.  Shaun GORMAN Barrio, FNP [facility]

## 2024-05-14 ENCOUNTER — Other Ambulatory Visit: Payer: Self-pay | Admitting: Family Medicine

## 2024-05-14 ENCOUNTER — Other Ambulatory Visit (HOSPITAL_COMMUNITY): Payer: Self-pay

## 2024-05-14 ENCOUNTER — Telehealth: Payer: Self-pay | Admitting: Pharmacy Technician

## 2024-05-14 NOTE — Telephone Encounter (Signed)
 Left a message for the patient to inform him per PA approval of lidocaine  patches

## 2024-05-14 NOTE — Telephone Encounter (Signed)
 Pharmacy Patient Advocate Encounter  Received notification from [facility] that Prior Authorization for Lidocaine  5% patches has been APPROVED from 05/14/24 to 05/14/25. Ran test claim, Copay is $0.00. This test claim was processed through [facility]- copay amounts may vary at other pharmacies due to pharmacy/plan contracts, or as the patient moves through the different stages of their insurance plan.   PA #/Case ID/Reference #: [id number]

## 2024-05-14 NOTE — Telephone Encounter (Signed)
 Pharmacy Patient Advocate Encounter   Received notification from CoverMyMeds that prior authorization for Lidocaine  5% patches is required/requested.   Insurance verification completed.   The patient is insured through [facility].   Per test claim: PA required; PA started via CoverMyMeds. KEY [id number] . Waiting for clinical questions to populate.

## 2024-05-17 ENCOUNTER — Other Ambulatory Visit (HOSPITAL_COMMUNITY): Payer: Self-pay

## 2024-05-18 ENCOUNTER — Ambulatory Visit (INDEPENDENT_AMBULATORY_CARE_PROVIDER_SITE_OTHER)

## 2024-05-18 ENCOUNTER — Other Ambulatory Visit: Payer: Self-pay

## 2024-05-18 DIAGNOSIS — R339 Retention of urine, unspecified: Secondary | ICD-10-CM | POA: Diagnosis not present

## 2024-05-18 LAB — BLADDER SCAN AMB NON-IMAGING: Scan Result: 399

## 2024-05-18 MED ORDER — CIPROFLOXACIN HCL 500 MG PO TABS
500.0000 mg | ORAL_TABLET | Freq: Once | ORAL | Status: AC
  Administered 2024-05-18: 500 mg via ORAL

## 2024-05-18 NOTE — Telephone Encounter (Addendum)
 Pt in office today for VT with no success.  This is the second VT performed  on pt in 4 weeks. Pt request cath to be replaced and he state's going to see DR. Assefa tomorrow about his back. Pt is aware this encounter will be sent to Dr. Sherrilee on advisement and how to proceed further . Pt voiced understanding

## 2024-05-18 NOTE — Progress Notes (Signed)
 Fill and Pull Catheter Removal  Patient is present today for a catheter removal due to urinary retention.  400 ml of sterile water was instilled into the bladder when the patient felt the urge to urinate. 10 ml of water was then drained from the balloon.  A 16 FR foley cath was removed from the bladder no complications were noted .  Foley catheter intact and time of removal. Patient as then given some time to void on their own.  Patient cannot void  0 ml on their own after some time.  Patient tolerated well.  One oral prophylactic antibiotic given per MD orders   Follow up/ Additional notes: Cath placement per Dr. Matilda  Bladder Scan completed today due to reason Urinary Retention  Patient cannot void prior to the bladder scan. Bladder scan result: 399   Simple Catheter Placement  Due to urinary retention patient is present today for a foley cath placement.  Patient was cleaned and prepped in a sterile fashion with Betadinex3 . A 16  FR foley catheter was inserted, urine return was noted  400 ml, urine was Clear yellow in color.  The balloon was filled with 10cc of sterile water.  A night bag was attached for drainage. Patient was also given a night bag to take home and was given instruction on how to change from one bag to another.  Patient was given instruction on proper catheter care.  Patient tolerated well, no complications were noted   Performed by: Carlos, CMA  Additional notes/ Follow up:

## 2024-05-19 ENCOUNTER — Ambulatory Visit (HOSPITAL_COMMUNITY)
Admission: RE | Admit: 2024-05-19 | Discharge: 2024-05-19 | Disposition: A | Discharge status: Home / Self Care | Source: Ambulatory Visit | Attending: Family Medicine | Admitting: Family Medicine

## 2024-05-19 ENCOUNTER — Ambulatory Visit (INDEPENDENT_AMBULATORY_CARE_PROVIDER_SITE_OTHER): Payer: Self-pay | Admitting: Family Medicine

## 2024-05-19 ENCOUNTER — Other Ambulatory Visit: Payer: Self-pay | Admitting: Family Medicine

## 2024-05-19 ENCOUNTER — Encounter: Payer: Self-pay | Admitting: Family Medicine

## 2024-05-19 ENCOUNTER — Ambulatory Visit: Payer: Self-pay | Admitting: Family Medicine

## 2024-05-19 VITALS — BP 134/66 | HR 110 | Ht 67.0 in | Wt 259.0 lb

## 2024-05-19 DIAGNOSIS — G8929 Other chronic pain: Secondary | ICD-10-CM | POA: Insufficient documentation

## 2024-05-19 DIAGNOSIS — M5441 Lumbago with sciatica, right side: Secondary | ICD-10-CM | POA: Diagnosis present

## 2024-05-19 MED ORDER — PREDNISONE 20 MG PO TABS: 40.0000 mg | ORAL_TABLET | Freq: Every day | ORAL | 0 refills | Status: AC

## 2024-05-19 MED ORDER — CYCLOBENZAPRINE HCL 10 MG PO TABS: 10.0000 mg | ORAL_TABLET | Freq: Three times a day (TID) | ORAL | 0 refills | Status: DC | PRN

## 2024-05-19 NOTE — Progress Notes (Signed)
 Subjective:  Patient ID: Shaun Harris, male    DOB: [birth year]  Age: 58 y.o. MRN: [id number]  CC:   Chief Complaint  Patient presents with   Back Pain    Lower back pain for one week     HPI:  58 year old male with an extensive past medical history presents for evaluation of the above.  Has chronic pain syndrome.  Followed by pain management.  Reports 1 week history of worsening low back pain on the right side.  He reports that this radiates down his right leg.  He is also had some urinary retention which has required catheter placement.  Patient is not getting any relief regarding his pain despite his pain medication.  He has tried Zanaflex  as well.  He was also recently prescribed lidocaine  patches without resolution.  He is not endorsing any saddle anesthesia.  Has a history of urinary retention.  Patient Active Problem List   Diagnosis Date Noted   Chronic right-sided low back pain with right-sided sciatica 05/13/2024   Long-term current use of mesalamine  05/12/2023   Migraine 04/04/2022   BPH (benign prostatic hyperplasia) 01/01/2022   Ulcerative colitis (HCC) 07/02/2021   Type 2 diabetes mellitus with diabetic chronic kidney disease (HCC) 07/02/2021   History of seizure 03/14/2021   Stage 3 chronic kidney disease (HCC) 02/06/2021   Nephrolithiasis 07/25/2020   COPD (chronic obstructive pulmonary disease) (HCC) 03/10/2013   Tobacco abuse 03/10/2013   Essential hypertension 10/01/2012   Hyperlipidemia 10/01/2012   Chronic pain syndrome 10/01/2012    Social Hx   Social History   Socioeconomic History   Marital status: Widowed    Spouse name: Not on file   Number of children: Not on file   Years of education: Not on file   Highest education level: Not on file  Occupational History   Not on file  Tobacco Use   Smoking status: Every Day    Current packs/day: 1.00    Average packs/day: 1 pack/day for 35.0 years (35.0 ttl pk-yrs)    Types: Cigarettes    Passive  exposure: Current   Smokeless tobacco: Never  Vaping Use   Vaping status: Never Used  Substance and Sexual Activity   Alcohol use: No   Drug use: No   Sexual activity: Not Currently    Birth control/protection: None  Other Topics Concern   Not on file  Social History Narrative   Disabled   Right handed   Social Drivers of Health   Financial Resource Strain: Low Risk  (03/19/2024)   Overall Financial Resource Strain (CARDIA)    Difficulty of Paying Living Expenses: Not hard at all  Food Insecurity: No Food Insecurity (03/19/2024)   Hunger Vital Sign    Worried About Running Out of Food in the Last Year: Never true    Ran Out of Food in the Last Year: Never true  Transportation Needs: No Transportation Needs (03/19/2024)   PRAPARE - Administrator, Civil Service (Medical): No    Lack of Transportation (Non-Medical): No  Physical Activity: Insufficiently Active (03/19/2024)   Exercise Vital Sign    Days of Exercise per Week: 3 days    Minutes of Exercise per Session: 30 min  Stress: No Stress Concern Present (03/19/2024)   [employer] of Occupational Health - Occupational Stress Questionnaire    Feeling of Stress: Not at all  Social Connections: Moderately Isolated (03/19/2024)   Social Connection and Isolation Panel    Frequency of  Communication with Friends and Family: More than three times a week    Frequency of Social Gatherings with Friends and Family: Three times a week    Attends Religious Services: 1 to 4 times per year    Active Member of Clubs or Organizations: No    Attends Banker Meetings: Never    Marital Status: Widowed    Review of Systems Per HPI  Objective:  BP 134/66   Pulse (!) 110   Ht 5' 7 (1.702 m)   Wt 259 lb (117.5 kg)   SpO2 94%   BMI 40.57 kg/m      05/19/2024    8:14 AM 05/13/2024    3:42 PM 04/05/2024    3:12 PM  BP/Weight  Systolic BP 134 138 132  Diastolic BP 66 89 80  Wt. (Lbs) 259 257   BMI 40.57 kg/m2  40.25 kg/m2     Physical Exam Vitals and nursing note reviewed.  Constitutional:      General: He is not in acute distress.    Appearance: Normal appearance. He is obese.  HENT:     Head: Normocephalic and atraumatic.  Cardiovascular:     Rate and Rhythm: Normal rate and regular rhythm.  Pulmonary:     Effort: Pulmonary effort is normal. No respiratory distress.  Musculoskeletal:     Comments: Tenderness over the right lumbar spine.  Neurological:     Mental Status: He is alert.     Lab Results  Component Value Date   WBC 8.6 05/12/2023   HGB 14.4 05/12/2023   HCT 45.0 05/12/2023   PLT 277 05/12/2023   GLUCOSE 158 (H) 11/10/2023   CHOL 155 10/02/2022   TRIG 340 (H) 10/02/2022   HDL 35 (L) 10/02/2022   LDLCALC 52 10/02/2022   ALT 13 11/10/2023   AST 15 11/10/2023   NA 143 11/10/2023   K 4.6 11/10/2023   CL 106 11/10/2023   CREATININE 1.48 (H) 11/10/2023   BUN 24 11/10/2023   CO2 26 11/10/2023   TSH 1.57 11/19/2022   HGBA1C 7.8 (A) 03/16/2024     Assessment & Plan:  Chronic right-sided low back pain with right-sided sciatica Assessment & Plan: X-ray for further evaluation.  Will likely need MRI imaging as well.  Placing empirically on prednisone  and cyclobenzaprine  while awaiting results. Urinary retention raises the concern for cauda equina.  He has no saddle anesthesia at this time.  Orders: -     DG Lumbar Spine Complete  Other orders -     predniSONE ; Take 2 tablets (40 mg total) by mouth daily with breakfast for 5 days.  Dispense: 10 tablet; Refill: 0 -     Cyclobenzaprine  HCl; Take 1 tablet (10 mg total) by mouth 3 (three) times daily as needed for muscle spasms.  Dispense: 30 tablet; Refill: 0    Follow-up: Pending results  K[place]undra Hair DO [facility]

## 2024-05-19 NOTE — Assessment & Plan Note (Signed)
 X-ray for further evaluation.  Will likely need MRI imaging as well.  Placing empirically on prednisone  and cyclobenzaprine  while awaiting results. Urinary retention raises the concern for cauda equina.  He has no saddle anesthesia at this time.

## 2024-05-19 NOTE — Patient Instructions (Addendum)
 Xray at the hospital.  Medication as directed.  Monitor blood sugars closely.

## 2024-05-20 ENCOUNTER — Ambulatory Visit (HOSPITAL_COMMUNITY)
Admission: RE | Admit: 2024-05-20 | Discharge: 2024-05-20 | Disposition: A | Discharge status: Home / Self Care | Source: Ambulatory Visit | Attending: Family Medicine | Admitting: Family Medicine

## 2024-05-20 ENCOUNTER — Ambulatory Visit: Payer: Self-pay | Admitting: Family Medicine

## 2024-05-20 DIAGNOSIS — G8929 Other chronic pain: Secondary | ICD-10-CM | POA: Diagnosis present

## 2024-05-20 DIAGNOSIS — M5441 Lumbago with sciatica, right side: Secondary | ICD-10-CM | POA: Insufficient documentation

## 2024-05-21 ENCOUNTER — Other Ambulatory Visit: Payer: Self-pay

## 2024-05-21 DIAGNOSIS — R9389 Abnormal findings on diagnostic imaging of other specified body structures: Secondary | ICD-10-CM

## 2024-05-21 NOTE — Progress Notes (Signed)
 Referral placed.

## 2024-05-21 NOTE — Progress Notes (Signed)
 Called patient and left voicemail to give us  a call back regarding his MRI results.

## 2024-05-21 NOTE — Progress Notes (Signed)
 Called patient back and spoke with him per Dr. Cricket order. He verbalized about the neurosurgery referral and wanted to go to [facility] in [place])

## 2024-05-23 ENCOUNTER — Other Ambulatory Visit: Payer: Self-pay | Admitting: Family Medicine

## 2024-05-23 DIAGNOSIS — G8929 Other chronic pain: Secondary | ICD-10-CM

## 2024-05-25 MED ORDER — SILODOSIN 8 MG PO CAPS: 8.0000 mg | ORAL_CAPSULE | Freq: Every day | ORAL | 3 refills | Status: AC

## 2024-05-25 NOTE — Telephone Encounter (Signed)
 Called patient to make him awareTry him on rapaflo 8mg  daily  Pt state's he is currently on flomax  0.4 mg BID and alfuzosin  10 mg. Pt want to know if this in another or do the other medication need to be stop. Pt is aware a message will be sent to MD on advisement. Voiced undrstanding

## 2024-06-03 NOTE — Progress Notes (Signed)
 Referring Physician:  Polinsky, Jayce G, DO [address] [place],  [place] [address]  Primary Physician:  Shedrick, Jayce G, DO  History of Present Illness: 06/09/2024 Mr. Shaun Harris is a 58 y.o with a history of daily tobacco use (pack/day), new urinary retention requiring catheter placement. PVD, BPH, UC, DM, CKD stage III, COPD, HTN, HLD and chronic pain on  opioids who is here today with a chief complaint of Chronic right-sided low back pain with right-sided sciatica. He describes the pain as shooting, throbbing in his lower back right side that radiates down his right leg with associated numbness. These symptoms have been going on for a few months without any particular inciting event.  He expresses weakness in both legs but overall feels the symptoms in his right leg are worse. It is worse with walking but nothing seems to make his pain better. He has refused to go to PT as he feels it has not helped him with symptoms in the past and does not want injections as he has had them in other places before and they have worsened his pain.   Of note he has had a foley in place for about 1-2 months for urinary retention.   Conservative measures:  Physical therapy: Has not participated in. Multimodal medical therapy including regular antiinflammatories:  Zanaflex , lidocaine  patch, prednisone , cyclobenzaprine   Injections:  no epidural steroid injections.  Past Surgery: no previous lumbar surgeries   DURWIN DAVISSON has no symptoms of cervical myelopathy.  The symptoms are causing a significant impact on the patient's life.   Review of Systems:  A 10 point review of systems is negative, except for the pertinent positives and negatives detailed in the HPI.  Past Medical History: Past Medical History:  Diagnosis Date   Anxiety    Arthritis    Cerebral vasculitis    Chronic headaches    constant since possible stroke in 1997,Vasculitis of brain with increased pressure of brain .   COPD  (chronic obstructive pulmonary disease) (HCC)    COPD (chronic obstructive pulmonary disease) (HCC)    Diabetes mellitus    Hyperlipidemia    Hypertension    Hypertriglyceridemia    Sleep apnea    has but doesnt use, cannot tolerate; PCP aware   Stroke [facility])    pt states,they arent sure if i had a stroke in 1997, there is still a question about that with my doctors. No deficits.   Tobacco abuse    Tremor 10/02/2022   Ulcerative colitis (HCC)    Ulcerative colitis (HCC)    Venous stasis     Past Surgical History: Past Surgical History:  Procedure Laterality Date   BIOPSY  10/25/2016   Procedure: BIOPSY;  Surgeon: Claudis RAYMOND Rivet, MD;  [facility];  Service: Endoscopy;;  colon   CERVICAL SPINE SURGERY  2008   x2-last surgery 04/24/2016.   COLONOSCOPY N/A 12/30/2023   Procedure: COLONOSCOPY;  Surgeon: Eartha Angelia Sieving, MD;  Location: [facility];  Service: Gastroenterology;  Laterality: N/A;  8:15AM;ASA 3   COLONOSCOPY WITH PROPOFOL  N/A 10/25/2016   Procedure: COLONOSCOPY WITH PROPOFOL ;  Surgeon: Claudis RAYMOND Rivet, MD;  [facility];  Service: Endoscopy;  Laterality: N/A;  7:30   EXTRACORPOREAL SHOCK WAVE LITHOTRIPSY Left 08/08/2020   Procedure: EXTRACORPOREAL SHOCK WAVE LITHOTRIPSY (ESWL);  Surgeon: Sherrilee Belvie CROME, MD;  Location: [facility];  Service: Urology;  Laterality: Left;   KNEE ARTHROSCOPY  02/25/2012   Procedure: ARTHROSCOPY  KNEE;  Surgeon: Lemond Stable, MD;  Location: [facility];  Service: Orthopedics;  Laterality: Right;   KNEE ARTHROSCOPY WITH MEDIAL MENISECTOMY Left 12/23/2014   Procedure: LEFT KNEE ARTHROSCOPY WITH PARTIAL MENISECTOMY;  Surgeon: Lemond Stable, MD;  Location: [facility];  Service: Orthopedics;  Laterality: Left;   POLYPECTOMY  10/25/2016   Procedure: POLYPECTOMY;  Surgeon: Claudis RAYMOND Rivet, MD;  [facility];  Service: Endoscopy;;  colon   SHOULDER ARTHROSCOPY  2004   left shoulder   SHOULDER ARTHROSCOPY  2008   right  shoulder    Allergies: Allergies as of 06/09/2024 - Review Complete 06/09/2024  Allergen Reaction Noted   Gabapentin Other (See Comments) 03/21/2016    Medications: Outpatient Encounter Medications as of 06/09/2024  Medication Sig   acetaminophen  (TYLENOL ) 500 MG tablet Take 1,000 mg by mouth every 6 (six) hours as needed for moderate pain.   AIMOVIG 70 MG/ML SOAJ Inject 70 mg into the skin every 30 (thirty) days.   albuterol  (VENTOLIN  HFA) 108 (90 Base) MCG/ACT inhaler inhale TWO puffs by MOUTH every SIX hours as needed for shortness of breath   alfuzosin  (UROXATRAL ) 10 MG 24 hr tablet Take 1 tablet (10 mg total) by mouth daily.   ALPRAZolam  (XANAX ) 0.5 MG tablet Take 0.5 mg by mouth 3 (three) times daily.   aspirin EC 325 MG tablet Take 325 mg by mouth at bedtime.   atorvastatin  (LIPITOR) 20 MG tablet TAKE ONE TABLET BY MOUTH EVERY DAY   buPROPion  (WELLBUTRIN  SR) 150 MG 12 hr tablet Take 1 tablet (150 mg total) by mouth 2 (two) times daily.   fluticasone -salmeterol (ADVAIR) 250-50 MCG/ACT AEPB inhale ONE PUFF by MOUTH every 12 hours. <<rinse MOUTH AFTER use>>   furosemide  (LASIX ) 20 MG tablet Take 1 tablet (20 mg total) by mouth daily.   glucose blood (TRUE METRIX BLOOD GLUCOSE TEST) test strip check blood sugar FOUR TIMES DAILY   hydrOXYzine  (ATARAX /VISTARIL ) 50 MG tablet Take 50 mg by mouth daily as needed for anxiety.   Insulin  Glargine (BASAGLAR  KWIKPEN) 100 UNIT/ML Inject 50 Units into the skin at bedtime.   Insulin  Pen Needle (PEN NEEDLES) 31G X 8 MM MISC Use to inject insulin  4 times daily   Insulin  Syringe-Needle U-100 (BD VEO INSULIN  SYRINGE U/F) 31G X 15/64 1 ML MISC AS DIRECTED   levETIRAcetam  (KEPPRA ) 500 MG tablet TAKE THREE TABLETS BY MOUTH TWICE DAILY   lidocaine  (LIDODERM ) 5 % Place 1 patch onto the skin daily. Remove & Discard patch within 12 hours or as directed by MD   Mesalamine  (ASACOL ) 400 MG CPDR DR capsule TAKE ONE CAPSULE BY MOUTH FOUR TIMES DAILY   morphine   (MS CONTIN ) 30 MG 12 hr tablet Take 1 tablet (30 mg total) by mouth 2 (two) times daily. May fill 60 days after 09/30/2017   Multiple Vitamins-Minerals (CENTRUM PO) Take 1 tablet by mouth daily.   NOVOLOG  FLEXPEN 100 UNIT/ML FlexPen INJECT 15 UNITS THREE TIMES DAILY WITH MEALS   ondansetron  (ZOFRAN -ODT) 8 MG disintegrating tablet DISSOLVE ONE TABLET BY MOUTH EVERY 8 HOURS AS NEEDED FOR NAUSEA AND VOMITING   Oxycodone  HCl 10 MG TABS Take 1 tablet by mouth 4 times a day as needed for pain. Do not drive or operate machinery while on this medicine.   OZEMPIC , 0.25 OR 0.5 MG/DOSE, 2 MG/3ML SOPN INJECT 0.5MG  SUBCUTANEOUSLY ONCE WEEKLY   polyethylene glycol (MIRALAX ) 17 g packet Take 17 g by mouth daily.   QUEtiapine (SEROQUEL) 100 MG tablet Take 100  mg by mouth at bedtime.   silodosin  (RAPAFLO ) 8 MG CAPS capsule Take 1 capsule (8 mg total) by mouth daily with breakfast.   sodium bicarbonate  650 MG tablet Take 1 tablet (650 mg total) by mouth 2 (two) times daily.   tamsulosin  (FLOMAX ) 0.4 MG CAPS capsule Take 1 capsule (0.4 mg total) by mouth in the morning and at bedtime.   traZODone (DESYREL) 100 MG tablet Take 100 mg by mouth at bedtime.   TRUEPLUS INSULIN  SYRINGE 31G X 5/16 1 ML MISC AS DIRECTED   venlafaxine  XR (EFFEXOR -XR) 37.5 MG 24 hr capsule Take 1 capsule (37.5 mg total) by mouth daily.   vitamin C (ASCORBIC ACID) 500 MG tablet Take 500 mg by mouth daily.   cyclobenzaprine  (FLEXERIL ) 10 MG tablet Take 1 tablet (10 mg total) by mouth 3 (three) times daily as needed for muscle spasms.   [DISCONTINUED] cyclobenzaprine  (FLEXERIL ) 10 MG tablet Take 1 tablet (10 mg total) by mouth 3 (three) times daily as needed for muscle spasms.   [DISCONTINUED] NOVOLOG  FLEXPEN 100 UNIT/ML FlexPen Inject 10-16 Units into the skin 3 (three) times daily with meals.   No facility-administered encounter medications on file as of 06/09/2024.    Social History: Social History   Tobacco Use   Smoking status: Every  Day    Current packs/day: 1.00    Average packs/day: 1 pack/day for 35.0 years (35.0 ttl pk-yrs)    Types: Cigarettes    Passive exposure: Current   Smokeless tobacco: Never  Vaping Use   Vaping status: Never Used  Substance Use Topics   Alcohol use: No   Drug use: No    Family Medical History: Family History  Problem Relation Age of Onset   Hypertension Father    Diabetes Father    Heart disease Father    Parkinson's disease Father    Hypertension Sister    Heart disease Brother        both brothers have heart disease   Heart disease Maternal Grandfather    Lupus Daughter    Neurofibromatosis Son    Parkinson's disease Paternal Aunt    Parkinson's disease Paternal Uncle    Heart attack Other     Physical Examination: Today's Vitals   06/09/24 0955  Weight: 254 lb (115.2 kg)  Height: 5' 7 (1.702 m)  PainSc: 8   PainLoc: Back   Body mass index is 39.78 kg/m.   General: Patient is well developed, well nourished, calm, collected, and in no apparent distress. Attention to examination is appropriate.  Psychiatric: Patient is non-anxious.  Head:  Pupils equal, round, and reactive to light.  ENT:  Oral mucosa appears well hydrated.  Neck:   Supple.    Respiratory: Patient is breathing without any difficulty.  Extremities: Obvious vascular discoloration in bilateral lower extremities.   Vascular: Weak but palpable dorsal pedal pulses.  Skin:   On exposed skin, there are no abnormal skin lesions.  NEUROLOGICAL:     Awake, alert, oriented to person, place, and time.  Speech is clear and fluent. Fund of knowledge is appropriate.   Cranial Nerves: Pupils equal round and reactive to light.  Facial tone is symmetric.  Facial sensation is symmetric.   Strength: Side Biceps Triceps Deltoid Interossei Grip Wrist Ext. Wrist Flex.  R 5 5 5 5 5 5 5   L 5 5 5 5 5 5 5    Side Iliopsoas Quads Hamstring PF DF EHL  R 5 4- 5 5 5  5  L 5 4 5 5 5 5    Reflexes are 1+ and  symmetric at the biceps, triceps, brachioradialis, patella and achilles.   Hoffman's is absent.  Clonus is not present.  Toes are down-going.  Bilateral upper and lower extremity sensation is intact to light touch.    Ambulates with an independent but antalgic gait  Medical Decision Making  Imaging: 05/20/24 MRI lumbar spine FINDINGS:   BONES AND ALIGNMENT: Straightening of the normal lumbar lordosis is present. Normal vertebral body heights. Bone marrow signal is unremarkable.   SPINAL CORD: The conus medullaris terminates at L1.   SOFT TISSUES: No paraspinal mass.   L1-L2: Broad based disc bulging is present without significant stenosis.   L2-L3: The mild broad based disc protrusion is present without significant stenosis.   L3-L4: Broad based disc protrusion and moderate bilateral facet hypertrophy contributes to mild foraminal stenosis bilaterally.   L4-L5: A right paramedian disc extrusion extends superiorly 8 mm narrowing the right lateral recess and potentially impacting the traversing L5 nerve roots. Moderate right and mild left foraminal stenosis is present.   L5-S1: Mild disc bulging and facet hypertrophy is present without focal stenosis.   IMPRESSION: 1. Right paramedian disc extrusion at L4-L5 narrowing the right lateral recess with potential impingement of the traversing right L5 nerve roots, with moderate right and mild left foraminal stenosis, potentially impacting the L4 nerve roots . 2. Mild bilateral foraminal stenosis at L3-L4 due to broad-based disc protrusion and moderate bilateral facet hypertrophy.   Electronically signed by: Lonni Necessary MD 05/20/2024 05:33 PM EST RP Workstation: [id number]  I have personally reviewed the images and agree with the above interpretation.  Assessment and Plan: Mr. Skibinski is a pleasant 58 y.o. male with subacute low back and primarily right radicular pain. I suspect the lateral recess stenosis at L4-5 is  responsible for these symptoms although these findings are largely moderate in nature. We discussed possible treatment options but given his chronic conditions and lack of maximized conservative treatment, I feel it is best for him to pursue other treatment options at this time. I have placed a referral to PT in [place]. Should he complete 6 weeks of PT or be discharged due, he can return to clinic to discuss surgical options with Dr. Claudene or Dr. Clois. I reiterated the importance of smoking cessation as this could effect his candidacy for surgery should it be deemed possibly beneficial. He will contact us  once he has completed therapy should he decide he would like to discuss surgical options. I have provided him with a refill of Flexeril  in the interim. We will otherwise see him going forward on an as needed basis.   Thank you for involving me in the care of this patient.   [facility] [employer] [facility] of Neuro[facility];

## 2024-06-07 ENCOUNTER — Other Ambulatory Visit: Payer: Self-pay | Admitting: Family Medicine

## 2024-06-09 ENCOUNTER — Encounter: Payer: Self-pay | Admitting: Neurosurgery

## 2024-06-09 ENCOUNTER — Ambulatory Visit: Admitting: Neurosurgery

## 2024-06-09 VITALS — Ht 67.0 in | Wt 254.0 lb

## 2024-06-09 DIAGNOSIS — M5416 Radiculopathy, lumbar region: Secondary | ICD-10-CM

## 2024-06-09 MED ORDER — CYCLOBENZAPRINE HCL 10 MG PO TABS: 10.0000 mg | ORAL_TABLET | Freq: Three times a day (TID) | ORAL | 0 refills | Status: DC | PRN

## 2024-06-21 ENCOUNTER — Other Ambulatory Visit: Payer: Self-pay | Admitting: Family Medicine

## 2024-06-21 DIAGNOSIS — E1122 Type 2 diabetes mellitus with diabetic chronic kidney disease: Secondary | ICD-10-CM

## 2024-06-22 ENCOUNTER — Ambulatory Visit

## 2024-06-22 DIAGNOSIS — R339 Retention of urine, unspecified: Secondary | ICD-10-CM

## 2024-06-22 MED ORDER — CIPROFLOXACIN HCL 500 MG PO TABS: 500.0000 mg | ORAL_TABLET | Freq: Once | ORAL | Status: AC

## 2024-06-22 NOTE — Progress Notes (Signed)
 Cath Change/ Replacement  Patient is present today for a catheter change due to urinary retention.  10 ml of water  was removed from the balloon, a 16 FR foley cath was removed without difficulty.  Patient was cleaned and prepped in a sterile fashion with Betadinex3.  A 16  FR foley cath was replaced into the bladder, no complications were noted. Urine return was noted 10 ml and urine was Clear yellow in color. The balloon was filled with 10ml of sterile water . A night bag was attached for drainage.  A night bag was also given to the patient and patient was given instruction on how to change from one bag to another. Patient was given proper instruction on catheter care.    Performed by: Shaun Harris, CMA  Follow up: 4 weeks Cath Change

## 2024-07-16 ENCOUNTER — Ambulatory Visit (INDEPENDENT_AMBULATORY_CARE_PROVIDER_SITE_OTHER): Admitting: Nurse Practitioner

## 2024-07-16 ENCOUNTER — Encounter: Payer: Self-pay | Admitting: Nurse Practitioner

## 2024-07-16 VITALS — BP 122/68 | HR 104 | Ht 67.0 in | Wt 262.6 lb

## 2024-07-16 DIAGNOSIS — Z7985 Long-term (current) use of injectable non-insulin antidiabetic drugs: Secondary | ICD-10-CM | POA: Diagnosis not present

## 2024-07-16 DIAGNOSIS — N183 Chronic kidney disease, stage 3 unspecified: Secondary | ICD-10-CM

## 2024-07-16 DIAGNOSIS — Z794 Long term (current) use of insulin: Secondary | ICD-10-CM

## 2024-07-16 DIAGNOSIS — E1122 Type 2 diabetes mellitus with diabetic chronic kidney disease: Secondary | ICD-10-CM | POA: Diagnosis not present

## 2024-07-16 DIAGNOSIS — E782 Mixed hyperlipidemia: Secondary | ICD-10-CM

## 2024-07-16 LAB — POCT GLYCOSYLATED HEMOGLOBIN (HGB A1C): Hemoglobin A1C: 7.5 % — AB (ref 4.0–5.6)

## 2024-07-16 MED ORDER — BASAGLAR KWIKPEN 100 UNIT/ML ~~LOC~~ SOPN: 40.0000 [IU] | PEN_INJECTOR | Freq: Every day | SUBCUTANEOUS | 3 refills | Status: AC

## 2024-07-16 MED ORDER — PEN NEEDLES 31G X 8 MM MISC: 3 refills | Status: AC

## 2024-07-16 MED ORDER — NOVOLOG FLEXPEN 100 UNIT/ML ~~LOC~~ SOPN: 16.0000 [IU] | PEN_INJECTOR | Freq: Three times a day (TID) | SUBCUTANEOUS | 3 refills | Status: AC

## 2024-07-16 MED ORDER — OZEMPIC (0.25 OR 0.5 MG/DOSE) 2 MG/3ML ~~LOC~~ SOPN: 0.5000 mg | PEN_INJECTOR | SUBCUTANEOUS | 1 refills | Status: AC

## 2024-07-16 NOTE — Progress Notes (Signed)
 "                                                                        Endocrinology Follow Up Note       07/16/2024, 8:23 AM   Subjective:    Patient ID: Shaun Harris, male    DOB: [birth year].  Shaun Harris is being seen in follow up after being seen in consultation for management of currently uncontrolled symptomatic diabetes requested by  Meister, Jayce G, DO.   Past Medical History:  Diagnosis Date   Anxiety    Arthritis    Cerebral vasculitis    Chronic headaches    constant since possible stroke in 1997,Vasculitis of brain with increased pressure of brain .   COPD (chronic obstructive pulmonary disease) (HCC)    COPD (chronic obstructive pulmonary disease) (HCC)    Diabetes mellitus    Hyperlipidemia    Hypertension    Hypertriglyceridemia    Sleep apnea    has but doesnt use, cannot tolerate; PCP aware   Stroke [facility])    pt states,they arent sure if i had a stroke in 1997, there is still a question about that with my doctors. No deficits.   Tobacco abuse    Tremor 10/02/2022   Ulcerative colitis (HCC)    Ulcerative colitis (HCC)    Venous stasis     Past Surgical History:  Procedure Laterality Date   BIOPSY  10/25/2016   Procedure: BIOPSY;  Surgeon: Claudis RAYMOND Rivet, MD;  [facility];  Service: Endoscopy;;  colon   CERVICAL SPINE SURGERY  2008   x2-last surgery 04/24/2016.   COLONOSCOPY N/A 12/30/2023   Procedure: COLONOSCOPY;  Surgeon: Eartha Angelia Sieving, MD;  Location: [facility];  Service: Gastroenterology;  Laterality: N/A;  8:15AM;ASA 3   COLONOSCOPY WITH PROPOFOL  N/A 10/25/2016   Procedure: COLONOSCOPY WITH PROPOFOL ;  Surgeon: Claudis RAYMOND Rivet, MD;  [facility]: [facility];  Service: Endoscopy;  Laterality: N/A;  7:30   EXTRACORPOREAL SHOCK WAVE LITHOTRIPSY Left 08/08/2020   Procedure: EXTRACORPOREAL SHOCK WAVE LITHOTRIPSY (ESWL);  Surgeon: Sherrilee Belvie CROME, MD;  Location: [facility];  Service: Urology;  Laterality: Left;   KNEE ARTHROSCOPY   02/25/2012   Procedure: ARTHROSCOPY KNEE;  Surgeon: Lemond Stable, MD;  Location: [facility];  Service: Orthopedics;  Laterality: Right;   KNEE ARTHROSCOPY WITH MEDIAL MENISECTOMY Left 12/23/2014   Procedure: LEFT KNEE ARTHROSCOPY WITH PARTIAL MENISECTOMY;  Surgeon: Lemond Stable, MD;  Location: [facility];  Service: Orthopedics;  Laterality: Left;   POLYPECTOMY  10/25/2016   Procedure: POLYPECTOMY;  Surgeon: Claudis RAYMOND Rivet, MD;  [facility];  Service: Endoscopy;;  colon   SHOULDER ARTHROSCOPY  2004   left shoulder   SHOULDER ARTHROSCOPY  2008   right shoulder    Social History   Socioeconomic History   Marital status: Widowed    Spouse name: Not on file   Number of children: Not on file   Years of education: Not on file   Highest education level: Not on file  Occupational History   Not on file  Tobacco Use   Smoking status: Every Day    Current packs/day: 1.00    Average packs/day: 1 pack/day  for 35.0 years (35.0 ttl pk-yrs)    Types: Cigarettes    Passive exposure: Current   Smokeless tobacco: Never  Vaping Use   Vaping status: Never Used  Substance and Sexual Activity   Alcohol use: No   Drug use: No   Sexual activity: Not Currently    Birth control/protection: None  Other Topics Concern   Not on file  Social History Narrative   Disabled   Right handed   Social Drivers of Health   Tobacco Use: High Risk (07/16/2024)   Patient History    Smoking Tobacco Use: Every Day    Smokeless Tobacco Use: Never    Passive Exposure: Current  Financial Resource Strain: Low Risk (03/19/2024)   Overall Financial Resource Strain (CARDIA)    Difficulty of Paying Living Expenses: Not hard at all  Food Insecurity: No Food Insecurity (03/19/2024)   Epic    Worried About Radiation Protection Practitioner of Food in the Last Year: Never true    Ran Out of Food in the Last Year: Never true  Transportation Needs: No Transportation Needs (03/19/2024)   Epic    Lack of Transportation (Medical): No    Lack of  Transportation (Non-Medical): No  Physical Activity: Insufficiently Active (03/19/2024)   Exercise Vital Sign    Days of Exercise per Week: 3 days    Minutes of Exercise per Session: 30 min  Stress: No Stress Concern Present (03/19/2024)   [employer] of Occupational Health - Occupational Stress Questionnaire    Feeling of Stress: Not at all  Social Connections: Moderately Isolated (03/19/2024)   Social Connection and Isolation Panel    Frequency of Communication with Friends and Family: More than three times a week    Frequency of Social Gatherings with Friends and Family: Three times a week    Attends Religious Services: 1 to 4 times per year    Active Member of Clubs or Organizations: No    Attends Banker Meetings: Never    Marital Status: Widowed  Depression (PHQ2-9): Medium Risk (05/19/2024)   Depression (PHQ2-9)    PHQ-2 Score: 5  Alcohol Screen: Low Risk (03/19/2024)   Alcohol Screen    Last Alcohol Screening Score (AUDIT): 0  Housing: Unknown (03/19/2024)   Epic    Unable to Pay for Housing in the Last Year: No    Number of Times Moved in the Last Year: Not on file    Homeless in the Last Year: No  Utilities: Not At Risk (03/19/2024)   Epic    Threatened with loss of utilities: No  Health Literacy: Adequate Health Literacy (03/19/2024)   B1300 Health Literacy    Frequency of need for help with medical instructions: Never    Family History  Problem Relation Age of Onset   Hypertension Father    Diabetes Father    Heart disease Father    Parkinson's disease Father    Hypertension Sister    Heart disease Brother        both brothers have heart disease   Heart disease Maternal Grandfather    Lupus Daughter    Neurofibromatosis Son    Parkinson's disease Paternal Aunt    Parkinson's disease Paternal Uncle    Heart attack Other     Outpatient Encounter Medications as of 07/16/2024  Medication Sig   acetaminophen  (TYLENOL ) 500 MG tablet Take 1,000 mg by mouth  every 6 (six) hours as needed for moderate pain.   AIMOVIG 70 MG/ML SOAJ Inject 70  mg into the skin every 30 (thirty) days.   albuterol  (VENTOLIN  HFA) 108 (90 Base) MCG/ACT inhaler inhale TWO puffs by MOUTH every SIX hours as needed for shortness of breath   alfuzosin  (UROXATRAL ) 10 MG 24 hr tablet Take 1 tablet (10 mg total) by mouth daily.   ALPRAZolam  (XANAX ) 0.5 MG tablet Take 0.5 mg by mouth 3 (three) times daily.   aspirin EC 325 MG tablet Take 325 mg by mouth at bedtime.   atorvastatin  (LIPITOR) 20 MG tablet TAKE ONE TABLET BY MOUTH EVERY DAY   buPROPion  (WELLBUTRIN  SR) 150 MG 12 hr tablet Take 1 tablet (150 mg total) by mouth 2 (two) times daily.   candesartan (ATACAND) 4 MG tablet Take 4 mg by mouth daily.   cyclobenzaprine  (FLEXERIL ) 10 MG tablet Take 1 tablet (10 mg total) by mouth 3 (three) times daily as needed for muscle spasms.   fluticasone -salmeterol (ADVAIR) 250-50 MCG/ACT AEPB inhale ONE PUFF by MOUTH every 12 hours. <<rinse MOUTH AFTER use>>   furosemide  (LASIX ) 20 MG tablet Take 1 tablet (20 mg total) by mouth daily.   glucose blood (TRUE METRIX BLOOD GLUCOSE TEST) test strip check blood sugar FOUR TIMES DAILY   Insulin  Syringe-Needle U-100 (BD VEO INSULIN  SYRINGE U/F) 31G X 15/64 1 ML MISC AS DIRECTED   levETIRAcetam  (KEPPRA ) 500 MG tablet TAKE THREE TABLETS BY MOUTH TWICE DAILY   lidocaine  (LIDODERM ) 5 % Place 1 patch onto the skin daily. Remove & Discard patch within 12 hours or as directed by MD   Mesalamine  (ASACOL ) 400 MG CPDR DR capsule TAKE ONE CAPSULE BY MOUTH FOUR TIMES DAILY   morphine  (MS CONTIN ) 30 MG 12 hr tablet Take 1 tablet (30 mg total) by mouth 2 (two) times daily. May fill 60 days after 09/30/2017   Multiple Vitamins-Minerals (CENTRUM PO) Take 1 tablet by mouth daily.   ondansetron  (ZOFRAN -ODT) 8 MG disintegrating tablet DISSOLVE ONE TABLET BY MOUTH EVERY 8 HOURS AS NEEDED FOR NAUSEA AND VOMITING   Oxycodone  HCl 10 MG TABS Take 1 tablet by mouth 4 times a  day as needed for pain. Do not drive or operate machinery while on this medicine.   polyethylene glycol (MIRALAX ) 17 g packet Take 17 g by mouth daily.   QUEtiapine (SEROQUEL) 100 MG tablet Take 100 mg by mouth at bedtime.   silodosin  (RAPAFLO ) 8 MG CAPS capsule Take 1 capsule (8 mg total) by mouth daily with breakfast.   sodium bicarbonate  650 MG tablet Take 1 tablet (650 mg total) by mouth 2 (two) times daily.   tamsulosin  (FLOMAX ) 0.4 MG CAPS capsule Take 1 capsule (0.4 mg total) by mouth in the morning and at bedtime.   traZODone (DESYREL) 100 MG tablet Take 100 mg by mouth at bedtime.   TRUEPLUS INSULIN  SYRINGE 31G X 5/16 1 ML MISC AS DIRECTED   venlafaxine  XR (EFFEXOR -XR) 37.5 MG 24 hr capsule Take 1 capsule (37.5 mg total) by mouth daily.   vitamin C (ASCORBIC ACID) 500 MG tablet Take 500 mg by mouth daily.   [DISCONTINUED] Insulin  Glargine (BASAGLAR  KWIKPEN) 100 UNIT/ML Inject 50 Units into the skin at bedtime. (Patient taking differently: Inject 40 Units into the skin at bedtime.)   [DISCONTINUED] Insulin  Pen Needle (PEN NEEDLES) 31G X 8 MM MISC Use to inject insulin  4 times daily   [DISCONTINUED] NOVOLOG  FLEXPEN 100 UNIT/ML FlexPen INJECT 15 UNITS THREE TIMES DAILY WITH MEALS (Patient taking differently: 10-16 Units 3 (three) times daily with meals.)   [DISCONTINUED] OZEMPIC ,  0.25 OR 0.5 MG/DOSE, 2 MG/3ML SOPN INJECT 0.5MG  SUBCUTANEOUSLY ONCE WEEKLY   hydrOXYzine  (ATARAX /VISTARIL ) 50 MG tablet Take 50 mg by mouth daily as needed for anxiety.   insulin  aspart (NOVOLOG  FLEXPEN) 100 UNIT/ML FlexPen Inject 16 Units into the skin 3 (three) times daily with meals.   Insulin  Glargine (BASAGLAR  KWIKPEN) 100 UNIT/ML Inject 40 Units into the skin at bedtime.   Insulin  Pen Needle (PEN NEEDLES) 31G X 8 MM MISC Use to inject insulin  4 times daily   Semaglutide ,0.25 or 0.5MG /DOS, (OZEMPIC , 0.25 OR 0.5 MG/DOSE,) 2 MG/3ML SOPN Inject 0.5 mg into the skin once a week.   Facility-Administered Encounter  Medications as of 07/16/2024  Medication   ciprofloxacin  (CIPRO ) tablet 500 mg    ALLERGIES: Allergies  Allergen Reactions   Gabapentin Other (See Comments)    Increased blood sugar    VACCINATION STATUS: Immunization History  Administered Date(s) Administered   Influenza Split 04/01/2013   Influenza, Seasonal, Injecte, Preservative Fre 04/04/2023, 04/05/2024   Influenza,inj,Quad PF,6+ Mos 03/30/2014, 03/22/2015, 03/21/2016, 04/07/2017, 03/30/2018, 07/12/2020, 07/02/2021, 07/03/2022   Influenza-Unspecified 04/14/2015, 03/30/2018, 04/22/2019   Pneumococcal Polysaccharide-23 07/02/2018   Pneumococcal-Unspecified 06/26/2004    Diabetes He presents for his follow-up diabetic visit. He has type 2 diabetes mellitus. Onset time: diagnosed at approx age of 58. His disease course has been stable. There are no hypoglycemic associated symptoms. Associated symptoms include blurred vision and fatigue. There are no hypoglycemic complications. Symptoms are stable. Diabetic complications include a CVA, heart disease, nephropathy and peripheral neuropathy. Risk factors for coronary artery disease include tobacco exposure, sedentary lifestyle, male sex, obesity, hypertension, diabetes mellitus, dyslipidemia and family history. Current diabetic treatment includes intensive insulin  program (and Ozempic ). He is compliant with treatment most of the time. His weight is fluctuating minimally. He is following a generally unhealthy diet. When asked about meal planning, he reported none. He has not had a previous visit with a dietitian. He rarely participates in exercise. His home blood glucose trend is fluctuating minimally. His overall blood glucose range is 140-180 mg/dl. (He presents today with his meter and logs showing mostly at goal glycemic profile.  His POCT A1c today is 7.5%, improving from last visit of 7.8%.   Analysis of his meter shows 7-day average of 166, 14-day average of 158, 30-day average of 154.  He  did have steroids for back pain between visits.  He has glucose ranging between 58-262 in his meter.  He still has urinary bag- thinks they are going to make it permanent.) An ACE inhibitor/angiotensin II receptor blocker is being taken. He does not see a podiatrist (saw in distant past).Eye exam is not current.     Review of systems  Constitutional: + minimally fluctuating body weight, current Body mass index is 41.13 kg/m. , no fatigue, no subjective hyperthermia, no subjective hypothermia Eyes: no blurry vision, no xerophthalmia ENT: no sore throat, no nodules palpated in throat, no dysphagia/odynophagia, no hoarseness Cardiovascular: no chest pain, no shortness of breath, no palpitations Respiratory: no cough, no shortness of breath Gastrointestinal: no nausea/vomiting/diarrhea, + mild intermittent constipation (worse on higher dose of Ozempic ) Musculoskeletal: no muscle/joint aches Skin: no rashes, no hyperemia Neurological: no tremors, no numbness, no tingling, no dizziness Psychiatric: no depression, no anxiety  Objective:     BP 122/68 (BP Location: Left Arm, Patient Position: Sitting, Cuff Size: Large)   Pulse (!) 104   Ht 5' 7 (1.702 m)   Wt 262 lb 9.6 oz (119.1 kg)   BMI 41.13  kg/m   Wt Readings from Last 3 Encounters:  07/16/24 262 lb 9.6 oz (119.1 kg)  06/09/24 254 lb (115.2 kg)  05/19/24 259 lb (117.5 kg)     BP Readings from Last 3 Encounters:  07/16/24 122/68  05/19/24 134/66  05/13/24 138/89     Physical Exam- Limited  Constitutional:  Body mass index is 41.13 kg/m. , not in acute distress, normal state of mind Eyes:  EOMI, no exophthalmos Musculoskeletal: no gross deformities, strength intact in all four extremities, no gross restriction of joint movements Skin:  no rashes, + hyperemia to BLE, nicotinic discoloration to bilateral fingertips Neurological: no tremor with outstretched hands   Diabetic Foot Exam - Simple   No data filed     CMP (  most recent) CMP     Component Value Date/Time   NA 143 11/10/2023 1131   NA 141 04/16/2023 0900   K 4.6 11/10/2023 1131   CL 106 11/10/2023 1131   CO2 26 11/10/2023 1131   GLUCOSE 158 (H) 11/10/2023 1131   BUN 24 11/10/2023 1131   BUN 26 (H) 04/16/2023 0900   CREATININE 1.48 (H) 11/10/2023 1131   CALCIUM  9.3 11/10/2023 1131   CALCIUM  9.3 08/27/2023 1140   PROT 6.9 11/10/2023 1131   PROT 6.4 10/01/2021 1004   ALBUMIN 4.4 11/15/2022 0636   ALBUMIN 4.6 10/01/2021 1004   AST 15 11/10/2023 1131   ALT 13 11/10/2023 1131   ALKPHOS 82 11/15/2022 0636   BILITOT 0.3 11/10/2023 1131   BILITOT <0.2 10/01/2021 1004   GFRNONAA 50 (L) 11/15/2022 0636   GFRAA 98 03/28/2020 0939     Diabetic Labs (most recent): Lab Results  Component Value Date   HGBA1C 7.5 (A) 07/16/2024   HGBA1C 7.8 (A) 03/16/2024   HGBA1C 8.3 (A) 11/13/2023     Lipid Panel ( most recent) Lipid Panel     Component Value Date/Time   CHOL 155 10/02/2022 1139   CHOL 142 01/01/2022 1157   TRIG 340 (H) 10/02/2022 1139   HDL 35 (L) 10/02/2022 1139   HDL 45 01/01/2022 1157   CHOLHDL 4.4 10/02/2022 1139   VLDL 68 (H) 10/02/2022 1139   LDLCALC 52 10/02/2022 1139   LDLCALC 75 01/01/2022 1157   LABVLDL 22 01/01/2022 1157      Lab Results  Component Value Date   TSH 1.57 11/19/2022           Assessment & Plan:   1) Type 2 diabetes mellitus with hyperglycemia, with long-term current use of insulin   He presents today with his meter and logs showing mostly at goal glycemic profile.  His POCT A1c today is 7.5%, improving from last visit of 7.8%.   Analysis of his meter shows 7-day average of 166, 14-day average of 158, 30-day average of 154.  He did have steroids for back pain between visits.  He has glucose ranging between 58-262 in his meter.  He still has urinary bag- thinks they are going to make it permanent.  - Shaun Harris has currently uncontrolled symptomatic type 2 DM since 59 years of age.    -Recent labs reviewed.    - I had a long discussion with him about the progressive nature of diabetes and the pathology behind its complications. -his diabetes is complicated by CKD stage 3a, ?CVA, tobacco abuse and he remains at a high risk for more acute and chronic complications which include CAD, CVA, CKD, retinopathy, and neuropathy. These are all discussed in detail  with him.  The following Lifestyle Medicine recommendations according to American College of Lifestyle Medicine [facility]) were discussed and offered to patient and he agrees to start the journey:  A. Whole Foods, Plant-based plate comprising of fruits and vegetables, plant-based proteins, whole-grain carbohydrates was discussed in detail with the patient.   A list for source of those nutrients were also provided to the patient.  Patient will use only water or unsweetened tea for hydration. B.  The need to stay away from risky substances including alcohol, smoking; obtaining 7 to 9 hours of restorative sleep, at least 150 minutes of moderate intensity exercise weekly, the importance of healthy social connections,  and stress reduction techniques were discussed. C.  A full color page of  Calorie density of various food groups per pound showing examples of each food groups was provided to the patient.  - Nutritional counseling repeated/built upon at each appointment.  - The patient admits there is a room for improvement in their diet and drink choices. -  Suggestion is made for the patient to avoid simple carbohydrates from their diet including Cakes, Sweet Desserts / Pastries, Ice Cream, Soda (diet and regular), Sweet Tea, Candies, Chips, Cookies, Sweet Pastries, Store Bought Juices, Alcohol in Excess of 1-2 drinks a day, Artificial Sweeteners, Coffee Creamer, and Sugar-free Products. This will help patient to have stable blood glucose profile and potentially avoid unintended weight gain.   - I encouraged the patient to switch to  unprocessed or minimally processed complex starch and increased protein intake (animal or plant source), fruits, and vegetables.   - Patient is advised to stick to a routine mealtimes to eat 3 meals a day and avoid unnecessary snacks (to snack only to correct hypoglycemia).  - I have approached him with the following individualized plan to manage his diabetes and patient agrees:   -He is advised to continue his Basaglar  (insurance pref) 40 units SQ nightly and continue his Novolog  10-16 units TID with meals if glucose is above 90 and he is eating (Specific instructions on how to titrate insulin  dosage based on glucose readings given to patient in writing).   He can continue Ozempic  0.5 mg SQ weekly as he has tolerated this lower dose well without significant constipation.  Would advise AGAINST increasing his Ozempid dose as he is heavy smoker which increases his risk of pancreatitis on the GLP1 products.  -he is encouraged to continue monitoring glucose 4 times daily, before meals and before bed, and to call the clinic if he has readings less than 70 or above 300 for 3 tests in a row.  We did talk about possibility of adding CGM in future, he is not interested at this time.  - he is warned not to take insulin  without proper monitoring per orders. - Adjustment parameters are given to him for hypo and hyperglycemia in writing.  - Specific targets for  A1c; LDL, HDL, and Triglycerides were discussed with the patient.  2) Blood Pressure /Hypertension:  his blood pressure is controlled to target, tight today.   he is advised to continue his current medications as prescribed by PCP/nephrologist.  3) Lipids/Hyperlipidemia:    Review of his recent lipid panel from 10/03/22 showed controlled LDL at 52 and significantly elevated triglycerides of 340 .  he is advised to continue Lipitor 20 mg daily at bedtime.  Side effects and precautions discussed with him.  We also discussed importance of low fat diet  today.  Will recheck lipid panel prior to  next visit.  4)  Weight/Diet:  his Body mass index is 41.13 kg/m.  -  clearly complicating his diabetes care.   he is a candidate for weight loss. I discussed with him the fact that loss of 5 - 10% of his  current body weight will have the most impact on his diabetes management.  Exercise, and detailed carbohydrates information provided  -  detailed on discharge instructions.  5) Chronic Care/Health Maintenance: -he is on ACEI/ARB and Statin medications and is encouraged to initiate and continue to follow up with Ophthalmology, Dentist, Podiatrist at least yearly or according to recommendations, and advised to QUIT SMOKING. I have recommended yearly flu vaccine and pneumonia vaccine at least every 5 years; moderate intensity exercise for up to 150 minutes weekly; and sleep for at least 7 hours a day.  - he is advised to maintain close follow up with Weick, Jayce G, DO for primary care needs, as well as his other providers for optimal and coordinated care.     I spent  32  minutes in the care of the patient today including review of labs from CMP, Lipids, Thyroid  Function, Hematology (current and previous including abstractions from other facilities); face-to-face time discussing  his blood glucose readings/logs, discussing hypoglycemia and hyperglycemia episodes and symptoms, medications doses, his options of short and long term treatment based on the latest standards of care / guidelines;  discussion about incorporating lifestyle medicine;  and documenting the encounter. Risk reduction counseling performed per USPSTF guidelines to reduce obesity and cardiovascular risk factors.     Please refer to Patient Instructions for Blood Glucose Monitoring and Insulin /Medications Dosing Guide  in media tab for additional information. Please  also refer to  Patient Self Inventory in the Media  tab for reviewed elements of pertinent patient history.  Debby KANDICE Ahle  participated in the discussions, expressed understanding, and voiced agreement with the above plans.  All questions were answered to his satisfaction. he is encouraged to contact clinic should he have any questions or concerns prior to his return visit.      Follow up plan: - Return in about 4 months (around 11/13/2024) for Diabetes F/U with A1c in office, Previsit labs, Bring meter and logs.   Benton Rio, [facility] [address] [place], [place] [address] Phone: [phone] Fax: [phone]  07/16/2024, 8:23 AM    "

## 2024-07-19 ENCOUNTER — Ambulatory Visit: Admitting: Family Medicine

## 2024-07-19 ENCOUNTER — Other Ambulatory Visit: Payer: Self-pay | Admitting: Family Medicine

## 2024-07-19 VITALS — BP 141/79 | HR 100 | Temp 97.9°F | Ht 67.0 in | Wt 256.0 lb

## 2024-07-19 DIAGNOSIS — J441 Chronic obstructive pulmonary disease with (acute) exacerbation: Secondary | ICD-10-CM | POA: Insufficient documentation

## 2024-07-19 DIAGNOSIS — F411 Generalized anxiety disorder: Secondary | ICD-10-CM

## 2024-07-19 MED ORDER — CYCLOBENZAPRINE HCL 10 MG PO TABS: 10.0000 mg | ORAL_TABLET | Freq: Three times a day (TID) | ORAL | 3 refills | Status: AC | PRN

## 2024-07-19 MED ORDER — PREDNISONE 20 MG PO TABS: 40.0000 mg | ORAL_TABLET | Freq: Every day | ORAL | 0 refills | Status: AC

## 2024-07-19 MED ORDER — DOXYCYCLINE HYCLATE 100 MG PO TABS: 100.0000 mg | ORAL_TABLET | Freq: Two times a day (BID) | ORAL | 0 refills | Status: AC

## 2024-07-19 NOTE — Progress Notes (Signed)
 "  Subjective:  Patient ID: Shaun Harris, male    DOB: Jun 01, 1966  Age: 59 y.o. MRN: [id number]  CC:   Chief Complaint  Patient presents with   cough chest congestion, laryngitis most dry cough    3 or more weeks    HPI:  59 year old male extensive past medical history presents with respiratory symptoms.  Patient has known COPD.  Continues to smoke.  He reports that he has been having respiratory symptoms for about 3 weeks.  He reports cough and chest tightness.  He states that he cannot seem to get this to resolve.  He is compliant with his home medication.  Shortness of breath is at baseline.  No documented fever.  Patient Active Problem List   Diagnosis Date Noted   COPD exacerbation (HCC) 07/19/2024   Chronic right-sided low back pain with right-sided sciatica 05/13/2024   Long-term current use of mesalamine  05/12/2023   Migraine 04/04/2022   BPH (benign prostatic hyperplasia) 01/01/2022   Ulcerative colitis (HCC) 07/02/2021   Type 2 diabetes mellitus with diabetic chronic kidney disease (HCC) 07/02/2021   History of seizure 03/14/2021   Stage 3 chronic kidney disease (HCC) 02/06/2021   Nephrolithiasis 07/25/2020   COPD (chronic obstructive pulmonary disease) (HCC) 03/10/2013   Tobacco abuse 03/10/2013   Essential hypertension 10/01/2012   Hyperlipidemia 10/01/2012   Chronic pain syndrome 10/01/2012    Social Hx   Social History   Socioeconomic History   Marital status: Widowed    Spouse name: Not on file   Number of children: Not on file   Years of education: Not on file   Highest education level: Not on file  Occupational History   Not on file  Tobacco Use   Smoking status: Every Day    Current packs/day: 1.00    Average packs/day: 1 pack/day for 35.0 years (35.0 ttl pk-yrs)    Types: Cigarettes    Passive exposure: Current   Smokeless tobacco: Never  Vaping Use   Vaping status: Never Used  Substance and Sexual Activity   Alcohol use: No   Drug use: No    Sexual activity: Not Currently    Birth control/protection: None  Other Topics Concern   Not on file  Social History Narrative   Disabled   Right handed   Social Drivers of Health   Tobacco Use: High Risk (07/16/2024)   Patient History    Smoking Tobacco Use: Every Day    Smokeless Tobacco Use: Never    Passive Exposure: Current  Financial Resource Strain: Low Risk (03/19/2024)   Overall Financial Resource Strain (CARDIA)    Difficulty of Paying Living Expenses: Not hard at all  Food Insecurity: No Food Insecurity (03/19/2024)   Epic    Worried About Radiation Protection Practitioner of Food in the Last Year: Never true    Ran Out of Food in the Last Year: Never true  Transportation Needs: No Transportation Needs (03/19/2024)   Epic    Lack of Transportation (Medical): No    Lack of Transportation (Non-Medical): No  Physical Activity: Insufficiently Active (03/19/2024)   Exercise Vital Sign    Days of Exercise per Week: 3 days    Minutes of Exercise per Session: 30 min  Stress: No Stress Concern Present (03/19/2024)   [employer] of Occupational Health - Occupational Stress Questionnaire    Feeling of Stress: Not at all  Social Connections: Moderately Isolated (03/19/2024)   Social Connection and Isolation Panel    Frequency of Communication  with Friends and Family: More than three times a week    Frequency of Social Gatherings with Friends and Family: Three times a week    Attends Religious Services: 1 to 4 times per year    Active Member of Clubs or Organizations: No    Attends Banker Meetings: Never    Marital Status: Widowed  Depression (PHQ2-9): Medium Risk (05/19/2024)   Depression (PHQ2-9)    PHQ-2 Score: 5  Alcohol Screen: Low Risk (03/19/2024)   Alcohol Screen    Last Alcohol Screening Score (AUDIT): 0  Housing: Unknown (03/19/2024)   Epic    Unable to Pay for Housing in the Last Year: No    Number of Times Moved in the Last Year: Not on file    Homeless in the Last Year: No   Utilities: Not At Risk (03/19/2024)   Epic    Threatened with loss of utilities: No  Health Literacy: Adequate Health Literacy (03/19/2024)   B1300 Health Literacy    Frequency of need for help with medical instructions: Never    Review of Systems Per HPI  Objective:  BP (!) 141/79   Pulse 100   Temp 97.9 F (36.6 C)   Ht 5' 7 (1.702 m)   Wt 256 lb (116.1 kg)   SpO2 95%   BMI 40.10 kg/m      07/19/2024   10:05 AM 07/16/2024    7:59 AM 06/09/2024    9:55 AM  BP/Weight  Systolic BP 141 122   Diastolic BP 79 68   Wt. (Lbs) 256 262.6 254  BMI 40.1 kg/m2 41.13 kg/m2 39.78 kg/m2    Physical Exam Vitals and nursing note reviewed.  Constitutional:      General: He is not in acute distress.    Appearance: Normal appearance. He is obese.  HENT:     Head: Normocephalic and atraumatic.     Nose: Nose normal.  Cardiovascular:     Rate and Rhythm: Normal rate and regular rhythm.  Pulmonary:     Effort: Pulmonary effort is normal.     Breath sounds: Wheezing present.  Neurological:     Mental Status: He is alert.     Lab Results  Component Value Date   WBC 8.6 05/12/2023   HGB 14.4 05/12/2023   HCT 45.0 05/12/2023   PLT 277 05/12/2023   GLUCOSE 158 (H) 11/10/2023   CHOL 155 10/02/2022   TRIG 340 (H) 10/02/2022   HDL 35 (L) 10/02/2022   LDLCALC 52 10/02/2022   ALT 13 11/10/2023   AST 15 11/10/2023   NA 143 11/10/2023   K 4.6 11/10/2023   CL 106 11/10/2023   CREATININE 1.48 (H) 11/10/2023   BUN 24 11/10/2023   CO2 26 11/10/2023   TSH 1.57 11/19/2022   HGBA1C 7.5 (A) 07/16/2024     Assessment & Plan:  COPD exacerbation (HCC) Assessment & Plan: Treating with prednisone  and doxycycline .  Orders: -     predniSONE ; Take 2 tablets (40 mg total) by mouth daily with breakfast for 5 days.  Dispense: 10 tablet; Refill: 0 -     Doxycycline  Hyclate; Take 1 tablet (100 mg total) by mouth 2 (two) times daily.  Dispense: 20 tablet; Refill: 0  Other orders -      Cyclobenzaprine  HCl; Take 1 tablet (10 mg total) by mouth 3 (three) times daily as needed for muscle spasms.  Dispense: 90 tablet; Refill: 3    Follow-up: As previously scheduled  [name]  Bluford DO [facility] "

## 2024-07-19 NOTE — Patient Instructions (Addendum)
 Medication as prescribed.  Call and ask to see Dr. Clois.   Take care  Dr. Bluford

## 2024-07-19 NOTE — Assessment & Plan Note (Signed)
 Treating with prednisone  and doxycycline .

## 2024-07-23 ENCOUNTER — Ambulatory Visit

## 2024-07-23 DIAGNOSIS — R339 Retention of urine, unspecified: Secondary | ICD-10-CM | POA: Diagnosis not present

## 2024-07-23 MED ORDER — CIPROFLOXACIN HCL 500 MG PO TABS
500.0000 mg | ORAL_TABLET | Freq: Once | ORAL | Status: AC
  Administered 2024-07-23: 500 mg via ORAL

## 2024-07-23 NOTE — Progress Notes (Signed)
 Cath Change/ Replacement  Patient is present today for a catheter change due to urinary retention.  10 ml of water was removed from the balloon, a 16FR foley cath was removed without difficulty.  Patient was cleaned and prepped in a sterile fashion with [id number].  A 16 FR foley cath was replaced into the bladder, no complications were noted. Urine return was noted 20ml and urine was Dark yellow in color. The balloon was filled with 10ml of sterile water. A bed bag was attached for drainage.  A night bag was also given to the patient and patient was given instruction on how to change from one bag to another. Patient was given proper instruction on catheter care.    Performed by: Exie DASEN. CMA  Follow up: 4 weeks

## 2024-07-24 LAB — COMPREHENSIVE METABOLIC PANEL WITH GFR
ALT: 12 IU/L (ref 0–44)
AST: 16 IU/L (ref 0–40)
Albumin: 4.6 g/dL (ref 3.8–4.9)
Alkaline Phosphatase: 105 IU/L (ref 47–123)
BUN/Creatinine Ratio: 26 — ABNORMAL HIGH (ref 9–20)
BUN: 37 mg/dL — ABNORMAL HIGH (ref 6–24)
Bilirubin Total: 0.2 mg/dL (ref 0.0–1.2)
CO2: 26 mmol/L (ref 20–29)
Calcium: 9.7 mg/dL (ref 8.7–10.2)
Chloride: 101 mmol/L (ref 96–106)
Creatinine, Ser: 1.41 mg/dL — ABNORMAL HIGH (ref 0.76–1.27)
Globulin, Total: 2.3 g/dL (ref 1.5–4.5)
Glucose: 103 mg/dL — ABNORMAL HIGH (ref 70–99)
Potassium: 4.4 mmol/L (ref 3.5–5.2)
Sodium: 141 mmol/L (ref 134–144)
Total Protein: 6.9 g/dL (ref 6.0–8.5)
eGFR: 58 mL/min/1.73 — ABNORMAL LOW

## 2024-07-24 LAB — VITAMIN D 25 HYDROXY (VIT D DEFICIENCY, FRACTURES): Vit D, 25-Hydroxy: 36.4 ng/mL (ref 30.0–100.0)

## 2024-07-24 LAB — T4, FREE: Free T4: 0.95 ng/dL (ref 0.82–1.77)

## 2024-07-24 LAB — LIPID PANEL
Chol/HDL Ratio: 2.8 ratio (ref 0.0–5.0)
Cholesterol, Total: 171 mg/dL (ref 100–199)
HDL: 62 mg/dL
LDL Chol Calc (NIH): 73 mg/dL (ref 0–99)
Triglycerides: 218 mg/dL — ABNORMAL HIGH (ref 0–149)
VLDL Cholesterol Cal: 36 mg/dL (ref 5–40)

## 2024-07-24 LAB — TSH: TSH: 1.57 u[IU]/mL (ref 0.450–4.500)

## 2024-07-26 ENCOUNTER — Ambulatory Visit: Payer: Self-pay | Admitting: Nurse Practitioner

## 2024-08-27 ENCOUNTER — Ambulatory Visit

## 2024-10-01 ENCOUNTER — Ambulatory Visit (HOSPITAL_COMMUNITY)

## 2024-10-04 ENCOUNTER — Ambulatory Visit: Admitting: Family Medicine

## 2024-10-13 ENCOUNTER — Ambulatory Visit: Admitting: Urology

## 2024-11-15 ENCOUNTER — Ambulatory Visit: Admitting: Nurse Practitioner

## 2025-03-25 ENCOUNTER — Ambulatory Visit
